# Patient Record
Sex: Male | Born: 1945
Health system: Southern US, Community
[De-identification: ages and names within clinical notes are randomized; demographics above are authoritative.]

## PROBLEM LIST (undated history)

## (undated) DIAGNOSIS — R0600 Dyspnea, unspecified: Secondary | ICD-10-CM

## (undated) DIAGNOSIS — F4321 Adjustment disorder with depressed mood: Secondary | ICD-10-CM

## (undated) DIAGNOSIS — F17200 Nicotine dependence, unspecified, uncomplicated: Secondary | ICD-10-CM

## (undated) DIAGNOSIS — F329 Major depressive disorder, single episode, unspecified: Secondary | ICD-10-CM

## (undated) DIAGNOSIS — R0609 Other forms of dyspnea: Secondary | ICD-10-CM

## (undated) DIAGNOSIS — I714 Abdominal aortic aneurysm, without rupture, unspecified: Secondary | ICD-10-CM

## (undated) DIAGNOSIS — F32A Depression, unspecified: Secondary | ICD-10-CM

## (undated) DIAGNOSIS — J45909 Unspecified asthma, uncomplicated: Secondary | ICD-10-CM

## (undated) DIAGNOSIS — E785 Hyperlipidemia, unspecified: Secondary | ICD-10-CM

## (undated) DIAGNOSIS — I219 Acute myocardial infarction, unspecified: Secondary | ICD-10-CM

## (undated) DIAGNOSIS — M1990 Unspecified osteoarthritis, unspecified site: Secondary | ICD-10-CM

## (undated) DIAGNOSIS — I2510 Atherosclerotic heart disease of native coronary artery without angina pectoris: Secondary | ICD-10-CM

## (undated) DIAGNOSIS — K635 Polyp of colon: Secondary | ICD-10-CM

## (undated) DIAGNOSIS — I10 Essential (primary) hypertension: Secondary | ICD-10-CM

## (undated) DIAGNOSIS — F528 Other sexual dysfunction not due to a substance or known physiological condition: Secondary | ICD-10-CM

## (undated) HISTORY — DX: Polyp of colon: K63.5

## (undated) HISTORY — DX: Hyperlipidemia, unspecified: E78.5

## (undated) HISTORY — DX: Other sexual dysfunction not due to a substance or known physiological condition: F52.8

## (undated) HISTORY — DX: Other forms of dyspnea: R06.09

## (undated) HISTORY — DX: Unspecified asthma, uncomplicated: J45.909

## (undated) HISTORY — DX: Major depressive disorder, single episode, unspecified: F32.9

## (undated) HISTORY — DX: Adjustment disorder with depressed mood: F43.21

## (undated) HISTORY — DX: Abdominal aortic aneurysm, without rupture, unspecified: I71.40

## (undated) HISTORY — DX: Nicotine dependence, unspecified, uncomplicated: F17.200

## (undated) HISTORY — DX: Abdominal aortic aneurysm, without rupture: I71.4

## (undated) HISTORY — DX: Atherosclerotic heart disease of native coronary artery without angina pectoris: I25.10

## (undated) HISTORY — DX: Dyspnea, unspecified: R06.00

## (undated) HISTORY — DX: Essential (primary) hypertension: I10

## (undated) HISTORY — DX: Depression, unspecified: F32.A

## (undated) HISTORY — PX: COLONOSCOPY: SHX174

---

## 1983-11-16 HISTORY — PX: INGUINAL HERNIA REPAIR: SHX194

## 1989-11-15 HISTORY — PX: CORONARY ARTERY BYPASS GRAFT: SHX141

## 2004-11-15 ENCOUNTER — Encounter: Payer: Self-pay | Admitting: Family Medicine

## 2004-11-15 LAB — FECAL OCCULT BLOOD, GUAIAC: Fecal Occult Blood: NEGATIVE

## 2004-11-15 LAB — CONVERTED CEMR LAB

## 2005-05-21 ENCOUNTER — Ambulatory Visit: Payer: Self-pay | Admitting: Family Medicine

## 2005-05-28 ENCOUNTER — Ambulatory Visit: Payer: Self-pay | Admitting: Internal Medicine

## 2005-07-16 ENCOUNTER — Ambulatory Visit: Payer: Self-pay | Admitting: Family Medicine

## 2005-08-24 ENCOUNTER — Ambulatory Visit: Payer: Self-pay | Admitting: Family Medicine

## 2005-09-18 ENCOUNTER — Ambulatory Visit: Payer: Self-pay | Admitting: Family Medicine

## 2005-09-28 ENCOUNTER — Ambulatory Visit: Payer: Self-pay | Admitting: Family Medicine

## 2006-08-01 ENCOUNTER — Ambulatory Visit: Payer: Self-pay | Admitting: Family Medicine

## 2006-08-08 ENCOUNTER — Ambulatory Visit: Payer: Self-pay | Admitting: Internal Medicine

## 2006-08-18 ENCOUNTER — Ambulatory Visit: Payer: Self-pay | Admitting: Family Medicine

## 2006-08-25 ENCOUNTER — Ambulatory Visit: Payer: Self-pay | Admitting: Family Medicine

## 2007-05-16 ENCOUNTER — Ambulatory Visit: Payer: Self-pay | Admitting: Family Medicine

## 2007-07-10 ENCOUNTER — Encounter: Payer: Self-pay | Admitting: Family Medicine

## 2007-07-10 DIAGNOSIS — F528 Other sexual dysfunction not due to a substance or known physiological condition: Secondary | ICD-10-CM

## 2007-07-10 DIAGNOSIS — I10 Essential (primary) hypertension: Secondary | ICD-10-CM

## 2007-07-10 DIAGNOSIS — I2510 Atherosclerotic heart disease of native coronary artery without angina pectoris: Secondary | ICD-10-CM

## 2007-07-10 DIAGNOSIS — E785 Hyperlipidemia, unspecified: Secondary | ICD-10-CM

## 2007-07-10 HISTORY — DX: Atherosclerotic heart disease of native coronary artery without angina pectoris: I25.10

## 2007-07-10 HISTORY — DX: Other sexual dysfunction not due to a substance or known physiological condition: F52.8

## 2007-07-10 HISTORY — DX: Essential (primary) hypertension: I10

## 2007-07-10 HISTORY — DX: Hyperlipidemia, unspecified: E78.5

## 2007-08-14 ENCOUNTER — Ambulatory Visit: Payer: Self-pay | Admitting: Family Medicine

## 2007-08-14 LAB — CONVERTED CEMR LAB
ALT: 31 units/L (ref 0–53)
AST: 24 units/L (ref 0–37)
Basophils Relative: 0.9 % (ref 0.0–1.0)
Bilirubin, Direct: 0.2 mg/dL (ref 0.0–0.3)
CO2: 27 meq/L (ref 19–32)
Calcium: 9.2 mg/dL (ref 8.4–10.5)
Eosinophils Relative: 6.6 % — ABNORMAL HIGH (ref 0.0–5.0)
GFR calc Af Amer: 98 mL/min
Glucose, Bld: 88 mg/dL (ref 70–99)
HCT: 45.5 % (ref 39.0–52.0)
Hemoglobin: 15.6 g/dL (ref 13.0–17.0)
Lymphocytes Relative: 29.5 % (ref 12.0–46.0)
Neutro Abs: 3.6 10*3/uL (ref 1.4–7.7)
Neutrophils Relative %: 53.4 % (ref 43.0–77.0)
Platelets: 134 10*3/uL — ABNORMAL LOW (ref 150–400)
Sodium: 142 meq/L (ref 135–145)
Total Protein: 5.7 g/dL — ABNORMAL LOW (ref 6.0–8.3)
Triglycerides: 60 mg/dL (ref 0–149)
VLDL: 12 mg/dL (ref 0–40)
WBC: 6.8 10*3/uL (ref 4.5–10.5)

## 2007-08-21 ENCOUNTER — Ambulatory Visit: Payer: Self-pay | Admitting: Family Medicine

## 2007-09-05 ENCOUNTER — Ambulatory Visit: Payer: Self-pay | Admitting: Internal Medicine

## 2007-10-03 ENCOUNTER — Telehealth: Payer: Self-pay | Admitting: Family Medicine

## 2007-10-03 ENCOUNTER — Telehealth: Payer: Self-pay | Admitting: Internal Medicine

## 2007-10-11 ENCOUNTER — Telehealth: Payer: Self-pay | Admitting: Family Medicine

## 2007-10-18 ENCOUNTER — Telehealth: Payer: Self-pay | Admitting: Family Medicine

## 2007-10-20 ENCOUNTER — Telehealth: Payer: Self-pay | Admitting: Family Medicine

## 2007-11-20 ENCOUNTER — Telehealth (INDEPENDENT_AMBULATORY_CARE_PROVIDER_SITE_OTHER): Payer: Self-pay | Admitting: *Deleted

## 2007-11-22 ENCOUNTER — Ambulatory Visit (HOSPITAL_BASED_OUTPATIENT_CLINIC_OR_DEPARTMENT_OTHER): Admission: RE | Admit: 2007-11-22 | Discharge: 2007-11-22 | Payer: Self-pay | Admitting: Urology

## 2008-04-17 ENCOUNTER — Encounter: Payer: Self-pay | Admitting: Family Medicine

## 2008-05-29 ENCOUNTER — Telehealth: Payer: Self-pay | Admitting: *Deleted

## 2008-09-06 ENCOUNTER — Telehealth: Payer: Self-pay | Admitting: Family Medicine

## 2008-09-10 ENCOUNTER — Ambulatory Visit: Payer: Self-pay | Admitting: Family Medicine

## 2008-09-10 DIAGNOSIS — J45909 Unspecified asthma, uncomplicated: Secondary | ICD-10-CM | POA: Insufficient documentation

## 2008-09-10 HISTORY — DX: Unspecified asthma, uncomplicated: J45.909

## 2008-09-10 LAB — CONVERTED CEMR LAB
AST: 20 units/L (ref 0–37)
Albumin: 3.5 g/dL (ref 3.5–5.2)
BUN: 16 mg/dL (ref 6–23)
Basophils Relative: 0.9 % (ref 0.0–3.0)
Creatinine, Ser: 1.1 mg/dL (ref 0.4–1.5)
Eosinophils Absolute: 0.4 10*3/uL (ref 0.0–0.7)
Eosinophils Relative: 5.8 % — ABNORMAL HIGH (ref 0.0–5.0)
GFR calc Af Amer: 88 mL/min
GFR calc non Af Amer: 72 mL/min
Glucose, Urine, Semiquant: NEGATIVE
HCT: 44.4 % (ref 39.0–52.0)
Hemoglobin: 15.2 g/dL (ref 13.0–17.0)
MCV: 95.6 fL (ref 78.0–100.0)
Monocytes Absolute: 0.6 10*3/uL (ref 0.1–1.0)
PSA: 0.85 ng/mL (ref 0.10–4.00)
Protein, U semiquant: NEGATIVE
RBC: 4.65 M/uL (ref 4.22–5.81)
Total Protein: 6.1 g/dL (ref 6.0–8.3)
Urobilinogen, UA: 0.2
WBC Urine, dipstick: NEGATIVE
WBC: 7 10*3/uL (ref 4.5–10.5)

## 2008-09-16 ENCOUNTER — Ambulatory Visit: Payer: Self-pay | Admitting: Family Medicine

## 2008-10-17 ENCOUNTER — Encounter: Payer: Self-pay | Admitting: Family Medicine

## 2008-10-17 ENCOUNTER — Ambulatory Visit: Payer: Self-pay | Admitting: Internal Medicine

## 2008-10-22 ENCOUNTER — Encounter: Payer: Self-pay | Admitting: Internal Medicine

## 2008-10-29 ENCOUNTER — Ambulatory Visit: Payer: Self-pay | Admitting: Internal Medicine

## 2008-11-25 ENCOUNTER — Encounter: Payer: Self-pay | Admitting: Internal Medicine

## 2008-11-25 ENCOUNTER — Ambulatory Visit: Payer: Self-pay | Admitting: Internal Medicine

## 2008-11-25 LAB — HM COLONOSCOPY

## 2008-11-28 ENCOUNTER — Encounter: Payer: Self-pay | Admitting: Internal Medicine

## 2009-01-23 ENCOUNTER — Encounter: Admission: RE | Admit: 2009-01-23 | Discharge: 2009-03-13 | Payer: Self-pay | Admitting: Orthopedic Surgery

## 2009-03-04 ENCOUNTER — Ambulatory Visit: Payer: Self-pay | Admitting: Family Medicine

## 2009-03-04 DIAGNOSIS — F4321 Adjustment disorder with depressed mood: Secondary | ICD-10-CM

## 2009-03-04 HISTORY — DX: Adjustment disorder with depressed mood: F43.21

## 2009-10-20 ENCOUNTER — Telehealth: Payer: Self-pay | Admitting: Family Medicine

## 2009-11-15 HISTORY — PX: HYDROCELE EXCISION / REPAIR: SUR1145

## 2009-11-27 ENCOUNTER — Telehealth (INDEPENDENT_AMBULATORY_CARE_PROVIDER_SITE_OTHER): Payer: Self-pay | Admitting: *Deleted

## 2009-12-04 ENCOUNTER — Ambulatory Visit: Payer: Self-pay | Admitting: Family Medicine

## 2009-12-04 LAB — CONVERTED CEMR LAB
AST: 18 units/L (ref 0–37)
Alkaline Phosphatase: 70 units/L (ref 39–117)
BUN: 13 mg/dL (ref 6–23)
Basophils Absolute: 0.1 10*3/uL (ref 0.0–0.1)
Bilirubin, Direct: 0.2 mg/dL (ref 0.0–0.3)
Calcium: 9.2 mg/dL (ref 8.4–10.5)
Cholesterol: 130 mg/dL (ref 0–200)
GFR calc non Af Amer: 59.36 mL/min (ref >60)
Lymphocytes Relative: 27.1 % (ref 12.0–46.0)
Monocytes Relative: 9.1 % (ref 3.0–12.0)
Platelets: 130 10*3/uL — ABNORMAL LOW (ref 150.0–400.0)
Potassium: 4 meq/L (ref 3.5–5.1)
RDW: 12.5 % (ref 11.5–14.6)
Sodium: 141 meq/L (ref 135–145)
Specific Gravity, Urine: 1.015 (ref 1.000–1.030)
Total Bilirubin: 1 mg/dL (ref 0.3–1.2)
Total CHOL/HDL Ratio: 5
Total Protein, Urine: NEGATIVE mg/dL
Urine Glucose: NEGATIVE mg/dL
Urobilinogen, UA: 0.2 (ref 0.0–1.0)
VLDL: 21 mg/dL (ref 0.0–40.0)
WBC: 7.3 10*3/uL (ref 4.5–10.5)
pH: 6 (ref 5.0–8.0)

## 2009-12-15 ENCOUNTER — Ambulatory Visit: Payer: Self-pay | Admitting: Family Medicine

## 2009-12-15 DIAGNOSIS — F17200 Nicotine dependence, unspecified, uncomplicated: Secondary | ICD-10-CM

## 2009-12-15 HISTORY — DX: Nicotine dependence, unspecified, uncomplicated: F17.200

## 2009-12-23 ENCOUNTER — Telehealth (INDEPENDENT_AMBULATORY_CARE_PROVIDER_SITE_OTHER): Payer: Self-pay | Admitting: *Deleted

## 2009-12-24 ENCOUNTER — Encounter (HOSPITAL_COMMUNITY): Admission: RE | Admit: 2009-12-24 | Discharge: 2010-02-18 | Payer: Self-pay | Admitting: Family Medicine

## 2009-12-24 ENCOUNTER — Ambulatory Visit: Payer: Self-pay

## 2009-12-24 ENCOUNTER — Ambulatory Visit: Payer: Self-pay | Admitting: Cardiovascular Disease

## 2009-12-30 ENCOUNTER — Ambulatory Visit: Payer: Self-pay | Admitting: Cardiology

## 2010-01-12 ENCOUNTER — Ambulatory Visit: Payer: Self-pay | Admitting: Family Medicine

## 2010-03-12 ENCOUNTER — Ambulatory Visit: Payer: Self-pay | Admitting: Family Medicine

## 2010-04-22 ENCOUNTER — Telehealth: Payer: Self-pay | Admitting: Family Medicine

## 2010-05-22 ENCOUNTER — Ambulatory Visit: Payer: Self-pay | Admitting: Family Medicine

## 2010-12-09 ENCOUNTER — Other Ambulatory Visit: Payer: Self-pay | Admitting: Family Medicine

## 2010-12-09 ENCOUNTER — Ambulatory Visit
Admission: RE | Admit: 2010-12-09 | Discharge: 2010-12-09 | Payer: Self-pay | Source: Home / Self Care | Attending: Family Medicine | Admitting: Family Medicine

## 2010-12-09 LAB — LIPID PANEL
HDL: 27.6 mg/dL — ABNORMAL LOW (ref >39.00)
Total CHOL/HDL Ratio: 4
Triglycerides: 78 mg/dL (ref 0.0–149.0)

## 2010-12-09 LAB — CBC WITH DIFFERENTIAL/PLATELET
Basophils Absolute: 0.1 10*3/uL (ref 0.0–0.1)
Basophils Relative: 0.9 % (ref 0.0–3.0)
Eosinophils Absolute: 0.3 10*3/uL (ref 0.0–0.7)
Lymphocytes Relative: 28.7 % (ref 12.0–46.0)
MCHC: 34.3 g/dL (ref 30.0–36.0)
Neutrophils Relative %: 57.2 % (ref 43.0–77.0)
RBC: 4.59 Mil/uL (ref 4.22–5.81)
RDW: 13 % (ref 11.5–14.6)

## 2010-12-09 LAB — BASIC METABOLIC PANEL
CO2: 29 mEq/L (ref 19–32)
Calcium: 8.9 mg/dL (ref 8.4–10.5)
Creatinine, Ser: 1.2 mg/dL (ref 0.4–1.5)

## 2010-12-09 LAB — URINALYSIS
Specific Gravity, Urine: 1.02 (ref 1.000–1.030)
Total Protein, Urine: NEGATIVE
Urine Glucose: NEGATIVE

## 2010-12-09 LAB — HEPATIC FUNCTION PANEL
Alkaline Phosphatase: 70 U/L (ref 39–117)
Bilirubin, Direct: 0.1 mg/dL (ref 0.0–0.3)

## 2010-12-09 LAB — PSA: PSA: 1.12 ng/mL (ref 0.10–4.00)

## 2010-12-12 ENCOUNTER — Encounter: Payer: Self-pay | Admitting: Family Medicine

## 2010-12-16 NOTE — Progress Notes (Signed)
Summary: celexa and cardura refill   Phone Note Refill Request Message from:  Patient on November 27, 2009 2:03 PM  Refills Requested: Medication #1:  CELEXA 40 MG  TABS 1 &1/2 by mouth at bedtime  Medication #2:  CARDURA 8 MG  TABS Take 1 tablet by mouth at bedtime [facility]  Initial call taken by: Doristine Devoid,  November 27, 2009 2:03 PM  Follow-up for Phone Call        patient has pending appt scheduled for cpx and labs.........Marland KitchenDoristine Devoid  November 27, 2009 2:04 PM     Prescriptions: CELEXA 40 MG  TABS (CITALOPRAM HYDROBROMIDE) 1 &1/2 by mouth at bedtime  #45 x 0   Entered by:   Doristine Devoid   Authorized by:   Roderick Pee MD   Signed by:   Doristine Devoid on 11/27/2009   Method used:   Electronically to        [facility] [phone][facility] (retail)       [address]       [place], [place]  [address]       Ph: [phone]       Fax: [phone]   RxID:   [id number] CARDURA 8 MG  TABS (DOXAZOSIN MESYLATE) Take 1 tablet by mouth at bedtime  #30 x 0   Entered by:   Doristine Devoid   Authorized by:   Roderick Pee MD   Signed by:   Doristine Devoid on 11/27/2009   Method used:   Electronically to        [facility] [phone]* (retail)       [address]       [place], [place]  [address]       Ph: [phone]       Fax: [phone]   RxID:   [id number]

## 2010-12-16 NOTE — Assessment & Plan Note (Signed)
Summary: Cardiolgy Nuclear Study  Nuclear Med Background Indications for Stress Test: Evaluation for Ischemia, Graft Patency   History: CABG, GXT   Symptoms: DOE    Nuclear Pre-Procedure Cardiac Risk Factors: Hypertension, Lipids, Smoker Caffeine/Decaff Intake: None NPO After: 9:00 PM Lungs: clear IV 0.9% NS with Angio Cath: 22g     IV Site: (R) AC IV Started by: Irean Hong RN Chest Size (in) 48     Height (in): 69 Weight (lb): 237 BMI: 35.13  Nuclear Med Study 1 or 2 day study:  1 day     Stress Test Type:  Stress Reading MD:  Charlton Haws, MD     Referring MD:  J.Todd Resting Radionuclide:  Technetium 15m Tetrofosmin     Resting Radionuclide Dose:  10.0 mCi  Stress Radionuclide:  Technetium 55m Tetrofosmin     Stress Radionuclide Dose:  33.0 mCi   Stress Protocol Exercise Time (min):  9:15 min     Max HR:  153 bpm     Predicted Max HR:  158 bpm  Max Systolic BP: 158 mm Hg     Percent Max HR:  96.84 %Rate Pressure Product:  78469    Stress Test Technologist:  Milana Na EMT-P     Nuclear Technologist:  Burna Mortimer Deal RT-N  Rest Procedure  Myocardial perfusion imaging was performed at rest 45 minutes following the intravenous administration of Myoview Technetium 70m Tetrofosmin.  Stress Procedure  The patient exercised for 9:15  The patient stopped due to fatigue, sob, and denied any chest pain.  There were no significant ST-T wave changes.  Myoview was injected at peak exercise and myocardial perfusion imaging was performed after a brief delay.  QPS Raw Data Images:  Normal; no motion artifact; normal heart/lung ratio. Stress Images:  NI: Uniform and normal uptake of tracer in all myocardial segments. Rest Images:  Normal homogeneous uptake in all areas of the myocardium. Subtraction (SDS):  Normal Transient Ischemic Dilatation:  .75  (Normal <1.22)  Lung/Heart Ratio:  .31  (Normal <0.45)  Quantitative Gated Spect Images QGS EDV:  62 ml QGS ESV:  20 ml QGS  EF:  67 % QGS cine images:  Normal  Findings Normal nuclear study      Overall Impression  Exercise Capacity: Good exercise capacity. BP Response: Normal blood pressure response. Clinical Symptoms: Dyspnea ECG Impression: No significant ST segment change suggestive of ischemia. Overall Impression: Normal stress nuclear study. Overall Impression Comments: Normal

## 2010-12-16 NOTE — Assessment & Plan Note (Signed)
Summary: 1 month rov/njr   Vital Signs:  Patient profile:   65 year old male Weight:      241 pounds BMI:     35.72 Temp:     98.7 degrees F oral BP sitting:   126 / 68  (left arm) Cuff size:   regular  Vitals Entered By: Raechel Ache, RN (January 12, 2010 10:13 AM) CC: 1 mo ROV- saw cardiologist; had nuclear stress test and results ok.   Primary Care Provider:  Dr. Tawanna Cooler  CC:  1 mo ROV- saw cardiologist; had nuclear stress test and results ok..  History of Present Illness: Dave Robles is a 65 year old, married male, nonsmoker, who comes in today for evaluation of two issues.  He recently saw his cardiologist, who recommended he switch from 80 mg of Lipitor to 40 mg of Crestor.  The reason being his HDL is 28.  We discussed the pros and cons of switching medication.  Patient would like to try the Crestor.  Advised if he needs a prior approval.  Call the cardiologist, who read the original prescription.   Celexa 60 mg is not working.  He wakes up after 3 to 4 hours of sleeping can go back to sleep.  In the, past.  She's taken Paxil, with no significant side effects.  We discussed various options, including medication trials, versus consult with Dr. Rolly Pancake .  he continues to take the chantix  with decrease in his smoking down to 10 cigarettes a day.  He would like to quit completely red.  I outlined a program for him  Allergies: 1)  ! * Hydromet  Past History:  Past medical, surgical, family and social histories (including risk factors) reviewed for relevance to current acute and chronic problems.  Past Medical History: Reviewed history from 12/30/2009 and no changes required. 1. HYPERTENSION (ICD-401.9) 2. HYPERLIPIDEMIA (ICD-272.4) 3. CORONARY ARTERY DISEASE (ICD-414.00): CABG 1991, Dr. Andrey Campanile.  ETT-myoview (2/11): 9'15", EF 67%, no ischemia or infarction.  4. EXTRINSIC ASTHMA, UNSPECIFIED (ICD-493.00) 5. PNEUMONIA DUE TO MYCOPLASMA PNEUMONIAE (ICD-483.0) 6. ERECTILE  DYSFUNCTION (ICD-302.72) 7. TOBACCO USE (ICD-305.1) 8. ADJUSTMENT DISORDER WITH DEPRESSED MOOD (ICD-309.0)  Past Surgical History: Reviewed history from 07/10/2007 and no changes required. Coronary artery bypass graft  1991 Inguinal herniorrhaphy  Family History: Reviewed history from 12/30/2009 and no changes required. Family History High cholesterol Family History Hypertension Father with CABG in his 48s  Social History: Reviewed history from 12/30/2009 and no changes required. 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  Alcohol use-no Drug use-no Regular exercise-yes, 1/2 ppd.  Current Smoker  Review of Systems      See HPI  Physical Exam  General:  Well-developed,well-nourished,in no acute distress; alert,appropriate and cooperative throughout examination Psych:  Cognition and judgment appear intact. Alert and cooperative with normal attention span and concentration. No apparent delusions, illusions, hallucinations   Impression & Recommendations:  Problem # 1:  ADJUSTMENT DISORDER WITH DEPRESSED MOOD (ICD-309.0) Assessment Unchanged  Orders: Prescription Created Electronically [id number])  Complete Medication List: 1)  Aspirin 81 Mg Tabs (Aspirin) .Marland Kitchen.. 1 by mouth daily 2)  Multivitamins Caps (Multiple vitamin) .... Once daily 3)  Cardura 8 Mg Tabs (Doxazosin mesylate) .... Take 1 tablet by mouth at bedtime 4)  Crestor 40 Mg Tabs (Rosuvastatin calcium) .... One tablet 5)  Zestoretic 20-12.5 Mg Tabs (Lisinopril-hydrochlorothiazide) .... Take 1 tablet by mouth once a day 6)  Nitroglycerin 0.4 Mg Subl (Nitroglycerin) .... As needed 7)  Viagra 100 Mg Tabs (Sildenafil  citrate) .... Uad 8)  Chantix Starting Month Pak 0.5 Mg X 11 & 1 Mg X 42 Tabs (Varenicline tartrate) .... Uad 9)  Chantix Continuing Month Pak 1 Mg Tabs (Varenicline tartrate) .... Uad 10)  Paxil 20 Mg Tabs (Paroxetine hcl) .Marland Kitchen.. 1 tab @ bedtime  Other Orders: Tobacco use cessation  intermediate 3-10 minutes ([address])  Patient Instructions: 1)  stop the Celexa begin Paxil 20 mg nightly return in 4 weeks for follow-up Prescriptions: PAXIL 20 MG TABS (PAROXETINE HCL) 1 tab @ bedtime  #30 x 1   Entered and Authorized by:   Roderick Pee MD   Signed by:   Roderick Pee MD on 01/12/2010   Method used:   Electronically to        [facility] [phone]* (retail)       [address]       [place], [place]  [address]       Ph: [phone]       Fax: [phone]   RxID:   [id number]

## 2010-12-16 NOTE — Assessment & Plan Note (Signed)
Summary: CPX // RS   Vital Signs:  Patient profile:   65 year old male Height:      69 inches Weight:      240 pounds BMI:     35.57 Temp:     98.7 degrees F oral BP sitting:   124 / 84  (left arm) Cuff size:   regular  Vitals Entered By: Kern Reap CMA Duncan Dull) (December 15, 2009 2:13 PM)  Reason for Visit cpx  History of Present Illness: Dave Robles is a 65 year old, married male, smoker, one half pack per day.......... he quit for many years after his bypass surgery 18 years ago, then restarted in the past year........Marland Kitchen requesting help to quit;;;;;;;; who comes in today for physical examination because of underlying issues.  He does have a history of coronary disease had bypass surgery 18 years ago.  His been well since that time with no recurrence of chest pain.  Two years ago.  We did a stress test that was normal.  He is requesting another stress test and a cardiology consult.  We will get this set up for him.  He is up to smoking a half a pack of cigarettes a day.  We discussed various options.  We will start him on the chantix program.  His underlying hyperlipidemia, for which he takes Lipitor 80 mg nightly, LDL is 81.  He takes Cardura 8 mg nightly and Zestoretic 20 -- 12.5 daily for hypertension.  BP 124/84.  He uses Viagra 100 mg p.r.n. for ED.  He also takes Celexa 60 mg nightly for mild depression.  He's feeling well.  Work isok   He gets routine eye care.  Dental care.  Colonoscopy done two years ago, normal.  Tetanus 2008 Pneumovax 2005 .Marland Kitchen... Will give booster today....... seasonal flu 2010  He also takes an aspirin daily.  Preventive Screening-Counseling & Management  Alcohol-Tobacco     Smoking Status: current  Allergies: 1)  ! * Hydromet  Past History:  Past medical, surgical, family and social histories (including risk factors) reviewed for relevance to current acute and chronic problems. Past medical, surgical, family and social histories (including risk  factors) reviewed, and no changes noted (except as noted below).  Past Medical History: Reviewed history from 07/10/2007 and no changes required. Coronary artery disease Hyperlipidemia Hypertension erectile dysfunction  Past Surgical History: Reviewed history from 07/10/2007 and no changes required. Coronary artery bypass graft  1991 Inguinal herniorrhaphy  Family History: Reviewed history from 09/16/2008 and no changes required. Family History High cholesterol Family History Hypertension  Social History: Reviewed history from 09/10/2008 and no changes required. Occupation: Married Alcohol use-no Drug use-no Regular exercise-yes Current Smoker Smoking Status:  current  Review of Systems      See HPI  Physical Exam  General:  Well-developed,well-nourished,in no acute distress; alert,appropriate and cooperative throughout examination Head:  Normocephalic and atraumatic without obvious abnormalities. No apparent alopecia or balding. Eyes:  No corneal or conjunctival inflammation noted. EOMI. Perrla. Funduscopic exam benign, without hemorrhages, exudates or papilledema. Vision grossly normal. Ears:  External ear exam shows no significant lesions or deformities.  Otoscopic examination reveals clear canals, tympanic membranes are intact bilaterally without bulging, retraction, inflammation or discharge. Hearing is grossly normal bilaterally. Nose:  External nasal examination shows no deformity or inflammation. Nasal mucosa are pink and moist without lesions or exudates. Mouth:  Oral mucosa and oropharynx without lesions or exudates.  Teeth in good repair. Neck:  No deformities, masses, or tenderness noted..........no bruits  Chest Wall:  No deformities, masses, tenderness or gynecomastia noted. Breasts:  No masses or gynecomastia noted Lungs:  Normal respiratory effort, chest expands symmetrically. Lungs are clear to auscultation, no crackles or wheezes. Heart:  Normal rate and  regular rhythm. S1 and S2 normal without gallop, murmur, click, rub or other extra sounds. Abdomen:  Bowel sounds positive,abdomen soft and non-tender without masses, organomegaly or hernias noted. Rectal:  No external abnormalities noted. Normal sphincter tone. No rectal masses or tenderness. Genitalia:  Testes bilaterally descended without nodularity, tenderness or masses. No scrotal masses or lesions. No penis lesions or urethral discharge. Prostate:  no nodules, no asymmetry, and 1+ enlarged.   Msk:  No deformity or scoliosis noted of thoracic or lumbar spine.   Pulses:  R and L carotid,radial,femoral,dorsalis pedis and posterior tibial pulses are full and equal bilaterally Extremities:  No clubbing, cyanosis, edema, or deformity noted with normal full range of motion of all joints.   Neurologic:  No cranial nerve deficits noted. Station and gait are normal. Plantar reflexes are down-going bilaterally. DTRs are symmetrical throughout. Sensory, motor and coordinative functions appear intact. Skin:  total body skin exam normal except for a scar lower left leg from previous vein harvest Cervical Nodes:  No lymphadenopathy noted Axillary Nodes:  No palpable lymphadenopathy Inguinal Nodes:  No significant adenopathy Psych:  Cognition and judgment appear intact. Alert and cooperative with normal attention span and concentration. No apparent delusions, illusions, hallucinations   Problems:  Medical Problems Added: 1)  Dx of Tobacco Use  (ICD-305.1)  Impression & Recommendations:  Problem # 1:  TOBACCO USE (ICD-305.1) Assessment New  His updated medication list for this problem includes:    Chantix Starting Month Pak 0.5 Mg X 11 & 1 Mg X 42 Tabs (Varenicline tartrate) ..... Uad    Chantix Continuing Month Pak 1 Mg Tabs (Varenicline tartrate) ..... Uad  Orders: Prescription Created Electronically [phone]) Tobacco use cessation intermediate 3-10 minutes (60454)  Problem # 2:  ADJUSTMENT  DISORDER WITH DEPRESSED MOOD (ICD-309.0) Assessment: Improved  Orders: Prescription Created Electronically [phone]) Tobacco use cessation intermediate 3-10 minutes (91478)  Problem # 3:  ERECTILE DYSFUNCTION (ICD-302.72) Assessment: Improved  His updated medication list for this problem includes:    Viagra 100 Mg Tabs (Sildenafil citrate) ..... Uad  Orders: Prescription Created Electronically [phone]) Tobacco use cessation intermediate 3-10 minutes (13086)  Problem # 4:  HYPERTENSION (ICD-401.9) Assessment: Improved  His updated medication list for this problem includes:    Cardura 8 Mg Tabs (Doxazosin mesylate) .Marland Kitchen... Take 1 tablet by mouth at bedtime    Zestoretic 20-12.5 Mg Tabs (Lisinopril-hydrochlorothiazide) .Marland Kitchen... Take 1 tablet by mouth once a day  Orders: Prescription Created Electronically [phone]) Tobacco use cessation intermediate 3-10 minutes (99406) EKG w/ Interpretation (93000)  Problem # 5:  HYPERLIPIDEMIA (ICD-272.4) Assessment: Improved  His updated medication list for this problem includes:    Lipitor 80 Mg Tabs (Atorvastatin calcium) .Marland Kitchen... Take 1 tablet by mouth at bedtime  Orders: Prescription Created Electronically [phone]) Tobacco use cessation intermediate 3-10 minutes (99406) EKG w/ Interpretation (93000)  Problem # 6:  CORONARY ARTERY DISEASE (ICD-414.00) Assessment: Unchanged  His updated medication list for this problem includes:    Aspirin 325 Mg Tbec (Aspirin) ..... Once daily    Cardura 8 Mg Tabs (Doxazosin mesylate) .Marland Kitchen... Take 1 tablet by mouth at bedtime    Zestoretic 20-12.5 Mg Tabs (Lisinopril-hydrochlorothiazide) .Marland Kitchen... Take 1 tablet by mouth once a day    Nitroglycerin 0.4 Mg Subl (Nitroglycerin) .Marland KitchenMarland KitchenMarland KitchenMarland Kitchen  As needed  Orders: Cardiology Referral (Cardiology) Prescription Created Electronically [phone]) Tobacco use cessation intermediate 3-10 minutes (60454)  Complete Medication List: 1)  Aspirin 325 Mg Tbec (Aspirin) .... Once daily 2)   Multivitamins Caps (Multiple vitamin) .... Once daily 3)  Cardura 8 Mg Tabs (Doxazosin mesylate) .... Take 1 tablet by mouth at bedtime 4)  Lipitor 80 Mg Tabs (Atorvastatin calcium) .... Take 1 tablet by mouth at bedtime 5)  Zestoretic 20-12.5 Mg Tabs (Lisinopril-hydrochlorothiazide) .... Take 1 tablet by mouth once a day 6)  Celexa 40 Mg Tabs (Citalopram hydrobromide) .Marland Kitchen.. 1 &1/2 by mouth at bedtime 7)  Nitroglycerin 0.4 Mg Subl (Nitroglycerin) .... As needed 8)  Viagra 100 Mg Tabs (Sildenafil citrate) .... Uad 9)  Chantix Starting Month Pak 0.5 Mg X 11 & 1 Mg X 42 Tabs (Varenicline tartrate) .... Uad 10)  Chantix Continuing Month Pak 1 Mg Tabs (Varenicline tartrate) .... Uad  Other Orders: Pneumococcal Vaccine (09811) Admin 1st Vaccine (91478)  Patient Instructions: 1)  begin the chantix program that was outlined.  Return in 4 weeks for follow-up. 2)  We will get to set up for a stress test and then a cardiac consult with Dr. Shirlee Latch. 3)  Please schedule a follow-up appointment in 1 year. Prescriptions: VIAGRA 100 MG  TABS (SILDENAFIL CITRATE) UAD  #6 x 11   Entered and Authorized by:   Roderick Pee MD   Signed by:   Roderick Pee MD on 12/15/2009   Method used:   [facility]ically to        [facility] [phone]* (retail)       [address]       [place], [place]  [address]       Ph: [phone]       Fax: [phone]   RxID:   [id number] NITROGLYCERIN 0.4 MG  SUBL (NITROGLYCERIN) as needed  #25 x 1   Entered and Authorized by:   Roderick Pee MD   Signed by:   Roderick Pee MD on 12/15/2009   Method used:   Electronically to        [facility] [phone]* (retail)       [address]       [place], [place]  [address]       Ph: [phone]       Fax: [phone]   RxID:   [id number] CELEXA 40 MG  TABS (CITALOPRAM HYDROBROMIDE) 1 &1/2 by mouth at bedtime  #150 x 3   Entered and Authorized by:   Roderick Pee MD   Signed by:   Roderick Pee MD on 12/15/2009   Method used:   Electronically to        [facility] [phone]* (retail)       [address]       [place], [place]  [address]       Ph: [phone]       Fax: [phone]   RxID:   [id number] ZESTORETIC 20-12.5 MG  TABS (LISINOPRIL-HYDROCHLOROTHIAZIDE) Take 1 tablet by mouth once a day  #100 Tablet x 3   Entered and Authorized by:   Roderick Pee MD   Signed by:   Roderick Pee MD on 12/15/2009   Method used:   Electronically to  [facility] [phone]* (retail)       [address]       [place], [place]  [address]       Ph: [phone]       Fax: [phone]   RxID:   [id number] LIPITOR 80 MG  TABS (ATORVASTATIN CALCIUM) Take 1 tablet by mouth at bedtime  #100 x 3   Entered and Authorized by:   Roderick Pee MD   Signed by:   Roderick Pee MD on 12/15/2009   Method used:   Electronically to        [facility] [phone]* (retail)       [address]       [place], [place]  [address]       Ph: [phone]       Fax: [phone]   RxID:   [id number] CARDURA 8 MG  TABS (DOXAZOSIN MESYLATE) Take 1 tablet by mouth at bedtime  #100 x 3   Entered and Authorized by:   Roderick Pee MD   Signed by:   Roderick Pee MD on 12/15/2009   Method used:   Electronically to        [facility] [phone]* (retail)       [address]       [place], [place]  [address]       Ph: [phone]       Fax: [phone]   RxID:   [id number] CHANTIX CONTINUING MONTH PAK 1 MG TABS (VARENICLINE TARTRATE) UAD  #1 x 3   Entered and Authorized by:   Roderick Pee MD   Signed by:   Roderick Pee MD on 12/15/2009   Method used:   Electronically to        [facility] [phone]* (retail)       [address]       [place], [place]  [address]       Ph: [phone]       Fax:  [phone]   RxID:   [id number] CHANTIX STARTING MONTH PAK 0.5 MG X 11 & 1 MG X 42 TABS (VARENICLINE TARTRATE) UAD  #1 x 0   Entered and Authorized by:   Roderick Pee MD   Signed by:   Roderick Pee MD on 12/15/2009   Method used:   Electronically to        [facility] [phone]* (retail)       [address]       [place], [place]  [address]       Ph: [phone]       Fax: [phone]   RxID:   [id number]    Immunization History:  Influenza Immunization History:    Influenza:  historical (09/15/2009)  Immunizations Administered:  Pneumonia Vaccine:    Vaccine Type: Pneumovax    Site: left deltoid    Mfr: Merck    Dose: 0.5 ml    Route: IM    Given by: Kern Reap CMA (AAMA)    Exp. Date: 12/11/2010    Lot #: [id number]    Physician counseled: yes

## 2010-12-16 NOTE — Progress Notes (Signed)
Summary: Nuclear Pre-Procedure  Phone Note Outgoing Call   Call placed by: Milana Na, EMT-P,  December 23, 2009 5:00 PM Summary of Call: Reviewed information on Myoview Information Sheet (see scanned document for further details).  Spoke with patient.     Nuclear Med Background Indications for Stress Test: Evaluation for Ischemia, Graft Patency   History: CABG, GXT      Nuclear Pre-Procedure Cardiac Risk Factors: Hypertension, Lipids, Smoker Height (in): 69  Nuclear Med Study Referring MD:  J.Todd

## 2010-12-16 NOTE — Assessment & Plan Note (Signed)
Summary: fup on meds//ccm   Vital Signs:  Patient profile:   65 year old male Weight:      242 pounds Temp:     99.0 degrees F oral BP sitting:   120 / 72  (left arm) Cuff size:   regular  Vitals Entered By: Kathrynn Speed CMA (May 22, 2010 1:44 PM) CC: fu med zoloft, stilling not sleeping good / src   Primary Care Provider:  Dr. Tawanna Cooler  CC:  fu med zoloft and stilling not sleeping good / src.  History of Present Illness: Dave Robles is a 65 year old male, who comes in today for evaluation of two problems.  We put him on chantix for smoking cessation.  He, states he's off cigarettes completely and takes one chantix tablet daily.  No side effects from medication.  We discussed the data from the article about the negative effects of chantix.  He states he wishes to continue  We also start him on Zoloft 50 mg nightly.  He states he sleeping better and states he is about 50 to 60% improved.  We discussed for his options.  He likes to increase the dose slightly to 75 mg nightly.  Current Medications (verified): 1)  Aspirin 81 Mg  Tabs (Aspirin) .Marland Kitchen.. 1 By Mouth Daily 2)  Multivitamins   Caps (Multiple Vitamin) .... Once Daily 3)  Cardura 8 Mg  Tabs (Doxazosin Mesylate) .... Take 1 Tablet By Mouth At Bedtime 4)  Crestor 40 Mg Tabs (Rosuvastatin Calcium) .... One Tablet 5)  Zestoretic 20-12.5 Mg  Tabs (Lisinopril-Hydrochlorothiazide) .... Take 1 Tablet By Mouth Once A Day 6)  Nitroglycerin 0.4 Mg  Subl (Nitroglycerin) .... As Needed 7)  Viagra 100 Mg  Tabs (Sildenafil Citrate) .... Uad 8)  Chantix Continuing Month Pak 1 Mg Tabs (Varenicline Tartrate) .... Uad 9)  Zoloft 50 Mg Tabs (Sertraline Hcl) .... One By Mouth Daily  Allergies (verified): 1)  ! * Hydromet  Past History:  Past medical, surgical, family and social histories (including risk factors) reviewed for relevance to current acute and chronic problems.  Past Medical History: Reviewed history from 12/30/2009 and no changes  required. 1. HYPERTENSION (ICD-401.9) 2. HYPERLIPIDEMIA (ICD-272.4) 3. CORONARY ARTERY DISEASE (ICD-414.00): CABG 1991, Dr. Andrey Campanile.  ETT-myoview (2/11): 9'15", EF 67%, no ischemia or infarction.  4. EXTRINSIC ASTHMA, UNSPECIFIED (ICD-493.00) 5. PNEUMONIA DUE TO MYCOPLASMA PNEUMONIAE (ICD-483.0) 6. ERECTILE DYSFUNCTION (ICD-302.72) 7. TOBACCO USE (ICD-305.1) 8. ADJUSTMENT DISORDER WITH DEPRESSED MOOD (ICD-309.0)  Past Surgical History: Reviewed history from 07/10/2007 and no changes required. Coronary artery bypass graft  1991 Inguinal herniorrhaphy  Family History: Reviewed history from 12/30/2009 and no changes required. Family History High cholesterol Family History Hypertension Father with CABG in his 76s  Social History: Reviewed history from 12/30/2009 and no changes required. Occupation: Owns Engineer, agricultural business Married, lives in [place].  Alcohol use-no Drug use-no Regular exercise-yes, 1/2 ppd.  Current Smoker  Review of Systems      See HPI  Physical Exam  General:  Well-developed,well-nourished,in no acute distress; alert,appropriate and cooperative throughout examination Psych:  Cognition and judgment appear intact. Alert and cooperative with normal attention span and concentration. No apparent delusions, illusions, hallucinations   Impression & Recommendations:  Problem # 1:  TOBACCO USE (ICD-305.1) Assessment Improved  His updated medication list for this problem includes:    Chantix Continuing Month Pak 1 Mg Tabs (Varenicline tartrate) ..... Uad  Problem # 2:  ADJUSTMENT DISORDER WITH DEPRESSED MOOD (ICD-309.0) Assessment: Improved  Complete Medication List: 1)  Aspirin 81 Mg Tabs (Aspirin) .Marland Kitchen.. 1 by mouth daily 2)  Multivitamins Caps (Multiple vitamin) .... Once daily 3)  Cardura 8 Mg Tabs (Doxazosin mesylate) .... Take 1 tablet by mouth at bedtime 4)  Crestor 40 Mg Tabs (Rosuvastatin calcium) .... One tablet 5)  Zestoretic 20-12.5 Mg Tabs  (Lisinopril-hydrochlorothiazide) .... Take 1 tablet by mouth once a day 6)  Nitroglycerin 0.4 Mg Subl (Nitroglycerin) .... As needed 7)  Viagra 100 Mg Tabs (Sildenafil citrate) .... Uad 8)  Chantix Continuing Month Pak 1 Mg Tabs (Varenicline tartrate) .... Uad 9)  Zoloft 50 Mg Tabs (Sertraline hcl) .Marland Kitchen.. 1 & 1/2 at bedtime  Patient Instructions: 1)  decrease the chantix to one half tablet daily, x 6 weeks, then stop. 2)  Increase the Zoloft to 75 mg daily.  If you feel you need to increase the dose 1 00 mg.......... call ......... name and number and we can change y  prescription by phone. 3)  return in January for your annual exam Prescriptions: ZOLOFT 50 MG TABS (SERTRALINE HCL) 1 & 1/2 at bedtime  #150 x 3   Entered and Authorized by:   Roderick Pee MD   Signed by:   Roderick Pee MD on 05/22/2010   Method used:   [facility]        [facility] [phone][facility] (retail)       [address]       [place], [place]  [address]       Ph: [phone]       Fax: [phone]   RxID:   [id number]

## 2010-12-16 NOTE — Progress Notes (Signed)
Summary: diff med  Phone Note Call from Patient Call back at Work Phone [phone]   Summary of Call: Thinks the Paxil isn't working.  The increased dose didn't do much.  Another med to try?  Zoloft?    [facility]   Initial call taken by: Rudy Jew, RN,  April 22, 2010 3:50 PM  Follow-up for Phone Call        Zoloft 50 mg, dispensed 30 tablets, directions one nightly, refills x 1.  Follow-up office visit the first full week in July Follow-up by: Roderick Pee MD,  April 23, 2010 8:36 AM    New/Updated Medications: ZOLOFT 50 MG TABS (SERTRALINE HCL) one by mouth daily Prescriptions: ZOLOFT 50 MG TABS (SERTRALINE HCL) one by mouth daily  #30 x 1   Entered by:   Lynann Beaver CMA   Authorized by:   Roderick Pee MD   Signed by:   Lynann Beaver CMA on 04/23/2010   Method used:   Electronically to        [facility] [phone]* (retail)       [address]       [place], [place]  [address]       Ph: [phone]       Fax: [phone]   RxID:   [id number]  Pt notified, and appt scheduled.

## 2010-12-16 NOTE — Assessment & Plan Note (Signed)
Summary: np6/CAD/f/u from stress test   Primary Provider:  Dr. Tawanna Cooler   History of Present Illness: 65 yo now 20 years s/p CABG presents to establish cardiology followup.  Patient had ETT-myoview this month with good exercise tolerance and no ischemia or infarction.  He has been doing quite well in general.  No exertional shortness of breath or chest pain.  He exercises 30-40 minutes several times a week on an elliptical, plays tennis, and does yardwork without any problem.  His symptoms prior to CABG were severe dyspnea.  He quit smoking after his CABG, unfortunately he restarted smoking several years ago.  He has started taking Chantix but has not quit smoking. His HDL is low but he has not been able to tolerate Niaspan.    Labs (1/11): K 4, creatinine 1.3, LDL 81, HDL 28, LFTs normal, TSH normal  ECG: NSR, normal  Current Medications (verified): 1)  Aspirin 81 Mg  Tabs (Aspirin) .Marland Kitchen.. 1 By Mouth Daily 2)  Multivitamins   Caps (Multiple Vitamin) .... Once Daily 3)  Cardura 8 Mg  Tabs (Doxazosin Mesylate) .... Take 1 Tablet By Mouth At Bedtime 4)  Crestor 40 Mg Tabs (Rosuvastatin Calcium) .... One Tablet 5)  Zestoretic 20-12.5 Mg  Tabs (Lisinopril-Hydrochlorothiazide) .... Take 1 Tablet By Mouth Once A Day 6)  Celexa 40 Mg  Tabs (Citalopram Hydrobromide) .Marland Kitchen.. 1 &1/2 By Mouth At Bedtime 7)  Nitroglycerin 0.4 Mg  Subl (Nitroglycerin) .... As Needed 8)  Viagra 100 Mg  Tabs (Sildenafil Citrate) .... Uad 9)  Chantix Starting Month Pak 0.5 Mg X 11 & 1 Mg X 42 Tabs (Varenicline Tartrate) .... Uad 10)  Chantix Continuing Month Pak 1 Mg Tabs (Varenicline Tartrate) .... Uad  Allergies (verified): 1)  ! * Hydromet  Past History:  Past Medical History: 1. HYPERTENSION (ICD-401.9) 2. HYPERLIPIDEMIA (ICD-272.4) 3. CORONARY ARTERY DISEASE (ICD-414.00): CABG 1991, Dr. Andrey Campanile.  ETT-myoview (2/11): 9'15", EF 67%, no ischemia or infarction.  4. EXTRINSIC ASTHMA, UNSPECIFIED (ICD-493.00) 5. PNEUMONIA  DUE TO MYCOPLASMA PNEUMONIAE (ICD-483.0) 6. ERECTILE DYSFUNCTION (ICD-302.72) 7. TOBACCO USE (ICD-305.1) 8. ADJUSTMENT DISORDER WITH DEPRESSED MOOD (ICD-309.0)  Family History: Family History High cholesterol Family History Hypertension Father with CABG in his 42s  Social History: Occupation: Owns Engineer, agricultural business Married, lives in [place].  Alcohol use-no Drug use-no Regular exercise-yes, 1/2 ppd.  Current Smoker  Review of Systems       All systems reviewed and negative except as per HPI.   Vital Signs:  Patient profile:   65 year old male Height:      69 inches Weight:      241 pounds BMI:     35.72 Pulse rate:   66 / minute Resp:     16 per minute BP sitting:   108 / 68  (left arm)  Vitals Entered By: Marrion Coy, CNA (December 30, 2009 12:03 PM)  Physical Exam  General:  Well developed, well nourished, in no acute distress. Head:  normocephalic and atraumatic Nose:  no deformity, discharge, inflammation, or lesions Mouth:  Teeth, gums and palate normal. Oral mucosa normal. Neck:  Neck supple, no JVD. No masses, thyromegaly or abnormal cervical nodes. Lungs:  Clear bilaterally to auscultation and percussion. Heart:  Non-displaced PMI, chest non-tender; regular rate and rhythm, S1, S2 without murmurs, rubs. +S4. Carotid upstroke normal, no bruit. Pedals normal pulses. No edema, no varicosities. Abdomen:  Bowel sounds positive; abdomen soft and non-tender without masses, organomegaly, or hernias noted. No hepatosplenomegaly. Msk:  Back  normal, normal gait. Muscle strength and tone normal. Extremities:  No clubbing or cyanosis. Neurologic:  Alert and oriented x 3. Skin:  Intact without lesions or rashes. Psych:  Normal affect.   Impression & Recommendations:  Problem # 1:  CORONARY ARTERY DISEASE (ICD-414.00) Doing well with no ischemic symptoms.  Normal myoview with good exercise tolerance this month.  Continue ASA, statin, ACEI.    Problem # 2:   HYPERLIPIDEMIA (ICD-272.4) HDL is low, LDL is a little higher than goal (<70).  I will have him switch from Lipitor 80 to Crestor 40 (when he finishes his current prescription) to see if we can increase his HDL.  He has not tolerated Niaspan in the past.  I asked him also to take fish oil 2000 mg daily. Will check lipids/LFTs 2 months after starting Crestor.   Problem # 3:  TOBACCO USE (ICD-305.1) I counselled him to quit smoking.  He has started Chantix.    I will see him in a year unless needed sooner.   Patient Instructions: 1)  Your physician has recommended you make the following change in your medication:  2)  When you finish your current supply of Lipitor start Crestor 40mg  daily. 3)  Have fasting lab work 2 months after your start Crestor--I have put a lab order in for July 12,2011. 4)  Your physician wants you to follow-up in: 1 year with Dr Shirlee Latch.  You will receive a reminder letter in the mail two months in advance. If you don't receive a letter, please call our office to schedule the follow-up appointment. Prescriptions: CRESTOR 40 MG TABS (ROSUVASTATIN CALCIUM) one tablet  #30 x 3   Entered by:   Katina Dung, RN, BSN   Authorized by:   Marca Ancona, MD   Signed by:   Katina Dung, RN, BSN on 12/30/2009   Method used:   Electronically to        [facility] [phone]* (retail)       [address]       [place], [place]  [address]       Ph: [phone]       Fax: [phone]   RxID:   [id number]

## 2010-12-16 NOTE — Assessment & Plan Note (Signed)
Summary: 5 week fup//ccm PT RSC/NJR   Vital Signs:  Patient profile:   65 year old male Weight:      242 pounds Temp:     98.8 degrees F oral BP sitting:   120 / 80  (left arm) Cuff size:   regular  Vitals Entered By: Kern Reap CMA Duncan Dull) (March 12, 2010 9:06 AM) CC: follow-up visit   Primary Care Provider:  Dr. Tawanna Cooler  CC:  follow-up visit.  History of Present Illness: Dave Robles is a 65 year old male, married ex smoker x 3, and a half weeks, who comes in today for follow-up of low mood, and tobacco abuse.  He is doing well on the chantix 1 mg b.i.d. the side effects.  We discussed how to take the medicine how to taper, et Karie Soda.  His mood is much improved on the Paxil 20 mg daily however, he started sleeping all night.  We discussed various options.  We will increase the Paxil to 30 mg daily.  Allergies: 1)  ! * Hydromet  Social History: Reviewed history from 12/30/2009 and no changes required. Occupation: Owns Engineer, agricultural business Married, lives in [place].  Alcohol use-no Drug use-no Regular exercise-yes, 1/2 ppd.  Current Smoker  Review of Systems      See HPI  Physical Exam  General:  Well-developed,well-nourished,in no acute distress; alert,appropriate and cooperative throughout examination Psych:  Cognition and judgment appear intact. Alert and cooperative with normal attention span and concentration. No apparent delusions, illusions, hallucinations   Impression & Recommendations:  Problem # 1:  TOBACCO USE (ICD-305.1) Assessment Improved  The following medications were removed from the medication list:    Chantix Starting Month Pak 0.5 Mg X 11 & 1 Mg X 42 Tabs (Varenicline tartrate) ..... Uad His updated medication list for this problem includes:    Chantix Continuing Month Pak 1 Mg Tabs (Varenicline tartrate) ..... Uad  Orders: Prescription Created Electronically [phone])  Problem # 2:  ADJUSTMENT DISORDER WITH DEPRESSED MOOD (ICD-309.0) Assessment:  Improved  Orders: Prescription Created Electronically [phone])  Complete Medication List: 1)  Aspirin 81 Mg Tabs (Aspirin) .Marland Kitchen.. 1 by mouth daily 2)  Multivitamins Caps (Multiple vitamin) .... Once daily 3)  Cardura 8 Mg Tabs (Doxazosin mesylate) .... Take 1 tablet by mouth at bedtime 4)  Crestor 40 Mg Tabs (Rosuvastatin calcium) .... One tablet 5)  Zestoretic 20-12.5 Mg Tabs (Lisinopril-hydrochlorothiazide) .... Take 1 tablet by mouth once a day 6)  Nitroglycerin 0.4 Mg Subl (Nitroglycerin) .... As needed 7)  Viagra 100 Mg Tabs (Sildenafil citrate) .... Uad 8)  Chantix Continuing Month Pak 1 Mg Tabs (Varenicline tartrate) .... Uad 9)  Paxil 30 Mg Tabs (Paroxetine hcl) .Marland Kitchen.. 1 tab @ bedtime  Patient Instructions: 1)  try decreasing the chantix to one half tablet twice daily if that does not work.  Go back to a full tablet twice daily.  Taper off after 4 months as we discussed. 2)  Increase the Paxil to 30 mg daily. 3)  Return in January for your annual exam sooner if any problems Prescriptions: CHANTIX CONTINUING MONTH PAK 1 MG TABS (VARENICLINE TARTRATE) UAD  #1 x 3   Entered and Authorized by:   Roderick Pee MD   Signed by:   Roderick Pee MD on 03/12/2010   Method used:   Electronically to        [facility] [phone]* (retail)       [address]       [place]  [place], [place]  [address]       Ph: [phone]       Fax: [phone]   RxID:   [id number] PAXIL 30 MG TABS (PAROXETINE HCL) 1 tab @ bedtime  #100 x 3   Entered and Authorized by:   Roderick Pee MD   Signed by:   Roderick Pee MD on 03/12/2010   Method used:   Electronically to        [facility] [phone]* (retail)       [address]       [place], [place]  [address]       Ph: [phone]       Fax: [phone]   RxID:   [id number]

## 2010-12-18 ENCOUNTER — Encounter: Payer: Self-pay | Admitting: Family Medicine

## 2010-12-18 ENCOUNTER — Ambulatory Visit (INDEPENDENT_AMBULATORY_CARE_PROVIDER_SITE_OTHER): Payer: BC Managed Care – PPO | Admitting: Family Medicine

## 2010-12-18 ENCOUNTER — Ambulatory Visit: Admit: 2010-12-18 | Payer: Self-pay | Admitting: Family Medicine

## 2010-12-18 VITALS — BP 148/72 | Temp 98.9°F | Ht 69.25 in | Wt 256.0 lb

## 2010-12-18 DIAGNOSIS — I2510 Atherosclerotic heart disease of native coronary artery without angina pectoris: Secondary | ICD-10-CM

## 2010-12-18 DIAGNOSIS — I10 Essential (primary) hypertension: Secondary | ICD-10-CM

## 2010-12-18 DIAGNOSIS — E785 Hyperlipidemia, unspecified: Secondary | ICD-10-CM

## 2010-12-18 DIAGNOSIS — F4321 Adjustment disorder with depressed mood: Secondary | ICD-10-CM

## 2010-12-18 DIAGNOSIS — F528 Other sexual dysfunction not due to a substance or known physiological condition: Secondary | ICD-10-CM

## 2010-12-18 DIAGNOSIS — J45909 Unspecified asthma, uncomplicated: Secondary | ICD-10-CM

## 2010-12-18 MED ORDER — LISINOPRIL-HYDROCHLOROTHIAZIDE 20-12.5 MG PO TABS: 1.0000 | ORAL_TABLET | Freq: Every day | ORAL | Status: DC

## 2010-12-18 MED ORDER — NITROGLYCERIN 0.4 MG SL SUBL: 0.4000 mg | SUBLINGUAL_TABLET | SUBLINGUAL | Status: DC | PRN

## 2010-12-18 MED ORDER — ATORVASTATIN CALCIUM 80 MG PO TABS: 80.0000 mg | ORAL_TABLET | Freq: Every day | ORAL | Status: DC

## 2010-12-18 MED ORDER — SERTRALINE HCL 50 MG PO TABS: 50.0000 mg | ORAL_TABLET | Freq: Every day | ORAL | Status: DC

## 2010-12-18 MED ORDER — HYDROCODONE-HOMATROPINE 5-1.5 MG/5ML PO SYRP: 2.5000 mL | ORAL_SOLUTION | Freq: Four times a day (QID) | ORAL | Status: AC | PRN

## 2010-12-18 MED ORDER — SILDENAFIL CITRATE 100 MG PO TABS: 100.0000 mg | ORAL_TABLET | Freq: Every day | ORAL | Status: DC | PRN

## 2010-12-18 MED ORDER — PREDNISONE 20 MG PO TABS: ORAL_TABLET | ORAL | Status: DC

## 2010-12-18 NOTE — Patient Instructions (Signed)
Begin prednisone to two tabs x 3 days, one x 3 days, a half x 3 days, then a half a tablet Monday, Wednesday, Friday, for a two week taper.  Hydromet one half to 1 teaspoon 3 times a day as needed for cough.  Call your cardiologist, for your annual evaluation.  Consult with Dr. Caralyn Guile.  We turned one year, sooner if any problems

## 2010-12-18 NOTE — Progress Notes (Signed)
  Subjective:    Patient ID: Dave Robles, male    DOB: 1946-04-13, 65 y.o.   MRN: [id number]  HPI Dave Robles is a 65 year old, married male ex smoker,,,,,,, he quit March 2011 with the chantix program,,,, who comes in today for annual physical examination because of a history of hyperlipidemia erectile dysfunction, depression, hypertension, coronary disease.  He currently takes Cardura 8 mg nightly, lisinopril 20 -- 25 daily, for hypertension, BP at home 120/80.  We switched from Crestor 40 Lipitor 80 for hyperlipidemia.  Will review lipid panel.. No results found for this basename: [id number]   year, Dr.McClain his cardiologist switched him from Lipitor to Crestor, however, he still taking Lipitor.  His been on Zoloft 50 mg daily for many years.  He would like to see a psychologist to try to find out and explore some underlying issues with anxiety and depression and eating compulsion.  He also takes Viagra  100 p.r.n. For ED.   Review of Systems    Review of systems negative except for his compulsive eating in his desire to seek some psychologic counseling, which we will refer him to Dr. Dellia Cloud.  He's also had a cold for the past week, and now is coughing and thinks she may be wheezing Objective:   Physical Exam In general, he is a well-developed, well-nourished, male in no acute distress.  Examination of the HEENT was negative.  Neck was supple.  Thyroid not enlarged.  Chest exam shows symmetrical.  Breath sounds bilateral expiratory wheezing.  Cardiac exam negative.  Abdominal exam negative except is rather large.  Genitalia normal male.  No hernia.  Rectal normal stool guaiac-negative.  Prostate 1+ smooth and symmetrical non-nodular. Extremities normal.  Skin normal.  Peripheral pulses normal.  Scar left leg from previous vein harvesting.  Also scar in the midline of his chest from previous bypass surgery.      Assessment & Plan:  Hypertension plan continue Cardura 2 mg nightly and  lisinopril 2012.5 daily.  Coronary disease, asymptomatic.  Follow-up by cardiology, yearly  Next smoker since March 2011  History depression.  Continue Zoloft 50 mg daily follow-up with Dr. Dellia Cloud as outlined.  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  Plan begin prednisone 40 mg x 3 days with a taper and Hydromet cough syrup.  Erectile dysfunction continue Viagra 100 mg p.r.n.

## 2010-12-21 ENCOUNTER — Ambulatory Visit: Payer: BC Managed Care – PPO | Admitting: Psychology

## 2010-12-21 ENCOUNTER — Other Ambulatory Visit: Payer: Self-pay | Admitting: *Deleted

## 2010-12-21 DIAGNOSIS — F331 Major depressive disorder, recurrent, moderate: Secondary | ICD-10-CM

## 2010-12-21 NOTE — Telephone Encounter (Signed)
Fleet Contras please call,,,,,,,, he takes a couple days for the prednisone to work.  Continue that as we outlined.  Generic Lipitor is fine

## 2010-12-21 NOTE — Telephone Encounter (Signed)
Left message on machine for patient

## 2010-12-21 NOTE — Telephone Encounter (Signed)
Patient would like to know if generic Lipitor is okay.  Also he feels as though the prednisone is not helping with his chest congestion and would like to know if something else can be called in.

## 2010-12-23 ENCOUNTER — Encounter: Payer: Self-pay | Admitting: Family Medicine

## 2010-12-23 NOTE — Progress Notes (Signed)
Addended by: Kern Reap on: 12/23/2010 03:47 PM   Modules accepted: Orders

## 2010-12-25 ENCOUNTER — Telehealth: Payer: Self-pay | Admitting: *Deleted

## 2010-12-25 NOTE — Telephone Encounter (Signed)
Pt does not feel any better since he was in the office.  Has URI symptoms, sinus congestion and cough.   Asking to have RX called in to [facility].

## 2010-12-25 NOTE — Telephone Encounter (Signed)
I talk with the marty,,,,,,,,He states the cough is not much better and he went from two prednisone tablets daily to one advised to jump up to two tablets for 3 or 4 more days.  If no improvement by Tuesday.  Office visit

## 2011-01-05 ENCOUNTER — Ambulatory Visit (INDEPENDENT_AMBULATORY_CARE_PROVIDER_SITE_OTHER): Payer: BC Managed Care – PPO | Admitting: Psychology

## 2011-01-05 ENCOUNTER — Ambulatory Visit: Payer: BC Managed Care – PPO | Admitting: Psychology

## 2011-01-05 DIAGNOSIS — F331 Major depressive disorder, recurrent, moderate: Secondary | ICD-10-CM

## 2011-01-08 ENCOUNTER — Encounter: Payer: Self-pay | Admitting: Cardiology

## 2011-01-08 ENCOUNTER — Ambulatory Visit (INDEPENDENT_AMBULATORY_CARE_PROVIDER_SITE_OTHER): Payer: BC Managed Care – PPO | Admitting: Cardiology

## 2011-01-08 DIAGNOSIS — I2510 Atherosclerotic heart disease of native coronary artery without angina pectoris: Secondary | ICD-10-CM

## 2011-01-12 NOTE — Assessment & Plan Note (Addendum)
Summary: f1y/per pt call...per rem/lg   Primary Provider:  Dr. Tawanna Cooler   History of Present Illness: 65 yo now 21 years s/p CABG presents for cardiology followup.  He has been doing quite well in general.  No exertional shortness of breath or chest pain.  He exercises 30-40 minutes several times a week on an elliptical, plays tennis, and does yardwork without any problem.  His symptoms prior to CABG were severe dyspnea.  He quit smoking about a year ago.  Weight is essentially stable.  BP is under good control.   Labs (1/11): K 4, creatinine 1.3, LDL 81, HDL 28, LFTs normal, TSH normal Labs (1/12): K 4.1, creatinine 1.2, LDL 75, HDL 28, TSH normal  ECG: NSR, normal  Current Medications (verified): 1)  Aspirin 81 Mg  Tabs (Aspirin) .Marland Kitchen.. 1 By Mouth Daily 2)  Multivitamins   Caps (Multiple Vitamin) .... Once Daily 3)  Cardura 8 Mg  Tabs (Doxazosin Mesylate) .... Take 1 Tablet By Mouth At Bedtime 4)  Lipitor 80 Mg Tabs (Atorvastatin Calcium) .... Take One Tablet Each Morning 5)  Zestoretic 20-12.5 Mg  Tabs (Lisinopril-Hydrochlorothiazide) .... Take 1 Tablet By Mouth Once A Day 6)  Nitroglycerin 0.4 Mg  Subl (Nitroglycerin) .... As Needed 7)  Viagra 100 Mg  Tabs (Sildenafil Citrate) .... Uad 8)  Zoloft 50 Mg Tabs (Sertraline Hcl) .Marland Kitchen.. 1 Tablet Once Daily  Allergies (verified): 1)  ! * Hydromet  Past History:  Past Medical History: 1. HYPERTENSION (ICD-401.9) 2. HYPERLIPIDEMIA (ICD-272.4) 3. CORONARY ARTERY DISEASE (ICD-414.00): CABG 1991, Dr. Andrey Campanile.  ETT-myoview (2/11): 9'15", EF 67%, no ischemia or infarction.  4. EXTRINSIC ASTHMA, UNSPECIFIED (ICD-493.00) 5. PNEUMONIA DUE TO MYCOPLASMA PNEUMONIAE (ICD-483.0) 6. ERECTILE DYSFUNCTION (ICD-302.72) 7. TOBACCO USE (ICD-305.1): Quit 2011.  8. ADJUSTMENT DISORDER WITH DEPRESSED MOOD (ICD-309.0)  Social History: Occupation: Owns Engineer, agricultural business Married, lives in [place].  Alcohol use-no Drug use-no Regular exercise-yes Quit smoking  in 2011.   Vital Signs:  Patient profile:   65 year old male Height:      69 inches Weight:      245 pounds BMI:     36.31 Pulse rate:   74 / minute Pulse rhythm:   regular BP sitting:   126 / 56  (left arm) Cuff size:   large  Vitals Entered By: Judithe Modest CMA (January 08, 2011 9:06 AM)  Physical Exam  General:  Well developed, well nourished, in no acute distress. Neck:  Neck supple, no JVD. No masses, thyromegaly or abnormal cervical nodes. Lungs:  Clear bilaterally to auscultation and percussion. Heart:  Non-displaced PMI, chest non-tender; regular rate and rhythm, S1, S2 without murmurs, rubs. +S4. Carotid upstroke normal, no bruit. Pedals normal pulses. No edema, no varicosities. Abdomen:  Bowel sounds positive; abdomen soft and non-tender without masses, organomegaly, or hernias noted. No hepatosplenomegaly. Extremities:  No clubbing or cyanosis. Neurologic:  Alert and oriented x 3. Psych:  Normal affect.   Impression & Recommendations:  Problem # 1:  CORONARY ARTERY DISEASE (ICD-414.00) Doing well with no ischemic symptoms.  Normal myoview in 2011.  ECG normal. Continue ASA, statin, ACEI.    Problem # 2:  HYPERTENSION (ICD-401.9) BP under good control on current regimen.   Problem # 3:  HYPERLIPIDEMIA (ICD-272.4) LDL close to goal, HDL chronically low.

## 2011-01-19 ENCOUNTER — Ambulatory Visit (INDEPENDENT_AMBULATORY_CARE_PROVIDER_SITE_OTHER): Payer: BC Managed Care – PPO | Admitting: Psychology

## 2011-01-19 DIAGNOSIS — F331 Major depressive disorder, recurrent, moderate: Secondary | ICD-10-CM

## 2011-02-02 ENCOUNTER — Ambulatory Visit (INDEPENDENT_AMBULATORY_CARE_PROVIDER_SITE_OTHER): Payer: BC Managed Care – PPO | Admitting: Psychology

## 2011-02-02 DIAGNOSIS — F331 Major depressive disorder, recurrent, moderate: Secondary | ICD-10-CM

## 2011-02-11 ENCOUNTER — Encounter: Payer: Self-pay | Admitting: Family Medicine

## 2011-02-11 ENCOUNTER — Ambulatory Visit (INDEPENDENT_AMBULATORY_CARE_PROVIDER_SITE_OTHER): Payer: BC Managed Care – PPO | Admitting: Family Medicine

## 2011-02-11 DIAGNOSIS — F4321 Adjustment disorder with depressed mood: Secondary | ICD-10-CM

## 2011-02-11 DIAGNOSIS — I10 Essential (primary) hypertension: Secondary | ICD-10-CM

## 2011-02-11 DIAGNOSIS — J301 Allergic rhinitis due to pollen: Secondary | ICD-10-CM | POA: Insufficient documentation

## 2011-02-11 NOTE — Patient Instructions (Signed)
Decrease the Zoloft to one half tablet daily.  Decrease the lisinopril to one half tablet daily,,,,,,,,,, monitor your blood pressure,,,,,,,,, if after one month if still low, then call will cut the dose to 5 mg daily.  Remember to drink lots of water as you are doing and milk of Magnesia.,,,,,,,,,, prune juice,,,,,,,,, Metamucil,,,,,,,,,, fiber,,,,,,,,,,,,, MiraLax  Zestril 10 mg plain q. H.s. For allergic symptoms

## 2011-02-11 NOTE — Progress Notes (Signed)
  Subjective:    Patient ID: Dave Robles, male    DOB: [birth year], 65 y.o.   MRN: [id number] [name] is a 65 year old male, who comes in today for evaluation of 3 problems.  For the last days that had congestion, postnasal drip.  No previous history of allergies.  He change his diet dramatically and has lost 16 pounds in last 3 months.  This is causing change in bowel habits with some constipation recommended milk of mag and prune juice, etc.  He spent tracking his blood pressure after he stopped the Cardura.  His blood pressure continues to remain low 110/80.  Advised to cut the lisinopril and have  He also continues to see Dr. Dellia Cloud for evaluation of depression.  Dr. Dellia Cloud mentioned that it he wonders if we are to cut the Zoloft down. HPI    Review of Systems General psychiatric and cardiovascular review of systems otherwise negative    Objective:   Physical Exam    Lomotil and management.  No acute distress.  HEENT negative except for 3+ nasal edema and a lot of postnasal drip.  No adenopathy    Assessment & Plan:  Hypertension under excellent control, decreased blood pressure with weight loss.  Plan cut the lisinopril and half monitor blood pressure is in 4 weeks.  If still on the low side will cut down to 5 mg daily.  Decrease the Zoloft to 25 mg daily.  For  Allergy problems.  Take plain Zestril, 10 mg at bedtime for the next couple weeks.  For the constipation remember lots of water milk of mag,,,,,,,,,,,,,,, prune juice,,,,,,,,,,, and/or MiraLax

## 2011-02-16 ENCOUNTER — Ambulatory Visit (INDEPENDENT_AMBULATORY_CARE_PROVIDER_SITE_OTHER): Payer: BC Managed Care – PPO | Admitting: Psychology

## 2011-02-16 DIAGNOSIS — F331 Major depressive disorder, recurrent, moderate: Secondary | ICD-10-CM

## 2011-03-16 ENCOUNTER — Ambulatory Visit: Payer: BC Managed Care – PPO | Admitting: Psychology

## 2011-03-23 ENCOUNTER — Ambulatory Visit (INDEPENDENT_AMBULATORY_CARE_PROVIDER_SITE_OTHER): Payer: BC Managed Care – PPO | Admitting: Psychology

## 2011-03-23 DIAGNOSIS — F331 Major depressive disorder, recurrent, moderate: Secondary | ICD-10-CM

## 2011-03-30 NOTE — Op Note (Signed)
NAME:  Dave Robles, Dave Robles              ACCOUNT NO.:  [id number]   MEDICAL RECORD NO.:  [id number]          PATIENT TYPE:  AMB   LOCATION:  [facility]                         FACILITY:  [facility]   PHYSICIAN:  Maretta Bees. Vonita Moss, M.D.DATE OF BIRTH:  22-Dec-1945   DATE OF PROCEDURE:  11/22/2007  DATE OF DISCHARGE:                               OPERATIVE REPORT   PREOPERATIVE DIAGNOSIS:  Left hydrocele.   POSTOPERATIVE DIAGNOSIS:  Left hydrocele, small left hydrocele and large  left spermatocele.   PROCEDURE:  Left hydrocelectomy and left spermatocelectomy.   SURGEON:  Maretta Bees. Vonita Moss, M.D.   ANESTHESIA:  General.   INDICATIONS:  This gentleman has had a previous right hydrocele repair  and now he has had increasing mass in the left scrotum that is starting  to be uncomfortable for him and wishes operative removal.   PROCEDURE IN DETAIL:  The patient was brought to the operating room and  placed in supine position.  The external genitalia prepped and draped in  the usual fashion.  A left scrotal incision was made vertically and a  cystic mass isolated and brought into the operative field.  Dissection  revealed a small hydrocele but actually a large left spermatocele.  The  hydrocele was resected and after that dissection of the spermatocele off  the spermatic cord and testicle was accomplished without difficulty.  The wound was then irrigated with sterile water.  Hemostasis was  obtained by use of electrocautery.  Excess spermatocele sac was removed.  The edge of the remaining tunica vaginalis was then sutured behind the  testicle to prevent future hydrocele formation.  At this point his wound  was injected with 0.25% Marcaine for postop analgesia.  The testicle was  placed back in the scrotum and the scrotal wall closed in two layers of  3-0 running chromic catgut.  The wound was cleaned with dry sterile  gauze dressings.  Blood loss was minimal and he was taken to the  recovery room in good  condition having tolerated the procedure well.      Maretta Bees. Vonita Moss, M.D.  Electronically Signed     LJP/MEDQ  D:  11/22/2007  T:  11/22/2007  Job:  [id number]

## 2011-04-08 ENCOUNTER — Ambulatory Visit: Payer: BC Managed Care – PPO | Admitting: Psychology

## 2011-04-13 ENCOUNTER — Ambulatory Visit (INDEPENDENT_AMBULATORY_CARE_PROVIDER_SITE_OTHER): Payer: BC Managed Care – PPO | Admitting: Psychology

## 2011-04-13 DIAGNOSIS — F331 Major depressive disorder, recurrent, moderate: Secondary | ICD-10-CM

## 2011-05-07 ENCOUNTER — Ambulatory Visit (INDEPENDENT_AMBULATORY_CARE_PROVIDER_SITE_OTHER): Payer: BC Managed Care – PPO | Admitting: Internal Medicine

## 2011-05-07 ENCOUNTER — Encounter: Payer: Self-pay | Admitting: Internal Medicine

## 2011-05-07 VITALS — BP 100/70 | Temp 98.3°F | Wt 238.0 lb

## 2011-05-07 DIAGNOSIS — Z23 Encounter for immunization: Secondary | ICD-10-CM

## 2011-05-07 DIAGNOSIS — Z2911 Encounter for prophylactic immunotherapy for respiratory syncytial virus (RSV): Secondary | ICD-10-CM

## 2011-05-07 DIAGNOSIS — L259 Unspecified contact dermatitis, unspecified cause: Secondary | ICD-10-CM

## 2011-05-07 DIAGNOSIS — L309 Dermatitis, unspecified: Secondary | ICD-10-CM

## 2011-05-07 NOTE — Progress Notes (Signed)
  Subjective:    Patient ID: Dave Robles, male    DOB: Oct 26, 1946, 65 y.o.   MRN: [id number]  HPI 65 year old patient who presents with a four-day history of a rash involving his right medial lower leg. He is an Herbalist. Rash is described as slightly pruritic.    Review of Systems  Skin: Positive for rash.       Objective:   Physical Exam  Skin: Rash noted.       Patchy erythematous slightly raised eruption involving his right medial lower leg just proximal to the ankle. This was consistent with a contact dermatitis          Assessment & Plan:    contact dermatitis. The patient does have some triamcinolone cream he will use this twice daily. He will call when necessary if unimproved

## 2011-05-07 NOTE — Progress Notes (Signed)
Addended by: Kern Reap B on: 05/07/2011 11:15 AM   Modules accepted: Orders

## 2011-05-07 NOTE — Patient Instructions (Signed)
Use triamcinolone cream twice daily as needed  Call or return to clinic prn if these symptoms worsen or fail to improve as anticipated.

## 2011-05-18 ENCOUNTER — Ambulatory Visit: Payer: BC Managed Care – PPO | Admitting: Family Medicine

## 2011-07-13 ENCOUNTER — Ambulatory Visit (INDEPENDENT_AMBULATORY_CARE_PROVIDER_SITE_OTHER): Payer: BC Managed Care – PPO | Admitting: Psychology

## 2011-07-13 DIAGNOSIS — F331 Major depressive disorder, recurrent, moderate: Secondary | ICD-10-CM

## 2011-07-22 ENCOUNTER — Telehealth: Payer: Self-pay | Admitting: Family Medicine

## 2011-07-22 MED ORDER — CYCLOBENZAPRINE HCL 10 MG PO TABS: 10.0000 mg | ORAL_TABLET | Freq: Three times a day (TID) | ORAL | Status: DC | PRN

## 2011-07-22 NOTE — Telephone Encounter (Signed)
Pt called, requesting refill on Flexeril, but I don't see it on sheet. Please call in to [facility] on [address], or call pt on his cell.

## 2011-07-22 NOTE — Telephone Encounter (Signed)
Generic 10 mg  #30

## 2011-08-05 LAB — POCT I-STAT 4, (NA,K, GLUC, HGB,HCT): Potassium: 4.1

## 2011-08-18 ENCOUNTER — Telehealth: Payer: Self-pay | Admitting: Family Medicine

## 2011-08-18 NOTE — Telephone Encounter (Signed)
patient  Is aware 

## 2011-08-18 NOTE — Telephone Encounter (Signed)
after

## 2011-08-18 NOTE — Telephone Encounter (Signed)
Pt would like to know if he should see the cardiologist before or after his physical. Please contact

## 2011-10-05 ENCOUNTER — Other Ambulatory Visit: Payer: Self-pay | Admitting: Internal Medicine

## 2011-11-30 ENCOUNTER — Ambulatory Visit (INDEPENDENT_AMBULATORY_CARE_PROVIDER_SITE_OTHER): Payer: BC Managed Care – PPO | Admitting: Family Medicine

## 2011-11-30 ENCOUNTER — Encounter: Payer: Self-pay | Admitting: Family Medicine

## 2011-11-30 VITALS — BP 160/90 | Temp 99.0°F | Wt 239.0 lb

## 2011-11-30 DIAGNOSIS — I10 Essential (primary) hypertension: Secondary | ICD-10-CM

## 2011-11-30 DIAGNOSIS — E785 Hyperlipidemia, unspecified: Secondary | ICD-10-CM

## 2011-11-30 DIAGNOSIS — I2510 Atherosclerotic heart disease of native coronary artery without angina pectoris: Secondary | ICD-10-CM

## 2011-11-30 DIAGNOSIS — J45909 Unspecified asthma, uncomplicated: Secondary | ICD-10-CM

## 2011-11-30 LAB — LIPID PANEL
Cholesterol: 129 mg/dL (ref 0–200)
Total CHOL/HDL Ratio: 4
Triglycerides: 239 mg/dL — ABNORMAL HIGH (ref 0.0–149.0)
VLDL: 47.8 mg/dL — ABNORMAL HIGH (ref 0.0–40.0)

## 2011-11-30 LAB — CBC WITH DIFFERENTIAL/PLATELET
Basophils Relative: 0.7 % (ref 0.0–3.0)
Eosinophils Relative: 2.7 % (ref 0.0–5.0)
Hemoglobin: 15.6 g/dL (ref 13.0–17.0)
Lymphocytes Relative: 23.6 % (ref 12.0–46.0)
Monocytes Relative: 7.6 % (ref 3.0–12.0)
Neutro Abs: 6.9 10*3/uL (ref 1.4–7.7)
RBC: 4.78 Mil/uL (ref 4.22–5.81)

## 2011-11-30 LAB — POCT URINALYSIS DIPSTICK
Bilirubin, UA: NEGATIVE
Glucose, UA: NEGATIVE
Nitrite, UA: NEGATIVE
Spec Grav, UA: 1.015

## 2011-11-30 LAB — PSA: PSA: 1.44 ng/mL (ref 0.10–4.00)

## 2011-11-30 LAB — BASIC METABOLIC PANEL
CO2: 28 mEq/L (ref 19–32)
GFR: 67.27 mL/min (ref >60.00)
Glucose, Bld: 91 mg/dL (ref 70–99)
Potassium: 4.1 mEq/L (ref 3.5–5.1)
Sodium: 140 mEq/L (ref 135–145)

## 2011-11-30 LAB — HEPATIC FUNCTION PANEL
Albumin: 3.8 g/dL (ref 3.5–5.2)
Alkaline Phosphatase: 73 U/L (ref 39–117)

## 2011-11-30 MED ORDER — PREDNISONE 20 MG PO TABS: ORAL_TABLET | ORAL | Status: DC

## 2011-11-30 NOTE — Progress Notes (Signed)
  Subjective:    Patient ID: Dave Robles, male    DOB: 24-Apr-1946, 66 y.o.   MRN: [id number]  HPI Dave Robles is a 66 year old, married man nonsmoker, who comes in today with a week's history of coughing.  No fever no sputum production pain.  He did quit smoking in March 2011  He had some oral prednisone 30 to 40 mg daily for two days, then tapered last week.  It did somewhat help the cough, but the cough is not completely gone away.  As noted he does have a history of asthma when he gets a viral infection   Review of Systems    General pulmonary venous systems otherwise negative Objective:   Physical Exam  Well-developed well-nourished, male in no acute distress.  HEENT negative.  Neck was supple.  No adenopathy.  Lungs were clear except for mild late expiratory wheezing symmetrical      Assessment & Plan:  Bowel syndrome, with secondary asthma.  Plan prednisone burst and taper return p.r.n.

## 2011-11-30 NOTE — Patient Instructions (Signed)
Prednisone,,,,,,,,,,,,, one tab x 3 days, a half a tab x 3 days, then half a tablet Monday, Wednesday, Friday, for a two week taper  Return p.r.n.

## 2011-12-14 ENCOUNTER — Other Ambulatory Visit: Payer: BC Managed Care – PPO

## 2011-12-21 ENCOUNTER — Ambulatory Visit (INDEPENDENT_AMBULATORY_CARE_PROVIDER_SITE_OTHER): Payer: BC Managed Care – PPO | Admitting: Family Medicine

## 2011-12-21 ENCOUNTER — Encounter: Payer: Self-pay | Admitting: Family Medicine

## 2011-12-21 DIAGNOSIS — I2510 Atherosclerotic heart disease of native coronary artery without angina pectoris: Secondary | ICD-10-CM

## 2011-12-21 DIAGNOSIS — E785 Hyperlipidemia, unspecified: Secondary | ICD-10-CM

## 2011-12-21 DIAGNOSIS — F528 Other sexual dysfunction not due to a substance or known physiological condition: Secondary | ICD-10-CM

## 2011-12-21 DIAGNOSIS — M549 Dorsalgia, unspecified: Secondary | ICD-10-CM

## 2011-12-21 DIAGNOSIS — I10 Essential (primary) hypertension: Secondary | ICD-10-CM

## 2011-12-21 DIAGNOSIS — E669 Obesity, unspecified: Secondary | ICD-10-CM

## 2011-12-21 MED ORDER — LISINOPRIL-HYDROCHLOROTHIAZIDE 20-12.5 MG PO TABS: 1.0000 | ORAL_TABLET | Freq: Every day | ORAL | Status: DC

## 2011-12-21 MED ORDER — SILDENAFIL CITRATE 100 MG PO TABS: 100.0000 mg | ORAL_TABLET | Freq: Every day | ORAL | Status: DC | PRN

## 2011-12-21 MED ORDER — NITROGLYCERIN 0.4 MG SL SUBL: 0.4000 mg | SUBLINGUAL_TABLET | SUBLINGUAL | Status: DC | PRN

## 2011-12-21 MED ORDER — CYCLOBENZAPRINE HCL 10 MG PO TABS: 10.0000 mg | ORAL_TABLET | Freq: Two times a day (BID) | ORAL | Status: DC | PRN

## 2011-12-21 MED ORDER — ATORVASTATIN CALCIUM 80 MG PO TABS: 80.0000 mg | ORAL_TABLET | Freq: Every day | ORAL | Status: DC

## 2011-12-21 NOTE — Progress Notes (Signed)
  Subjective:    Patient ID: Dave Robles, male    DOB: 1946/09/11, 66 y.o.   MRN: [id number]  HPI Dave Robles is a 66 year old married male X. Smoker,,,,,,,,,, times one year plus,,,,,,,,, he was a closet smoker,,,,,,, who comes in today for evaluation of hypertension hyperlipidemia coronary artery disease  His blood pressure is 130/78 on Zestoretic 20-12.5 daily.  He takes Viagra 100 mg when necessary for ED  He takes Lipitor 80 mg and aspirin tablet daily for hyperlipidemia  He uses Flexeril when necessary for back pain  He's not had to use any nitroglycerin. He says he is exercising on a regular basis and he's lost weight. He is down to 236. He states he gained some weight this winter and is frustrated because he is following a strict caloric restriction monitoring his caloric intake exercising on a regular basis. In the past I sent him to see Caralyn Guile for psychologic counseling. He says he feels much better.   Review of Systems  Constitutional: Negative.   HENT: Negative.   Eyes: Negative.   Respiratory: Negative.   Cardiovascular: Negative.   Gastrointestinal: Negative.   Genitourinary: Negative.   Musculoskeletal: Negative.   Skin: Negative.   Neurological: Negative.   Hematological: Negative.   Psychiatric/Behavioral: Negative.        Objective:   Physical Exam  Constitutional: He is oriented to person, place, and time. He appears well-developed and well-nourished.  HENT:  Head: Normocephalic and atraumatic.  Right Ear: External ear normal.  Left Ear: External ear normal.  Nose: Nose normal.  Mouth/Throat: Oropharynx is clear and moist.  Eyes: Conjunctivae and EOM are normal. Pupils are equal, round, and reactive to light.  Neck: Normal range of motion. Neck supple. No JVD present. No tracheal deviation present. No thyromegaly present.  Cardiovascular: Normal rate, regular rhythm, normal heart sounds and intact distal pulses.  Exam reveals no gallop and no  friction rub.   No murmur heard.      Scar mid chest from previous bypass surgery due to see Dr.  Lucien Mons his cardiologist next month  Pulmonary/Chest: Effort normal and breath sounds normal. No stridor. No respiratory distress. He has no wheezes. He has no rales. He exhibits no tenderness.  Abdominal: Soft. Bowel sounds are normal. He exhibits no distension and no mass. There is no tenderness. There is no rebound and no guarding.  Genitourinary: Rectum normal, prostate normal and penis normal. Guaiac negative stool. No penile tenderness.  Musculoskeletal: Normal range of motion. He exhibits no edema and no tenderness.  Lymphadenopathy:    He has no cervical adenopathy.  Neurological: He is alert and oriented to person, place, and time. He has normal reflexes. No cranial nerve deficit. He exhibits normal muscle tone.  Skin: Skin is warm and dry. No rash noted. No erythema. No pallor.  Psychiatric: He has a normal mood and affect. His behavior is normal. Judgment and thought content normal.          Assessment & Plan:  Healthy male  Hyperlipidemia continue current medication  Hypertension continue current medication  Coronary disease asymptomatic continue diet and exercise program  Erectile dysfunction continue Viagra 100 mg when necessary  Back pain occasional Flexeril when necessary 6  Again he is frustrated with his weight he would like to lose more weight. I referred him to the nutritionist at [facility]

## 2011-12-21 NOTE — Patient Instructions (Signed)
Continue your current medications  Continue your diet and exercise program,,,,,,,,,, I put in a request for a nutrition consult to see if this will help  Followup in 1 year sooner if any problems

## 2012-02-04 ENCOUNTER — Encounter: Payer: BC Managed Care – PPO | Attending: Family Medicine | Admitting: *Deleted

## 2012-02-04 ENCOUNTER — Encounter: Payer: Self-pay | Admitting: *Deleted

## 2012-02-04 DIAGNOSIS — E669 Obesity, unspecified: Secondary | ICD-10-CM | POA: Insufficient documentation

## 2012-02-04 DIAGNOSIS — Z713 Dietary counseling and surveillance: Secondary | ICD-10-CM | POA: Insufficient documentation

## 2012-02-04 DIAGNOSIS — E785 Hyperlipidemia, unspecified: Secondary | ICD-10-CM | POA: Insufficient documentation

## 2012-02-04 NOTE — Progress Notes (Signed)
  Medical Nutrition Therapy:  Appt start time: 0830 end time:  0930.   Assessment:  Primary concerns today: Patient here for assistance with weight loss. He has been successful with weight loss in the past, but recently even with increased activity level and decreased food intake he is not losing any weight. Activity includes swimming, bike riding, tennis and golf on a regular basis. He has used the Lose It APP in the past to track calories eaten and burned with activity but no success with that either lately.  MEDICATIONS: see list   DIETARY INTAKE:  Usual eating pattern includes 3 meals and 1-3 snacks per day.  Everyday foods include good variety of all food groups.  Avoided foods include fried and high sugar foods.    24-hr recall:  B ( AM): unsweetened cereal with skim milk, OR yogurt with small amount of cereal OR bacon & eggs OR pancakes, coffee with Splenda Snk ( AM): occasional potato chips or other salty snack  L ( PM): salad to work and add meat to it, lite dressing OR BBQ sandwich or plate OR infrequent fast food, water Snk ( PM): occasionally a salty snack or apple D ( PM): wife prepares at home: chicken, starch x 2-3 scoops, usually a vegetable, occasionally bread, water Snk ( PM): small amounts of salty snack Beverages: coffee, water, beer  Usual physical activity: 4-6 days a week, road bike, walking, gym, tennis and golf  Estimated energy needs: 1800 calories 200 g carbohydrates 135 g protein 50 g fat  Progress Towards Goal(s):  In progress.   Nutritional Diagnosis:  NI-1.5 Excessive energy intake As related to obesity.  As evidenced by BMI of 35.2%.    Intervention:  Nutrition counseling provided with emphasis on carb counting and reducing fat intake. His diet appears to be lower in protein and excessive in fat even though calorie level appears appropriate for weight loss. Suggest he decrease fat intake and increase protein moderately. Plan: Aim for 3-4 Carb  Choices (45-60 grams) per meal, 0-2 per snack if hungry Read Food Labels for Total Carbohydrate of foods eaten at home Contiunue "Lose It" APP to phone to assist in recording food and exercise easily Continue with increase activity as tolerated  Handouts given during visit include:  Carb Counting and Food Label handouts  Meal Plan Card  Monitoring/Evaluation:  Dietary intake, exercise, Reading Food Labels, and body weight prn.

## 2012-02-08 NOTE — Patient Instructions (Signed)
Plan: Aim for 3-4 Carb Choices (45-60 grams) per meal, 0-2 per snack if hungry Read Food Labels for Total Carbohydrate of foods eaten at home Consider adding "Lose It" APP to phone to assist in recording food and exercise easily Continue with increase activity as tolerated

## 2012-03-06 ENCOUNTER — Telehealth: Payer: Self-pay | Admitting: Cardiology

## 2012-03-06 NOTE — Telephone Encounter (Signed)
New Problem:     I called the patient and was unable to reach them. I left a message on their voicemail with my name, the reason I called, the name of his physician, and a number to call back to schedule his appointment. 

## 2012-05-15 ENCOUNTER — Ambulatory Visit (INDEPENDENT_AMBULATORY_CARE_PROVIDER_SITE_OTHER): Payer: BC Managed Care – PPO | Admitting: Family Medicine

## 2012-05-15 VITALS — BP 135/81 | Ht 70.0 in | Wt 234.0 lb

## 2012-05-15 DIAGNOSIS — S76319A Strain of muscle, fascia and tendon of the posterior muscle group at thigh level, unspecified thigh, initial encounter: Secondary | ICD-10-CM

## 2012-05-15 DIAGNOSIS — IMO0002 Reserved for concepts with insufficient information to code with codable children: Secondary | ICD-10-CM

## 2012-05-15 NOTE — Patient Instructions (Addendum)
Hamstring curls: Start with 3 sets of 15 (no weight); Progress by 5 reps every 3 days until you reach 3 sets of 30; After 3 days at 3 sets of 30, add 2lb ankle weight at 3 sets of 10; Increase every 5 days by 5 reps. You may add 2lbs ankle weight once weekly. Hamstring swings- swing leg backwards and curl at the end of the swing. Follow same schedule as above. Hamstring running lunges- running lunge position means no more than 45 degrees of knee flexion and running motion. Follow same schedule as above.  Plyometrics: bound and land in running position; 3 sets of 15 level first; then up a rise; then down a rise; only advance when level is easy. No greater than 45 deg knee flexion. Stride drills- 10 x 75 yards; bounding but no more than 45 deg knee flexion. Level for 1 week; then add up hill for 1 week; then add down hill. Hop drills- 15 repetitive hops- 3 sets; compare injured to non injured leg (4th week) .

## 2012-05-17 ENCOUNTER — Encounter: Payer: Self-pay | Admitting: Family Medicine

## 2012-05-17 DIAGNOSIS — S76319A Strain of muscle, fascia and tendon of the posterior muscle group at thigh level, unspecified thigh, initial encounter: Secondary | ICD-10-CM | POA: Insufficient documentation

## 2012-05-17 NOTE — Progress Notes (Signed)
  Subjective:    Patient ID: Dave Robles, male    DOB: 1946/01/18, 66 y.o.   MRN: [id number]  HPI  Date off injury May 06, 2012 Posterior thigh pain for 10 days. Was doing some running and felt a fairly sudden pain in his left posterior thigh stopped his activity. The next day it was quite sore. He did nothing for about a week and he got some better but is still sore. His friend who is a physical therapist told him he probably had a hamstring tear. Not had any knee pain or hip pain. Denies calf swelling. No numbness in the left lower extremity. No giving way or locking of his knee. He's able to do all of his ADLs but has not been able to run without some pain. He has been able to do some hiking. .Review of Systems     Her present illness above for pertinent review of systems. Additionally he denies fever.   Objective:   Physical Exam  Vital signs reviewed. GENERAL: Well developed, well nourished, no acute distress THIGH left echymoses. No defect in muscle. Strength 4/5 c/w 5/5 right Calf is sdoft KNEE FROM ext and flex       Assessment & Plan:

## 2012-05-17 NOTE — Assessment & Plan Note (Signed)
Start hamstring rehabilitation program. He is given handouts on the exercises and they were explained today in clinic. I'll see him back in 3-4 weeks. No Piller downhill running, and no biking against any resistance, no biking uphill

## 2012-07-27 ENCOUNTER — Other Ambulatory Visit: Payer: Self-pay | Admitting: Family Medicine

## 2012-07-31 ENCOUNTER — Telehealth: Payer: Self-pay | Admitting: Family Medicine

## 2012-07-31 NOTE — Telephone Encounter (Signed)
Fleet Contras please check this out

## 2012-07-31 NOTE — Telephone Encounter (Signed)
[facility] [address] [facility] req refill of pts cyclobenzaprine (FLEXERIL) 10 MG tablet. Said refill req was sent over by escript on 9/12 and 9/14. Also sending over hard script refill refill by fax. Pls call.

## 2012-07-31 NOTE — Telephone Encounter (Signed)
Rx sent.  Okay per Dr Tawanna Cooler

## 2012-09-19 ENCOUNTER — Ambulatory Visit (INDEPENDENT_AMBULATORY_CARE_PROVIDER_SITE_OTHER): Payer: BC Managed Care – PPO | Admitting: Cardiology

## 2012-09-19 ENCOUNTER — Encounter: Payer: Self-pay | Admitting: Cardiology

## 2012-09-19 VITALS — BP 142/80 | HR 77 | Ht 70.0 in | Wt 239.0 lb

## 2012-09-19 DIAGNOSIS — I25810 Atherosclerosis of coronary artery bypass graft(s) without angina pectoris: Secondary | ICD-10-CM

## 2012-09-19 DIAGNOSIS — I10 Essential (primary) hypertension: Secondary | ICD-10-CM

## 2012-09-19 DIAGNOSIS — I2510 Atherosclerotic heart disease of native coronary artery without angina pectoris: Secondary | ICD-10-CM

## 2012-09-19 DIAGNOSIS — E785 Hyperlipidemia, unspecified: Secondary | ICD-10-CM

## 2012-09-19 NOTE — Progress Notes (Signed)
Patient ID: Dave Robles, male   DOB: 1946/03/23, 66 y.o.   MRN: [id number] PCP: Dr. Tawanna Cooler  67 yo now > 20 years s/p CABG presents for cardiology followup. He has been doing quite well in general. No exertional shortness of breath or chest pain. He leads an active lifestyle.  He plays tennis, golf, and rides his road bike about 50 miles a week. His symptoms prior to CABG were severe dyspnea. He does not smoke.  BP is mildly elevated today but systolic BP has been in the 120s at home.  He is frustrated because despite a fair amount of exercise, he has been unable to lose much weight.  In talking to him, it seems like his diet is still not idea. He does a lot of snacking in the late evening, which I told him he ought to cut out.    Labs (1/11): K 4, creatinine 1.3, LDL 81, HDL 28, LFTs normal, TSH normal  Labs (1/12): K 4.1, creatinine 1.2, LDL 75, HDL 28, TSH normal  Labs (1/19): K 4.1, creatinine 1.2, LDL 67, HDL 31  ECG: NSR, nonspecific inferior and anterolateral ST and T wave changes.  Allergies (verified):  1) ! * Hydromet   Past Medical History:  1. HYPERTENSION (ICD-401.9)  2. HYPERLIPIDEMIA (ICD-272.4)  3. CORONARY ARTERY DISEASE (ICD-414.00): CABG 1991, Dr. Andrey Campanile. ETT-myoview (2/11): 9'15", EF 67%, no ischemia or infarction.  4. EXTRINSIC ASTHMA, UNSPECIFIED (ICD-493.00)  5. PNEUMONIA DUE TO MYCOPLASMA PNEUMONIAE (ICD-483.0)  6. ERECTILE DYSFUNCTION (ICD-302.72)  7. TOBACCO USE (ICD-305.1): Quit 2011.  8. 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  Alcohol use-no  Drug use-no  Regular exercise-yes  Quit smoking in 2011.   Family history: CAD  Current Outpatient Prescriptions  Medication Sig Dispense Refill  . aspirin 81 MG tablet Take 81 mg by mouth daily.        Marland Kitchen atorvastatin (LIPITOR) 80 MG tablet Take 1 tablet (80 mg total) by mouth daily.  100 tablet  3  . cyclobenzaprine  (FLEXERIL) 10 MG tablet TAKE 1 TABLET BY MOUTH TWICE A DAY AS NEEDED FOR MUSCLE SPASMS  50 tablet  3  . lisinopril-hydrochlorothiazide (PRINZIDE,ZESTORETIC) 20-12.5 MG per tablet Take 1 tablet by mouth daily.  100 tablet  3  . Multiple Vitamin (MULTIVITAMIN) capsule Take 1 capsule by mouth daily.        . sildenafil (VIAGRA) 100 MG tablet Take 1 tablet (100 mg total) by mouth daily as needed.  6 tablet  11    BP 142/80  Pulse 77  Ht 5\' 10"  (1.778 m)  Wt 239 lb (108.41 kg)  BMI 34.29 kg/m2 General: NAD, obese Neck: Thick neck, no JVD, no thyromegaly or thyroid nodule.  Lungs: Clear to auscultation bilaterally with normal respiratory effort. CV: Nondisplaced PMI.  Heart regular S1/S2, no S3/S4, no murmur.  No peripheral edema.  No carotid bruit.  Normal pedal pulses.  Abdomen: Soft, nontender, no hepatosplenomegaly, no distention.   Neurologic: Alert and oriented x 3.  Psych: Normal affect. Extremities: No clubbing or cyanosis.   Assessment/Plan: 1. CAD: Stable with no ischemic symptoms.  Continue ASA 81, statin, ACEI.   2. HTN: SBP in 120s when he checks at home.  Continue lisinopril/HCTZ.  3. Hyperlipidemia: Goal LDL < 70.  LDL was at goal in 1/13.  He will get lipids again at his physical with Dr. Tawanna Cooler.  4. Obesity: Patient has been  doing a good amount of exercise but has had a hard time losing weight and keeping it off.  I think that his diet is probably the issue.  I asked him to start off trying to limit nocturnal snacks.   Marca Ancona 09/19/2012

## 2012-09-19 NOTE — Patient Instructions (Addendum)
Your physician wants you to follow-up in: 1 year with Dr McLean. (November 2014).  You will receive a reminder letter in the mail two months in advance. If you don't receive a letter, please call our office to schedule the follow-up appointment.  

## 2012-09-29 ENCOUNTER — Telehealth: Payer: Self-pay | Admitting: Family Medicine

## 2012-09-29 NOTE — Telephone Encounter (Signed)
Pt called and said that he needs to get documetation that he had psa lvls on 12/21/11. Pls fax to pts office Fax # [phone].

## 2012-10-02 NOTE — Telephone Encounter (Signed)
Left message on machine for patient to return our call 

## 2012-10-03 NOTE — Telephone Encounter (Signed)
Copy faxed to a secure fax

## 2012-12-14 ENCOUNTER — Other Ambulatory Visit (INDEPENDENT_AMBULATORY_CARE_PROVIDER_SITE_OTHER): Payer: Medicare Other

## 2012-12-14 DIAGNOSIS — Z125 Encounter for screening for malignant neoplasm of prostate: Secondary | ICD-10-CM

## 2012-12-14 DIAGNOSIS — I10 Essential (primary) hypertension: Secondary | ICD-10-CM

## 2012-12-14 DIAGNOSIS — Z79899 Other long term (current) drug therapy: Secondary | ICD-10-CM

## 2012-12-14 DIAGNOSIS — E785 Hyperlipidemia, unspecified: Secondary | ICD-10-CM

## 2012-12-14 DIAGNOSIS — Z0000 Encounter for general adult medical examination without abnormal findings: Secondary | ICD-10-CM

## 2012-12-14 LAB — HEPATIC FUNCTION PANEL
ALT: 30 U/L (ref 0–53)
Albumin: 3.7 g/dL (ref 3.5–5.2)
Total Bilirubin: 0.8 mg/dL (ref 0.3–1.2)
Total Protein: 6.4 g/dL (ref 6.0–8.3)

## 2012-12-14 LAB — POCT URINALYSIS DIPSTICK
Nitrite, UA: NEGATIVE
Protein, UA: NEGATIVE
Spec Grav, UA: 1.02
Urobilinogen, UA: 1
pH, UA: 6

## 2012-12-14 LAB — CBC WITH DIFFERENTIAL/PLATELET
Basophils Relative: 1.3 % (ref 0.0–3.0)
Eosinophils Relative: 4.2 % (ref 0.0–5.0)
HCT: 47.1 % (ref 39.0–52.0)
Hemoglobin: 15.8 g/dL (ref 13.0–17.0)
Lymphs Abs: 2.6 10*3/uL (ref 0.7–4.0)
MCV: 94.5 fl (ref 78.0–100.0)
Monocytes Absolute: 0.6 10*3/uL (ref 0.1–1.0)
Neutro Abs: 3.5 10*3/uL (ref 1.4–7.7)
Platelets: 165 10*3/uL (ref 150.0–400.0)
WBC: 7.1 10*3/uL (ref 4.5–10.5)

## 2012-12-14 LAB — LIPID PANEL
LDL Cholesterol: 73 mg/dL (ref 0–99)
Total CHOL/HDL Ratio: 4

## 2012-12-14 LAB — BASIC METABOLIC PANEL
BUN: 19 mg/dL (ref 6–23)
Chloride: 103 mEq/L (ref 96–112)
Potassium: 4 mEq/L (ref 3.5–5.1)

## 2012-12-14 LAB — PSA: PSA: 1.08 ng/mL (ref 0.10–4.00)

## 2012-12-14 LAB — TSH: TSH: 1.58 u[IU]/mL (ref 0.35–5.50)

## 2012-12-21 ENCOUNTER — Encounter: Payer: BC Managed Care – PPO | Admitting: Family Medicine

## 2012-12-22 ENCOUNTER — Other Ambulatory Visit: Payer: Self-pay | Admitting: Family Medicine

## 2012-12-22 ENCOUNTER — Encounter: Payer: Self-pay | Admitting: Family Medicine

## 2013-01-01 ENCOUNTER — Ambulatory Visit (INDEPENDENT_AMBULATORY_CARE_PROVIDER_SITE_OTHER): Payer: Medicare Other | Admitting: Family Medicine

## 2013-01-01 ENCOUNTER — Encounter: Payer: Self-pay | Admitting: Family Medicine

## 2013-01-01 VITALS — BP 150/90 | Temp 99.1°F | Ht 70.0 in | Wt 243.0 lb

## 2013-01-01 VITALS — BP 136/79 | HR 84 | Ht 70.0 in | Wt 243.0 lb

## 2013-01-01 DIAGNOSIS — M549 Dorsalgia, unspecified: Secondary | ICD-10-CM

## 2013-01-01 DIAGNOSIS — Z0000 Encounter for general adult medical examination without abnormal findings: Secondary | ICD-10-CM

## 2013-01-01 DIAGNOSIS — S83429A Sprain of lateral collateral ligament of unspecified knee, initial encounter: Secondary | ICD-10-CM

## 2013-01-01 DIAGNOSIS — I10 Essential (primary) hypertension: Secondary | ICD-10-CM

## 2013-01-01 DIAGNOSIS — E785 Hyperlipidemia, unspecified: Secondary | ICD-10-CM

## 2013-01-01 DIAGNOSIS — F528 Other sexual dysfunction not due to a substance or known physiological condition: Secondary | ICD-10-CM

## 2013-01-01 DIAGNOSIS — I2510 Atherosclerotic heart disease of native coronary artery without angina pectoris: Secondary | ICD-10-CM

## 2013-01-01 DIAGNOSIS — J301 Allergic rhinitis due to pollen: Secondary | ICD-10-CM

## 2013-01-01 MED ORDER — SILDENAFIL CITRATE 100 MG PO TABS: 100.0000 mg | ORAL_TABLET | Freq: Every day | ORAL | Status: DC | PRN

## 2013-01-01 MED ORDER — ATORVASTATIN CALCIUM 80 MG PO TABS: ORAL_TABLET | ORAL | Status: DC

## 2013-01-01 MED ORDER — NITROGLYCERIN 0.4 MG SL SUBL: 0.4000 mg | SUBLINGUAL_TABLET | SUBLINGUAL | Status: DC | PRN

## 2013-01-01 MED ORDER — LISINOPRIL-HYDROCHLOROTHIAZIDE 20-12.5 MG PO TABS: 1.0000 | ORAL_TABLET | Freq: Every day | ORAL | Status: DC

## 2013-01-01 MED ORDER — CYCLOBENZAPRINE HCL 10 MG PO TABS: ORAL_TABLET | ORAL | Status: DC

## 2013-01-01 NOTE — Patient Instructions (Signed)
Continue your current medications  With your other medications you have to be cautious about taking over-the-counter medications,,,,,,,,,,, for your knee pain I would recommend 400 mg of Motrin twice a day with food,,,,,,,, no more  Return in one year for general physical examination sooner if any problems

## 2013-01-01 NOTE — Progress Notes (Signed)
  Subjective:    Patient ID: Dave Robles, male    DOB: 09-19-1946, 67 y.o.   MRN: [id number]  HPIMartin is a 67 year old married male ex-smoker who comes in today for a Medicare wellness examination because of a history of hyperlipidemia, coronary disease, low back pain, hypertension, erectile dysfunction  Cardiac evaluation in the fall 2013 by Dr. Shirlee Latch within normal limits  Medications unchanged  He gets routine eye care, dental care, colonoscopy and GI, seasonal flu shot 2013, Pneumovax x2, tetanus 2008, shingles 2012.  Cognitive function normal he walks on a regular basis home health safety reviewed no issues identified, no guns in the house, he does have a health care power of attorney and living will    Review of Systems  Constitutional: Negative.   HENT: Negative.   Eyes: Negative.   Respiratory: Negative.   Cardiovascular: Negative.   Gastrointestinal: Negative.   Genitourinary: Negative.   Musculoskeletal: Negative.   Skin: Negative.   Neurological: Negative.   Psychiatric/Behavioral: Negative.   he and his wife are going to [place] in May      Objective:   Physical Exam  Constitutional: He is oriented to person, place, and time. He appears well-developed and well-nourished.  HENT:  Head: Normocephalic and atraumatic.  Right Ear: External ear normal.  Left Ear: External ear normal.  Nose: Nose normal.  Mouth/Throat: Oropharynx is clear and moist.  Eyes: Conjunctivae and EOM are normal. Pupils are equal, round, and reactive to light.  Neck: Normal range of motion. Neck supple. No JVD present. No tracheal deviation present. No thyromegaly present.  Cardiovascular: Normal rate, regular rhythm, normal heart sounds and intact distal pulses.  Exam reveals no gallop and no friction rub.   No murmur heard. Pulmonary/Chest: Effort normal and breath sounds normal. No stridor. No respiratory distress. He has no wheezes. He has no rales. He exhibits no tenderness.   Abdominal: Soft. Bowel sounds are normal. He exhibits no distension and no mass. There is no tenderness. There is no rebound and no guarding.  Genitourinary: Rectum normal and penis normal. Guaiac negative stool. No penile tenderness.  2+ symmetrical BPH nonnodular  Musculoskeletal: Normal range of motion. He exhibits no edema and no tenderness.  He states he was doing some mild exercises and felt a popping sensation, he points to the lateral portion left knee. He has noticed and swelling posteriorly but no locking  Lymphadenopathy:    He has no cervical adenopathy.  Neurological: He is alert and oriented to person, place, and time. He has normal reflexes. No cranial nerve deficit. He exhibits normal muscle tone.  Skin: Skin is warm and dry. No rash noted. No erythema. No pallor.  Psychiatric: He has a normal mood and affect. His behavior is normal. Judgment and thought content normal.          Assessment & Plan:  Healthy male  Coronary disease asymptomatic  Hyperlipidemia continue Lipitor and aspirin  Hypertension continue Zestoretic  Erectile dysfunction continue Viagra when necessary  Sleep dysfunction Flexeril 5 mg each bedtime when necessary  Pain lateral portion left knee sounds like he had a small tear in the cartilage since he is asymptomatic I would just recommend Motrin 600 mg twice daily elevation and ice. He has a sports medicine appointment this afternoon

## 2013-01-02 ENCOUNTER — Encounter: Payer: Self-pay | Admitting: Family Medicine

## 2013-01-02 NOTE — Progress Notes (Signed)
  Subjective:    Patient ID: Dave Robles, male    DOB: [birth year], 67 y.o.   MRN: [id number]  HPI DATE OF INJURY (approx) 12/16/2012  2weeks of left knee pain---mild to moderate. Started after doing some lateral strides as a warm up for tennis---felt a "pop" on his left knee. Was able to play tennis. Day after he had some mild soreness and stiffness that has improved but not gone away. Has not played since. Is oncerned he did something bad to his lknee and wants it checked out before he injures it further  Review of Systems No numbness or weaknes LE.    Objective:   Physical Exam  Vital signs reviewed. GENERAL: Well developed, well nourished, no acute distress. Overweight KNEE"LEFT. Mildy TTP insertion of LCL. LCL is intact to varus stress withmild pain on stress. Otherwise ligamentously intact with neg meniscal signs, calf is soft, FROM in flexion and extension B knees. Distally NV intact, [place]: lateral and medial menisci are well seen and apperar nomral. Small amount of fluid in pseudobursa beneaath the distal LCL but I see no disruption of LCl fibers. QT and PT intact. No fluid duprapatellar puch       Assessment & Plan:  1. Mild LCL sprain. ICE< relative rest and return to activity as tolerated.ACE knee sleeve may e helpful

## 2013-01-08 ENCOUNTER — Telehealth: Payer: Self-pay | Admitting: *Deleted

## 2013-01-08 MED ORDER — DIAZEPAM 10 MG PO TABS: 10.0000 mg | ORAL_TABLET | Freq: Every evening | ORAL | Status: DC | PRN

## 2013-01-08 NOTE — Telephone Encounter (Signed)
Insurance is requesting a prior auth for flexeril.  Per Dr Tawanna Cooler diazepam.  Patient is aware.

## 2013-01-12 ENCOUNTER — Ambulatory Visit (INDEPENDENT_AMBULATORY_CARE_PROVIDER_SITE_OTHER): Payer: Medicare Other | Admitting: Family Medicine

## 2013-01-12 ENCOUNTER — Encounter: Payer: Self-pay | Admitting: Family Medicine

## 2013-01-12 VITALS — BP 138/84 | HR 80 | Ht 70.0 in | Wt 243.0 lb

## 2013-01-12 DIAGNOSIS — M25469 Effusion, unspecified knee: Secondary | ICD-10-CM

## 2013-01-12 DIAGNOSIS — M25569 Pain in unspecified knee: Secondary | ICD-10-CM

## 2013-01-12 DIAGNOSIS — M25562 Pain in left knee: Secondary | ICD-10-CM

## 2013-01-12 DIAGNOSIS — M25462 Effusion, left knee: Secondary | ICD-10-CM

## 2013-01-15 ENCOUNTER — Encounter: Payer: Self-pay | Admitting: Family Medicine

## 2013-01-15 NOTE — Progress Notes (Signed)
  Subjective:    Patient ID: Dave Robles, male    DOB: 1946/09/07, 67 y.o.   MRN: [id number]  HPI Followup left knee. I had seen a few weeks ago after a left knee injury where we diagnosed LCL sprain. He got better with the conservative care and then 2 or 3 days ago noticed that the knee felt stiff again. The pain that he was having on the lateral side is still resolved but now his knee feels stiff particularly when climbing stairs. Has had no other injury that he is aware of. He did try to play tennis again and then he fell to stiff for him to really play very effectively.   Review of Systems He noted no warmth or erythema of the left knee. No pain in the calf. No numbness in the foot.    Objective:   Physical Exam  Vital signs are reviewed GENERAL: Well-developed overweight male no acute distress KNEE: Left. Mild effusion noted on inspection. 4 range of motion in flexion extension. Ligamentously intact to varus and valgus stress. He is no longer tender over the insertion of the LCL. Negative McMurray although this exam was not well tolerated secondary to stiffness. ; Significant amount of fluid in the suprapatellar pouch and in the popliteal space in the form of a Baker cyst. The previous fluid collection over the insertion of the LCL is resolved. Meniscus on the lateral side as well seen and normal. Quadricep and patellar tendons are normal.  INJECTION: Patient was given informed consent, signed copy in the chart. Appropriate time out was taken. Area prepped and draped in usual sterile fashion. 1 cc of methylprednisolone 40 mg/ml plus  4 cc of 1% lidocaine without epinephrine was injected into the left knee using a(n) anterior medial approach. The patient tolerated the procedure well. There were no complications. Post procedure instructions were given.      Assessment & Plan:  #1. Knee effusion. Likely secondary to degenerative meniscus although he could have suffered a meniscal injury  originally. We discussed options including further conservative care, corticosteroid injection, MRI. He just corticosteroid injection and will followup in one to 2 weeks. He's not improving at all he would consider MRI he'll let me know

## 2013-01-23 ENCOUNTER — Ambulatory Visit
Admission: RE | Admit: 2013-01-23 | Discharge: 2013-01-23 | Disposition: A | Discharge status: Home / Self Care | Payer: Medicare Other | Source: Ambulatory Visit | Attending: Family Medicine | Admitting: Family Medicine

## 2013-01-23 ENCOUNTER — Other Ambulatory Visit: Payer: Self-pay | Admitting: *Deleted

## 2013-01-23 DIAGNOSIS — M25562 Pain in left knee: Secondary | ICD-10-CM

## 2013-01-24 ENCOUNTER — Ambulatory Visit
Admission: RE | Admit: 2013-01-24 | Discharge: 2013-01-24 | Disposition: A | Discharge status: Home / Self Care | Payer: Medicare Other | Source: Ambulatory Visit | Attending: Family Medicine | Admitting: Family Medicine

## 2013-01-24 DIAGNOSIS — M25562 Pain in left knee: Secondary | ICD-10-CM

## 2013-01-26 ENCOUNTER — Ambulatory Visit (INDEPENDENT_AMBULATORY_CARE_PROVIDER_SITE_OTHER): Payer: Medicare Other | Admitting: Family Medicine

## 2013-01-26 ENCOUNTER — Encounter: Payer: Self-pay | Admitting: Family Medicine

## 2013-01-26 VITALS — BP 134/82 | Ht 70.0 in | Wt 238.0 lb

## 2013-01-26 DIAGNOSIS — M23201 Derangement of unspecified lateral meniscus due to old tear or injury, left knee: Secondary | ICD-10-CM

## 2013-01-26 DIAGNOSIS — M23302 Other meniscus derangements, unspecified lateral meniscus, unspecified knee: Secondary | ICD-10-CM

## 2013-01-26 DIAGNOSIS — M23209 Derangement of unspecified meniscus due to old tear or injury, unspecified knee: Secondary | ICD-10-CM | POA: Insufficient documentation

## 2013-01-26 NOTE — Progress Notes (Signed)
  Subjective:    Patient ID: Dave Robles, male    DOB: [birth year], 67 y.o.   MRN: [id number]  HPI  F/u left knee--no better Had MRI  Review of Systems     Objective:   Physical Exam  Vital signs reviewed. GENERAL: Well developed, well nourished, no acute distress KNEE Left: small effusion. Popliteal space full dista,ly NV intact MRI: Patellofemoral: Slight thinning of the cartilage of the apex of  the patella.  Medial: Focal areas of grade III-IV chondromalacia of the condyle  and periphery of the lateral tibial plateau.  Lateral: Tiny focal fissure in the posterior central aspect of the  femoral condyle Complete radial tear of the root of the posterior horn of the  medial meniscus with a tiny horizontal tear at the posterior medial  corner.  2. Secondary degenerative changes in the medial compartment with  slight medial bursitis.  3. Moderate joint effusion with Baker's cyst.       Assessment & Plan:  1. KNEEpain with meniscal tear. I really wonder if this is mostly degenerative issues and not an acute tear although he did have effusion. Will refer to ortho--he is not sure he wants surgery bit definitely wants further evaluation and options 2. Discussed weight loss

## 2013-01-26 NOTE — Patient Instructions (Addendum)
DR Thurston Hole TUE MAR 18TH AT 10AM [address] [phone]

## 2013-01-29 ENCOUNTER — Telehealth: Payer: Self-pay | Admitting: Family Medicine

## 2013-01-29 DIAGNOSIS — M23201 Derangement of unspecified lateral meniscus due to old tear or injury, left knee: Secondary | ICD-10-CM

## 2013-01-29 NOTE — Telephone Encounter (Signed)
Caller: Dave Robles/Patient; Phone: [phone]; Reason for Call: Patient is taking Valium for back spasms and insomnia.  Patient is not sleeping well on this medication.  He wants to change back to taking Flexeril.  He is also having issues with his left knee.  He has already had an MRI completed and wants to know who Dr.  Tawanna Cooler would refer him to for the knee problems.  Please call the patient back if he needs to come in for the change in medication.

## 2013-01-30 MED ORDER — CYCLOBENZAPRINE HCL 10 MG PO TABS: ORAL_TABLET | ORAL | Status: DC

## 2013-01-30 NOTE — Telephone Encounter (Signed)
Dave Robles lives switch to Flexeril 10 mg directions one half tab each bedtime when necessary dispensed 30 tabs refills x2 also refer Dave Robles to Dr. Norlene Campbell orthopedist for consult concerning his knee problem

## 2013-01-30 NOTE — Telephone Encounter (Signed)
Rx sent and referral request sent. Left message on machine for patient.

## 2013-02-06 ENCOUNTER — Telehealth: Payer: Self-pay | Admitting: Family Medicine

## 2013-02-06 NOTE — Telephone Encounter (Signed)
Returning call to patient about "dizzy spell" 02/05/13, and cardiac history.  On callback, unable to reach patient at number given; message left on voicemail to contact office for assistance.  krs/can

## 2013-02-07 ENCOUNTER — Telehealth: Payer: Self-pay | Admitting: Family Medicine

## 2013-02-07 NOTE — Telephone Encounter (Signed)
Patient coming in for office visit with Dr Caryl Never

## 2013-02-07 NOTE — Telephone Encounter (Signed)
Patient calling back, no call back from nurse. He is getting upset.jms/can

## 2013-02-07 NOTE — Telephone Encounter (Signed)
Caller: Jerrit/Patient; Phone: [phone]; Reason for Call: Calling to speak to [facility].  Jms/can

## 2013-02-07 NOTE — Telephone Encounter (Signed)
Pt called and states he is experiencing dizzy spell (abour 3 sec) per pt felt it coming on and not sure if he blacked out or what.  Pt states given his cardiac history.  Pt states he seen the cardiologist last fall.  Pt had a physical with Dr. Tawanna Cooler in Feb.   Appt scheduled with dr. Caryl Never on 02/08/13 at 9:30

## 2013-02-08 ENCOUNTER — Ambulatory Visit (INDEPENDENT_AMBULATORY_CARE_PROVIDER_SITE_OTHER): Payer: Medicare Other | Admitting: Family Medicine

## 2013-02-08 ENCOUNTER — Telehealth: Payer: Self-pay | Admitting: *Deleted

## 2013-02-08 VITALS — BP 140/78 | Temp 99.1°F | Wt 240.0 lb

## 2013-02-08 DIAGNOSIS — I2510 Atherosclerotic heart disease of native coronary artery without angina pectoris: Secondary | ICD-10-CM

## 2013-02-08 DIAGNOSIS — R42 Dizziness and giddiness: Secondary | ICD-10-CM

## 2013-02-08 NOTE — Patient Instructions (Addendum)
Follow up immediately for any chest pain or any recurrent dizziness.

## 2013-02-08 NOTE — Telephone Encounter (Signed)
Pt here for OV with Dr Caryl Never.  He is questioning if Dr Tawanna Cooler will say OK to taking generic Flexeril 10 mg at bedtime.  He was instructed to take 1/2 at bedtime and he would like to take 1 tab.  He reports he is only sleeping good 1/2 of the night.

## 2013-02-08 NOTE — Progress Notes (Signed)
  Subjective:    Patient ID: Dave Robles, male    DOB: 12-26-1945, 67 y.o.   MRN: [id number]  HPI  Acute visit.  Monday driving home episode of acute dizziness about 30 minutes after exercise. No clear LOC but felt "light headed". Exercised without difficulty that day. Symptoms were very brief.  No episodes since then. Generally exercises vigorously several days per week without difficulty No focal weakness or confusion. Denies vertigo.  No recent speech changes.  Hx of CAD. No recent chest pain.   Meds reviewed.  No beta blocker use.  Past Medical History  Diagnosis Date  . HYPERLIPIDEMIA 07/10/2007  . ERECTILE DYSFUNCTION 07/10/2007  . TOBACCO USE 12/15/2009  . Adjustment disorder with depressed mood 03/04/2009  . HYPERTENSION 07/10/2007  . CORONARY ARTERY DISEASE 07/10/2007  . Pneumonia due to Mycoplasma pneumoniae 09/10/2008  . Extrinsic asthma, unspecified 09/10/2008   Past Surgical History  Procedure Laterality Date  . Coronary artery bypass graft  11/15/89  . Inguinal hernia repair      reports that he quit smoking about 3 years ago. He has never used smokeless tobacco. He reports that he drinks about 3.0 ounces of alcohol per week. He reports that he does not use illicit drugs. family history includes Coronary artery disease in his father; Hyperlipidemia in his other; and Hypertension in his other. Allergies  Allergen Reactions  . Hydrocodone-Homatropine     REACTION: hives     Review of Systems  Constitutional: Negative for fever and chills.  Eyes: Negative for visual disturbance.  Respiratory: Negative for cough and shortness of breath.   Cardiovascular: Negative for chest pain, palpitations and leg swelling.  Gastrointestinal: Negative for nausea, vomiting, abdominal pain and diarrhea.  Genitourinary: Negative for dysuria.  Neurological: Positive for dizziness. Negative for headaches.  Psychiatric/Behavioral: Negative for confusion.       Objective:   Physical Exam  Constitutional: He is oriented to person, place, and time. He appears well-developed and well-nourished.  Neck:  No carotid bruits  Cardiovascular: Normal rate and regular rhythm.   Pulmonary/Chest: Effort normal and breath sounds normal. No respiratory distress. He has no wheezes. He has no rales.  Neurological: He is alert and oriented to person, place, and time. No cranial nerve deficit. Coordination normal.          Assessment & Plan:  Transient dizziness following exercise.Marland Kitchen No clear syncope. Blood pressure stable with no orthostatic change. EKG reveals sinus rhythm with rate around 72 and no acute abnormalities. Given his cardiac history we'll schedule followup with cardiologist to further evaluate.

## 2013-02-08 NOTE — Telephone Encounter (Signed)
Okay to take a 10 mg Flexeril each bedtime. Let's make sure he has a year supply

## 2013-02-12 ENCOUNTER — Encounter: Payer: Self-pay | Admitting: *Deleted

## 2013-02-12 ENCOUNTER — Other Ambulatory Visit: Payer: Self-pay | Admitting: *Deleted

## 2013-02-12 MED ORDER — CYCLOBENZAPRINE HCL 10 MG PO TABS: 10.0000 mg | ORAL_TABLET | Freq: Every evening | ORAL | Status: DC | PRN

## 2013-02-12 NOTE — Telephone Encounter (Signed)
Rx sent 

## 2013-02-12 NOTE — Telephone Encounter (Signed)
Pt states the cyclobenzaprine (FLEXERIL) 10 MG tablet was not at the pharmacy. Pls advise [facility] [facility] [employer]

## 2013-02-12 NOTE — Patient Instructions (Signed)
PER PT REQUEST: DR ANDREW COLLINS [address] [place] [phone] APR 21ST 2014 AT 245PM

## 2013-02-26 ENCOUNTER — Ambulatory Visit (INDEPENDENT_AMBULATORY_CARE_PROVIDER_SITE_OTHER): Payer: Medicare Other | Admitting: Nurse Practitioner

## 2013-02-26 ENCOUNTER — Encounter: Payer: Self-pay | Admitting: Nurse Practitioner

## 2013-02-26 VITALS — BP 136/78 | HR 86 | Ht 70.0 in | Wt 239.8 lb

## 2013-02-26 DIAGNOSIS — I25810 Atherosclerosis of coronary artery bypass graft(s) without angina pectoris: Secondary | ICD-10-CM

## 2013-02-26 NOTE — Progress Notes (Signed)
Dave Robles Date of Birth: [birth year] Medical Record #[id number]  History of Present Illness: Dave Robles is seen back today for a work in visit.  He is seen for Dr. Shirlee Latch. He has HTN, HLD, CAD with past CABG in 1991, negative Myoview in 2011 with EF 67% and no ischemia, ED, tobacco abuse, asthma and depression.   Last seen in November. Seemed to be doing ok.   He is here today. He is here alone. This is an early visit back for him. About 6 weeks ago, he had a spell about 30 minutes after exercising, where he was driving, got lightheaded, was "out" for just a second or two. Did not wreck the car. Called here but could not get an appointment until today. Saw an associate of Dr. Nelida Meuse. EKG was ok. No more spells. No chest pain. His bypass was back in 1991. Presented then with worsening dyspnea. Never really had chest pain. Stress test 3 years ago. Has not been as active this year as he usually is. Limited by his knee and is planning on surgery next month (not scheduled yet). Still has issues with his weight. Not smoking. He notes that the only thing out of the ordinary is that his insurance wanted him to try Valium in the place of the Flexeril - which he takes mainly to sleep - and did not tolerate. He is back on the Flexeril.   Current Outpatient Prescriptions on File Prior to Visit  Medication Sig Dispense Refill  . aspirin 81 MG tablet Take 81 mg by mouth daily.        Marland Kitchen atorvastatin (LIPITOR) 80 MG tablet TAKE 1 TABLET (80 MG TOTAL) BY MOUTH DAILY.  100 tablet  3  . cyclobenzaprine (FLEXERIL) 10 MG tablet Take 1 tablet (10 mg total) by mouth at bedtime as needed for muscle spasms.  90 tablet  1  . lisinopril-hydrochlorothiazide (PRINZIDE,ZESTORETIC) 20-12.5 MG per tablet Take 1 tablet by mouth daily.  100 tablet  3  . meloxicam (MOBIC) 15 MG tablet Take 15 mg by mouth as needed. Per Dave Robles      . Multiple Vitamin (MULTIVITAMIN) capsule Take 1 capsule by mouth daily.        .  nitroGLYCERIN (NITROSTAT) 0.4 MG SL tablet Place 1 tablet (0.4 mg total) under the tongue every 5 (five) minutes as needed.  25 tablet  1  . sildenafil (VIAGRA) 100 MG tablet Take 1 tablet (100 mg total) by mouth daily as needed.  6 tablet  11   No current facility-administered medications on file prior to visit.    Allergies  Allergen Reactions  . Hydrocodone-Homatropine     REACTION: hives    Past Medical History  Diagnosis Date  . HYPERLIPIDEMIA 07/10/2007  . ERECTILE DYSFUNCTION 07/10/2007  . TOBACCO USE 12/15/2009  . Adjustment disorder with depressed mood 03/04/2009  . HYPERTENSION 07/10/2007  . CORONARY ARTERY DISEASE 07/10/2007  . Pneumonia due to Mycoplasma pneumoniae 09/10/2008  . Extrinsic asthma, unspecified 09/10/2008    Past Surgical History  Procedure Laterality Date  . Coronary artery bypass graft  11/15/89  . Inguinal hernia repair      History  Smoking status  . Former Smoker -- 0.50 packs/day  . Quit date: 01/13/2010  Smokeless tobacco  . Never Used    History  Alcohol Use  . 3.0 oz/week  . 5 Cans of beer per week    Family History  Problem Relation Age of Onset  . Coronary  artery disease Father     cabg  . Hyperlipidemia Other   . Hypertension Other     Review of Systems: The review of systems is per the HPI.  All other systems were reviewed and are negative.  Physical Exam: BP 136/78  Pulse 86  Ht 5\' 10"  (1.778 m)  Wt 239 lb 12.8 oz (108.773 kg)  BMI 34.41 kg/m2 Patient is very pleasant and in no acute distress. He is obese. Weight is unchanged.  Skin is warm and dry. Color is normal.  HEENT is unremarkable. Normocephalic/atraumatic. PERRL. Sclera are nonicteric. Neck is supple. No masses. No JVD. Lungs are clear. Cardiac exam shows a regular rate and rhythm. Abdomen is soft. Extremities are without edema. Gait and ROM are intact. No gross neurologic deficits noted.  LABORATORY DATA: EKG was reviewed and was normal.   Lab Results    Component Value Date   WBC 7.1 12/14/2012   HGB 15.8 12/14/2012   HCT 47.1 12/14/2012   PLT 165.0 12/14/2012   GLUCOSE 88 12/14/2012   CHOL 125 12/14/2012   TRIG 112.0 12/14/2012   HDL 29.50* 12/14/2012   LDLDIRECT 66.7 11/30/2011   LDLCALC 73 12/14/2012   ALT 30 12/14/2012   AST 23 12/14/2012   NA 138 12/14/2012   K 4.0 12/14/2012   CL 103 12/14/2012   CREATININE 1.2 12/14/2012   BUN 19 12/14/2012   CO2 27 12/14/2012   TSH 1.58 12/14/2012   PSA 1.08 12/14/2012    Assessment / Plan: 1. Syncope/lighteadedness - of very brief duration - this happened 6 weeks ago - has not had recurrence. Not felt to be from his blood sugar. Will update his Myoview. Tentatively see him back at his regular time unless the stress test is abnormal.   2. CAD - has had remote CABG in 1991. No history of follow up cath. Last stress test was 3 years ago - will update. Will arrange for stress Myoview and may have to change to Lexiscan if his knee gives him an issue.   3. Obesity  4. HLD  Patient is agreeable to this plan and will call if any problems develop in the interim.   Rosalio Macadamia, RN, ANP-C [facility] [address] [place], [place]  [address]

## 2013-02-26 NOTE — Patient Instructions (Addendum)
We are going to arrange for a stress Myoview  Stay on your current medicines  See Dr. Shirlee Latch as planned - unless your test is abnormal - we will see you back sooner  Call the [facility] office at [phone] if you have any questions, problems or concerns.

## 2013-03-06 ENCOUNTER — Ambulatory Visit (HOSPITAL_COMMUNITY): Payer: Medicare Other | Attending: Cardiovascular Disease | Admitting: Radiology

## 2013-03-06 VITALS — BP 137/78 | HR 70 | Ht 70.0 in | Wt 236.0 lb

## 2013-03-06 DIAGNOSIS — E785 Hyperlipidemia, unspecified: Secondary | ICD-10-CM | POA: Insufficient documentation

## 2013-03-06 DIAGNOSIS — E669 Obesity, unspecified: Secondary | ICD-10-CM | POA: Insufficient documentation

## 2013-03-06 DIAGNOSIS — Z01810 Encounter for preprocedural cardiovascular examination: Secondary | ICD-10-CM | POA: Insufficient documentation

## 2013-03-06 DIAGNOSIS — I10 Essential (primary) hypertension: Secondary | ICD-10-CM | POA: Insufficient documentation

## 2013-03-06 DIAGNOSIS — I25810 Atherosclerosis of coronary artery bypass graft(s) without angina pectoris: Secondary | ICD-10-CM

## 2013-03-06 DIAGNOSIS — Z951 Presence of aortocoronary bypass graft: Secondary | ICD-10-CM | POA: Insufficient documentation

## 2013-03-06 DIAGNOSIS — R42 Dizziness and giddiness: Secondary | ICD-10-CM | POA: Insufficient documentation

## 2013-03-06 DIAGNOSIS — I2510 Atherosclerotic heart disease of native coronary artery without angina pectoris: Secondary | ICD-10-CM

## 2013-03-06 DIAGNOSIS — R55 Syncope and collapse: Secondary | ICD-10-CM

## 2013-03-06 DIAGNOSIS — Z8249 Family history of ischemic heart disease and other diseases of the circulatory system: Secondary | ICD-10-CM | POA: Insufficient documentation

## 2013-03-06 DIAGNOSIS — Z87891 Personal history of nicotine dependence: Secondary | ICD-10-CM | POA: Insufficient documentation

## 2013-03-06 DIAGNOSIS — I4949 Other premature depolarization: Secondary | ICD-10-CM

## 2013-03-06 MED ORDER — TECHNETIUM TC 99M SESTAMIBI GENERIC - CARDIOLITE
30.0000 | Freq: Once | INTRAVENOUS | Status: AC | PRN
  Administered 2013-03-06: 30 via INTRAVENOUS

## 2013-03-06 MED ORDER — TECHNETIUM TC 99M SESTAMIBI GENERIC - CARDIOLITE
10.0000 | Freq: Once | INTRAVENOUS | Status: AC | PRN
  Administered 2013-03-06: 10 via INTRAVENOUS

## 2013-03-06 NOTE — Progress Notes (Signed)
[facility] SITE 3 NUCLEAR MED [address] [place], [place] [address] [phone]    Cardiology Nuclear Med Study  Dave Robles is a 67 y.o. male     MRN : [id number]     DOB: May 26, 1946  Procedure Date: 03/06/2013  Nuclear Med Background Indication for Stress Test:  Evaluation for Ischemia, Graft Patency and Clearance for Pending  (L)Knee Surgery on 04/10/13 by Dr. Valma Cava History:  '91 CABG; '11 MPS:no ischemia, EF=67% Cardiac Risk Factors: Family History - CAD, History of Smoking, Hypertension, Lipids, Obesity and Smoker  Symptoms:  Light-Headedness and Near Syncope after Exercise while Driving   Nuclear Pre-Procedure Caffeine/Decaff Intake:  None > 12 hrs NPO After: 7:30pm   Lungs:  Clear. O2 Sat: 96% on room air. IV 0.9% NS with Angio Cath:  22g  IV Site: R Antecubital x 1, tolerated well IV Started by:  Irean Hong, RN  Chest Size (in):  50 Cup Size: n/a  Height: 5\' 10"  (1.778 m)  Weight:  236 lb (107.049 kg)  BMI:  Body mass index is 33.86 kg/(m^2). Tech Comments:  Took Lisinopril-Hctz this am    Nuclear Med Study 1 or 2 day study: 1 day  Stress Test Type:  Stress  Reading MD: Kristeen Miss, MD  Order Authorizing Provider:  Marca Ancona,  MD, and [name], NP  Resting Radionuclide: Technetium 69m Sestamibi  Resting Radionuclide Dose: 11.0 mCi   Stress Radionuclide:  Technetium 30m Sestamibi  Stress Radionuclide Dose: 32.9 mCi           Stress Protocol Rest HR: 70 Stress HR: 151  Rest BP: 137/78 Stress BP: 199/75  Exercise Time (min): 8:01 METS: 10.0   Predicted Max HR: 155 bpm % Max HR: 97.42 bpm Rate Pressure Product: 46962   Dose of Adenosine (mg):  n/a Dose of Lexiscan: n/a mg  Dose of Atropine (mg): n/a Dose of Dobutamine: n/a mcg/kg/min (at max HR)  Stress Test Technologist: Smiley Houseman, CMA-N  Nuclear Technologist:  Domenic Polite, CNMT     Rest Procedure:  Myocardial perfusion imaging was performed at rest 45  minutes following the intravenous administration of Technetium 77m Sestamibi.  Rest ECG: NSR - Normal EKG  Stress Procedure:  The patient exercised on the treadmill utilizing the Bruce Protocol for 8:01 minutes. The patient stopped due to fatigue and denied any chest pain.  Technetium 26m Sestamibi was injected at peak exercise and myocardial perfusion imaging was performed after a brief delay.  Stress ECG: No significant change from baseline ECG  QPS Raw Data Images:  Normal; no motion artifact; normal heart/lung ratio. Stress Images:  Normal homogeneous uptake in all areas of the myocardium. Rest Images:  Normal homogeneous uptake in all areas of the myocardium. Subtraction (SDS):  No evidence of ischemia. Transient Ischemic Dilatation (Normal <1.22):  0.81 Lung/Heart Ratio (Normal <0.45):  0.36  Quantitative Gated Spect Images QGS EDV:  63 ml QGS ESV:  19 ml  Impression Exercise Capacity:  Good exercise capacity. BP Response:  Normal blood pressure response. Clinical Symptoms:  No significant symptoms noted. ECG Impression:  No significant ST segment change suggestive of ischemia. Comparison with Prior Nuclear Study: No significant change from previous study 12/24/09.  Overall Impression:  Normal stress nuclear study.  No evidence of ischemia.    LV Ejection Fraction: 70%.  LV Wall Motion:  NL LV Function; NL Wall Motion.   Vesta Mixer, Montez Hageman., MD, [facility] 03/06/2013, 6:12 PM Office - [phone]  Pager [phone]

## 2013-03-08 ENCOUNTER — Encounter (HOSPITAL_COMMUNITY): Payer: Medicare Other

## 2013-05-08 ENCOUNTER — Encounter: Payer: Self-pay | Admitting: Family Medicine

## 2013-05-08 ENCOUNTER — Ambulatory Visit (INDEPENDENT_AMBULATORY_CARE_PROVIDER_SITE_OTHER): Payer: Medicare Other | Admitting: Family Medicine

## 2013-05-08 VITALS — BP 120/80 | Temp 99.4°F | Wt 242.0 lb

## 2013-05-08 DIAGNOSIS — J45909 Unspecified asthma, uncomplicated: Secondary | ICD-10-CM

## 2013-05-08 MED ORDER — METHYLPREDNISOLONE ACETATE 80 MG/ML IJ SUSP
120.0000 mg | Freq: Once | INTRAMUSCULAR | Status: AC
  Administered 2013-05-08: 80 mg via INTRAMUSCULAR

## 2013-05-08 MED ORDER — BECLOMETHASONE DIPROPIONATE 40 MCG/ACT IN AERS: 2.0000 | INHALATION_SPRAY | Freq: Two times a day (BID) | RESPIRATORY_TRACT | Status: DC

## 2013-05-08 MED ORDER — CYCLOBENZAPRINE HCL 10 MG PO TABS: 10.0000 mg | ORAL_TABLET | Freq: Every evening | ORAL | Status: DC | PRN

## 2013-05-08 NOTE — Patient Instructions (Addendum)
Qvar 40,,,,,,,,,,, 2 puffs twice daily until your symptoms have abated and then one puff at bedtime for 3 weeks  We will give you a shot of steroids  Drink lots of water    Return when necessary

## 2013-05-08 NOTE — Progress Notes (Signed)
  Subjective:    Patient ID: [name], male    DOB: 07/17/1946, 67 y.o.   MRN: [id number]  HPI  Demetrio is a 67 year old male who comes in today for evaluation of a cough  He states he was traveling in about a week ago developed a cough. No fever chills earache sore throat. He's had a history of asthma in the past. He took prednisone last week 60 mg 40 mg 20 10 mg,,,,,,,,,,,,,,,,. It helps somewhat but it gave him insomnia. No sputum production otherwise feels well  Review of Systems Review of systems negative except for history of extrinsic asthma    Objective:   Physical Exam Well-developed well-nourished male in no acute distress examination of the HEENT negative neck was supple no adenopathy lungs show symmetrical breath sounds late mild expiratory wheezing on forced expiration       Assessment & Plan:  Mild asthma plan a Depakote shot because of the insomnia that he gets from oral steroids also inhaled steroids Qvar 2 puffs twice a day until clear

## 2013-05-08 NOTE — Addendum Note (Signed)
Addended by: Kern Reap B on: 05/08/2013 12:35 PM   Modules accepted: Orders

## 2013-06-11 ENCOUNTER — Telehealth: Payer: Self-pay | Admitting: Family Medicine

## 2013-06-11 MED ORDER — CYCLOBENZAPRINE HCL 10 MG PO TABS: 10.0000 mg | ORAL_TABLET | Freq: Every evening | ORAL | Status: DC | PRN

## 2013-06-11 NOTE — Telephone Encounter (Signed)
Pt's insurance is requesting prior aut hon cyclobenzaprine (FLEXERIL) 10 MG tablet He does not want to wait for that, as he is out of meds. He would like you to send the 90-day rx to [facility] on [address], because he can pay $10 cash for it there, and forget sending it to insurance.  Pt would like it to be done today, if any way possible. Please (if you can) note in the rx that pt wishes to pay cash, no insurance. Thanks.

## 2013-06-20 ENCOUNTER — Other Ambulatory Visit: Payer: Self-pay

## 2013-09-20 ENCOUNTER — Other Ambulatory Visit: Payer: Self-pay

## 2013-11-09 ENCOUNTER — Other Ambulatory Visit: Payer: Self-pay | Admitting: Family Medicine

## 2013-12-05 ENCOUNTER — Other Ambulatory Visit: Payer: Self-pay | Admitting: Family Medicine

## 2013-12-10 ENCOUNTER — Encounter: Payer: Self-pay | Admitting: Internal Medicine

## 2013-12-27 ENCOUNTER — Other Ambulatory Visit (INDEPENDENT_AMBULATORY_CARE_PROVIDER_SITE_OTHER): Payer: Medicare Other

## 2013-12-27 DIAGNOSIS — Z0000 Encounter for general adult medical examination without abnormal findings: Secondary | ICD-10-CM

## 2013-12-27 DIAGNOSIS — E785 Hyperlipidemia, unspecified: Secondary | ICD-10-CM

## 2013-12-27 DIAGNOSIS — I2510 Atherosclerotic heart disease of native coronary artery without angina pectoris: Secondary | ICD-10-CM

## 2013-12-27 DIAGNOSIS — I10 Essential (primary) hypertension: Secondary | ICD-10-CM

## 2013-12-27 LAB — CBC WITH DIFFERENTIAL/PLATELET
BASOS PCT: 0.9 % (ref 0.0–3.0)
Basophils Absolute: 0.1 10*3/uL (ref 0.0–0.1)
EOS PCT: 4.3 % (ref 0.0–5.0)
Eosinophils Absolute: 0.3 10*3/uL (ref 0.0–0.7)
HEMATOCRIT: 50.6 % (ref 39.0–52.0)
HEMOGLOBIN: 16.7 g/dL (ref 13.0–17.0)
Lymphocytes Relative: 28.6 % (ref 12.0–46.0)
Lymphs Abs: 2.1 10*3/uL (ref 0.7–4.0)
MCHC: 33 g/dL (ref 30.0–36.0)
MCV: 96.3 fl (ref 78.0–100.0)
MONO ABS: 0.7 10*3/uL (ref 0.1–1.0)
Monocytes Relative: 9.5 % (ref 3.0–12.0)
NEUTROS ABS: 4.2 10*3/uL (ref 1.4–7.7)
NEUTROS PCT: 56.7 % (ref 43.0–77.0)
Platelets: 167 10*3/uL (ref 150.0–400.0)
RBC: 5.25 Mil/uL (ref 4.22–5.81)
RDW: 13.7 % (ref 11.5–14.6)
WBC: 7.5 10*3/uL (ref 4.5–10.5)

## 2013-12-27 LAB — POCT URINALYSIS DIPSTICK
BILIRUBIN UA: NEGATIVE
Glucose, UA: NEGATIVE
KETONES UA: NEGATIVE
Leukocytes, UA: NEGATIVE
NITRITE UA: NEGATIVE
PH UA: 5.5
Protein, UA: NEGATIVE
Spec Grav, UA: 1.015
Urobilinogen, UA: 1

## 2013-12-27 LAB — BASIC METABOLIC PANEL
BUN: 18 mg/dL (ref 6–23)
CHLORIDE: 104 meq/L (ref 96–112)
CO2: 29 meq/L (ref 19–32)
CREATININE: 1.2 mg/dL (ref 0.4–1.5)
Calcium: 9.1 mg/dL (ref 8.4–10.5)
GFR: 62.47 mL/min (ref >60.00)
Glucose, Bld: 81 mg/dL (ref 70–99)
POTASSIUM: 3.9 meq/L (ref 3.5–5.1)
SODIUM: 140 meq/L (ref 135–145)

## 2013-12-27 LAB — HEPATIC FUNCTION PANEL
ALBUMIN: 3.9 g/dL (ref 3.5–5.2)
ALT: 25 U/L (ref 0–53)
AST: 26 U/L (ref 0–37)
Alkaline Phosphatase: 77 U/L (ref 39–117)
BILIRUBIN DIRECT: 0.2 mg/dL (ref 0.0–0.3)
TOTAL PROTEIN: 6.6 g/dL (ref 6.0–8.3)
Total Bilirubin: 1.1 mg/dL (ref 0.3–1.2)

## 2013-12-27 LAB — LIPID PANEL
Cholesterol: 128 mg/dL (ref 0–200)
HDL: 30.8 mg/dL — ABNORMAL LOW (ref >39.00)
LDL Cholesterol: 81 mg/dL (ref 0–99)
TRIGLYCERIDES: 82 mg/dL (ref 0.0–149.0)
Total CHOL/HDL Ratio: 4
VLDL: 16.4 mg/dL (ref 0.0–40.0)

## 2013-12-27 LAB — TSH: TSH: 1.76 u[IU]/mL (ref 0.35–5.50)

## 2013-12-27 LAB — PSA: PSA: 1.02 ng/mL (ref 0.10–4.00)

## 2014-01-03 ENCOUNTER — Ambulatory Visit (INDEPENDENT_AMBULATORY_CARE_PROVIDER_SITE_OTHER): Payer: Medicare Other | Admitting: Family Medicine

## 2014-01-03 ENCOUNTER — Encounter: Payer: Self-pay | Admitting: Family Medicine

## 2014-01-03 VITALS — BP 120/70 | Temp 98.6°F | Ht 69.5 in | Wt 240.0 lb

## 2014-01-03 DIAGNOSIS — Z23 Encounter for immunization: Secondary | ICD-10-CM

## 2014-01-03 DIAGNOSIS — I10 Essential (primary) hypertension: Secondary | ICD-10-CM

## 2014-01-03 DIAGNOSIS — F528 Other sexual dysfunction not due to a substance or known physiological condition: Secondary | ICD-10-CM

## 2014-01-03 DIAGNOSIS — I2510 Atherosclerotic heart disease of native coronary artery without angina pectoris: Secondary | ICD-10-CM

## 2014-01-03 DIAGNOSIS — E785 Hyperlipidemia, unspecified: Secondary | ICD-10-CM

## 2014-01-03 MED ORDER — ATORVASTATIN CALCIUM 80 MG PO TABS: ORAL_TABLET | ORAL | Status: DC

## 2014-01-03 MED ORDER — CYCLOBENZAPRINE HCL 10 MG PO TABS: ORAL_TABLET | ORAL | Status: DC

## 2014-01-03 MED ORDER — NITROGLYCERIN 0.4 MG SL SUBL: 0.4000 mg | SUBLINGUAL_TABLET | SUBLINGUAL | Status: DC | PRN

## 2014-01-03 MED ORDER — SILDENAFIL CITRATE 100 MG PO TABS: 100.0000 mg | ORAL_TABLET | Freq: Every day | ORAL | Status: DC | PRN

## 2014-01-03 MED ORDER — LISINOPRIL-HYDROCHLOROTHIAZIDE 20-12.5 MG PO TABS: 1.0000 | ORAL_TABLET | Freq: Every day | ORAL | Status: DC

## 2014-01-03 NOTE — Patient Instructions (Signed)
Decrease the lisinopril........... one half tab daily in the morning  Check your blood pressure daily in the morning for 4 weeks  Blood pressure goal 135/85 or less  Return in one year for general physical sooner if any problems  [email or web address]

## 2014-01-03 NOTE — Progress Notes (Signed)
Subjective:    Patient ID: Dave Robles, male    DOB: [birth year], 68 y.o.   MRN: [id number]  HPI Dave Robles is a 68 year old married male nonsmoker who comes in today for a Medicare wellness examination because of a history of hyperlipidemia, back pain, hypertension, erectile dysfunction, and coronary artery disease  Had bypass surgery in 1991  A year ago at this time he had a syncopal episode. He says he was at gym exercising felt fine driving home skin every lightheaded and passed out for a few seconds. He underwent a complete cardiovascular evaluation which was normal. BP today 120/70.  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 He's going to [place] this coming summer for [employer] on a boat cruise  Cognitive function normal he exercises on a regular basis home health safety reviewed no issues identified, no guns in the house, he does have a health care power of attorney and living well  Medication list reviewed there've been no changes.    Review of Systems  Constitutional: Negative.   HENT: Negative.   Eyes: Negative.   Respiratory: Negative.   Cardiovascular: Negative.   Gastrointestinal: Negative.   Endocrine: Negative.   Genitourinary: Negative.   Musculoskeletal: Negative.   Skin: Negative.   Allergic/Immunologic: Negative.   Neurological: Negative.   Hematological: Negative.   Psychiatric/Behavioral: Negative.        Objective:   Physical Exam  Nursing note and vitals reviewed. Constitutional: He is oriented to person, place, and time. He appears well-developed and well-nourished.  HENT:  Head: Normocephalic and atraumatic.  Right Ear: External ear normal.  Left Ear: External ear normal.  Nose: Nose normal.  Mouth/Throat: Oropharynx is clear and moist.  Eyes: Conjunctivae and EOM are  normal. Pupils are equal, round, and reactive to light.  Neck: Normal range of motion. Neck supple. No JVD present. No tracheal deviation present. No thyromegaly present.  Cardiovascular: Normal rate, regular rhythm, normal heart sounds and intact distal pulses.  Exam reveals no gallop and no friction rub.   No murmur heard. No carotid artery bruits peripheral pulses 2+ and symmetrical. Scar left lower calf from previous vein harvest for bypass surgery in 1991  Pulmonary/Chest: Effort normal and breath sounds normal. No stridor. No respiratory distress. He has no wheezes. He has no rales. He exhibits no tenderness.  Abdominal: Soft. Bowel sounds are normal. He exhibits no distension and no mass. There is no tenderness. There is no rebound and no guarding.  Genitourinary: Rectum normal, prostate normal and penis normal. Guaiac negative stool. No penile tenderness.  Musculoskeletal: Normal range of motion. He exhibits no edema and no tenderness.  Lymphadenopathy:    He has no cervical adenopathy.  Neurological: He is alert and oriented to person, place, and time. He has normal reflexes. No cranial nerve deficit. He exhibits normal muscle tone.  Skin: Skin is warm and dry. No rash noted. No erythema. No pallor.  Psychiatric: He has a normal mood and affect. His behavior is normal. Judgment and thought content normal.          Assessment & Plan:   coronary disease asymptomatic  Hypertension at goal continue Lipitor and aspirin  Hypertension continue lisinopril but cut dose in half because of the episode of syncope after exercise. BP check daily followup in 4  weeks  Erectile dysfunction refill Viagra  Back pain Flexeril when necessary  Overweight again talked about diet exercise and weight loss

## 2014-01-03 NOTE — Progress Notes (Signed)
Pre visit review using our clinic review tool, if applicable. No additional management support is needed unless otherwise documented below in the visit note. 

## 2014-01-04 ENCOUNTER — Telehealth: Payer: Self-pay | Admitting: Family Medicine

## 2014-01-04 NOTE — Telephone Encounter (Signed)
Relevant patient education assigned to patient using Emmi. ° °

## 2014-02-25 ENCOUNTER — Telehealth: Payer: Self-pay | Admitting: Family Medicine

## 2014-02-25 DIAGNOSIS — Z23 Encounter for immunization: Secondary | ICD-10-CM

## 2014-02-25 MED ORDER — CYCLOBENZAPRINE HCL 10 MG PO TABS: ORAL_TABLET | ORAL | Status: DC

## 2014-02-25 NOTE — Telephone Encounter (Signed)
Pt would like his RX sent to [facility] on [address] cyclobenzaprine (FLEXERIL) 10 MG tablet

## 2014-04-16 ENCOUNTER — Encounter: Payer: Self-pay | Admitting: Internal Medicine

## 2014-05-14 ENCOUNTER — Encounter: Payer: Self-pay | Admitting: Podiatry

## 2014-05-14 ENCOUNTER — Ambulatory Visit (INDEPENDENT_AMBULATORY_CARE_PROVIDER_SITE_OTHER): Payer: BC Managed Care – HMO | Admitting: Podiatry

## 2014-05-14 VITALS — BP 141/82 | HR 92 | Ht 70.0 in | Wt 230.0 lb

## 2014-05-14 DIAGNOSIS — B351 Tinea unguium: Secondary | ICD-10-CM | POA: Insufficient documentation

## 2014-05-14 HISTORY — DX: Tinea unguium: B35.1

## 2014-05-14 NOTE — Patient Instructions (Signed)
Seen for fungal nail on left great toe. May benefit from open toed shoes, dandruff shampoo scrub, OTC anti fungal medication. Return as needed.

## 2014-05-14 NOTE — Progress Notes (Signed)
Subjective: Fungal nail on left great toe.   Objective: Vascular: All pedal pulses are palpable. Dermatologic: Fungal nail with loose nail plate distal 3/4 left great toe. Neurologic: All epicritic and tactile sensations grossly intact. Orthopedic: Sagittal plane excess motion first ray left.  Assessment: Onychomycosis left hallux. Hallux limitus left. Metatarsus primus elevatus left.   Plan: Reviewed findings and available options.  May benefit from open toed shoes, dandruff shampoo scrub, OTC anti fungal medication. Explained benefit of Metatarsal binder.  Return as needed.

## 2014-06-24 ENCOUNTER — Ambulatory Visit (AMBULATORY_SURGERY_CENTER): Payer: Self-pay | Admitting: *Deleted

## 2014-06-24 VITALS — Ht 70.0 in | Wt 238.6 lb

## 2014-06-24 DIAGNOSIS — Z860100 Personal history of colon polyps, unspecified: Secondary | ICD-10-CM

## 2014-06-24 NOTE — Progress Notes (Signed)
No allergies to eggs or soy. No problems with anesthesia.  Pt given Emmi instructions for colonoscopy  No oxygen use  No diet drug use  Insurance does not cover SuPrep; pt given Miralax split dose prep

## 2014-07-02 ENCOUNTER — Encounter: Payer: Self-pay | Admitting: Internal Medicine

## 2014-07-08 ENCOUNTER — Encounter: Payer: Self-pay | Admitting: Internal Medicine

## 2014-07-08 ENCOUNTER — Ambulatory Visit (AMBULATORY_SURGERY_CENTER): Payer: Medicare Other | Admitting: Internal Medicine

## 2014-07-08 ENCOUNTER — Telehealth: Payer: Self-pay | Admitting: Internal Medicine

## 2014-07-08 VITALS — BP 118/76 | HR 67 | Temp 96.9°F | Resp 16 | Ht 70.0 in | Wt 238.0 lb

## 2014-07-08 DIAGNOSIS — K5730 Diverticulosis of large intestine without perforation or abscess without bleeding: Secondary | ICD-10-CM

## 2014-07-08 DIAGNOSIS — Z860100 Personal history of colon polyps, unspecified: Secondary | ICD-10-CM

## 2014-07-08 HISTORY — DX: Personal history of colonic polyps: Z86.010

## 2014-07-08 HISTORY — DX: Personal history of colon polyps, unspecified: Z86.0100

## 2014-07-08 MED ORDER — SODIUM CHLORIDE 0.9 % IV SOLN: 500.0000 mL | INTRAVENOUS | Status: DC

## 2014-07-08 NOTE — Telephone Encounter (Signed)
This encounter was created in error - please disregard.

## 2014-07-08 NOTE — Progress Notes (Signed)
Patient stating he can take Hydrocodone in pill form, had a bad experience with a cough medication in the past.

## 2014-07-08 NOTE — Progress Notes (Signed)
A/ox3 pleased with MAC, report to Annette RN 

## 2014-07-08 NOTE — Patient Instructions (Addendum)
No polyps today!  You do have a condition called diverticulosis - common and not usually a problem. Please read the handout provided.  Since you have had polyps in the past I recommend a routine repeat colonoscopy in 7 years (2022).  I appreciate the opportunity to care for you. Gatha Mayer, MD, FACG     YOU HAD AN ENDOSCOPIC PROCEDURE TODAY AT [facility]: Refer to the procedure report that was given to you for any specific questions about what was found during the examination.  If the procedure report does not answer your questions, please call your gastroenterologist to clarify.  If you requested that your care partner not be given the details of your procedure findings, then the procedure report has been included in a sealed envelope for you to review at your convenience later.  YOU SHOULD EXPECT: Some feelings of bloating in the abdomen. Passage of more gas than usual.  Walking can help get rid of the air that was put into your GI tract during the procedure and reduce the bloating. If you had a lower endoscopy (such as a colonoscopy or flexible sigmoidoscopy) you may notice spotting of blood in your stool or on the toilet paper. If you underwent a bowel prep for your procedure, then you may not have a normal bowel movement for a few days.  DIET: Your first meal following the procedure should be a light meal and then it is ok to progress to your normal diet.  A half-sandwich or bowl of soup is an example of a good first meal.  Heavy or fried foods are harder to digest and may make you feel nauseous or bloated.  Likewise meals heavy in dairy and vegetables can cause extra gas to form and this can also increase the bloating.  Drink plenty of fluids but you should avoid alcoholic beverages for 24 hours.  ACTIVITY: Your care partner should take you home directly after the procedure.  You should plan to take it easy, moving slowly for the rest of the day.  You can resume normal  activity the day after the procedure however you should NOT DRIVE or use heavy machinery for 24 hours (because of the sedation medicines used during the test).    SYMPTOMS TO REPORT IMMEDIATELY: A gastroenterologist can be reached at any hour.  During normal business hours, 8:30 AM to 5:00 PM Monday through Friday, call [phone].  After hours and on weekends, please call the GI answering service at [phone] who will take a message and have the physician on call contact you.   Following lower endoscopy (colonoscopy or flexible sigmoidoscopy):  Excessive amounts of blood in the stool  Significant tenderness or worsening of abdominal pains  Swelling of the abdomen that is new, acute  Fever of 100F or higher FOLLOW UP: If any biopsies were taken you will be contacted by phone or by letter within the next 1-3 weeks.  Call your gastroenterologist if you have not heard about the biopsies in 3 weeks.  Our staff will call the home number listed on your records the next business day following your procedure to check on you and address any questions or concerns that you may have at that time regarding the information given to you following your procedure. This is a courtesy call and so if there is no answer at the home number and we have not heard from you through the emergency physician on call, we will assume that you  have returned to your regular daily activities without incident.  SIGNATURES/CONFIDENTIALITY: You and/or your care partner have signed paperwork which will be entered into your electronic medical record.  These signatures attest to the fact that that the information above on your After Visit Summary has been reviewed and is understood.  Full responsibility of the confidentiality of this discharge information lies with you and/or your care-partner.    Handout was given to your care partner on diverticulosis. You may resume your current medications today. Please call if any  questions or concerns.

## 2014-07-08 NOTE — Op Note (Signed)
[facility]  [address] [place], [address]   COLONOSCOPY PROCEDURE REPORT  PATIENT: Dave Robles, Dave Robles  MR#: [id number] BIRTHDATE: [birth year] , 19  yrs. old GENDER: Male ENDOSCOPIST: Gatha Mayer, MD, [facility] PROCEDURE DATE:  07/08/2014 PROCEDURE:   Colonoscopy, surveillance First Screening Colonoscopy - Avg.  risk and is 50 yrs.  old or older - No.  Prior Negative Screening - Now for repeat screening. N/A  History of Adenoma - Now for follow-up colonoscopy & has been > or = to 3 yrs.  Yes hx of adenoma.  Has been 3 or more years since last colonoscopy.  Polyps Removed Today? No.  Recommend repeat exam, <10 yrs? Yes.  High risk (family or personal hx). ASA CLASS:   Class III INDICATIONS:Patient's personal history of colon polyps. MEDICATIONS: propofol (Diprivan) 300mg  IV, MAC sedation, administered by CRNA, and These medications were titrated to patient response per physician's verbal order  DESCRIPTION OF PROCEDURE:   After the risks benefits and alternatives of the procedure were thoroughly explained, informed consent was obtained.  A digital rectal exam revealed no abnormalities of the rectum, A digital rectal exam revealed no prostatic nodules, and A digital rectal exam revealed the prostate was not enlarged.   The LB [id number]  endoscope was introduced through the anus and advanced to the cecum, which was identified by both the appendix and ileocecal valve. No adverse events experienced.   The quality of the prep was excellent using Suprep  The instrument was then slowly withdrawn as the colon was fully examined.      COLON FINDINGS: Severe diverticulosis was noted in the sigmoid colon.   The colon mucosa was otherwise normal.  Retroflexed views revealed no abnormalities. The time to cecum=2 minutes 35 seconds. Withdrawal time=11 minutes 35 seconds.  The scope was withdrawn and the procedure completed. COMPLICATIONS: There were no  complications.  ENDOSCOPIC IMPRESSION: 1.   Severe diverticulosis was noted in the sigmoid colon 2.   The colon mucosa was otherwise normal - excellent prep - prior polyps 1998, 2001, 2010  RECOMMENDATIONS: Repeat Colonoscopy in 7 years - 2022   eSigned:  Gatha Mayer, MD, [facility] 07/08/2014 8:37 AM   cc: The Patient

## 2014-07-08 NOTE — Progress Notes (Signed)
0840 per Dr. Carlean Purl pass a rectal tube.  I lubricated the tube and pass into the rectum only a short distnce approximately 1 in and he did pass flatus several times.  I did not pass rectal tube but 2 x only about 1 in in the rectum each time d/t the pt said it was uncomfortable.  Dr. Carlean Purl into see the pt again and he said to let him be d/c.  I asked the pt to go to the restroom and sit on the toilet and see if he can pass more flatus.  Pt's wife is advised.maw

## 2014-07-08 NOTE — Progress Notes (Signed)
Patient ID: Dave Robles, male   DOB: Aug 11, 1946, 68 y.o.   MRN: [id number] Phone discussion Minimal flatus since at home Has mild lower abdominal cramps and feels distended Has had coffee and [employer]  No fever No back pain  Rec:  Try soup and ambulation Will recontact 330 PM approx

## 2014-07-08 NOTE — Telephone Encounter (Signed)
Called pt back, pt states he is still having some abd cramping and has not passed any air since leaving [place], pt states he has taken 2 gas-x- 1 at 10:30 am 1 at 12:30 pm with not relief, pt states he has had another cup of coffee and has walked around house, pt wanted to know if eating would help, advised to wait on eating until he has passed some more air bc it may make him feel sick, advised pt I would speak with Dr Carlean Purl to see if he had any other recommendations and would get back with him, also advised pt to walk around house a couple of times while he is waiting to get back with him-adm  Dr Carlean Purl spoke with pt advised he could have some soup, drink fluids and to continue to walk around house to get the air to move, it will take some time but we will follow-up with him around 3:30 pm today to see if pt is any better.-adm

## 2014-07-08 NOTE — Progress Notes (Signed)
Pt drank warm coffee.  He ambulated around the nurses station with assistance.  He went back to the restroom.  Dr. Carlean Purl in and he offered to take the pt back in the procedure room and try to decompress him more.  Pt declined and decided to go home.  He was advised to take gas-x if needed.  Call if needed.  Pt to be discharged. maw

## 2014-07-08 NOTE — Progress Notes (Signed)
Patient ID: Dave Robles, male   DOB: September 22, 1946, 68 y.o.   MRN: [id number] He has had scrambled eggs Not worse but not better  Plan to continue walking and call back if fever, vomiting, etc  If deteriorates to call back otherwise we will call him as usual in AM - if still unwell will see him in the office. Offered xray today he prefers to see how things go.

## 2014-07-09 ENCOUNTER — Telehealth: Payer: Self-pay | Admitting: *Deleted

## 2014-07-09 NOTE — Telephone Encounter (Signed)
  Follow up Call-  Call back number 07/08/2014  Post procedure Call Back phone  # [phone]  Permission to leave phone message Yes     Patient questions:  Do you have a fever, pain , or abdominal swelling? No. Pain Score  0 *  Have you tolerated food without any problems? Yes.    Have you been able to return to your normal activities? Yes.    Do you have any questions about your discharge instructions: Diet   No. Medications  No. Follow up visit  No.  Do you have questions or concerns about your Care? No.  Actions: * If pain score is 4 or above: No action needed, pain <4.

## 2014-07-13 ENCOUNTER — Encounter: Payer: Self-pay | Admitting: Internal Medicine

## 2014-08-01 ENCOUNTER — Encounter: Payer: Self-pay | Admitting: Family Medicine

## 2014-08-01 ENCOUNTER — Ambulatory Visit (INDEPENDENT_AMBULATORY_CARE_PROVIDER_SITE_OTHER): Payer: Medicare Other | Admitting: Family Medicine

## 2014-08-01 VITALS — BP 160/80 | Temp 98.5°F | Wt 239.0 lb

## 2014-08-01 DIAGNOSIS — Z23 Encounter for immunization: Secondary | ICD-10-CM

## 2014-08-01 DIAGNOSIS — I10 Essential (primary) hypertension: Secondary | ICD-10-CM

## 2014-08-01 DIAGNOSIS — F528 Other sexual dysfunction not due to a substance or known physiological condition: Secondary | ICD-10-CM

## 2014-08-01 DIAGNOSIS — F39 Unspecified mood [affective] disorder: Secondary | ICD-10-CM

## 2014-08-01 HISTORY — DX: Unspecified mood (affective) disorder: F39

## 2014-08-01 MED ORDER — SILDENAFIL CITRATE 100 MG PO TABS: 100.0000 mg | ORAL_TABLET | Freq: Every day | ORAL | Status: DC | PRN

## 2014-08-01 NOTE — Progress Notes (Signed)
   Subjective:    Patient ID: Dave Robles, male    DOB: 1946/07/27, 68 y.o.   MRN: [id number]  HPI Dave Robles is a 68 year old married male nonsmoker who comes in today for evaluation of a couple issues  He states he's been to see his dentist. He needs a lot of dental work and his tinnitus is concern he may have some underlying medical problem or one of his medications may be causing dental deterioration. I reviewed his med list and are non-. He only takes doxycycline when he has gum problems from this. On this.  His blood pressure at home 125/80 on Zestoretic 20-12.5. BP here 160/80.  He states couple years ago we gave him some antidepressant he thinks it was Celexa. He took 4 of them could take it because it didn't seem to help. He now has symptoms of moodiness. He says he gets angry for minor reasons. An example which he broke his right at the other day plan tenderness over some incidental issue.  He is also worried about his wife. She seems to be moody and grouchy all the time. He wonders if she has early dementia   Review of Systems Review of systems otherwise negative    Objective:   Physical Exam Well-developed well-nourished male no acute distress vital signs stable is afebrile home BP 125/80      Assessment & Plan:  Hypertension at goal,,,,,,,, continue current therapy  Mood changes,,,,,,,,,, consult with behavioral health,

## 2014-08-01 NOTE — Patient Instructions (Signed)
Consult with behavioral health to look into the issues we discussed

## 2014-08-01 NOTE — Progress Notes (Signed)
Pre visit review using our clinic review tool, if applicable. No additional management support is needed unless otherwise documented below in the visit note. 

## 2014-09-09 ENCOUNTER — Encounter: Payer: Self-pay | Admitting: Nurse Practitioner

## 2014-09-09 ENCOUNTER — Ambulatory Visit (INDEPENDENT_AMBULATORY_CARE_PROVIDER_SITE_OTHER): Payer: Medicare Other | Admitting: Nurse Practitioner

## 2014-09-09 VITALS — BP 142/78 | HR 76 | Ht 70.0 in | Wt 235.0 lb

## 2014-09-09 DIAGNOSIS — E785 Hyperlipidemia, unspecified: Secondary | ICD-10-CM

## 2014-09-09 DIAGNOSIS — I10 Essential (primary) hypertension: Secondary | ICD-10-CM

## 2014-09-09 DIAGNOSIS — I25810 Atherosclerosis of coronary artery bypass graft(s) without angina pectoris: Secondary | ICD-10-CM

## 2014-09-09 NOTE — Patient Instructions (Signed)
Stay on your current medicines  See [place] in one year  Call the [place] office at [phone] if you have any questions, problems or concerns.

## 2014-09-09 NOTE — Progress Notes (Signed)
[name] Date of Birth: [birth year] Medical Record #[id number]  History of Present Illness: Dave Robles is seen back today for a follow up visit. He is seen for Dr. Aundra Dubin. He has HTN, HLD, CAD with past CABG in 1991, negative Myoview in 2011 with EF 67% and no ischemia, ED, tobacco abuse, asthma and depression.   Last seen in April of 2014 - he had had a brief syncopal spell six weeks prior to that visit - Myoview was updated - this looked good.  Comes back today. Here alone. He has been doing well. No chest pain. Not short of breath. Not dizzy or lightheaded. Labs checked by PCP. Exercising daily. Weight down a few pounds. Tolerating his medicines without issue.   Current Outpatient Prescriptions  Medication Sig Dispense Refill  . aspirin 81 MG tablet Take 81 mg by mouth daily.        Marland Kitchen atorvastatin (LIPITOR) 80 MG tablet TAKE 1 TABLET (80 MG TOTAL) BY MOUTH DAILY.  90 tablet  3  . cyclobenzaprine (FLEXERIL) 10 MG tablet TAKE ONE TABLET BY MOUTH NIGHTLY AT BEDTIME AS NEEDED FOR MUSCLE SPASMS  90 tablet  3  . doxycycline (VIBRA-TABS) 100 MG tablet       . HYDROcodone-acetaminophen (NORCO/VICODIN) 5-325 MG per tablet       . lisinopril-hydrochlorothiazide (PRINZIDE,ZESTORETIC) 20-12.5 MG per tablet Take 1 tablet by mouth daily.  100 tablet  3  . Multiple Vitamin (MULTIVITAMIN) capsule Take 1 capsule by mouth daily.        . nitroGLYCERIN (NITROSTAT) 0.4 MG SL tablet Place 1 tablet (0.4 mg total) under the tongue every 5 (five) minutes as needed.  25 tablet  1  . sildenafil (VIAGRA) 100 MG tablet Take 1 tablet (100 mg total) by mouth daily as needed.  10 tablet  11   No current facility-administered medications for this visit.    Allergies  Allergen Reactions  . Hydrocodone-Homatropine     REACTION: hives    Past Medical History  Diagnosis Date  . HYPERLIPIDEMIA 07/10/2007  . ERECTILE DYSFUNCTION 07/10/2007  . TOBACCO USE 12/15/2009  . Adjustment disorder with depressed mood  03/04/2009  . HYPERTENSION 07/10/2007  . CORONARY ARTERY DISEASE 07/10/2007  . Pneumonia due to Mycoplasma pneumoniae 09/10/2008  . Extrinsic asthma, unspecified 09/10/2008    Past Surgical History  Procedure Laterality Date  . Coronary artery bypass graft  11/15/89    5 vessels  . Inguinal hernia repair Left 1985  . Hydrocele excision / repair  2011  . Colonoscopy      History  Smoking status  . Former Smoker -- 0.50 packs/day  . Quit date: 01/13/2010  Smokeless tobacco  . Never Used    History  Alcohol Use  . 3.0 oz/week  . 5 Cans of beer per week    Family History  Problem Relation Age of Onset  . Coronary artery disease Father     cabg  . Hyperlipidemia Other   . Hypertension Other   . Colon cancer Neg Hx     Review of Systems: The review of systems is per the HPI.  All other systems were reviewed and are negative.  Physical Exam: BP 142/78  Pulse 76  Ht 5\' 10"  (1.778 m)  Wt 235 lb (106.595 kg)  BMI 33.72 kg/m2 BP is 122/80 by me.  Patient is very pleasant and in no acute distress. Weight is down a few pounds. Skin is warm and dry. Color is normal.  HEENT  is unremarkable. Normocephalic/atraumatic. PERRL. Sclera are nonicteric. Neck is supple. No masses. No JVD. Lungs are clear. Cardiac exam shows a regular rate and rhythm. Abdomen is soft. Extremities are without edema. Gait and ROM are intact. No gross neurologic deficits noted.  Wt Readings from Last 3 Encounters:  09/09/14 235 lb (106.595 kg)  08/01/14 239 lb (108.41 kg)  07/08/14 238 lb (107.956 kg)    LABORATORY DATA/PROCEDURES:  Lab Results  Component Value Date   WBC 7.5 12/27/2013   HGB 16.7 12/27/2013   HCT 50.6 12/27/2013   PLT 167.0 12/27/2013   GLUCOSE 81 12/27/2013   CHOL 128 12/27/2013   TRIG 82.0 12/27/2013   HDL 30.80* 12/27/2013   LDLDIRECT 66.7 11/30/2011   LDLCALC 81 12/27/2013   ALT 25 12/27/2013   AST 26 12/27/2013   NA 140 12/27/2013   K 3.9 12/27/2013   CL 104 12/27/2013   CREATININE  1.2 12/27/2013   BUN 18 12/27/2013   CO2 29 12/27/2013   TSH 1.76 12/27/2013   PSA 1.02 12/27/2013    BNP (last 3 results) No results found for this basename: PROBNP,  in the last 8760 hours  Myoview Impression from April of 2014 Exercise Capacity: Good exercise capacity.  BP Response: Normal blood pressure response.  Clinical Symptoms: No significant symptoms noted.  ECG Impression: No significant ST segment change suggestive of ischemia.  Comparison with Prior Nuclear Study: No significant change from previous study 12/24/09.  Overall Impression: Normal stress nuclear study. No evidence of ischemia.  LV Ejection Fraction: 70%. LV Wall Motion: NL LV Function; NL Wall Motion.  Thayer Headings, Brooke Bonito., MD, [facility]  03/06/2013, 6:12 PM  Office - [phone]  Pager [phone]  Assessment / Plan: 1. CAD - no active symptoms  2. HLD - on statin - labs checked by PCP  3. Syncope - no recurrence  4. HTN - BP recheck is good - he brought in his readings from home to review as well - these are great. No change in current therapy.   See back in one year.   Patient is agreeable to this plan and will call if any problems develop in the interim.   [name], RN, [facility] [address] [place], [place]  [phone]

## 2014-11-15 DIAGNOSIS — I219 Acute myocardial infarction, unspecified: Secondary | ICD-10-CM

## 2014-11-15 HISTORY — DX: Acute myocardial infarction, unspecified: I21.9

## 2014-11-19 ENCOUNTER — Ambulatory Visit (INDEPENDENT_AMBULATORY_CARE_PROVIDER_SITE_OTHER): Payer: Medicare Other | Admitting: Family Medicine

## 2014-11-19 ENCOUNTER — Encounter: Payer: Self-pay | Admitting: Family Medicine

## 2014-11-19 VITALS — BP 120/78 | Temp 98.5°F | Wt 238.0 lb

## 2014-11-19 DIAGNOSIS — I10 Essential (primary) hypertension: Secondary | ICD-10-CM

## 2014-11-19 NOTE — Progress Notes (Signed)
Pre visit review using our clinic review tool, if applicable. No additional management support is needed unless otherwise documented below in the visit note. 

## 2014-11-19 NOTE — Patient Instructions (Signed)
This is a normal physiologic response to exercise. His long as your blood pressure and heart rate go back to normal at rest and you know your medication is adequate and it's not any underlying problem. However if you do develop decreased exercise tolerance or angina call us immediately

## 2014-11-19 NOTE — Progress Notes (Signed)
   Subjective:    Patient ID: Dave Robles, male    DOB: 24-Jul-1946, 69 y.o.   MRN: [id number]  HPI Dave Robles is a 69 year old male who comes in today for discuss hypertension  He's on Zestoretic 20-12 0.5 daily for mild hypertension and BPH here today is 120/78. His morning blood pressures at home are all normal too. His concern is that when he exercises his heart rate and blood pressure go up. Family rest his heart rate and blood pressure come back to normal. I explained to him this is a normal physiologic event. He's not having a change or decrease in his exercise tolerance nor angina. Reassured that if he does have an issue he would notice a change in his exercise tolerance or develop chest pain. However the increase in blood pressure heart rate-related exercise is a normal physiologic event.   Review of Systems Negative    Objective:   Physical Exam  Well-developed well-nourished male no acute distress vital signs stable he is afebrile cardiopulmonary exam normal BP today 120/78      Assessment & Plan:  Normal physiologic response to exercise....... continue current medications follow-up in March as outlined

## 2014-11-27 ENCOUNTER — Ambulatory Visit (INDEPENDENT_AMBULATORY_CARE_PROVIDER_SITE_OTHER): Payer: Medicare Other | Admitting: Family Medicine

## 2014-11-27 ENCOUNTER — Encounter: Payer: Self-pay | Admitting: Family Medicine

## 2014-11-27 VITALS — BP 110/70 | Temp 98.9°F | Wt 238.0 lb

## 2014-11-27 DIAGNOSIS — B9789 Other viral agents as the cause of diseases classified elsewhere: Principal | ICD-10-CM

## 2014-11-27 DIAGNOSIS — J069 Acute upper respiratory infection, unspecified: Secondary | ICD-10-CM

## 2014-11-27 MED ORDER — HYDROCODONE-HOMATROPINE 5-1.5 MG/5ML PO SYRP: 5.0000 mL | ORAL_SOLUTION | Freq: Three times a day (TID) | ORAL | Status: DC | PRN

## 2014-11-27 NOTE — Patient Instructions (Signed)
Drink lots of water  Vaporizer  Hydromet.......Marland Kitchen 1/2-1 teaspoon at bedtime  Return when necessary

## 2014-11-27 NOTE — Progress Notes (Signed)
Pre visit review using our clinic review tool, if applicable. No additional management support is needed unless otherwise documented below in the visit note. 

## 2014-11-27 NOTE — Progress Notes (Signed)
   Subjective:    Patient ID: Dave Robles, male    DOB: 07/18/1946, 69 y.o.   MRN: [id number]  HPI Dave Robles is a 69 year old married male nonsmoker who comes in with a four-day history of a congestion runny nose and cough. No fever   Review of Systems Review of systems negative    Objective:   Physical Exam  Well-developed well-nourished male no acute distress vital signs stable he is afebrile HEENT were negative neck was supple no adenopathy lungs are clear      Assessment & Plan:  Viral syndrome plan treat symptomatically........... antibiotics not indicated

## 2014-11-28 ENCOUNTER — Ambulatory Visit: Payer: Medicare Other | Admitting: Family Medicine

## 2014-12-16 HISTORY — PX: CORONARY ANGIOPLASTY: SHX604

## 2015-01-06 ENCOUNTER — Encounter: Payer: Self-pay | Admitting: Nurse Practitioner

## 2015-01-06 NOTE — Telephone Encounter (Signed)
Pt aware that he should come in for a follow-up appt with [name] NP on 02/07/15 at 0800 and have his recent cardiac history, procedures, and test sent to our office by fax or mailed.  Informed the pt of address and fax number.  Pt verbalized understanding and agrees with this plan.

## 2015-01-15 ENCOUNTER — Encounter: Payer: Self-pay | Admitting: Family Medicine

## 2015-01-16 ENCOUNTER — Telehealth: Payer: Self-pay | Admitting: Internal Medicine

## 2015-01-16 NOTE — Telephone Encounter (Signed)
Received medical records, 16 pages, from [facility]. Sent to Dr. Carlean Purl  01/16/15/ss.

## 2015-01-21 ENCOUNTER — Other Ambulatory Visit (INDEPENDENT_AMBULATORY_CARE_PROVIDER_SITE_OTHER): Payer: Medicare Other

## 2015-01-21 DIAGNOSIS — Z0000 Encounter for general adult medical examination without abnormal findings: Secondary | ICD-10-CM

## 2015-01-21 DIAGNOSIS — E785 Hyperlipidemia, unspecified: Secondary | ICD-10-CM

## 2015-01-21 LAB — CBC WITH DIFFERENTIAL/PLATELET
BASOS PCT: 0.9 % (ref 0.0–3.0)
Basophils Absolute: 0.1 10*3/uL (ref 0.0–0.1)
EOS ABS: 0.3 10*3/uL (ref 0.0–0.7)
EOS PCT: 4.3 % (ref 0.0–5.0)
HCT: 45.3 % (ref 39.0–52.0)
Hemoglobin: 15.3 g/dL (ref 13.0–17.0)
LYMPHS PCT: 29.9 % (ref 12.0–46.0)
Lymphs Abs: 2.3 10*3/uL (ref 0.7–4.0)
MCHC: 33.8 g/dL (ref 30.0–36.0)
MCV: 93.3 fl (ref 78.0–100.0)
Monocytes Absolute: 0.7 10*3/uL (ref 0.1–1.0)
Monocytes Relative: 9.5 % (ref 3.0–12.0)
NEUTROS PCT: 55.4 % (ref 43.0–77.0)
Neutro Abs: 4.3 10*3/uL (ref 1.4–7.7)
Platelets: 179 10*3/uL (ref 150.0–400.0)
RBC: 4.86 Mil/uL (ref 4.22–5.81)
RDW: 14 % (ref 11.5–15.5)
WBC: 7.8 10*3/uL (ref 4.0–10.5)

## 2015-01-21 LAB — HEPATIC FUNCTION PANEL
ALK PHOS: 77 U/L (ref 39–117)
ALT: 25 U/L (ref 0–53)
AST: 17 U/L (ref 0–37)
Albumin: 3.9 g/dL (ref 3.5–5.2)
Bilirubin, Direct: 0.1 mg/dL (ref 0.0–0.3)
TOTAL PROTEIN: 6.3 g/dL (ref 6.0–8.3)
Total Bilirubin: 0.5 mg/dL (ref 0.2–1.2)

## 2015-01-21 LAB — LIPID PANEL
Cholesterol: 123 mg/dL (ref 0–200)
HDL: 31.6 mg/dL — ABNORMAL LOW (ref >39.00)
LDL Cholesterol: 61 mg/dL (ref 0–99)
NonHDL: 91.4
Total CHOL/HDL Ratio: 4
Triglycerides: 150 mg/dL — ABNORMAL HIGH (ref 0.0–149.0)
VLDL: 30 mg/dL (ref 0.0–40.0)

## 2015-01-21 LAB — POCT URINALYSIS DIPSTICK
Bilirubin, UA: NEGATIVE
Blood, UA: NEGATIVE
GLUCOSE UA: NEGATIVE
KETONES UA: NEGATIVE
LEUKOCYTES UA: NEGATIVE
Nitrite, UA: NEGATIVE
Spec Grav, UA: 1.02
UROBILINOGEN UA: 0.2
pH, UA: 6

## 2015-01-21 LAB — BASIC METABOLIC PANEL
BUN: 19 mg/dL (ref 6–23)
CHLORIDE: 106 meq/L (ref 96–112)
CO2: 31 meq/L (ref 19–32)
Calcium: 9.2 mg/dL (ref 8.4–10.5)
Creatinine, Ser: 1.24 mg/dL (ref 0.40–1.50)
GFR: 61.69 mL/min (ref >60.00)
GLUCOSE: 94 mg/dL (ref 70–99)
POTASSIUM: 4.5 meq/L (ref 3.5–5.1)
Sodium: 142 mEq/L (ref 135–145)

## 2015-01-21 LAB — PSA: PSA: 1.02 ng/mL (ref 0.10–4.00)

## 2015-01-21 LAB — TSH: TSH: 2.1 u[IU]/mL (ref 0.35–4.50)

## 2015-01-27 ENCOUNTER — Encounter: Payer: Self-pay | Admitting: Family Medicine

## 2015-01-27 ENCOUNTER — Ambulatory Visit (INDEPENDENT_AMBULATORY_CARE_PROVIDER_SITE_OTHER): Payer: Medicare Other | Admitting: Family Medicine

## 2015-01-27 VITALS — BP 120/80 | Temp 98.8°F | Ht 70.0 in | Wt 242.0 lb

## 2015-01-27 DIAGNOSIS — I10 Essential (primary) hypertension: Secondary | ICD-10-CM

## 2015-01-27 DIAGNOSIS — I2510 Atherosclerotic heart disease of native coronary artery without angina pectoris: Secondary | ICD-10-CM

## 2015-01-27 DIAGNOSIS — E785 Hyperlipidemia, unspecified: Secondary | ICD-10-CM

## 2015-01-27 DIAGNOSIS — F528 Other sexual dysfunction not due to a substance or known physiological condition: Secondary | ICD-10-CM

## 2015-01-27 DIAGNOSIS — Z23 Encounter for immunization: Secondary | ICD-10-CM

## 2015-01-27 MED ORDER — CYCLOBENZAPRINE HCL 10 MG PO TABS: ORAL_TABLET | ORAL | Status: DC

## 2015-01-27 MED ORDER — LISINOPRIL-HYDROCHLOROTHIAZIDE 20-12.5 MG PO TABS: 1.0000 | ORAL_TABLET | Freq: Every day | ORAL | Status: DC

## 2015-01-27 MED ORDER — ATORVASTATIN CALCIUM 80 MG PO TABS: ORAL_TABLET | ORAL | Status: DC

## 2015-01-27 NOTE — Progress Notes (Signed)
Pre visit review using our clinic review tool, if applicable. No additional management support is needed unless otherwise documented below in the visit note. 

## 2015-01-27 NOTE — Progress Notes (Signed)
   Subjective:    Patient ID: Dave Robles, male    DOB: 1946-08-24, 69 y.o.   MRN: [id number]  HPI Dave Robles is a 69 year old married male ex-smoker who comes in today for follow-up  He was in [place] doing well. The morning of February 19 he went for 15 mile bike ride and felt fine. Later on the day he had some fullness in his chest. No true angina. He went to the hospital his troponin was elevated 10.6% and he was taken to the Cath Lab. Catheterization was done on the 20th the day after admission. The 99% lesion at the origin of his right coronary artery was treated with angioplasty and a stent. He also had a distal lesion in that right coronary there was treated with angioplasty. He suffered felt well and discharged for follow-up. Beta blocker 25 mg twice a day and Plavix were added to his medical regime.  He says he feels well except for fatigue mood changes and tired. Explained these are the side effects from the beta blocker. No extensive bruising on the Plavix and the aspirin   Review of Systems Review of systems otherwise negative    Objective:   Physical Exam  Well-developed well-nourished male no acute distress vital signs stable he is afebrile resting pulse 60 BP 120/80      Assessment & Plan:  Coronary disease......Marland Kitchen recent stent origin of RCA and angioplasty distal RCA lesion February 2016 in [place]....... continue current medications follow-up with Dr. Pollie Robles ASAP

## 2015-01-27 NOTE — Patient Instructions (Signed)
Decrease the beta blocker to 12.5 mg twice daily  Continue other medications  I have a call into Dr. Pollie Meyer

## 2015-01-28 ENCOUNTER — Encounter: Payer: Self-pay | Admitting: Nurse Practitioner

## 2015-01-28 ENCOUNTER — Ambulatory Visit (INDEPENDENT_AMBULATORY_CARE_PROVIDER_SITE_OTHER): Payer: Medicare Other | Admitting: Nurse Practitioner

## 2015-01-28 VITALS — BP 150/74 | HR 104 | Ht 70.0 in | Wt 237.8 lb

## 2015-01-28 DIAGNOSIS — E785 Hyperlipidemia, unspecified: Secondary | ICD-10-CM

## 2015-01-28 DIAGNOSIS — I25810 Atherosclerosis of coronary artery bypass graft(s) without angina pectoris: Secondary | ICD-10-CM

## 2015-01-28 DIAGNOSIS — I10 Essential (primary) hypertension: Secondary | ICD-10-CM

## 2015-01-28 DIAGNOSIS — Z955 Presence of coronary angioplasty implant and graft: Secondary | ICD-10-CM

## 2015-01-28 NOTE — Progress Notes (Addendum)
CARDIOLOGY OFFICE NOTE  Date:  01/28/2015    Dave Robles Date of Birth: [birth year] Medical Record #[id number]  PCP:  Joycelyn Man, MD  Cardiologist:  Aundra Dubin    Chief Complaint  Patient presents with  . Post MI    Seen for Dr. Aundra Dubin     History of Present Illness: Dave Robles is a 69 y.o. male who presents today for a post hospital visit. He is seen for Dr. Aundra Dubin. He has HTN, HLD, CAD with past CABG in 1991, negative Myoview in 2011 with EF 67% and no ischemia, ED, tobacco abuse, asthma and depression.   Seen in April of 2014 - he had had a brief syncopal spell six weeks prior to that visit - Myoview was updated - this looked good.  I last saw him in October of 2015. He was doing well.   Comes back today. Here alone. He was out in [place] for a long vacation last month. Had been exercising pretty hard - riding his bike about 15 miles per day. One day he just did not feel well. No real chest pain but was concerned and went to the ER. Told them he had CAD. EKG was ok. Troponin was +. He was cathed - noted to have NL left main, 90% prox LAD, 99% mid LAD with patent LIMA to mid LAD and patent SVG to DX. LCX origin stenosis vs flow void? Fistula vs competitive flow in branch?, 100% RCA, SVG to RCA 99% origin treated with Xience DES and PTCA to the distal. He is on Plavix. He was told that "there was something squirting" and that he possibly needs a CT scan????  Placed on Metoprolol. No one restricted his activites - he is already back riding his bike about 12 miles a day. He feels fine. No chest pain - but he never had. Saw Dr. Sherren Mocha yesterday who cut his metoprolol in half due to low HR. BP typically ok. He is planning on some dental work - dentist is NOT aware about being on Plavix.   He has brought in a cath CD for review.    Past Medical History  Diagnosis Date  . HYPERLIPIDEMIA 07/10/2007  . ERECTILE DYSFUNCTION 07/10/2007  . TOBACCO USE 12/15/2009  . Adjustment  disorder with depressed mood 03/04/2009  . HYPERTENSION 07/10/2007  . CORONARY ARTERY DISEASE 07/10/2007  . Pneumonia due to Mycoplasma pneumoniae 09/10/2008  . Extrinsic asthma, unspecified 09/10/2008    Past Surgical History  Procedure Laterality Date  . Coronary artery bypass graft  11/15/89    5 vessels  . Inguinal hernia repair Left 1985  . Hydrocele excision / repair  2011  . Colonoscopy       Medications: Current Outpatient Prescriptions  Medication Sig Dispense Refill  . amoxicillin (AMOXIL) 500 MG capsule Take 500 mg by mouth 2 (two) times daily. Prior to dental procedure  0  . aspirin 81 MG tablet Take 81 mg by mouth daily.      Marland Kitchen atorvastatin (LIPITOR) 80 MG tablet TAKE 1 TABLET (80 MG TOTAL) BY MOUTH DAILY. 90 tablet 3  . clopidogrel (PLAVIX) 75 MG tablet Take 75 mg by mouth daily.  3  . cyclobenzaprine (FLEXERIL) 10 MG tablet TAKE ONE TABLET BY MOUTH NIGHTLY AT BEDTIME AS NEEDED FOR MUSCLE SPASMS 90 tablet 3  . HYDROcodone-acetaminophen (NORCO/VICODIN) 5-325 MG per tablet Take 1 tablet by mouth every 4 (four) hours as needed (for dental procedure/ pt unsure of tablets).     Marland Kitchen  lisinopril-hydrochlorothiazide (PRINZIDE,ZESTORETIC) 20-12.5 MG per tablet Take 1 tablet by mouth daily. 100 tablet 3  . metoprolol tartrate (LOPRESSOR) 25 MG tablet Take 12.5 mg by mouth 2 (two) times daily.   3  . Multiple Vitamin (MULTIVITAMIN) capsule Take 1 capsule by mouth daily.      . nitroGLYCERIN (NITROSTAT) 0.4 MG SL tablet Place 1 tablet (0.4 mg total) under the tongue every 5 (five) minutes as needed. 25 tablet 1  . sildenafil (VIAGRA) 100 MG tablet Take 1 tablet (100 mg total) by mouth daily as needed. 10 tablet 11   No current facility-administered medications for this visit.    Allergies: Allergies  Allergen Reactions  . Other Other (See Comments)    Histinex - causes tingling    Social History: The patient  reports that he quit smoking about 5 years ago. He has never used  smokeless tobacco. He reports that he drinks about 3.0 oz of alcohol per week. He reports that he does not use illicit drugs.   Family History: The patient's family history includes Coronary artery disease in his father; Hyperlipidemia in his other; Hypertension in his other. There is no history of Colon cancer.   Review of Systems: Please see the history of present illness.   Otherwise, the review of systems is positive for constipation.   All other systems are reviewed and negative.   Physical Exam: VS:  BP 150/74 mmHg  Pulse 104  Ht 5\' 10"  (1.778 m)  Wt 237 lb 12.8 oz (107.865 kg)  BMI 34.12 kg/m2 .  BMI Body mass index is 34.12 kg/(m^2).  Repeat BP by me is 130/70.   Wt Readings from Last 3 Encounters:  01/28/15 237 lb 12.8 oz (107.865 kg)  01/27/15 242 lb (109.77 kg)  11/27/14 238 lb (107.956 kg)    General: Pleasant. Well developed, well nourished and in no acute distress.  HEENT: Normal. Neck: Supple, no JVD, carotid bruits, or masses noted.  Cardiac: Regular rate and rhythm. He is tachycardic. No murmurs, rubs, or gallops. No edema.  Respiratory:  Lungs are clear to auscultation bilaterally with normal work of breathing.  GI: Soft and nontender.  MS: No deformity or atrophy. Gait and ROM intact. Skin: Warm and dry. Color is normal.  Neuro:  Strength and sensation are intact and no gross focal deficits noted.  Psych: Alert, appropriate and with normal affect.   LABORATORY DATA:  EKG:  EKG is ordered today. This demonstrates sinus tach, inferior Q's, diffuse T wave changes inferiorly and in V3 to V6.    Lab Results  Component Value Date   WBC 7.8 01/21/2015   HGB 15.3 01/21/2015   HCT 45.3 01/21/2015   PLT 179.0 01/21/2015   GLUCOSE 94 01/21/2015   CHOL 123 01/21/2015   TRIG 150.0* 01/21/2015   HDL 31.60* 01/21/2015   LDLDIRECT 66.7 11/30/2011   LDLCALC 61 01/21/2015   ALT 25 01/21/2015   AST 17 01/21/2015   NA 142 01/21/2015   K 4.5 01/21/2015   CL 106  01/21/2015   CREATININE 1.24 01/21/2015   BUN 19 01/21/2015   CO2 31 01/21/2015   TSH 2.10 01/21/2015   PSA 1.02 01/21/2015    BNP (last 3 results) No results for input(s): BNP in the last 8760 hours.  ProBNP (last 3 results) No results for input(s): PROBNP in the last 8760 hours.   Other Studies Reviewed Today:  Myoview Impression from April of 2014 Exercise Capacity: Good exercise capacity.  BP Response: Normal  blood pressure response.  Clinical Symptoms: No significant symptoms noted.  ECG Impression: No significant ST segment change suggestive of ischemia.  Comparison with Prior Nuclear Study: No significant change from previous study 12/24/09.  Overall Impression: Normal stress nuclear study. No evidence of ischemia.  LV Ejection Fraction: 70%. LV Wall Motion: NL LV Function; NL Wall Motion.  Thayer Headings, Brooke Bonito., MD, [facility]  03/06/2013, 6:12 PM  Office - [phone]  Pager [phone]  Assessment / Plan: 1. CAD/recent NSTEMI - with PCI to SVG to RCA - not clear about this LCX abnormality. EF 45%. I will need to get his cath CD reviewed. He is pretty much back to all of his regular activities including his exercise - I cautioned him about that - HR is higher today. Needs to go back on full dose of Metoprolol. I will see again in a month. He is to let the dentist know about the Plavix.   2. Mild LV dysfunction - increase beta blocker. On ACE. Try to titrate meds and plan to get echo in 3 months.   3. HLD - on statin   4. HTN - BP recheck by me is good - No change in current therapy.   Current medicines are reviewed with the patient today.  The patient does not have concerns regarding medicines other than what has been noted above.  The following changes have been made:  See above.  Labs/ tests ordered today include:    Orders Placed This Encounter  Procedures  . Basic metabolic panel  . CBC  . EKG 12-Lead     Disposition:   FU with me in 4 months  Patient  is agreeable to this plan and will call if any problems develop in the interim.   Signed: [name], RN, ANP-C 01/28/2015 4:43 PM  [facility] [address] [place], [place]  [address] Phone: [phone] Fax: [phone]        Addendum: Cath CD reviewed with Dr. 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 Would follow.  Patient will be advised.   [name], RN, [facility] [address] [place], [place]  [address] [phone]

## 2015-01-28 NOTE — Patient Instructions (Signed)
We will be checking the following labs today BMET and CBC  See me in one month  I will share your cath CD with Dr. Burt Knack tomorrow and I will be in touch with you.  Gradually get back to your normal routine of activities  Increase the Metoprolol back to a whole tablet twice a day  Monitor your BP and HR for me - do at various time and keep a diary  Call the [place] office at [phone] if you have any questions, problems or concerns.

## 2015-01-29 ENCOUNTER — Telehealth: Payer: Self-pay | Admitting: Nurse Practitioner

## 2015-01-29 LAB — CBC
HCT: 49.1 % (ref 39.0–52.0)
Hemoglobin: 17 g/dL (ref 13.0–17.0)
MCHC: 34.7 g/dL (ref 30.0–36.0)
MCV: 92.4 fl (ref 78.0–100.0)
Platelets: 185 10*3/uL (ref 150.0–400.0)
RBC: 5.31 Mil/uL (ref 4.22–5.81)
RDW: 13.9 % (ref 11.5–15.5)
WBC: 13.8 10*3/uL — ABNORMAL HIGH (ref 4.0–10.5)

## 2015-01-29 LAB — BASIC METABOLIC PANEL
BUN: 22 mg/dL (ref 6–23)
CO2: 26 mEq/L (ref 19–32)
Calcium: 10 mg/dL (ref 8.4–10.5)
Chloride: 105 mEq/L (ref 96–112)
Creatinine, Ser: 1.43 mg/dL (ref 0.40–1.50)
GFR: 52.32 mL/min — ABNORMAL LOW (ref >60.00)
Glucose, Bld: 119 mg/dL — ABNORMAL HIGH (ref 70–99)
Potassium: 4.2 mEq/L (ref 3.5–5.1)
Sodium: 141 mEq/L (ref 135–145)

## 2015-01-29 NOTE — Telephone Encounter (Signed)
Request for surgical clearance:  1. What type of surgery is being performed? Surgical extraction and bone graph on tooth #2   2. When is this surgery scheduled? Not yet   3. Are there any medications that need to be held prior to surgery and how long?  4. Name of physician performing surgery? Dr. Docia Barrier   What is your office phone and fax number? Phone: [phone] Fax # [phone]  Comment: 2 questions were sent on the fax..The first was answered and that was for the pt to stay on plavix... The second question was not answered and they would like to know if the the patient can have an elective dental treatment done now or will they have to wait 6 months post heart attack. Please call back.

## 2015-01-31 NOTE — Telephone Encounter (Signed)
Faxed over the updated response to questions

## 2015-02-05 ENCOUNTER — Telehealth: Payer: Self-pay | Admitting: Nurse Practitioner

## 2015-02-05 NOTE — Telephone Encounter (Signed)
Unable to locate updated clearance. I reviewed with Dave Merle, NP and pt cannot stop Plavix at this time and should wait one year for elective dental procedure.  I spoke with Dave Robles and gave her this information.  She does not know if procedure is emergent.  She states pt may have cracked tooth but does not know if it needs to be removed.  She reports she did not receive updated clearance form.   She will contact our office if additional questions.

## 2015-02-05 NOTE — Telephone Encounter (Signed)
   Lisa from Dr. Laurice Record office calling to clarify surgical clearance for pt. Wanted to speak w/ Cecille Rubin, notified that she is in clinic. Per lisa- Can elective dental treatment be done now for pt or need to wait certain time for new surgery. Please call back to clarify.

## 2015-02-07 ENCOUNTER — Ambulatory Visit: Payer: Medicare Other | Admitting: Nurse Practitioner

## 2015-02-25 ENCOUNTER — Ambulatory Visit: Payer: Medicare Other | Admitting: Interventional Cardiology

## 2015-03-05 ENCOUNTER — Ambulatory Visit: Payer: Medicare Other | Admitting: Nurse Practitioner

## 2015-03-10 NOTE — Progress Notes (Signed)
CARDIOLOGY OFFICE NOTE  Date:  03/11/2015    Dave Robles Date of Birth: 16-Sep-1946 Medical Record #[id number]  PCP:  Joycelyn Man, MD  Cardiologist:  Aundra Dubin   Chief Complaint  Patient presents with  . Coronary Artery Disease    Follow up visit - seen for Dr. Aundra Dubin     History of Present Illness: Dave Robles is a 69 y.o. male who presents today for a 6 week check. Seen for Dr. Aundra Dubin. He has HTN, HLD, CAD with past CABG in 1991, negative Myoview in 2011 with EF 67% and no ischemia, ED, tobacco abuse, asthma and depression.   Seen in April of 2014 - he had had a brief syncopal spell six weeks prior to that visit - Myoview was updated - this looked good.  I last saw him in October of 2015. He was doing well.   Seen back in March of 2016 after being out in [place] for a long vacation the month prior.  Had been exercising pretty hard - riding his bike about 15 miles per day. One day he just did not feel well. No real chest pain but was concerned and went to the ER. Told them he had CAD. EKG was ok. Troponin was +. He was cathed - noted to have NL left main, 90% prox LAD, 99% mid LAD with patent LIMA to mid LAD and patent SVG to DX. LCX origin stenosis vs flow void? Fistula vs competitive flow in branch?, 100% RCA, SVG to RCA 99% origin treated with Xience DES and PTCA to the distal. He is on Plavix. He was told that "there was something squirting" and that he possibly needs a CT scan???? Placed on Metoprolol. No one restricted his activites - he is already back riding his bike about 12 miles a day. He feels fine. No chest pain - but he never had.   His Cath CD was reviewed with Dr. 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 Burt Knack did NOT feel any treatment is necessary - this is oxygenated blood going to oxygenated blood.  I increased his beta blocker and advised that we need to up titrate his medicines due to his  LV dysfunction. His EF was 45 to 50%.   Comes back today. Here alone. He is doing well. Back exercising but not to the extent that he was previously. Wants to get back to his long bike rides. No chest pain. Not short of breath. Getting ready to embark on a weight loss plan. Says he has done this before and BP has gotten low and had to have meds cut back. Not dizzy or lightheaded at this time. BP diary is great. Tolerating his medicines.   Past Medical History  Diagnosis Date  . HYPERLIPIDEMIA 07/10/2007  . ERECTILE DYSFUNCTION 07/10/2007  . TOBACCO USE 12/15/2009  . Adjustment disorder with depressed mood 03/04/2009  . HYPERTENSION 07/10/2007  . CORONARY ARTERY DISEASE 07/10/2007  . Pneumonia due to Mycoplasma pneumoniae 09/10/2008  . Extrinsic asthma, unspecified 09/10/2008    Past Surgical History  Procedure Laterality Date  . Coronary artery bypass graft  11/15/89    5 vessels  . Inguinal hernia repair Left 1985  . Hydrocele excision / repair  2011  . Colonoscopy       Medications: Current Outpatient Prescriptions  Medication Sig Dispense Refill  . amoxicillin (AMOXIL) 500 MG capsule Take 500 mg by mouth  2 (two) times daily. Prior to dental procedure  0  . aspirin 81 MG tablet Take 81 mg by mouth daily.      Marland Kitchen atorvastatin (LIPITOR) 80 MG tablet TAKE 1 TABLET (80 MG TOTAL) BY MOUTH DAILY. 90 tablet 3  . clopidogrel (PLAVIX) 75 MG tablet Take 1 tablet (75 mg total) by mouth daily. 90 tablet 3  . cyclobenzaprine (FLEXERIL) 10 MG tablet TAKE ONE TABLET BY MOUTH NIGHTLY AT BEDTIME AS NEEDED FOR MUSCLE SPASMS 90 tablet 3  . doxycycline (VIBRAMYCIN) 100 MG capsule Take 100 mg by mouth daily. Take till  Medication is gone  0  . HYDROcodone-acetaminophen (NORCO/VICODIN) 5-325 MG per tablet Take 1 tablet by mouth every 4 (four) hours as needed (for dental procedure/ pt unsure of tablets).     Marland Kitchen lisinopril-hydrochlorothiazide (PRINZIDE,ZESTORETIC) 20-12.5 MG per tablet Take 1 tablet by mouth  daily. 100 tablet 3  . metoprolol tartrate (LOPRESSOR) 25 MG tablet Take 1 tablet (25 mg total) by mouth 2 (two) times daily. 180 tablet 3  . Multiple Vitamin (MULTIVITAMIN) capsule Take 1 capsule by mouth daily.      . nitroGLYCERIN (NITROSTAT) 0.4 MG SL tablet Place 1 tablet (0.4 mg total) under the tongue every 5 (five) minutes as needed. 25 tablet 1  . sildenafil (VIAGRA) 100 MG tablet Take 1 tablet (100 mg total) by mouth daily as needed. 10 tablet 11   No current facility-administered medications for this visit.    Allergies: Allergies  Allergen Reactions  . Other Other (See Comments)    Histinex - causes tingling    Social History: The patient  reports that he quit smoking about 5 years ago. He has never used smokeless tobacco. He reports that he drinks about 3.0 oz of alcohol per week. He reports that he does not use illicit drugs.   Family History: The patient's family history includes Coronary artery disease in his father; Hyperlipidemia in his other; Hypertension in his other. There is no history of Colon cancer.   Review of Systems: Please see the history of present illness.   All other systems are reviewed and negative.   Physical Exam: VS:  BP 140/78 mmHg  Pulse 88  Ht 5\' 10"  (1.778 m)  Wt 238 lb 1.9 oz (108.011 kg)  BMI 34.17 kg/m2 .  BMI Body mass index is 34.17 kg/(m^2).  Wt Readings from Last 3 Encounters:  03/11/15 238 lb 1.9 oz (108.011 kg)  01/28/15 237 lb 12.8 oz (107.865 kg)  01/27/15 242 lb (109.77 kg)    General: Pleasant. Well developed, well nourished and in no acute distress.  HEENT: Normal. Neck: Supple, no JVD, carotid bruits, or masses noted.  Cardiac: Regular rate and rhythm. No murmurs, rubs, or gallops. No edema.  Respiratory:  Lungs are clear to auscultation bilaterally with normal work of breathing.  GI: Soft and nontender.  MS: No deformity or atrophy. Gait and ROM intact. Skin: Warm and dry. Color is normal.  Neuro:  Strength and  sensation are intact and no gross focal deficits noted.  Psych: Alert, appropriate and with normal affect.   LABORATORY DATA:  EKG:  EKG is not ordered today.  Lab Results  Component Value Date   WBC 13.8* 01/28/2015   HGB 17.0 01/28/2015   HCT 49.1 01/28/2015   PLT 185.0 01/28/2015   GLUCOSE 119* 01/28/2015   CHOL 123 01/21/2015   TRIG 150.0* 01/21/2015   HDL 31.60* 01/21/2015   LDLDIRECT 66.7 11/30/2011   LDLCALC 61  01/21/2015   ALT 25 01/21/2015   AST 17 01/21/2015   NA 141 01/28/2015   K 4.2 01/28/2015   CL 105 01/28/2015   CREATININE 1.43 01/28/2015   BUN 22 01/28/2015   CO2 26 01/28/2015   TSH 2.10 01/21/2015   PSA 1.02 01/21/2015    BNP (last 3 results) No results for input(s): BNP in the last 8760 hours.  ProBNP (last 3 results) No results for input(s): PROBNP in the last 8760 hours.   Other Studies Reviewed Today:   Assessment/Plan: 1. CAD/recent NSTEMI - with PCI to SVG to RCA - EF 45%. Cath film has been reviewed with Dr. Burt Knack last month. He is doing well clinically. Continue with his current regimen. DAPT for at least one year.   2. Mild LV dysfunction - increase beta blocker. On ACE. Try to titrate meds and plan to get echo in 3 months. Will need echo at the end of May  3. HLD - on statin   4. HTN - BP is good on his current regimen.   5. Obesity  Current medicines are reviewed with the patient today.  The patient does not have concerns regarding medicines other than what has been noted above.  The following changes have been made:  See above.  Labs/ tests ordered today include:   No orders of the defined types were placed in this encounter.     Disposition:   FU with me in 4 months.   Patient is agreeable to this plan and will call if any problems develop in the interim.   Signed: [name], RN, ANP-C 03/11/2015 3:27 PM  [facility] [address] [place], [place]  [address] Phone:  [phone] Fax: [phone]

## 2015-03-11 ENCOUNTER — Ambulatory Visit (INDEPENDENT_AMBULATORY_CARE_PROVIDER_SITE_OTHER): Payer: Medicare Other | Admitting: Nurse Practitioner

## 2015-03-11 ENCOUNTER — Encounter: Payer: Self-pay | Admitting: Nurse Practitioner

## 2015-03-11 VITALS — BP 140/78 | HR 88 | Ht 70.0 in | Wt 238.1 lb

## 2015-03-11 DIAGNOSIS — I259 Chronic ischemic heart disease, unspecified: Secondary | ICD-10-CM | POA: Diagnosis not present

## 2015-03-11 MED ORDER — METOPROLOL TARTRATE 25 MG PO TABS: 25.0000 mg | ORAL_TABLET | Freq: Two times a day (BID) | ORAL | Status: DC

## 2015-03-11 MED ORDER — CLOPIDOGREL BISULFATE 75 MG PO TABS: 75.0000 mg | ORAL_TABLET | Freq: Every day | ORAL | Status: DC

## 2015-03-11 NOTE — Patient Instructions (Signed)
We will be checking the following labs today - NONE   Medication Instructions:    Continue with your current medicines.   I have refilled your Plavix and Metoprolol    Testing/Procedures To Be Arranged:  Echocardiogram at the end of May  Follow-Up:   We will see you back in 4 months with fasting labs    Other Special Instructions:   Ok to resume your bike riding  Monitor your BP and call us if it is lower and you are symptomatic (lightheaded, dizzy, etc)  Call the [place] office at [phone] if you have any questions, problems or concerns.

## 2015-04-05 ENCOUNTER — Encounter: Payer: Self-pay | Admitting: Family Medicine

## 2015-04-05 ENCOUNTER — Encounter: Payer: Self-pay | Admitting: Nurse Practitioner

## 2015-04-07 ENCOUNTER — Other Ambulatory Visit: Payer: Self-pay | Admitting: *Deleted

## 2015-04-07 ENCOUNTER — Telehealth: Payer: Self-pay | Admitting: *Deleted

## 2015-04-07 ENCOUNTER — Other Ambulatory Visit: Payer: Self-pay

## 2015-04-07 ENCOUNTER — Ambulatory Visit (HOSPITAL_COMMUNITY): Payer: Medicare Other | Attending: Cardiovascular Disease

## 2015-04-07 DIAGNOSIS — I259 Chronic ischemic heart disease, unspecified: Secondary | ICD-10-CM | POA: Diagnosis not present

## 2015-04-07 DIAGNOSIS — I10 Essential (primary) hypertension: Secondary | ICD-10-CM

## 2015-04-07 MED ORDER — LISINOPRIL-HYDROCHLOROTHIAZIDE 20-12.5 MG PO TABS: 0.5000 | ORAL_TABLET | Freq: Every day | ORAL | Status: DC

## 2015-04-07 NOTE — Telephone Encounter (Signed)
S/w with pt is a agreeable to plan will cut in half lisinopril-hctz ( 10-6.25 mg ) Will continue to monitor bp and is not coming in for EKG, pt is schedule for an echo.

## 2015-05-12 ENCOUNTER — Other Ambulatory Visit: Payer: Self-pay

## 2015-06-02 ENCOUNTER — Other Ambulatory Visit: Payer: Self-pay | Admitting: Family Medicine

## 2015-06-16 ENCOUNTER — Encounter: Payer: Self-pay | Admitting: Family Medicine

## 2015-07-09 ENCOUNTER — Encounter: Payer: Self-pay | Admitting: Nurse Practitioner

## 2015-07-09 ENCOUNTER — Ambulatory Visit (INDEPENDENT_AMBULATORY_CARE_PROVIDER_SITE_OTHER): Payer: Medicare Other | Admitting: Nurse Practitioner

## 2015-07-09 VITALS — BP 140/92 | HR 60 | Ht 70.0 in | Wt 221.8 lb

## 2015-07-09 DIAGNOSIS — I10 Essential (primary) hypertension: Secondary | ICD-10-CM

## 2015-07-09 DIAGNOSIS — E785 Hyperlipidemia, unspecified: Secondary | ICD-10-CM | POA: Diagnosis not present

## 2015-07-09 DIAGNOSIS — I25810 Atherosclerosis of coronary artery bypass graft(s) without angina pectoris: Secondary | ICD-10-CM | POA: Diagnosis not present

## 2015-07-09 DIAGNOSIS — I259 Chronic ischemic heart disease, unspecified: Secondary | ICD-10-CM

## 2015-07-09 LAB — BASIC METABOLIC PANEL
BUN: 14 mg/dL (ref 6–23)
CO2: 26 mEq/L (ref 19–32)
Calcium: 9.5 mg/dL (ref 8.4–10.5)
Chloride: 105 mEq/L (ref 96–112)
Creatinine, Ser: 1.08 mg/dL (ref 0.40–1.50)
GFR: 72.25 mL/min (ref >60.00)
Glucose, Bld: 95 mg/dL (ref 70–99)
Potassium: 3.8 mEq/L (ref 3.5–5.1)
Sodium: 139 mEq/L (ref 135–145)

## 2015-07-09 LAB — LIPID PANEL
Cholesterol: 129 mg/dL (ref 0–200)
HDL: 34.8 mg/dL — ABNORMAL LOW (ref >39.00)
LDL Cholesterol: 73 mg/dL (ref 0–99)
NonHDL: 94.44
Total CHOL/HDL Ratio: 4
Triglycerides: 109 mg/dL (ref 0.0–149.0)
VLDL: 21.8 mg/dL (ref 0.0–40.0)

## 2015-07-09 LAB — HEPATIC FUNCTION PANEL
ALT: 27 U/L (ref 0–53)
AST: 23 U/L (ref 0–37)
Albumin: 4.1 g/dL (ref 3.5–5.2)
Alkaline Phosphatase: 75 U/L (ref 39–117)
Bilirubin, Direct: 0.2 mg/dL (ref 0.0–0.3)
Total Bilirubin: 1 mg/dL (ref 0.2–1.2)
Total Protein: 6.8 g/dL (ref 6.0–8.3)

## 2015-07-09 NOTE — Progress Notes (Signed)
CARDIOLOGY OFFICE NOTE  Date:  07/09/2015    Dave Robles Date of Birth: 30-Mar-1946 Medical Record #[id number]  PCP:  Joycelyn Man, MD  Cardiologist:  Aundra Dubin    Chief Complaint  Patient presents with  . Coronary Artery Disease    4 month check - seen for Dr. Aundra Dubin    History of Present Illness: Dave Robles is a 69 y.o. male who presents today for a 4 month check. Seen for Dr. Aundra Dubin. He has HTN, HLD, CAD with past CABG in 1991, negative Myoview in 2011 with EF 67% and no ischemia, ED, tobacco abuse, asthma and depression.   Seen in April of 2014 - he had had a brief syncopal spell six weeks prior to that visit - Myoview was updated - this looked good.  Seen back in March of 2016 after being out in [place] for a long vacation the month prior. Had been exercising pretty hard - riding his bike about 15 miles per day. One day he just did not feel well. No real chest pain but was concerned and went to the ER. Told them he had CAD. EKG was ok. Troponin was +. He was cathed - noted to have NL left main, 90% prox LAD, 99% mid LAD with patent LIMA to mid LAD and patent SVG to DX. LCX origin stenosis vs flow void? Fistula vs competitive flow in branch?, 100% RCA, SVG to RCA 99% origin treated with Xience DES and PTCA to the distal. He is on Plavix. He was told that "there was something squirting" and that he possibly needs a CT scan???? Placed on Metoprolol. No one restricted his activites - he is already back riding his bike about 12 miles a day. He felt fine. No chest pain - but he never had.   His Cath CD was reviewed with Dr. 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 Burt Knack did NOT feel any treatment is necessary - this is oxygenated blood going to oxygenated blood.  I increased his beta blocker and advised that we needed to up titrate his medicines due to his LV dysfunction. His EF was 45 to 50%.   Last seen back  in April and he was doing well. Wanting to get back to his long bike rides and embark on a plan to lose weight.   He sent [place] a message back in May - BP low at home - a little dizzy - he was successfully losing weight and we cut his lisinopril/hct in half.   Comes back today. Here alone.  He is doing ok. No chest pain. Not short of breath. Feels good. Back with his long bike rides. Has lost 17 pounds. Feels good. Not dizzy. BP at home pretty good. Was up while he was on vacation but also using a wrist cuff during that time.   Past Medical History  Diagnosis Date  . HYPERLIPIDEMIA 07/10/2007  . ERECTILE DYSFUNCTION 07/10/2007  . TOBACCO USE 12/15/2009  . Adjustment disorder with depressed mood 03/04/2009  . HYPERTENSION 07/10/2007  . CORONARY ARTERY DISEASE 07/10/2007  . Pneumonia due to Mycoplasma pneumoniae 09/10/2008  . Extrinsic asthma, unspecified 09/10/2008    Past Surgical History  Procedure Laterality Date  . Coronary artery bypass graft  11/15/89    5 vessels  . Inguinal hernia repair Left 1985  . Hydrocele excision / repair  2011  . Colonoscopy  Medications: Current Outpatient Prescriptions  Medication Sig Dispense Refill  . amoxicillin (AMOXIL) 500 MG capsule Take 500 mg by mouth 2 (two) times daily. Prior to dental procedure  0  . aspirin 81 MG tablet Take 81 mg by mouth daily.      Marland Kitchen atorvastatin (LIPITOR) 80 MG tablet TAKE 1 TABLET (80 MG TOTAL) BY MOUTH DAILY. 90 tablet 3  . clopidogrel (PLAVIX) 75 MG tablet Take 1 tablet (75 mg total) by mouth daily. 90 tablet 3  . cyclobenzaprine (FLEXERIL) 10 MG tablet TAKE ONE TABLET BY MOUTH NIGHTLY AT BEDTIME AS NEEDED FOR MUSCLE SPASM 90 tablet 3  . fluorouracil (EFUDEX) 5 % cream APPLY TO AFFECTED AREA ONCE DAILY FOR 3 TO 4 WEEKS for pre-cancerous  0  . HYDROcodone-acetaminophen (NORCO/VICODIN) 5-325 MG per tablet Take 1 tablet by mouth every 4 (four) hours as needed (for dental procedure/ pt unsure of tablets).     Marland Kitchen  lisinopril-hydrochlorothiazide (PRINZIDE,ZESTORETIC) 20-12.5 MG per tablet Take 0.5 tablets by mouth daily. 100 tablet 3  . metoprolol tartrate (LOPRESSOR) 25 MG tablet Take 1 tablet (25 mg total) by mouth 2 (two) times daily. 180 tablet 3  . Multiple Vitamin (MULTIVITAMIN) capsule Take 1 capsule by mouth daily.      . nitroGLYCERIN (NITROSTAT) 0.4 MG SL tablet Place 1 tablet (0.4 mg total) under the tongue every 5 (five) minutes as needed. 25 tablet 1  . sildenafil (VIAGRA) 100 MG tablet Take 1 tablet (100 mg total) by mouth daily as needed. 10 tablet 11   No current facility-administered medications for this visit.    Allergies: Allergies  Allergen Reactions  . Other Other (See Comments)    Histinex - causes tingling    Social History: The patient  reports that he quit smoking about 5 years ago. He has never used smokeless tobacco. He reports that he drinks about 3.0 oz of alcohol per week. He reports that he does not use illicit drugs.   Family History: The patient's family history includes Coronary artery disease in his father; Hyperlipidemia in his other; Hypertension in his other. There is no history of Colon cancer.   Review of Systems: Please see the history of present illness.   Otherwise, the review of systems is positive for none.   All other systems are reviewed and negative.   Physical Exam: VS:  BP 140/92 mmHg  Pulse 60  Ht 5\' 10"  (1.778 m)  Wt 221 lb 12.8 oz (100.608 kg)  BMI 31.83 kg/m2  SpO2 96% .  BMI Body mass index is 31.83 kg/(m^2).  Wt Readings from Last 3 Encounters:  07/09/15 221 lb 12.8 oz (100.608 kg)  03/11/15 238 lb 1.9 oz (108.011 kg)  01/28/15 237 lb 12.8 oz (107.865 kg)   Repeat BP by me is 130/80.   General: Pleasant. Well developed, well nourished and in no acute distress. He has lost 17 pounds since last visit.  HEENT: Normal. Neck: Supple, no JVD, carotid bruits, or masses noted.  Cardiac: Regular rate and rhythm. No murmurs, rubs, or  gallops. No edema.  Respiratory:  Lungs are clear to auscultation bilaterally with normal work of breathing.  GI: Soft and nontender.  MS: No deformity or atrophy. Gait and ROM intact. Skin: Warm and dry. Color is normal.  Neuro:  Strength and sensation are intact and no gross focal deficits noted.  Psych: Alert, appropriate and with normal affect.   LABORATORY DATA:  EKG:  EKG is not ordered today.  Lab Results  Component Value Date   WBC 13.8* 01/28/2015   HGB 17.0 01/28/2015   HCT 49.1 01/28/2015   PLT 185.0 01/28/2015   GLUCOSE 119* 01/28/2015   CHOL 123 01/21/2015   TRIG 150.0* 01/21/2015   HDL 31.60* 01/21/2015   LDLDIRECT 66.7 11/30/2011   LDLCALC 61 01/21/2015   ALT 25 01/21/2015   AST 17 01/21/2015   NA 141 01/28/2015   K 4.2 01/28/2015   CL 105 01/28/2015   CREATININE 1.43 01/28/2015   BUN 22 01/28/2015   CO2 26 01/28/2015   TSH 2.10 01/21/2015   PSA 1.02 01/21/2015    BNP (last 3 results) No results for input(s): BNP in the last 8760 hours.  ProBNP (last 3 results) No results for input(s): PROBNP in the last 8760 hours.   Other Studies Reviewed Today:  Echo Study Conclusions from 03/2015  - Left ventricle: The cavity size was normal. Systolic function was normal. The estimated ejection fraction was in the range of 55% to 60%. Wall motion was normal; there were no regional wall motion abnormalities. Left ventricular diastolic function parameters were normal. - Left atrium: The atrium was mildly dilated. - Atrial septum: No defect or patent foramen ovale was identified  Assessment/Plan: 1. CAD/recent NSTEMI - with PCI to SVG to RCA in March of 2017 - EF 45%. Cath film has been reviewed with Dr. Burt Knack last month. He is doing well clinically. Continue with his current regimen. DAPT for at least one year.   2. Mild LV dysfunction - has had resolution per echo from May of 2016.   3. HLD - on statin - checking labs today  4. HTN - BP ok on  his current regimen.   5. Obesity  Current medicines are reviewed with the patient today.  The patient does not have concerns regarding medicines other than what has been noted above.  The following changes have been made:  See above.  Labs/ tests ordered today include:    Orders Placed This Encounter  Procedures  . Basic metabolic panel  . Hepatic function panel  . Lipid panel     Disposition:   FU with me in 5 months.   Patient is agreeable to this plan and will call if any problems develop in the interim.   Signed: [name], RN, ANP-C 07/09/2015 8:19 AM  [facility] [address] [place], [place]  [address] Phone: [phone] Fax: [phone]

## 2015-07-09 NOTE — Patient Instructions (Addendum)
We will be checking the following labs today - BMET, Lipids and HPF   Medication Instructions:    Continue with your current medicines.     Testing/Procedures To Be Arranged:  N/A  Follow-Up:   See me in 6 months    Other Special Instructions:   Keep up the good work  Goal BP is < 135/85 most of the time  Call the [place] office at [phone] if you have any questions, problems or concerns.

## 2015-08-19 ENCOUNTER — Telehealth: Payer: Self-pay | Admitting: *Deleted

## 2015-08-19 ENCOUNTER — Ambulatory Visit (INDEPENDENT_AMBULATORY_CARE_PROVIDER_SITE_OTHER): Payer: Medicare Other

## 2015-08-19 DIAGNOSIS — Z23 Encounter for immunization: Secondary | ICD-10-CM | POA: Diagnosis not present

## 2015-08-19 NOTE — Telephone Encounter (Signed)
error 

## 2015-11-25 ENCOUNTER — Ambulatory Visit (INDEPENDENT_AMBULATORY_CARE_PROVIDER_SITE_OTHER): Payer: Medicare Other | Admitting: Family Medicine

## 2015-11-25 ENCOUNTER — Other Ambulatory Visit: Payer: Self-pay | Admitting: *Deleted

## 2015-11-25 ENCOUNTER — Ambulatory Visit (INDEPENDENT_AMBULATORY_CARE_PROVIDER_SITE_OTHER)
Admission: RE | Admit: 2015-11-25 | Discharge: 2015-11-25 | Disposition: A | Discharge status: Home / Self Care | Payer: Medicare Other | Source: Ambulatory Visit | Attending: Family Medicine | Admitting: Family Medicine

## 2015-11-25 ENCOUNTER — Encounter: Payer: Self-pay | Admitting: Family Medicine

## 2015-11-25 VITALS — BP 140/80 | Temp 98.8°F | Wt 236.0 lb

## 2015-11-25 DIAGNOSIS — J4520 Mild intermittent asthma, uncomplicated: Secondary | ICD-10-CM

## 2015-11-25 DIAGNOSIS — I25810 Atherosclerosis of coronary artery bypass graft(s) without angina pectoris: Secondary | ICD-10-CM

## 2015-11-25 DIAGNOSIS — E785 Hyperlipidemia, unspecified: Secondary | ICD-10-CM

## 2015-11-25 DIAGNOSIS — I10 Essential (primary) hypertension: Secondary | ICD-10-CM

## 2015-11-25 MED ORDER — NITROGLYCERIN 0.4 MG SL SUBL: 0.4000 mg | SUBLINGUAL_TABLET | SUBLINGUAL | Status: DC | PRN

## 2015-11-25 MED ORDER — CLOPIDOGREL BISULFATE 75 MG PO TABS: 75.0000 mg | ORAL_TABLET | Freq: Every day | ORAL | Status: DC

## 2015-11-25 MED ORDER — CYCLOBENZAPRINE HCL 10 MG PO TABS: ORAL_TABLET | ORAL | Status: DC

## 2015-11-25 MED ORDER — METOPROLOL TARTRATE 25 MG PO TABS: 25.0000 mg | ORAL_TABLET | Freq: Two times a day (BID) | ORAL | Status: DC

## 2015-11-25 MED ORDER — LISINOPRIL-HYDROCHLOROTHIAZIDE 20-12.5 MG PO TABS: 0.5000 | ORAL_TABLET | Freq: Every day | ORAL | Status: DC

## 2015-11-25 MED ORDER — HYDROCODONE-HOMATROPINE 5-1.5 MG/5ML PO SYRP
ORAL_SOLUTION | ORAL | Status: DC
Start: 2015-11-25 — End: 2016-09-20

## 2015-11-25 MED ORDER — ATORVASTATIN CALCIUM 80 MG PO TABS: ORAL_TABLET | ORAL | Status: DC

## 2015-11-25 MED ORDER — PREDNISONE 20 MG PO TABS: ORAL_TABLET | ORAL | Status: DC

## 2015-11-25 NOTE — Progress Notes (Signed)
Pre visit review using our clinic review tool, if applicable. No additional management support is needed unless otherwise documented below in the visit note. 

## 2015-11-25 NOTE — Patient Instructions (Signed)
Drink lots of water  Prednisone 20 mg.......... 2 tabs 3 days,... One tab 3 days,....... half a tab 3 days,.......... then stop if the cough is gone......... if not then take a half a tab Monday Wednesday Friday for a two-week taper  Hydromet.........Marland Kitchen 1/2-1 teaspoon at bedtime for nighttime cough  Go to the main office on [address] now for a chest x-ray

## 2015-11-25 NOTE — Progress Notes (Signed)
   Subjective:    Patient ID: Dave Robles, male    DOB: 1946-01-05, 70 y.o.   MRN: [id number]  HPI Dave Robles is a 70 year old married male nonsmoker,,,,,,,, did smoke in the past,,,,, who comes in today with a two-week history of cough  He developed head congestion some fever and a slight sore throat. All that last 3 or 4 days and went away. In the cough started. He's had some greenish sputum and also seen some blood.  He has smoked in the past  He says he feels about 80% better but the cough just won't go away.  Historical he's had some wheezing in the past when he gets a viral infection secondary to underlying smoking which he no longer does.   Review of Systems Review of systems otherwise negative,,,,,, no unexplained weight loss chest pain etc. etc.    Objective:   Physical Exam  Well-developed well-nourished male no acute distress vital signs stable he is afebrile HEENT were negative except for wax left ear which was removed with irrigation  Neck was supple no adenopathy lungs are clear except for some mild wheezing on forced expiration      Assessment & Plan:  Viral syndrome with persistent cough with wheezing........ treat symptomatically with liquids cough syrup and short course of prednisone,,,,,,,,,, chest x-ray to rule out any underlying pulmonary pathology  Cerumen impaction left ear,,,,,,,,,,,,,,,,, removed by irrigation,,,

## 2015-12-03 ENCOUNTER — Encounter: Payer: Self-pay | Admitting: Nurse Practitioner

## 2015-12-03 ENCOUNTER — Ambulatory Visit (INDEPENDENT_AMBULATORY_CARE_PROVIDER_SITE_OTHER): Payer: Medicare Other | Admitting: Nurse Practitioner

## 2015-12-03 VITALS — BP 110/62 | HR 56 | Ht 70.0 in | Wt 234.8 lb

## 2015-12-03 DIAGNOSIS — I25810 Atherosclerosis of coronary artery bypass graft(s) without angina pectoris: Secondary | ICD-10-CM | POA: Diagnosis not present

## 2015-12-03 DIAGNOSIS — I259 Chronic ischemic heart disease, unspecified: Secondary | ICD-10-CM | POA: Diagnosis not present

## 2015-12-03 DIAGNOSIS — I10 Essential (primary) hypertension: Secondary | ICD-10-CM

## 2015-12-03 DIAGNOSIS — E785 Hyperlipidemia, unspecified: Secondary | ICD-10-CM

## 2015-12-03 NOTE — Patient Instructions (Addendum)
We will be checking the following labs today - NONE   Medication Instructions:    Continue with your current medicines.     Testing/Procedures To Be Arranged:  N/A  Follow-Up:   See me in 8 months    Other Special Instructions:   Try to get back to losing weight    If you need a refill on your cardiac medications before your next appointment, please call your pharmacy.   Call the [place] office at [phone] if you have any questions, problems or concerns.

## 2015-12-03 NOTE — Progress Notes (Signed)
CARDIOLOGY OFFICE NOTE  Date:  12/03/2015    Dave Robles Date of Birth: 01/26/1946 Medical Record [id number]  PCP:  Joycelyn Man, MD  Cardiologist:  Aundra Dubin    Chief Complaint  Patient presents with  . Coronary Artery Disease  . Cardiomyopathy  . Hypertension  . Hyperlipidemia    5 month check - seen for Dr. Aundra Dubin    History of Present Illness: Dave Robles is a 70 y.o. male who presents today for a 5 month check. Seen for Dr. Aundra Dubin. He has HTN, HLD, CAD with past CABG in 1991, negative Myoview in 2011 with EF 67% and no ischemia, ED, tobacco abuse, asthma and depression.   Seen in April of 2014 - he had had a brief syncopal spell six weeks prior to that visit - Myoview was updated - this looked good.  Seen back in March of 2016 after being out in [place] for a long vacation the month prior. Had been exercising pretty hard - riding his bike about 15 miles per day. One day he just did not feel well. No real chest pain but was concerned and went to the ER. Told them he had CAD. EKG was ok. Troponin was +. He was cathed - noted to have NL left main, 90% prox LAD, 99% mid LAD with patent LIMA to mid LAD and patent SVG to DX. LCX origin stenosis vs flow void? Fistula vs competitive flow in branch?, 100% RCA, SVG to RCA 99% origin treated with Xience DES and PTCA to the distal. He is on Plavix. He was told that "there was something squirting" and that he possibly needs a CT scan???? Placed on Metoprolol.   His Cath CD was reviewed with Dr. 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 Burt Knack did NOT feel any treatment is necessary - this is oxygenated blood going to oxygenated blood.  I have increased his beta blocker and advised that we needed to up titrate his medicines due to his LV dysfunction. His EF was 45 to 50%.   Last seen back in August and he was doing well. Had lost weight. BP lower and we have cut  medicines back. Back taking long rides on his bike.    Comes back today. Here alone.Doing ok. Weight is back up and he has gotten off track with his diet. Still riding his bike without issue. No chest pain. Not short of breath. For a physical and labs in May with Dr. Sherren Mocha. Planning trip to [place] in a few weeks. He is happy with how he is doing overall. No real issue.     Past Medical History  Diagnosis Date  . HYPERLIPIDEMIA 07/10/2007  . ERECTILE DYSFUNCTION 07/10/2007  . TOBACCO USE 12/15/2009  . Adjustment disorder with depressed mood 03/04/2009  . HYPERTENSION 07/10/2007  . CORONARY ARTERY DISEASE 07/10/2007  . Pneumonia due to Mycoplasma pneumoniae 09/10/2008  . Extrinsic asthma, unspecified 09/10/2008    Past Surgical History  Procedure Laterality Date  . Coronary artery bypass graft  11/15/89    5 vessels  . Inguinal hernia repair Left 1985  . Hydrocele excision / repair  2011  . Colonoscopy       Medications: Current Outpatient Prescriptions  Medication Sig Dispense Refill  . amoxicillin (AMOXIL) 500 MG capsule Take 500 mg by mouth 2 (two) times daily. Prior to dental procedure  0  . aspirin 81 MG tablet  Take 81 mg by mouth daily.      Marland Kitchen atorvastatin (LIPITOR) 80 MG tablet TAKE 1 TABLET (80 MG TOTAL) BY MOUTH DAILY. 90 tablet 3  . clopidogrel (PLAVIX) 75 MG tablet Take 1 tablet (75 mg total) by mouth daily. 90 tablet 3  . cyclobenzaprine (FLEXERIL) 10 MG tablet TAKE ONE TABLET BY MOUTH NIGHTLY AT BEDTIME AS NEEDED FOR MUSCLE SPASM 90 tablet 3  . fluorouracil (EFUDEX) 5 % cream APPLY TO AFFECTED AREA ONCE DAILY FOR 3 TO 4 WEEKS for pre-cancerous  0  . HYDROcodone-acetaminophen (NORCO/VICODIN) 5-325 MG per tablet Take 1 tablet by mouth every 4 (four) hours as needed (for dental procedure/ pt unsure of tablets).     Marland Kitchen HYDROcodone-homatropine (HYCODAN) 5-1.5 MG/5ML syrup 1/2-1 teaspoon at bedtime when necessary for cough 120 mL 0  . lisinopril-hydrochlorothiazide  (PRINZIDE,ZESTORETIC) 20-12.5 MG tablet Take 0.5 tablets by mouth daily. 100 tablet 3  . metoprolol tartrate (LOPRESSOR) 25 MG tablet Take 1 tablet (25 mg total) by mouth 2 (two) times daily. 180 tablet 3  . Multiple Vitamin (MULTIVITAMIN) capsule Take 1 capsule by mouth daily.      . nitroGLYCERIN (NITROSTAT) 0.4 MG SL tablet Place 1 tablet (0.4 mg total) under the tongue every 5 (five) minutes as needed. 25 tablet 1  . predniSONE (DELTASONE) 20 MG tablet 2 tabs x 3 days, 1 tab x 3 days, 1/2 tab x 3 days, 1/2 tab M,W,F x 2 weeks 40 tablet 1  . sildenafil (VIAGRA) 100 MG tablet Take 1 tablet (100 mg total) by mouth daily as needed. 10 tablet 11   No current facility-administered medications for this visit.    Allergies: Allergies  Allergen Reactions  . Other Other (See Comments)    Histinex - causes tingling    Social History: The patient  reports that he quit smoking about 5 years ago. He has never used smokeless tobacco. He reports that he drinks about 3.0 oz of alcohol per week. He reports that he does not use illicit drugs.   Family History: The patient's family history includes Coronary artery disease in his father; Hyperlipidemia in his other; Hypertension in his other. There is no history of Colon cancer.   Review of Systems: Please see the history of present illness.   Otherwise, the review of systems is positive for none.   All other systems are reviewed and negative.   Physical Exam: VS:  BP 110/62 mmHg  Pulse 56  Ht 5\' 10"  (1.778 m)  Wt 234 lb 12.8 oz (106.505 kg)  BMI 33.69 kg/m2 .  BMI Body mass index is 33.69 kg/(m^2).  Wt Readings from Last 3 Encounters:  12/03/15 234 lb 12.8 oz (106.505 kg)  11/25/15 236 lb (107.049 kg)  07/09/15 221 lb 12.8 oz (100.608 kg)    General: Pleasant. He is obese. His weight has gone back up. He is alert and in no acute distress.  HEENT: Normal. Neck: Supple, no JVD, carotid bruits, or masses noted.  Cardiac: Regular rate and  rhythm. No murmurs, rubs, or gallops. No edema.  Respiratory:  Lungs are clear to auscultation bilaterally with normal work of breathing.  GI: Soft and nontender.  MS: No deformity or atrophy. Gait and ROM intact. Skin: Warm and dry. Color is normal.  Neuro:  Strength and sensation are intact and no gross focal deficits noted.  Psych: Alert, appropriate and with normal affect.   LABORATORY DATA:  EKG:  EKG is not ordered today.  Lab Results  Component Value Date   WBC 13.8* 01/28/2015   HGB 17.0 01/28/2015   HCT 49.1 01/28/2015   PLT 185.0 01/28/2015   GLUCOSE 95 07/09/2015   CHOL 129 07/09/2015   TRIG 109.0 07/09/2015   HDL 34.80* 07/09/2015   LDLDIRECT 66.7 11/30/2011   LDLCALC 73 07/09/2015   ALT 27 07/09/2015   AST 23 07/09/2015   NA 139 07/09/2015   K 3.8 07/09/2015   CL 105 07/09/2015   CREATININE 1.08 07/09/2015   BUN 14 07/09/2015   CO2 26 07/09/2015   TSH 2.10 01/21/2015   PSA 1.02 01/21/2015    BNP (last 3 results) No results for input(s): BNP in the last 8760 hours.  ProBNP (last 3 results) No results for input(s): PROBNP in the last 8760 hours.   Other Studies Reviewed Today:  Echo Study Conclusions from 03/2015  - Left ventricle: The cavity size was normal. Systolic function was normal. The estimated ejection fraction was in the range of 55% to 60%. Wall motion was normal; there were no regional wall motion abnormalities. Left ventricular diastolic function parameters were normal. - Left atrium: The atrium was mildly dilated. - Atrial septum: No defect or patent foramen ovale was identified  Assessment/Plan: 1. CAD/prior NSTEMI - with PCI to SVG to RCA in March of 2017 - EF 45%. Cath film has been reviewed with Dr. Burt Knack also. No further intervention felt to be needed and will continue with medical management. He is doing well clinically. I have elected to keep him on DAPT.   2. Mild LV dysfunction - with resolution per echo from May of  2016.   3. HLD - on statin - labs with his physical in May by PCP  4. HTN - BP looks good. No change in his current regimen.  5. Obesity - he knows he needs to get his weight going back the other direction.    Current medicines are reviewed with the patient today.  The patient does not have concerns regarding medicines other than what has been noted above.  The following changes have been made:  See above.  Labs/ tests ordered today include:   No orders of the defined types were placed in this encounter.     Disposition:   FU with me in 8 months.   Patient is agreeable to this plan and will call if any problems develop in the interim.   Signed: [name], RN, ANP-C 12/03/2015 8:19 AM  [facility] [address] [place], [place]  [address] Phone: [phone] Fax: [phone]

## 2015-12-03 NOTE — Progress Notes (Deleted)
CARDIOLOGY OFFICE NOTE  Date:  12/03/2015    Dave Robles Date of Birth: August 07, 1946 Medical Record [id number]  PCP:  Joycelyn Man, MD  Cardiologist:  ***    No chief complaint on file.   History of Present Illness: Dave Robles is a 70 y.o. male who presents today for a ***   Past Medical History  Diagnosis Date  . HYPERLIPIDEMIA 07/10/2007  . ERECTILE DYSFUNCTION 07/10/2007  . TOBACCO USE 12/15/2009  . Adjustment disorder with depressed mood 03/04/2009  . HYPERTENSION 07/10/2007  . CORONARY ARTERY DISEASE 07/10/2007  . Pneumonia due to Mycoplasma pneumoniae 09/10/2008  . Extrinsic asthma, unspecified 09/10/2008    Past Surgical History  Procedure Laterality Date  . Coronary artery bypass graft  11/15/89    5 vessels  . Inguinal hernia repair Left 1985  . Hydrocele excision / repair  2011  . Colonoscopy       Medications: Current Outpatient Prescriptions  Medication Sig Dispense Refill  . amoxicillin (AMOXIL) 500 MG capsule Take 500 mg by mouth 2 (two) times daily. Prior to dental procedure  0  . aspirin 81 MG tablet Take 81 mg by mouth daily.      Marland Kitchen atorvastatin (LIPITOR) 80 MG tablet TAKE 1 TABLET (80 MG TOTAL) BY MOUTH DAILY. 90 tablet 3  . clopidogrel (PLAVIX) 75 MG tablet Take 1 tablet (75 mg total) by mouth daily. 90 tablet 3  . cyclobenzaprine (FLEXERIL) 10 MG tablet TAKE ONE TABLET BY MOUTH NIGHTLY AT BEDTIME AS NEEDED FOR MUSCLE SPASM 90 tablet 3  . fluorouracil (EFUDEX) 5 % cream APPLY TO AFFECTED AREA ONCE DAILY FOR 3 TO 4 WEEKS for pre-cancerous  0  . HYDROcodone-acetaminophen (NORCO/VICODIN) 5-325 MG per tablet Take 1 tablet by mouth every 4 (four) hours as needed (for dental procedure/ pt unsure of tablets).     Marland Kitchen HYDROcodone-homatropine (HYCODAN) 5-1.5 MG/5ML syrup 1/2-1 teaspoon at bedtime when necessary for cough 120 mL 0  . lisinopril-hydrochlorothiazide (PRINZIDE,ZESTORETIC) 20-12.5 MG tablet Take 0.5 tablets by mouth daily. 100  tablet 3  . metoprolol tartrate (LOPRESSOR) 25 MG tablet Take 1 tablet (25 mg total) by mouth 2 (two) times daily. 180 tablet 3  . Multiple Vitamin (MULTIVITAMIN) capsule Take 1 capsule by mouth daily.      . nitroGLYCERIN (NITROSTAT) 0.4 MG SL tablet Place 1 tablet (0.4 mg total) under the tongue every 5 (five) minutes as needed. 25 tablet 1  . predniSONE (DELTASONE) 20 MG tablet 2 tabs x 3 days, 1 tab x 3 days, 1/2 tab x 3 days, 1/2 tab M,W,F x 2 weeks 40 tablet 1  . sildenafil (VIAGRA) 100 MG tablet Take 1 tablet (100 mg total) by mouth daily as needed. 10 tablet 11   No current facility-administered medications for this visit.    Allergies: Allergies  Allergen Reactions  . Other Other (See Comments)    Histinex - causes tingling    Social History: The patient  reports that he quit smoking about 5 years ago. He has never used smokeless tobacco. He reports that he drinks about 3.0 oz of alcohol per week. He reports that he does not use illicit drugs.   Family History: The patient's ***family history includes Coronary artery disease in his father; Hyperlipidemia in his other; Hypertension in his other. There is no history of Colon cancer.   Review of Systems: Please see the history of present illness.   Otherwise, the review of systems is positive for {NONE  [id number].   All other systems are reviewed and negative.   Physical Exam: VS:  There were no vitals taken for this visit. Marland Kitchen  BMI There is no weight on file to calculate BMI.  Wt Readings from Last 3 Encounters:  11/25/15 236 lb (107.049 kg)  07/09/15 221 lb 12.8 oz (100.608 kg)  03/11/15 238 lb 1.9 oz (108.011 kg)    General: Pleasant. Well developed, well nourished and in no acute distress.  HEENT: Normal. Neck: Supple, no JVD, carotid bruits, or masses noted.  Cardiac: ***Regular rate and rhythm. No murmurs, rubs, or gallops. No edema.  Respiratory:  Lungs are clear to auscultation bilaterally with normal work of  breathing.  GI: Soft and nontender.  MS: No deformity or atrophy. Gait and ROM intact. Skin: Warm and dry. Color is normal.  Neuro:  Strength and sensation are intact and no gross focal deficits noted.  Psych: Alert, appropriate and with normal affect.   LABORATORY DATA:  EKG:  EKG {ACTION; IS/IS [id number] ordered today. This demonstrates ***.  Lab Results  Component Value Date   WBC 13.8* 01/28/2015   HGB 17.0 01/28/2015   HCT 49.1 01/28/2015   PLT 185.0 01/28/2015   GLUCOSE 95 07/09/2015   CHOL 129 07/09/2015   TRIG 109.0 07/09/2015   HDL 34.80* 07/09/2015   LDLDIRECT 66.7 11/30/2011   LDLCALC 73 07/09/2015   ALT 27 07/09/2015   AST 23 07/09/2015   NA 139 07/09/2015   K 3.8 07/09/2015   CL 105 07/09/2015   CREATININE 1.08 07/09/2015   BUN 14 07/09/2015   CO2 26 07/09/2015   TSH 2.10 01/21/2015   PSA 1.02 01/21/2015    BNP (last 3 results) No results for input(s): BNP in the last 8760 hours.  ProBNP (last 3 results) No results for input(s): PROBNP in the last 8760 hours.   Other Studies Reviewed Today:   Assessment/Plan:   Current medicines are reviewed with the patient today.  The patient does not have concerns regarding medicines other than what has been noted above.  The following changes have been made:  See above.  Labs/ tests ordered today include:   No orders of the defined types were placed in this encounter.     Disposition:   FU with *** in {gen number [id number] {Days to years:10300}.   Patient is agreeable to this plan and will call if any problems develop in the interim.   Signed: [name], RN, ANP-C 12/03/2015 8:04 AM  [facility] [address] [place], [place]  [address] Phone: [phone] Fax: [phone]

## 2016-01-30 ENCOUNTER — Other Ambulatory Visit: Payer: Medicare Other

## 2016-02-03 ENCOUNTER — Encounter: Payer: Medicare Other | Admitting: Family Medicine

## 2016-02-19 ENCOUNTER — Telehealth: Payer: Self-pay | Admitting: *Deleted

## 2016-02-19 NOTE — Telephone Encounter (Signed)
-----   Message from Vaughan Browner sent at 02/18/2016  5:13 PM EDT -----   ----- Message -----    From: Carolyn Stare    Sent: 02/18/2016  12:57 PM      To: Vaughan Browner   Pt has an appt with Dr Sherren Mocha 03/30/16 received a letter to reschedule. I let him know that Dr Sherren Mocha is taking sabbatical and if he wanted to establish with someone else. He does not want an establishment visit  [place]

## 2016-02-19 NOTE — Telephone Encounter (Signed)
Spoke with patient and an appointment has been made. 

## 2016-03-24 ENCOUNTER — Other Ambulatory Visit: Payer: Medicare Other

## 2016-03-30 ENCOUNTER — Encounter: Payer: Medicare Other | Admitting: Family Medicine

## 2016-05-21 ENCOUNTER — Encounter: Payer: Self-pay | Admitting: Adult Health

## 2016-05-21 ENCOUNTER — Ambulatory Visit (INDEPENDENT_AMBULATORY_CARE_PROVIDER_SITE_OTHER): Payer: Medicare Other | Admitting: Adult Health

## 2016-05-21 VITALS — BP 128/72 | Temp 98.1°F | Ht 70.0 in | Wt 234.5 lb

## 2016-05-21 DIAGNOSIS — F329 Major depressive disorder, single episode, unspecified: Secondary | ICD-10-CM

## 2016-05-21 DIAGNOSIS — F32A Depression, unspecified: Secondary | ICD-10-CM

## 2016-05-21 MED ORDER — BUPROPION HCL ER (SR) 150 MG PO TB12: 150.0000 mg | ORAL_TABLET | Freq: Two times a day (BID) | ORAL | Status: DC

## 2016-05-21 NOTE — Progress Notes (Signed)
Subjective:    Patient ID: Dave Robles, male    DOB: 09-30-1946, 70 y.o.   MRN: [id number]  HPI  70 year old male who presents to the office today to discuss going back on depression medication. He reports that he has been feeling more depressed lately and that he would like to go back on depression medications.   He reports that he is either not doing or doesn't want to do things that he used to enjoy such as exercising or riding his bike.   He has gained some weight and his wife has been hard on him about this  Review of Systems  Constitutional: Negative.   Respiratory: Negative.   Cardiovascular: Negative.   Neurological: Negative.   Psychiatric/Behavioral: Positive for sleep disturbance and decreased concentration. Negative for suicidal ideas and self-injury.       Depressed   Past Medical History  Diagnosis Date  . HYPERLIPIDEMIA 07/10/2007  . ERECTILE DYSFUNCTION 07/10/2007  . TOBACCO USE 12/15/2009  . Adjustment disorder with depressed mood 03/04/2009  . HYPERTENSION 07/10/2007  . CORONARY ARTERY DISEASE 07/10/2007  . Pneumonia due to Mycoplasma pneumoniae 09/10/2008  . Extrinsic asthma, unspecified 09/10/2008    Social History   Social History  . Marital Status: Married    Spouse Name: N/A  . Number of Children: N/A  . Years of Education: N/A   Occupational History  . Not on file.   Social History Main Topics  . Smoking status: Former Smoker -- 0.50 packs/day    Quit date: 01/13/2010  . Smokeless tobacco: Never Used  . Alcohol Use: 3.0 oz/week    5 Cans of beer per week  . Drug Use: No  . Sexual Activity: Yes     Comment: regular exercise - yes   Other Topics Concern  . Not on file   Social History Narrative    Past Surgical History  Procedure Laterality Date  . Coronary artery bypass graft  11/15/89    5 vessels  . Inguinal hernia repair Left 1985  . Hydrocele excision / repair  2011  . Colonoscopy      Family History  Problem Relation Age  of Onset  . Coronary artery disease Father     cabg  . Hyperlipidemia Other   . Hypertension Other   . Colon cancer Neg Hx     Allergies  Allergen Reactions  . Other Other (See Comments)    Histinex - causes tingling    Current Outpatient Prescriptions on File Prior to Visit  Medication Sig Dispense Refill  . amoxicillin (AMOXIL) 500 MG capsule Take 500 mg by mouth 2 (two) times daily. Prior to dental procedure  0  . aspirin 81 MG tablet Take 81 mg by mouth daily.      Marland Kitchen atorvastatin (LIPITOR) 80 MG tablet TAKE 1 TABLET (80 MG TOTAL) BY MOUTH DAILY. 90 tablet 3  . clopidogrel (PLAVIX) 75 MG tablet Take 1 tablet (75 mg total) by mouth daily. 90 tablet 3  . cyclobenzaprine (FLEXERIL) 10 MG tablet TAKE ONE TABLET BY MOUTH NIGHTLY AT BEDTIME AS NEEDED FOR MUSCLE SPASM 90 tablet 3  . fluorouracil (EFUDEX) 5 % cream APPLY TO AFFECTED AREA ONCE DAILY FOR 3 TO 4 WEEKS for pre-cancerous  0  . HYDROcodone-acetaminophen (NORCO/VICODIN) 5-325 MG per tablet Take 1 tablet by mouth every 4 (four) hours as needed (for dental procedure/ pt unsure of tablets).     Marland Kitchen HYDROcodone-homatropine (HYCODAN) 5-1.5 MG/5ML syrup 1/2-1 teaspoon at  bedtime when necessary for cough 120 mL 0  . lisinopril-hydrochlorothiazide (PRINZIDE,ZESTORETIC) 20-12.5 MG tablet Take 0.5 tablets by mouth daily. 100 tablet 3  . metoprolol tartrate (LOPRESSOR) 25 MG tablet Take 1 tablet (25 mg total) by mouth 2 (two) times daily. 180 tablet 3  . Multiple Vitamin (MULTIVITAMIN) capsule Take 1 capsule by mouth daily.      . nitroGLYCERIN (NITROSTAT) 0.4 MG SL tablet Place 1 tablet (0.4 mg total) under the tongue every 5 (five) minutes as needed. 25 tablet 1  . predniSONE (DELTASONE) 20 MG tablet 2 tabs x 3 days, 1 tab x 3 days, 1/2 tab x 3 days, 1/2 tab M,W,F x 2 weeks 40 tablet 1  . sildenafil (VIAGRA) 100 MG tablet Take 1 tablet (100 mg total) by mouth daily as needed. 10 tablet 11   No current facility-administered medications on  file prior to visit.    BP 128/72 mmHg  Temp(Src) 98.1 F (36.7 C) (Oral)  Ht 5\' 10"  (1.778 m)  Wt 234 lb 8 oz (106.369 kg)  BMI 33.65 kg/m2       Objective:   Physical Exam  Constitutional: He is oriented to person, place, and time. He appears well-developed and well-nourished. No distress.  Cardiovascular: Normal rate, regular rhythm, normal heart sounds and intact distal pulses.  Exam reveals no gallop and no friction rub.   No murmur heard. Pulmonary/Chest: Effort normal and breath sounds normal. No respiratory distress. He has no wheezes. He has no rales. He exhibits no tenderness.  Neurological: He is oriented to person, place, and time.  Skin: Skin is warm and dry. No rash noted. He is not diaphoretic. No erythema. No pallor.  Psychiatric: He has a normal mood and affect. His behavior is normal. Judgment normal.  Nursing note and vitals reviewed.     Assessment & Plan:  1. Depression PHQ 9 score of 13 - Will start on Wellbutrin and told to follow up with psychiatry - buPROPion (WELLBUTRIN SR) 150 MG 12 hr tablet; Take 1 tablet (150 mg total) by mouth 2 (two) times daily.  Dispense: 60 tablet; Refill: 3 - Follow up with me in 3-4 weeks - If he develops any suicidal thoughts then please stop medication and go to the ER  Dorothyann Peng, NP

## 2016-05-21 NOTE — Patient Instructions (Signed)
It was great seeing you today   I have sent in a prescription for Wellbutrin 150mg , take this twice a day   Follow up in 3-4 weeks to see how you are doing   If you need anything, please let me know

## 2016-06-02 ENCOUNTER — Encounter: Payer: Self-pay | Admitting: Adult Health

## 2016-06-18 ENCOUNTER — Ambulatory Visit (INDEPENDENT_AMBULATORY_CARE_PROVIDER_SITE_OTHER): Payer: Medicare Other | Admitting: Adult Health

## 2016-06-18 ENCOUNTER — Encounter: Payer: Self-pay | Admitting: Adult Health

## 2016-06-18 VITALS — BP 132/64 | Temp 98.7°F | Ht 70.0 in | Wt 235.4 lb

## 2016-06-18 DIAGNOSIS — I10 Essential (primary) hypertension: Secondary | ICD-10-CM

## 2016-06-18 DIAGNOSIS — F32A Depression, unspecified: Secondary | ICD-10-CM

## 2016-06-18 DIAGNOSIS — F329 Major depressive disorder, single episode, unspecified: Secondary | ICD-10-CM

## 2016-06-18 MED ORDER — LISINOPRIL-HYDROCHLOROTHIAZIDE 20-12.5 MG PO TABS: 1.0000 | ORAL_TABLET | Freq: Every day | ORAL | 3 refills | Status: DC

## 2016-06-18 NOTE — Progress Notes (Signed)
Subjective:    Patient ID: Dave Robles, male    DOB: [birth year], 70 y.o.   MRN: [id number]  HPI  70 year old male who who  has a past medical history of Adjustment disorder with depressed mood (03/04/2009); CORONARY ARTERY DISEASE (07/10/2007); ERECTILE DYSFUNCTION (07/10/2007); Extrinsic asthma, unspecified (09/10/2008); HYPERLIPIDEMIA (07/10/2007); HYPERTENSION (07/10/2007); Pneumonia due to Mycoplasma pneumoniae (09/10/2008); and TOBACCO USE (12/15/2009).  He presents to the office today for one month follow up after starting Wellbutrin for depression. During the last visit he endorsed that he had lost interest in doing things that he once enjoyed and his wife was being hard on him for gaining weight.   Today in the office he reports that " I am feeling a lot better." He is back to riding his bike multiple times per day and feels like he is enjoying life again. His only issue is that he feels like he cannot fall asleep at night as easily.   He has not followed up with psychiatry yet   He needs a refill of hypertension medication    Review of Systems  Constitutional: Negative.   Respiratory: Negative.   Cardiovascular: Negative.   Gastrointestinal: Negative.   Skin: Negative.   Psychiatric/Behavioral: Positive for sleep disturbance. Negative for behavioral problems, confusion, decreased concentration, self-injury and suicidal ideas. The patient is nervous/anxious.   All other systems reviewed and are negative.  Past Medical History:  Diagnosis Date  . Adjustment disorder with depressed mood 03/04/2009  . CORONARY ARTERY DISEASE 07/10/2007  . ERECTILE DYSFUNCTION 07/10/2007  . Extrinsic asthma, unspecified 09/10/2008  . HYPERLIPIDEMIA 07/10/2007  . HYPERTENSION 07/10/2007  . Pneumonia due to Mycoplasma pneumoniae 09/10/2008  . TOBACCO USE 12/15/2009    Social History   Social History  . Marital status: Married    Spouse name: N/A  . Number of children: N/A  . Years of  education: N/A   Occupational History  . Not on file.   Social History Main Topics  . Smoking status: Former Smoker    Packs/day: 0.50    Quit date: 01/13/2010  . Smokeless tobacco: Never Used  . Alcohol use 3.0 oz/week    5 Cans of beer per week  . Drug use: No  . Sexual activity: Yes     Comment: regular exercise - yes   Other Topics Concern  . Not on file   Social History Narrative  . No narrative on file    Past Surgical History:  Procedure Laterality Date  . COLONOSCOPY    . CORONARY ARTERY BYPASS GRAFT  11/15/89   5 vessels  . HYDROCELE EXCISION / REPAIR  2011  . INGUINAL HERNIA REPAIR Left 1985    Family History  Problem Relation Age of Onset  . Coronary artery disease Father     cabg  . Hyperlipidemia Other   . Hypertension Other   . Colon cancer Neg Hx     Allergies  Allergen Reactions  . Other Other (See Comments)    Histinex - causes tingling    Current Outpatient Prescriptions on File Prior to Visit  Medication Sig Dispense Refill  . amoxicillin (AMOXIL) 500 MG capsule Take 500 mg by mouth 2 (two) times daily. Prior to dental procedure  0  . aspirin 81 MG tablet Take 81 mg by mouth daily.      Marland Kitchen atorvastatin (LIPITOR) 80 MG tablet TAKE 1 TABLET (80 MG TOTAL) BY MOUTH DAILY. 90 tablet 3  . buPROPion [facility] SR) 150  MG 12 hr tablet Take 1 tablet (150 mg total) by mouth 2 (two) times daily. 60 tablet 3  . clopidogrel (PLAVIX) 75 MG tablet Take 1 tablet (75 mg total) by mouth daily. 90 tablet 3  . cyclobenzaprine (FLEXERIL) 10 MG tablet TAKE ONE TABLET BY MOUTH NIGHTLY AT BEDTIME AS NEEDED FOR MUSCLE SPASM 90 tablet 3  . fluorouracil (EFUDEX) 5 % cream APPLY TO AFFECTED AREA ONCE DAILY FOR 3 TO 4 WEEKS for pre-cancerous  0  . HYDROcodone-acetaminophen (NORCO/VICODIN) 5-325 MG per tablet Take 1 tablet by mouth every 4 (four) hours as needed (for dental procedure/ pt unsure of tablets).     Marland Kitchen HYDROcodone-homatropine (HYCODAN) 5-1.5 MG/5ML syrup 1/2-1  teaspoon at bedtime when necessary for cough 120 mL 0  . lisinopril-hydrochlorothiazide (PRINZIDE,ZESTORETIC) 20-12.5 MG tablet Take 0.5 tablets by mouth daily. 100 tablet 3  . metoprolol tartrate (LOPRESSOR) 25 MG tablet Take 1 tablet (25 mg total) by mouth 2 (two) times daily. 180 tablet 3  . Multiple Vitamin (MULTIVITAMIN) capsule Take 1 capsule by mouth daily.      . nitroGLYCERIN (NITROSTAT) 0.4 MG SL tablet Place 1 tablet (0.4 mg total) under the tongue every 5 (five) minutes as needed. 25 tablet 1  . predniSONE (DELTASONE) 20 MG tablet 2 tabs x 3 days, 1 tab x 3 days, 1/2 tab x 3 days, 1/2 tab M,W,F x 2 weeks 40 tablet 1  . sildenafil (VIAGRA) 100 MG tablet Take 1 tablet (100 mg total) by mouth daily as needed. 10 tablet 11   No current facility-administered medications on file prior to visit.     BP 132/64   Temp 98.7 F (37.1 C) (Oral)   Ht 5\' 10"  (1.778 m)   Wt 235 lb 6.4 oz (106.8 kg)   BMI 33.78 kg/m       Objective:   Physical Exam  Constitutional: He is oriented to person, place, and time. He appears well-developed and well-nourished. No distress.  Cardiovascular: Normal rate, regular rhythm, normal heart sounds and intact distal pulses.  Exam reveals no gallop and no friction rub.   No murmur heard. Pulmonary/Chest: Effort normal and breath sounds normal. No respiratory distress. He has no wheezes. He has no rales. He exhibits no tenderness.  Neurological: He is alert and oriented to person, place, and time.  Skin: Skin is warm and dry. No rash noted. He is not diaphoretic. No erythema. No pallor.  Psychiatric: He has a normal mood and affect. His behavior is normal. Judgment normal.  Vitals reviewed.     Assessment & Plan:  1. Depression - Appears to bb improving  - Continue with Wellbutrin. I will have him take one pill in the morning  - Follow up if he continues to have sleep issues - Can take Melatonin if needed - Consider changing medications - Follow up  with psychiatry   2. Essential hypertension  - lisinopril-hydrochlorothiazide (PRINZIDE,ZESTORETIC) 20-12.5 MG tablet; Take 1 tablet by mouth daily.  Dispense: 90 tablet; Refill: 3   Dorothyann Peng, NP

## 2016-07-09 ENCOUNTER — Encounter: Payer: Self-pay | Admitting: Adult Health

## 2016-07-09 ENCOUNTER — Other Ambulatory Visit: Payer: Self-pay | Admitting: Adult Health

## 2016-07-09 DIAGNOSIS — I10 Essential (primary) hypertension: Secondary | ICD-10-CM

## 2016-07-09 MED ORDER — LISINOPRIL-HYDROCHLOROTHIAZIDE 20-12.5 MG PO TABS: 1.0000 | ORAL_TABLET | Freq: Every day | ORAL | 3 refills | Status: DC

## 2016-07-12 ENCOUNTER — Other Ambulatory Visit: Payer: Self-pay | Admitting: Adult Health

## 2016-07-12 DIAGNOSIS — I10 Essential (primary) hypertension: Secondary | ICD-10-CM

## 2016-07-12 MED ORDER — LISINOPRIL-HYDROCHLOROTHIAZIDE 20-12.5 MG PO TABS: 1.0000 | ORAL_TABLET | Freq: Every day | ORAL | 3 refills | Status: DC

## 2016-07-13 ENCOUNTER — Other Ambulatory Visit: Payer: Self-pay

## 2016-07-13 ENCOUNTER — Telehealth: Payer: Self-pay | Admitting: Family Medicine

## 2016-07-13 DIAGNOSIS — I10 Essential (primary) hypertension: Secondary | ICD-10-CM

## 2016-07-13 MED ORDER — LISINOPRIL-HYDROCHLOROTHIAZIDE 20-12.5 MG PO TABS: 1.0000 | ORAL_TABLET | Freq: Every day | ORAL | 3 refills | Status: DC

## 2016-07-13 NOTE — Telephone Encounter (Signed)
° °  Pt req was sent to [facility] it should be going to [facility].    lisinopril-hydrochlorothiazide (PRINZIDE,ZESTORETIC) 20-12.5 MG tablet   [phone]-

## 2016-07-13 NOTE — Telephone Encounter (Signed)
Rx resent to preferred pharmacy.  

## 2016-07-14 ENCOUNTER — Ambulatory Visit (INDEPENDENT_AMBULATORY_CARE_PROVIDER_SITE_OTHER): Payer: No Typology Code available for payment source | Admitting: Psychology

## 2016-07-14 DIAGNOSIS — F331 Major depressive disorder, recurrent, moderate: Secondary | ICD-10-CM | POA: Diagnosis not present

## 2016-08-02 ENCOUNTER — Ambulatory Visit (INDEPENDENT_AMBULATORY_CARE_PROVIDER_SITE_OTHER): Payer: No Typology Code available for payment source | Admitting: Psychology

## 2016-08-02 DIAGNOSIS — F331 Major depressive disorder, recurrent, moderate: Secondary | ICD-10-CM | POA: Diagnosis not present

## 2016-08-04 ENCOUNTER — Ambulatory Visit: Payer: Medicare Other | Admitting: Nurse Practitioner

## 2016-08-18 ENCOUNTER — Ambulatory Visit (INDEPENDENT_AMBULATORY_CARE_PROVIDER_SITE_OTHER): Payer: Medicare Other | Admitting: Psychology

## 2016-08-18 DIAGNOSIS — F331 Major depressive disorder, recurrent, moderate: Secondary | ICD-10-CM | POA: Diagnosis not present

## 2016-08-20 ENCOUNTER — Other Ambulatory Visit (INDEPENDENT_AMBULATORY_CARE_PROVIDER_SITE_OTHER): Payer: Medicare Other

## 2016-08-20 DIAGNOSIS — Z0000 Encounter for general adult medical examination without abnormal findings: Secondary | ICD-10-CM | POA: Diagnosis not present

## 2016-08-20 LAB — CBC WITH DIFFERENTIAL/PLATELET
Basophils Absolute: 0.1 10*3/uL (ref 0.0–0.1)
Basophils Relative: 1.1 % (ref 0.0–3.0)
EOS ABS: 0.2 10*3/uL (ref 0.0–0.7)
Eosinophils Relative: 3.5 % (ref 0.0–5.0)
HCT: 45 % (ref 39.0–52.0)
HEMOGLOBIN: 15.3 g/dL (ref 13.0–17.0)
LYMPHS ABS: 1.7 10*3/uL (ref 0.7–4.0)
Lymphocytes Relative: 26.2 % (ref 12.0–46.0)
MCHC: 34 g/dL (ref 30.0–36.0)
MCV: 93.5 fl (ref 78.0–100.0)
MONO ABS: 0.7 10*3/uL (ref 0.1–1.0)
Monocytes Relative: 10.3 % (ref 3.0–12.0)
NEUTROS PCT: 58.9 % (ref 43.0–77.0)
Neutro Abs: 3.9 10*3/uL (ref 1.4–7.7)
Platelets: 164 10*3/uL (ref 150.0–400.0)
RBC: 4.82 Mil/uL (ref 4.22–5.81)
RDW: 14 % (ref 11.5–15.5)
WBC: 6.6 10*3/uL (ref 4.0–10.5)

## 2016-08-20 LAB — POC URINALSYSI DIPSTICK (AUTOMATED)
BILIRUBIN UA: NEGATIVE
Glucose, UA: NEGATIVE
KETONES UA: NEGATIVE
Leukocytes, UA: NEGATIVE
Nitrite, UA: NEGATIVE
PH UA: 5.5
RBC UA: NEGATIVE
Urobilinogen, UA: 1

## 2016-08-20 LAB — LIPID PANEL
CHOL/HDL RATIO: 4
CHOLESTEROL: 114 mg/dL (ref 0–200)
HDL: 29.6 mg/dL — ABNORMAL LOW (ref >39.00)
LDL CALC: 64 mg/dL (ref 0–99)
NONHDL: 84.71
TRIGLYCERIDES: 106 mg/dL (ref 0.0–149.0)
VLDL: 21.2 mg/dL (ref 0.0–40.0)

## 2016-08-20 LAB — HEPATIC FUNCTION PANEL
ALT: 24 U/L (ref 0–53)
AST: 19 U/L (ref 0–37)
Albumin: 3.7 g/dL (ref 3.5–5.2)
Alkaline Phosphatase: 73 U/L (ref 39–117)
BILIRUBIN DIRECT: 0.2 mg/dL (ref 0.0–0.3)
BILIRUBIN TOTAL: 0.9 mg/dL (ref 0.2–1.2)
Total Protein: 5.8 g/dL — ABNORMAL LOW (ref 6.0–8.3)

## 2016-08-20 LAB — BASIC METABOLIC PANEL
BUN: 23 mg/dL (ref 6–23)
CALCIUM: 9.1 mg/dL (ref 8.4–10.5)
CO2: 29 mEq/L (ref 19–32)
CREATININE: 1.43 mg/dL (ref 0.40–1.50)
Chloride: 105 mEq/L (ref 96–112)
GFR: 52.08 mL/min — AB (ref >60.00)
GLUCOSE: 91 mg/dL (ref 70–99)
Potassium: 4 mEq/L (ref 3.5–5.1)
Sodium: 141 mEq/L (ref 135–145)

## 2016-08-20 LAB — PSA: PSA: 1.01 ng/mL (ref 0.10–4.00)

## 2016-08-20 LAB — TSH: TSH: 2.16 u[IU]/mL (ref 0.35–4.50)

## 2016-09-13 ENCOUNTER — Other Ambulatory Visit: Payer: Medicare Other

## 2016-09-20 ENCOUNTER — Encounter: Payer: Self-pay | Admitting: Family Medicine

## 2016-09-20 ENCOUNTER — Ambulatory Visit: Payer: Medicare Other | Admitting: Nurse Practitioner

## 2016-09-20 ENCOUNTER — Ambulatory Visit (INDEPENDENT_AMBULATORY_CARE_PROVIDER_SITE_OTHER): Payer: Medicare Other | Admitting: Family Medicine

## 2016-09-20 VITALS — BP 126/70 | HR 72 | Temp 98.6°F | Ht 68.5 in | Wt 230.6 lb

## 2016-09-20 DIAGNOSIS — I2510 Atherosclerotic heart disease of native coronary artery without angina pectoris: Secondary | ICD-10-CM

## 2016-09-20 DIAGNOSIS — E7800 Pure hypercholesterolemia, unspecified: Secondary | ICD-10-CM

## 2016-09-20 DIAGNOSIS — R351 Nocturia: Secondary | ICD-10-CM

## 2016-09-20 DIAGNOSIS — Z23 Encounter for immunization: Secondary | ICD-10-CM | POA: Diagnosis not present

## 2016-09-20 DIAGNOSIS — Z0001 Encounter for general adult medical examination with abnormal findings: Secondary | ICD-10-CM | POA: Diagnosis not present

## 2016-09-20 DIAGNOSIS — I10 Essential (primary) hypertension: Secondary | ICD-10-CM

## 2016-09-20 DIAGNOSIS — F329 Major depressive disorder, single episode, unspecified: Secondary | ICD-10-CM

## 2016-09-20 DIAGNOSIS — F39 Unspecified mood [affective] disorder: Secondary | ICD-10-CM

## 2016-09-20 DIAGNOSIS — F32A Depression, unspecified: Secondary | ICD-10-CM

## 2016-09-20 DIAGNOSIS — F528 Other sexual dysfunction not due to a substance or known physiological condition: Secondary | ICD-10-CM

## 2016-09-20 DIAGNOSIS — N401 Enlarged prostate with lower urinary tract symptoms: Secondary | ICD-10-CM

## 2016-09-20 DIAGNOSIS — Z0000 Encounter for general adult medical examination without abnormal findings: Secondary | ICD-10-CM

## 2016-09-20 MED ORDER — LISINOPRIL-HYDROCHLOROTHIAZIDE 20-12.5 MG PO TABS: 1.0000 | ORAL_TABLET | Freq: Every day | ORAL | 3 refills | Status: DC

## 2016-09-20 MED ORDER — ATORVASTATIN CALCIUM 80 MG PO TABS: ORAL_TABLET | ORAL | 3 refills | Status: DC

## 2016-09-20 MED ORDER — SILDENAFIL CITRATE 20 MG PO TABS: ORAL_TABLET | ORAL | 11 refills | Status: DC

## 2016-09-20 MED ORDER — HYDROCODONE-HOMATROPINE 5-1.5 MG/5ML PO SYRP: ORAL_SOLUTION | ORAL | 0 refills | Status: DC

## 2016-09-20 MED ORDER — METOPROLOL TARTRATE 25 MG PO TABS: 25.0000 mg | ORAL_TABLET | Freq: Two times a day (BID) | ORAL | 3 refills | Status: DC

## 2016-09-20 MED ORDER — CLOPIDOGREL BISULFATE 75 MG PO TABS: 75.0000 mg | ORAL_TABLET | Freq: Every day | ORAL | 3 refills | Status: DC

## 2016-09-20 MED ORDER — CYCLOBENZAPRINE HCL 10 MG PO TABS: ORAL_TABLET | ORAL | 3 refills | Status: DC

## 2016-09-20 MED ORDER — BUPROPION HCL ER (SR) 150 MG PO TB12: 150.0000 mg | ORAL_TABLET | Freq: Two times a day (BID) | ORAL | 3 refills | Status: DC

## 2016-09-20 NOTE — Patient Instructions (Signed)
Continue current medications  Follow-up in one year sooner if any problems 

## 2016-09-20 NOTE — Progress Notes (Signed)
Pre visit review using our clinic review tool, if applicable. No additional management support is needed unless otherwise documented below in the visit note. 

## 2016-09-20 NOTE — Progress Notes (Signed)
Dave Robles is a 70 year old married male nonsmoker who comes in today for general physical examination because of a history of hyperlipidemia, depression, coronary artery disease, hypertension, erectile dysfunction  He takes Lipitor 80 mg daily and an aspirin tablet because history of hyperlipidemia. HDL 29 total cholesterol 114 LDL 64 stable on medication  He takes Plavix because of a history of coronary artery disease and stents  He takes Flexeril 10 mg nightly when necessary for back pain  He takes Zestoretic 20-12 0.5 daily along with 25 mg of Lopressor twice a day because history of hypertension coronary artery disease. BP today is slightly elevated. BP at home 130/70  He sees his cardiologist on a regular basis. He's due to see Alan Mulder on Wednesday.  He uses Viagra 100 mg when necessary for ED  He gets routine eye care, dental care, colonoscopy 2015 normal  Vaccinations seasonal flu shot Pneumovax 23 given today  Review of systems he complains of bruising easily but that's from the Plavix. He also has a spot behind his right ear this been irritated and growing. He sees his dermatologist on a regular basis. At his last [facility] check this lesion was not there  He was recently started on Wellbutrin 150 mg twice a day like Cory. He feels better but he can't take that afternoon dose increased sleep at night. Advised as to take 150 mg once a day in the morning.  14 point review of systems reviewed are negative except for above  Social history he is retired he and his wife travel a lot recently went to [place] he walks daily  He's also having symptoms of BPH she has nocturia 3-4 times. Also has ED. No family history of prostate cancer  BP (!) 156/76 (BP Location: Left Arm, Patient Position: Sitting, Cuff Size: Normal)   Pulse 72   Temp 98.6 F (37 C) (Oral)   Ht 5' 8.5" (1.74 m)   Wt 230 lb 9.6 oz (104.6 kg)   SpO2 96%   BMI 34.55 kg/m  Examination HEENT were negative neck was  supple thyroid is not enlarged no carotid bruits cardiopulmonary exam normal abdominal exam normal dentition normal circumcised male rectum normal stool guaiac-negative prostate smooth 1+ nonnodular BPH. Extremities normal skin normal peripheral pulses normal...Marland KitchenMarland KitchenMarland Kitchen except for lesion between his right ear that appears to be a growing actinic keratosis  Impression #1 hypertension at goal.....Marland Kitchen continue current therapy  #2 hyperlipidemia.........Marland Kitchen LDL normal on 80 mg of Lipitor daily continue current medication  #3 coronary artery disease...... asymptomatic  #4 history of depression...... Wellbutrin 150 mg daily  #5 intermittent low back pain...Marland KitchenMarland KitchenMarland Kitchen Flexeril daily at bedtime when necessary  #6 erectile dysfunction....... trial of generic Viagra  7 abnormal lesion behind right ear....... consult with dermatologist

## 2016-09-21 ENCOUNTER — Other Ambulatory Visit: Payer: Self-pay | Admitting: Emergency Medicine

## 2016-09-21 DIAGNOSIS — F329 Major depressive disorder, single episode, unspecified: Secondary | ICD-10-CM

## 2016-09-21 DIAGNOSIS — F32A Depression, unspecified: Secondary | ICD-10-CM

## 2016-09-21 MED ORDER — BUPROPION HCL ER (SR) 150 MG PO TB12: 150.0000 mg | ORAL_TABLET | Freq: Two times a day (BID) | ORAL | 3 refills | Status: DC

## 2016-09-22 ENCOUNTER — Ambulatory Visit (INDEPENDENT_AMBULATORY_CARE_PROVIDER_SITE_OTHER): Payer: Medicare Other | Admitting: Nurse Practitioner

## 2016-09-22 ENCOUNTER — Ambulatory Visit: Payer: Medicare Other | Admitting: Psychology

## 2016-09-22 ENCOUNTER — Encounter: Payer: Self-pay | Admitting: Nurse Practitioner

## 2016-09-22 VITALS — BP 120/70 | HR 58 | Ht 69.0 in | Wt 232.4 lb

## 2016-09-22 DIAGNOSIS — E7800 Pure hypercholesterolemia, unspecified: Secondary | ICD-10-CM | POA: Diagnosis not present

## 2016-09-22 DIAGNOSIS — I10 Essential (primary) hypertension: Secondary | ICD-10-CM

## 2016-09-22 DIAGNOSIS — I259 Chronic ischemic heart disease, unspecified: Secondary | ICD-10-CM | POA: Diagnosis not present

## 2016-09-22 DIAGNOSIS — I25810 Atherosclerosis of coronary artery bypass graft(s) without angina pectoris: Secondary | ICD-10-CM

## 2016-09-22 NOTE — Progress Notes (Signed)
CARDIOLOGY OFFICE NOTE  Date:  09/22/2016    [name] Date of Birth: 06-Jan-1946 Medical Record [id number]  PCP:  Joycelyn Man, MD  Cardiologist:  Annabell Howells  Chief Complaint  Patient presents with  . Coronary Artery Disease    11 month check - seen for Dr. Aundra Dubin    History of Present Illness: JAXEL TRESTER is a 70 y.o. male who presents today for a 11 month check. Seen for Dr. Aundra Dubin.   He has HTN, HLD, CAD with past CABG in 1991, negative Myoview in 2011 with EF 67% and no ischemia, ED, tobacco abuse, asthma and depression.   Seen in April of 2014 - he had had a brief syncopal spell six weeks prior to that visit - Myoview was updated - this looked good.  Seen back in March of 2016 after being out in [place] for a long vacation the month prior. Had been exercising pretty hard - riding his bike about 15 miles per day. One day he just did not feel well. No real chest pain but was concerned and went to the ER. Told them he had CAD. EKG was ok. Troponin was +. He was cathed - noted to have NL left main, 90% prox LAD, 99% mid LAD with patent LIMA to mid LAD and patent SVG to DX. LCX origin stenosis vs flow void? Fistula vs competitive flow in branch?, 100% RCA, SVG to RCA 99% origin treated with Xience DES and PTCA to the distal. He is on Plavix. He was told that "there was something squirting" and that he possibly needs a CT scan???? Placed on Metoprolol.   His Cath CD was reviewed with Dr. 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 Burt Knack did NOT feel any treatment is necessary - this is oxygenated blood going to oxygenated blood.  I have increased his beta blocker and advised that we needed to up titrate his medicines due to his LV dysfunction. His EF was 45 to 50%.   Last seen back in January - he had gotten off track with his diet. No cardiac issues.   Comes back today. Here alone.No real  complaints -but notes he had some issues with depression - now on Wellbutrin and feeling better. Still riding his bike - tries to do about 50 miles a week. No chest pain. Not short of breath. Knows that he needs to work on his diet/weight. He is quite happy overall with how he is doing. Just had his physical with labs.   Past Medical History:  Diagnosis Date  . Adjustment disorder with depressed mood 03/04/2009  . CORONARY ARTERY DISEASE 07/10/2007  . ERECTILE DYSFUNCTION 07/10/2007  . Extrinsic asthma, unspecified 09/10/2008  . HYPERLIPIDEMIA 07/10/2007  . HYPERTENSION 07/10/2007  . Pneumonia due to Mycoplasma pneumoniae 09/10/2008  . TOBACCO USE 12/15/2009    Past Surgical History:  Procedure Laterality Date  . COLONOSCOPY    . CORONARY ARTERY BYPASS GRAFT  11/15/89   5 vessels  . HYDROCELE EXCISION / REPAIR  2011  . INGUINAL HERNIA REPAIR Left 1985     Medications: Current Outpatient Prescriptions  Medication Sig Dispense Refill  . aspirin 81 MG tablet Take 81 mg by mouth daily.      Marland Kitchen atorvastatin (LIPITOR) 80 MG tablet TAKE 1 TABLET (80 MG TOTAL) BY MOUTH DAILY. 90 tablet 3  . buPROPion (WELLBUTRIN SR) 150 MG 12 hr tablet  Take 1 tablet (150 mg total) by mouth 2 (two) times daily. 180 tablet 3  . chlorhexidine (PERIDEX) 0.12 % solution See admin instructions.  0  . clopidogrel (PLAVIX) 75 MG tablet Take 1 tablet (75 mg total) by mouth daily. 90 tablet 3  . cyclobenzaprine (FLEXERIL) 10 MG tablet TAKE ONE TABLET BY MOUTH NIGHTLY AT BEDTIME AS NEEDED FOR MUSCLE SPASM 90 tablet 3  . fluorouracil (EFUDEX) 5 % cream APPLY TO AFFECTED AREA ONCE DAILY FOR 3 TO 4 WEEKS for pre-cancerous  0  . HYDROcodone-acetaminophen (NORCO/VICODIN) 5-325 MG per tablet Take 1 tablet by mouth every 4 (four) hours as needed (for dental procedure/ pt unsure of tablets).     Marland Kitchen HYDROcodone-homatropine (HYCODAN) 5-1.5 MG/5ML syrup 1/2-1 teaspoon at bedtime when necessary for cough 120 mL 0  .  lisinopril-hydrochlorothiazide (PRINZIDE,ZESTORETIC) 20-12.5 MG tablet Take 1 tablet by mouth daily. 90 tablet 3  . metoprolol tartrate (LOPRESSOR) 25 MG tablet Take 1 tablet (25 mg total) by mouth 2 (two) times daily. 180 tablet 3  . Multiple Vitamin (MULTIVITAMIN) capsule Take 1 capsule by mouth daily.      . nitroGLYCERIN (NITROSTAT) 0.4 MG SL tablet Place 1 tablet (0.4 mg total) under the tongue every 5 (five) minutes as needed. 25 tablet 1  . sildenafil (VIAGRA) 100 MG tablet Take 1 tablet (100 mg total) by mouth daily as needed. 10 tablet 11   No current facility-administered medications for this visit.     Allergies: Allergies  Allergen Reactions  . Other Other (See Comments)    Histinex - causes tingling    Social History: The patient  reports that he quit smoking about 6 years ago. He smoked 0.50 packs per day. He has never used smokeless tobacco. He reports that he drinks about 3.0 oz of alcohol per week . He reports that he does not use drugs.   Family History: The patient's family history includes Coronary artery disease in his father; Hyperlipidemia in his other; Hypertension in his other.   Review of Systems: Please see the history of present illness.   Otherwise, the review of systems is positive for none.   All other systems are reviewed and negative.   Physical Exam: VS:  BP 120/70   Pulse (!) 58   Ht 5\' 9"  (1.753 m)   Wt 232 lb 6.4 oz (105.4 kg)   BMI 34.32 kg/m  .  BMI Body mass index is 34.32 kg/m.  Wt Readings from Last 3 Encounters:  09/22/16 232 lb 6.4 oz (105.4 kg)  09/20/16 230 lb 9.6 oz (104.6 kg)  06/18/16 235 lb 6.4 oz (106.8 kg)    General: Pleasant. Obese male who is alert and in no acute distress.   HEENT: Normal.  Neck: Supple, no JVD, carotid bruits, or masses noted.  Cardiac: Regular rate and rhythm. No murmurs, rubs, or gallops. No edema.  Respiratory:  Lungs are clear to auscultation bilaterally with normal work of breathing.  GI: Soft  and nontender.  MS: No deformity or atrophy. Gait and ROM intact.  Skin: Warm and dry. Color is normal.  Neuro:  Strength and sensation are intact and no gross focal deficits noted.  Psych: Alert, appropriate and with normal affect.   LABORATORY DATA:  EKG:  EKG is ordered today. This demonstrates NSR with inferior Q's - unchanged.  Lab Results  Component Value Date   WBC 6.6 08/20/2016   HGB 15.3 08/20/2016   HCT 45.0 08/20/2016   PLT 164.0 08/20/2016  GLUCOSE 91 08/20/2016   CHOL 114 08/20/2016   TRIG 106.0 08/20/2016   HDL 29.60 (L) 08/20/2016   LDLDIRECT 66.7 11/30/2011   LDLCALC 64 08/20/2016   ALT 24 08/20/2016   AST 19 08/20/2016   NA 141 08/20/2016   K 4.0 08/20/2016   CL 105 08/20/2016   CREATININE 1.43 08/20/2016   BUN 23 08/20/2016   CO2 29 08/20/2016   TSH 2.16 08/20/2016   PSA 1.01 08/20/2016    BNP (last 3 results) No results for input(s): BNP in the last 8760 hours.  ProBNP (last 3 results) No results for input(s): PROBNP in the last 8760 hours.   Other Studies Reviewed Today:  Echo Study Conclusions from 03/2015  - Left ventricle: The cavity size was normal. Systolic function was normal. The estimated ejection fraction was in the range of 55% to 60%. Wall motion was normal; there were no regional wall motion abnormalities. Left ventricular diastolic function parameters were normal. - Left atrium: The atrium was mildly dilated. - Atrial septum: No defect or patent foramen ovale was identified  Assessment/Plan: 1. CAD/prior NSTEMI - with PCI to SVG to RCA in March of 2017 - EF 45%. Cath film has been reviewed with Dr. Burt Knack also. No further intervention felt to be needed and will continue with medical management. He is doing well clinically. I have elected to keep him on DAPT.   2. Mild LV dysfunction - with resolution per echo from May of 2016. Would continue with current therapy.   3. HLD - on statin - recent labs noted.   4.  HTN - BP looks good. No change in his current regimen.  5. Obesity - he knows he needs to get his weight going back the other direction.    Current medicines are reviewed with the patient today.  The patient does not have concerns regarding medicines other than what has been noted above.  The following changes have been made:  See above.  Labs/ tests ordered today include:    Orders Placed This Encounter  Procedures  . EKG 12-Lead     Disposition:   FU with me in 9 months.   Patient is agreeable to this plan and will call if any problems develop in the interim.   Signed: [name], RN, ANP-C 09/22/2016 8:24 AM  [facility] [address] [place], [place]  [address] Phone: [phone] Fax: [phone]

## 2016-09-22 NOTE — Patient Instructions (Addendum)
We will be checking the following labs today - NONE  Medication Instructions:    Continue with your current medicines.     Testing/Procedures To Be Arranged:  N/A  Follow-Up:   See me in 9 months    Other Special Instructions:   N/A    If you need a refill on your cardiac medications before your next appointment, please call your pharmacy.   Call the [facility] office at [phone] if you have any questions, problems or concerns.       

## 2016-09-29 ENCOUNTER — Ambulatory Visit (INDEPENDENT_AMBULATORY_CARE_PROVIDER_SITE_OTHER): Payer: No Typology Code available for payment source | Admitting: Psychology

## 2016-09-29 DIAGNOSIS — F331 Major depressive disorder, recurrent, moderate: Secondary | ICD-10-CM

## 2016-10-13 ENCOUNTER — Encounter: Payer: Self-pay | Admitting: Nurse Practitioner

## 2016-10-14 ENCOUNTER — Telehealth: Payer: Self-pay | Admitting: Nurse Practitioner

## 2016-10-14 NOTE — Telephone Encounter (Signed)
Follow up   Pt verbalized that he is checking to see if the Email was successfully sent to [facility] and if there is any response to his email

## 2016-10-15 ENCOUNTER — Telehealth: Payer: Self-pay | Admitting: *Deleted

## 2016-10-15 ENCOUNTER — Other Ambulatory Visit: Payer: Self-pay | Admitting: *Deleted

## 2016-10-15 ENCOUNTER — Encounter: Payer: Self-pay | Admitting: *Deleted

## 2016-10-15 DIAGNOSIS — I7789 Other specified disorders of arteries and arterioles: Secondary | ICD-10-CM

## 2016-10-15 DIAGNOSIS — I77811 Abdominal aortic ectasia: Principal | ICD-10-CM

## 2016-10-15 DIAGNOSIS — R9389 Abnormal findings on diagnostic imaging of other specified body structures: Secondary | ICD-10-CM

## 2016-10-15 NOTE — Telephone Encounter (Signed)
I have spoken to Mr. Karnik this morning.   His notes from [facility] unfortunately do not mention an abnormality with his aorta.   Will try to arrange for an abdominal duplex to rule out AAA.   If insurance does not cover, he is willing to proceed with a Vascular Screening which he will have to pay out of pocket for - he is agreeable.   I will review the records myself on Monday.    [name], RN, [facility] [address] [place], [place]  [address] [phone]

## 2016-10-15 NOTE — Telephone Encounter (Signed)
Called and left patient a message that email was received and an order has been placed for the Abdominal duplex.  I have also requested a copy of the lumbar spine xray done at [facility] which showed enlarged arota.

## 2016-10-15 NOTE — Telephone Encounter (Signed)
Dave Merle, NP, called this am and stated this pt needs requested medical records from [facility]. S/w Barbette Reichmann from [facility] and will take care of this.  I wll contact pt and let pt know what is going on.

## 2016-10-15 NOTE — Telephone Encounter (Signed)
This encounter was created in error - please disregard.

## 2016-10-18 NOTE — Telephone Encounter (Signed)
Dave Robles dropped off his disc from orthopedics. I have had Dr. Cathie Olden review - he was not able to see the aorta. I received the office note from Dr. Theda Sers who noted an incidental finding of AAA.   Have already arranged for the abdominal duplex - if not covered - will arrange for the vascular screen.  [name], RN, [facility] [address] [place], [place]  [address] [phone]

## 2016-10-20 NOTE — Telephone Encounter (Signed)
Patient was advised to wait for appointment unless he develops symptoms. I explained that [name] and Dr. Acie Fredrickson both reviewed it and if they felt like it was urgent they would have sent him to the hospital. Patient denies any symptoms and is in agreement with this plan. Patient was grateful for the phone call.

## 2016-10-20 NOTE — Telephone Encounter (Signed)
Follow Up:    Pt wants to know if there is another place that he may can have his ultrasound sooner please.

## 2016-10-25 ENCOUNTER — Telehealth: Payer: Self-pay | Admitting: *Deleted

## 2016-10-25 ENCOUNTER — Ambulatory Visit (HOSPITAL_COMMUNITY)
Admission: RE | Admit: 2016-10-25 | Discharge: 2016-10-25 | Disposition: A | Discharge status: Home / Self Care | Payer: Medicare Other | Source: Ambulatory Visit | Attending: Cardiology | Admitting: Cardiology

## 2016-10-25 DIAGNOSIS — I714 Abdominal aortic aneurysm, without rupture: Secondary | ICD-10-CM | POA: Diagnosis not present

## 2016-10-25 DIAGNOSIS — I77811 Abdominal aortic ectasia: Secondary | ICD-10-CM | POA: Diagnosis present

## 2016-10-25 DIAGNOSIS — I700 Atherosclerosis of aorta: Secondary | ICD-10-CM

## 2016-10-25 DIAGNOSIS — R9389 Abnormal findings on diagnostic imaging of other specified body structures: Secondary | ICD-10-CM

## 2016-10-25 DIAGNOSIS — I7789 Other specified disorders of arteries and arterioles: Secondary | ICD-10-CM

## 2016-10-25 NOTE — Telephone Encounter (Signed)
Pt had dropped off CD of spine xray was reviewed by Dr. Cathie Olden and [name], NP, s/w pt to see if pt wanted disc shredded.  Stated could not use disc anyway in shredder.

## 2016-11-02 ENCOUNTER — Other Ambulatory Visit (HOSPITAL_COMMUNITY): Payer: Self-pay

## 2016-11-25 ENCOUNTER — Other Ambulatory Visit: Payer: Self-pay | Admitting: Family Medicine

## 2016-11-25 DIAGNOSIS — E7800 Pure hypercholesterolemia, unspecified: Secondary | ICD-10-CM

## 2016-11-25 MED ORDER — ATORVASTATIN CALCIUM 80 MG PO TABS: ORAL_TABLET | ORAL | 3 refills | Status: DC

## 2016-12-06 ENCOUNTER — Other Ambulatory Visit: Payer: Self-pay | Admitting: Emergency Medicine

## 2016-12-06 DIAGNOSIS — E7800 Pure hypercholesterolemia, unspecified: Secondary | ICD-10-CM

## 2016-12-06 MED ORDER — ATORVASTATIN CALCIUM 80 MG PO TABS: ORAL_TABLET | ORAL | 3 refills | Status: DC

## 2016-12-06 NOTE — Telephone Encounter (Signed)
Sent refill in for Lipitor.

## 2016-12-21 ENCOUNTER — Encounter: Payer: Self-pay | Admitting: Family Medicine

## 2016-12-28 ENCOUNTER — Telehealth: Payer: Self-pay | Admitting: Emergency Medicine

## 2016-12-28 NOTE — Telephone Encounter (Signed)
Spoke with pt and gave him Dr. Sherren Mocha recommendations to f/u with Cardiologist. Per Dr. Sherren Mocha he saw nothing significant within the report, pt doesn't need operation and just to follow up in one year.

## 2017-01-19 ENCOUNTER — Encounter: Payer: Self-pay | Admitting: Family Medicine

## 2017-01-19 ENCOUNTER — Ambulatory Visit (INDEPENDENT_AMBULATORY_CARE_PROVIDER_SITE_OTHER): Payer: Medicare Other | Admitting: Family Medicine

## 2017-01-19 VITALS — BP 132/72 | Temp 98.6°F | Ht 69.0 in | Wt 243.0 lb

## 2017-01-19 DIAGNOSIS — M25562 Pain in left knee: Secondary | ICD-10-CM

## 2017-01-19 DIAGNOSIS — I714 Abdominal aortic aneurysm, without rupture, unspecified: Secondary | ICD-10-CM

## 2017-01-19 HISTORY — DX: Abdominal aortic aneurysm, without rupture: I71.4

## 2017-01-19 HISTORY — DX: Pain in left knee: M25.562

## 2017-01-19 HISTORY — DX: Abdominal aortic aneurysm, without rupture, unspecified: I71.40

## 2017-01-19 NOTE — Patient Instructions (Signed)
Motrin 400 mg twice daily with food for your knee pain  Elevation and ice after exercise  Consider calling orthopedist if your symptoms persist.  Annual ultrasound to evaluate the AAA

## 2017-01-19 NOTE — Progress Notes (Signed)
Pre visit review using our clinic review tool, if applicable. No additional management support is needed unless otherwise documented below in the visit note. 

## 2017-01-19 NOTE — Progress Notes (Signed)
Dave Robles is a 71 year old married male nonsmoker who comes in today for evaluation of 2 issues  He had a cardiovascular workup in 10/25/2016 had an ultrasound of the aorta. It showed a 3.2 cm x 3.1 cm x 6.1 cm mid abdominal AAA. He saw the cardiovascular folks explained to him that since he was asymptomatic and this was a small aneurysm that nothing need to be done about it. However he wants to come in today and talk about it.  I reviewed all the data and explained to him what the aneurysm was follow-up etc. That seemed to increase his comfort zone.  He's had torn cartilage in his left knee 5 years ago and had the cartilage removed. Since that time he's been biking 3-4 days per week. Recently used a friend's bike he thought maybe it was an adjusted right couple days after the bike ride his knee was sore and stiff. Now it doesn't hurt when he walks a separate stiff when he sits for long periods of time.  BP 132/72 (BP Location: Left Arm, Patient Position: Sitting, Cuff Size: Large)   Temp 98.6 F (37 C) (Oral)   Ht 5\' 9"  (1.753 m)   Wt 243 lb (110.2 kg)   BMI 35.88 kg/m  Examination left knee shows the left need to be slightly enlarged and warm. Ligaments intact.  #1 AAA,,,,,,,,,,, reassured,,,,,,, annual ultrasound  #2 gender joint disease left knee,,,,,,,,, elevation ice,,,,, Motrin 400 twice a day with food ,,,,,,, or throat consult to consider injections ,,,,,,,,,

## 2017-06-15 ENCOUNTER — Encounter: Payer: Self-pay | Admitting: Adult Health

## 2017-06-15 ENCOUNTER — Ambulatory Visit (INDEPENDENT_AMBULATORY_CARE_PROVIDER_SITE_OTHER): Payer: Medicare Other | Admitting: Adult Health

## 2017-06-15 VITALS — BP 130/66 | Temp 98.6°F | Wt 250.0 lb

## 2017-06-15 DIAGNOSIS — B356 Tinea cruris: Secondary | ICD-10-CM | POA: Diagnosis not present

## 2017-06-15 NOTE — Progress Notes (Signed)
Subjective:    Patient ID: Dave Robles, male    DOB: 07/30/1946, 71 y.o.   MRN: [id number]  HPI  71 year old male who  has a past medical history of Adjustment disorder with depressed mood (03/04/2009); CORONARY ARTERY DISEASE (07/10/2007); ERECTILE DYSFUNCTION (07/10/2007); Extrinsic asthma, unspecified (09/10/2008); HYPERLIPIDEMIA (07/10/2007); HYPERTENSION (07/10/2007); Pneumonia due to Mycoplasma pneumoniae (09/10/2008); and TOBACCO USE (12/15/2009).   He presents to the office today for possible fungal infection on his right buttock. He reports that he was treated with PO Lamisil about 4 months ago and he had good results. Unfortunately the fungal infection has returned. He has been using a cream that he received from dermatology in past but has not noticed results. That cream is Kenalog   He reports itching but no pain or discharge    Review of Systems See HPI   Past Medical History:  Diagnosis Date  . Adjustment disorder with depressed mood 03/04/2009  . CORONARY ARTERY DISEASE 07/10/2007  . ERECTILE DYSFUNCTION 07/10/2007  . Extrinsic asthma, unspecified 09/10/2008  . HYPERLIPIDEMIA 07/10/2007  . HYPERTENSION 07/10/2007  . Pneumonia due to Mycoplasma pneumoniae 09/10/2008  . TOBACCO USE 12/15/2009    Social History   Social History  . Marital status: Married    Spouse name: N/A  . Number of children: N/A  . Years of education: N/A   Occupational History  . Not on file.   Social History Main Topics  . Smoking status: Former Smoker    Packs/day: 0.50    Quit date: 01/13/2010  . Smokeless tobacco: Never Used  . Alcohol use 3.0 oz/week    5 Cans of beer per week  . Drug use: No  . Sexual activity: Yes     Comment: regular exercise - yes   Other Topics Concern  . Not on file   Social History Narrative  . No narrative on file    Past Surgical History:  Procedure Laterality Date  . COLONOSCOPY    . CORONARY ARTERY BYPASS GRAFT  11/15/89   5 vessels  . HYDROCELE  EXCISION / REPAIR  2011  . INGUINAL HERNIA REPAIR Left 1985    Family History  Problem Relation Age of Onset  . Coronary artery disease Father        cabg  . Hyperlipidemia Other   . Hypertension Other   . Colon cancer Neg Hx     Allergies  Allergen Reactions  . Other Other (See Comments)    Histinex - causes tingling    Current Outpatient Prescriptions on File Prior to Visit  Medication Sig Dispense Refill  . aspirin 81 MG tablet Take 81 mg by mouth daily.      Marland Kitchen atorvastatin (LIPITOR) 80 MG tablet TAKE 1 TABLET (80 MG TOTAL) BY MOUTH DAILY. 90 tablet 3  . buPROPion (WELLBUTRIN SR) 150 MG 12 hr tablet Take 1 tablet (150 mg total) by mouth 2 (two) times daily. 180 tablet 3  . clopidogrel (PLAVIX) 75 MG tablet Take 1 tablet (75 mg total) by mouth daily. 90 tablet 3  . cyclobenzaprine (FLEXERIL) 10 MG tablet TAKE ONE TABLET BY MOUTH NIGHTLY AT BEDTIME AS NEEDED FOR MUSCLE SPASM 90 tablet 3  . HYDROcodone-acetaminophen (NORCO/VICODIN) 5-325 MG per tablet Take 1 tablet by mouth every 4 (four) hours as needed (for dental procedure/ pt unsure of tablets).     Marland Kitchen lisinopril-hydrochlorothiazide (PRINZIDE,ZESTORETIC) 20-12.5 MG tablet Take 1 tablet by mouth daily. 90 tablet 3  . metoprolol tartrate (LOPRESSOR)  25 MG tablet Take 1 tablet (25 mg total) by mouth 2 (two) times daily. 180 tablet 3  . Multiple Vitamin (MULTIVITAMIN) capsule Take 1 capsule by mouth daily.      . nitroGLYCERIN (NITROSTAT) 0.4 MG SL tablet Place 1 tablet (0.4 mg total) under the tongue every 5 (five) minutes as needed. 25 tablet 1  . sildenafil (VIAGRA) 100 MG tablet Take 1 tablet (100 mg total) by mouth daily as needed. 10 tablet 11  . chlorhexidine (PERIDEX) 0.12 % solution See admin instructions.  0  . HYDROcodone-homatropine (HYCODAN) 5-1.5 MG/5ML syrup 1/2-1 teaspoon at bedtime when necessary for cough (Patient not taking: Reported on 01/19/2017) 120 mL 0   No current facility-administered medications on file  prior to visit.     BP 130/66 (BP Location: Left Arm)   Temp 98.6 F (37 C) (Oral)   Wt 250 lb (113.4 kg)   BMI 36.92 kg/m       Objective:   Physical Exam  Constitutional: He is oriented to person, place, and time. He appears well-developed and well-nourished. No distress.  Cardiovascular: Normal rate, regular rhythm, normal heart sounds and intact distal pulses.  Exam reveals no gallop and no friction rub.   No murmur heard. Pulmonary/Chest: Effort normal and breath sounds normal. No respiratory distress. He has no wheezes. He has no rales. He exhibits no tenderness.  Neurological: He is alert and oriented to person, place, and time.  Skin: Skin is warm and dry. Rash (red scaley raised circular rash on right buttock. ) noted. He is not diaphoretic. No erythema. No pallor.  Psychiatric: He has a normal mood and affect. His behavior is normal. Judgment and thought content normal.  Nursing note and vitals reviewed.     Assessment & Plan:  1. Tinea cruris - Advised OTC lamisil cream instead of PO Lamisil. - Follow up if not resolved in 2 weeks or sooner if rash spreads   Dorothyann Peng, NP

## 2017-06-22 ENCOUNTER — Ambulatory Visit: Payer: Medicare Other | Admitting: Nurse Practitioner

## 2017-06-22 ENCOUNTER — Ambulatory Visit: Payer: Self-pay | Admitting: Nurse Practitioner

## 2017-07-13 ENCOUNTER — Encounter: Payer: Self-pay | Admitting: Adult Health

## 2017-07-13 ENCOUNTER — Ambulatory Visit (INDEPENDENT_AMBULATORY_CARE_PROVIDER_SITE_OTHER): Payer: Medicare Other | Admitting: Adult Health

## 2017-07-13 ENCOUNTER — Other Ambulatory Visit: Payer: Self-pay | Admitting: Adult Health

## 2017-07-13 VITALS — BP 120/64 | Temp 98.7°F | Ht 69.0 in | Wt 243.0 lb

## 2017-07-13 DIAGNOSIS — I10 Essential (primary) hypertension: Secondary | ICD-10-CM | POA: Diagnosis not present

## 2017-07-13 DIAGNOSIS — F339 Major depressive disorder, recurrent, unspecified: Secondary | ICD-10-CM

## 2017-07-13 DIAGNOSIS — Z23 Encounter for immunization: Secondary | ICD-10-CM

## 2017-07-13 DIAGNOSIS — B49 Unspecified mycosis: Secondary | ICD-10-CM

## 2017-07-13 DIAGNOSIS — Z7689 Persons encountering health services in other specified circumstances: Secondary | ICD-10-CM

## 2017-07-13 LAB — HEPATIC FUNCTION PANEL
ALK PHOS: 71 U/L (ref 39–117)
ALT: 22 U/L (ref 0–53)
AST: 19 U/L (ref 0–37)
Albumin: 4.1 g/dL (ref 3.5–5.2)
BILIRUBIN DIRECT: 0.2 mg/dL (ref 0.0–0.3)
BILIRUBIN TOTAL: 1 mg/dL (ref 0.2–1.2)
Total Protein: 6.3 g/dL (ref 6.0–8.3)

## 2017-07-13 MED ORDER — TERBINAFINE HCL 250 MG PO TABS: 250.0000 mg | ORAL_TABLET | Freq: Every day | ORAL | 3 refills | Status: DC

## 2017-07-13 NOTE — Progress Notes (Signed)
Patient presents to clinic today to establish care. He is a pleasant 71 year old male who  has a past medical history of Adjustment disorder with depressed mood (03/04/2009); CORONARY ARTERY DISEASE (07/10/2007); ERECTILE DYSFUNCTION (07/10/2007); Extrinsic asthma, unspecified (09/10/2008); HYPERLIPIDEMIA (07/10/2007); HYPERTENSION (07/10/2007); Pneumonia due to Mycoplasma pneumoniae (09/10/2008); and TOBACCO USE (12/15/2009).  His last CPE was in October 2017   Acute Concerns: Establish Care   Chronic Issues: Depression - He is currently taking Wellbutrin 150 mg daily. He has tried taking 300 mg in the past but was unable to sleep. He feels as though he is about 80% while on medication. He is also seeing Dr. Cheryln Manly   Fungal infection on left buttock - I saw him for this about 1 month ago and he was prescribe an anti fungal cream. He feels as though it is not working well.   AAA - relatively new finding. Is being followed by Cardiology with yearly [place].   Hypertension - Well controlled with current therapy of Prinizide 20-12.5 and metoprolol 25 mg BID  Health Maintenance: Dental -- Routine Care  Vision -- Routine Care  Immunizations -- UTD  Colonoscopy -- up to date until 2022  He is followed by:  . Cardiology - Twice a year  - Psychiatry - He sees Dr. Cheryln Manly     Past Medical History:  Diagnosis Date  . Adjustment disorder with depressed mood 03/04/2009  . CORONARY ARTERY DISEASE 07/10/2007  . ERECTILE DYSFUNCTION 07/10/2007  . Extrinsic asthma, unspecified 09/10/2008  . HYPERLIPIDEMIA 07/10/2007  . HYPERTENSION 07/10/2007  . Pneumonia due to Mycoplasma pneumoniae 09/10/2008  . TOBACCO USE 12/15/2009    Past Surgical History:  Procedure Laterality Date  . COLONOSCOPY    . CORONARY ARTERY BYPASS GRAFT  11/15/89   5 vessels  . HYDROCELE EXCISION / REPAIR  2011  . INGUINAL HERNIA REPAIR Left 1985    Current Outpatient Prescriptions on File Prior to Visit  Medication Sig  Dispense Refill  . aspirin 81 MG tablet Take 81 mg by mouth daily.      Marland Kitchen atorvastatin (LIPITOR) 80 MG tablet TAKE 1 TABLET (80 MG TOTAL) BY MOUTH DAILY. 90 tablet 3  . buPROPion (WELLBUTRIN SR) 150 MG 12 hr tablet Take 1 tablet (150 mg total) by mouth 2 (two) times daily. 180 tablet 3  . chlorhexidine (PERIDEX) 0.12 % solution See admin instructions.  0  . clopidogrel (PLAVIX) 75 MG tablet Take 1 tablet (75 mg total) by mouth daily. 90 tablet 3  . cyclobenzaprine (FLEXERIL) 10 MG tablet TAKE ONE TABLET BY MOUTH NIGHTLY AT BEDTIME AS NEEDED FOR MUSCLE SPASM 90 tablet 3  . ketoconazole (NIZORAL) 2 % cream APPLY TO AFFECTED AREA TWICE A DAY  0  . lisinopril-hydrochlorothiazide (PRINZIDE,ZESTORETIC) 20-12.5 MG tablet Take 1 tablet by mouth daily. 90 tablet 3  . metoprolol tartrate (LOPRESSOR) 25 MG tablet Take 1 tablet (25 mg total) by mouth 2 (two) times daily. 180 tablet 3  . Multiple Vitamin (MULTIVITAMIN) capsule Take 1 capsule by mouth daily.      . nitroGLYCERIN (NITROSTAT) 0.4 MG SL tablet Place 1 tablet (0.4 mg total) under the tongue every 5 (five) minutes as needed. 25 tablet 1  . sildenafil (VIAGRA) 100 MG tablet Take 1 tablet (100 mg total) by mouth daily as needed. 10 tablet 11  . terbinafine (LAMISIL) 250 MG tablet Take 250 mg by mouth daily.  0  . HYDROcodone-acetaminophen (NORCO/VICODIN) 5-325 MG per tablet Take 1 tablet by  mouth every 4 (four) hours as needed (for dental procedure/ pt unsure of tablets).     Marland Kitchen HYDROcodone-homatropine (HYCODAN) 5-1.5 MG/5ML syrup 1/2-1 teaspoon at bedtime when necessary for cough (Patient not taking: Reported on 01/19/2017) 120 mL 0   No current facility-administered medications on file prior to visit.     Allergies  Allergen Reactions  . Other Other (See Comments)    Histinex - causes tingling    Family History  Problem Relation Age of Onset  . Coronary artery disease Father        cabg  . Hyperlipidemia Other   . Hypertension Other   .  Colon cancer Neg Hx     Social History   Social History  . Marital status: Married    Spouse name: N/A  . Number of children: N/A  . Years of education: N/A   Occupational History  . Not on file.   Social History Main Topics  . Smoking status: Former Smoker    Packs/day: 0.50    Quit date: 01/13/2010  . Smokeless tobacco: Never Used  . Alcohol use 3.0 oz/week    5 Cans of beer per week  . Drug use: No  . Sexual activity: Yes     Comment: regular exercise - yes   Other Topics Concern  . Not on file   Social History Narrative  . No narrative on file    Review of Systems  Constitutional: Negative.   HENT: Negative.   Respiratory: Negative.   Cardiovascular: Negative.   Gastrointestinal: Negative.   Genitourinary: Negative.   Musculoskeletal: Negative.   Neurological: Negative.   Psychiatric/Behavioral: Positive for depression.  All other systems reviewed and are negative.   BP 120/64 (BP Location: Left Arm)   Temp 98.7 F (37.1 C) (Oral)   Ht 5\' 9"  (1.753 m)   Wt 243 lb (110.2 kg)   BMI 35.88 kg/m   Physical Exam  Constitutional: He is oriented to person, place, and time and well-developed, well-nourished, and in no distress. No distress.  Obese   Cardiovascular: Normal rate, regular rhythm, normal heart sounds and intact distal pulses.  Exam reveals no gallop and no friction rub.   No murmur heard. Pulmonary/Chest: Effort normal and breath sounds normal. No respiratory distress. He has no wheezes. He has no rales. He exhibits no tenderness.  Neurological: He is alert and oriented to person, place, and time. Gait normal. GCS score is 15.  Skin: Skin is warm and dry. No rash noted. He is not diaphoretic. No erythema. No pallor.  Psychiatric: Mood, memory, affect and judgment normal.  Nursing note and vitals reviewed.  Assessment/Plan: 1. Encounter to establish care - Follow up for CPE or sooner if needed - Needs to increase exercise  - Work on diet    2. Essential hypertension - Well controlled. No change in medications  3. Fungal infection - Hepatic function panel - If liver function is normal will add Lamisil PO  4. Need for prophylactic vaccination and inoculation against influenza  - Flu vaccine HIGH DOSE PF (Fluzone High dose)  5. Depression, recurrent ([place]) - Continue with current treatment  - List of local therapists given   Dorothyann Peng, NP

## 2017-07-13 NOTE — Patient Instructions (Signed)
It was great seeing you today   Please follow up with me in November for your physical    [facility] [address]  [place], [place] [phone]

## 2017-07-14 ENCOUNTER — Encounter: Payer: Self-pay | Admitting: Adult Health

## 2017-07-15 ENCOUNTER — Ambulatory Visit: Payer: Self-pay | Admitting: Adult Health

## 2017-07-15 MED ORDER — TERBINAFINE HCL 250 MG PO TABS: 250.0000 mg | ORAL_TABLET | Freq: Every day | ORAL | 3 refills | Status: DC

## 2017-07-20 ENCOUNTER — Ambulatory Visit (INDEPENDENT_AMBULATORY_CARE_PROVIDER_SITE_OTHER): Payer: Medicare Other | Admitting: Nurse Practitioner

## 2017-07-20 ENCOUNTER — Encounter: Payer: Self-pay | Admitting: Nurse Practitioner

## 2017-07-20 VITALS — BP 130/76 | HR 58 | Ht 70.0 in | Wt 244.1 lb

## 2017-07-20 DIAGNOSIS — I10 Essential (primary) hypertension: Secondary | ICD-10-CM

## 2017-07-20 DIAGNOSIS — E7800 Pure hypercholesterolemia, unspecified: Secondary | ICD-10-CM | POA: Diagnosis not present

## 2017-07-20 DIAGNOSIS — I259 Chronic ischemic heart disease, unspecified: Secondary | ICD-10-CM | POA: Diagnosis not present

## 2017-07-20 DIAGNOSIS — I714 Abdominal aortic aneurysm, without rupture, unspecified: Secondary | ICD-10-CM

## 2017-07-20 DIAGNOSIS — I25810 Atherosclerosis of coronary artery bypass graft(s) without angina pectoris: Secondary | ICD-10-CM

## 2017-07-20 LAB — CBC
Hematocrit: 45 % (ref 37.5–51.0)
Hemoglobin: 16 g/dL (ref 13.0–17.7)
MCH: 32.5 pg (ref 26.6–33.0)
MCHC: 35.6 g/dL (ref 31.5–35.7)
MCV: 92 fL (ref 79–97)
Platelets: 187 10*3/uL (ref 150–379)
RBC: 4.92 x10E6/uL (ref 4.14–5.80)
RDW: 13.2 % (ref 12.3–15.4)
WBC: 8.3 10*3/uL (ref 3.4–10.8)

## 2017-07-20 LAB — BASIC METABOLIC PANEL
BUN/Creatinine Ratio: 14 (ref 10–24)
BUN: 19 mg/dL (ref 8–27)
CO2: 24 mmol/L (ref 20–29)
Calcium: 9.3 mg/dL (ref 8.6–10.2)
Chloride: 102 mmol/L (ref 96–106)
Creatinine, Ser: 1.38 mg/dL — ABNORMAL HIGH (ref 0.76–1.27)
GFR calc Af Amer: 59 mL/min/{1.73_m2} — ABNORMAL LOW (ref >59)
GFR calc non Af Amer: 51 mL/min/{1.73_m2} — ABNORMAL LOW (ref >59)
Glucose: 88 mg/dL (ref 65–99)
Potassium: 4.1 mmol/L (ref 3.5–5.2)
Sodium: 140 mmol/L (ref 134–144)

## 2017-07-20 LAB — LIPID PANEL
Chol/HDL Ratio: 3.8 ratio (ref 0.0–5.0)
Cholesterol, Total: 119 mg/dL (ref 100–199)
HDL: 31 mg/dL — ABNORMAL LOW (ref >39)
LDL Calculated: 55 mg/dL (ref 0–99)
Triglycerides: 165 mg/dL — ABNORMAL HIGH (ref 0–149)
VLDL Cholesterol Cal: 33 mg/dL (ref 5–40)

## 2017-07-20 NOTE — Progress Notes (Signed)
CARDIOLOGY OFFICE NOTE  Date:  07/20/2017    Dave Robles Date of Birth: 1946/08/23 Medical Record #[id number]  PCP:  Dorothyann Peng, NP  Cardiologist:  Servando Snare   Chief Complaint  Patient presents with  . Coronary Artery Disease  . Hypertension  . Hyperlipidemia    Follow up visit.     History of Present Illness: Dave Robles is a 71 y.o. male who presents today for a follow up visit. Former patient of Dr. Claris Gladden - typically follows with me.   He has HTN, HLD, CAD with past CABG in 1991, negative Myoview in 2011 with EF 67% and no ischemia, ED, tobacco abuse, asthma and depression. Small AAA noted on duplex study from December of 2017.   Seen in April of 2014 - he had had a brief syncopal spell six weeks prior to that visit - Myoview was updated - this looked good.  Seen back in March of 2016 after being out in [place] for a long vacation the month prior. Had been exercising pretty hard - riding his bike about 15 miles per day. One day he just did not feel well. No real chest pain but was concerned and went to the ER. Told them he had CAD. EKG was ok. Troponin was +. He was cathed - noted to have NL left main, 90% prox LAD, 99% mid LAD with patent LIMA to mid LAD and patent SVG to DX. LCX origin stenosis vs flow void? Fistula vs competitive flow in branch?, 100% RCA, SVG to RCA 99% origin treated with Xience DES and PTCA to the distal. He is on Plavix. He was told that "there was something squirting" and that he possibly needs a CT scan???? Placed on Metoprolol.   His cath CD was reviewed with Dr. 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 Burt Knack did NOT feel any treatment is necessary - this is oxygenated blood going to oxygenated blood.  I increased his beta blocker and advised that we needed to up titrate his medicines due to his LV dysfunction. His EF was 45 to 50%.   Last seen back in November - no real  complaints -but having some issues with depression - cardiac status felt to be stable. Was noted to have small AAA - needs annual follow up.   Comes in today. Here alone. Doing well. Having more issues with his legs - not able to ride the bike due to left knee pain - seeing ortho later this week. He may be considering hip replacement. But notes that both legs seem to have less strength - he is not sure if this is due to being less active or from his statin therapy. No chest pain. Breathing is good. Energy level is ok. Not dizzy. No syncope. Tolerating his medicines without issue.   Past Medical History:  Diagnosis Date  . AAA (abdominal aortic aneurysm) ([place])   . Adjustment disorder with depressed mood 03/04/2009  . Colon polyps   . CORONARY ARTERY DISEASE 07/10/2007  . Depression   . ERECTILE DYSFUNCTION 07/10/2007  . Extrinsic asthma, unspecified 09/10/2008  . HYPERLIPIDEMIA 07/10/2007  . HYPERTENSION 07/10/2007  . Pneumonia due to Mycoplasma pneumoniae 09/10/2008  . TOBACCO USE 12/15/2009    Past Surgical History:  Procedure Laterality Date  . COLONOSCOPY    . CORONARY ARTERY BYPASS GRAFT  11/15/89   5 vessels  . HYDROCELE EXCISION / REPAIR  2011  . INGUINAL HERNIA REPAIR Left 1985     Medications: Current Meds  Medication Sig  . aspirin 81 MG tablet Take 81 mg by mouth daily.    Marland Kitchen atorvastatin (LIPITOR) 80 MG tablet TAKE 1 TABLET (80 MG TOTAL) BY MOUTH DAILY.  Marland Kitchen buPROPion (WELLBUTRIN SR) 150 MG 12 hr tablet Take 1 tablet (150 mg total) by mouth 2 (two) times daily.  . chlorhexidine (PERIDEX) 0.12 % solution See admin instructions.  . clopidogrel (PLAVIX) 75 MG tablet Take 1 tablet (75 mg total) by mouth daily.  . cyclobenzaprine (FLEXERIL) 10 MG tablet TAKE ONE TABLET BY MOUTH NIGHTLY AT BEDTIME AS NEEDED FOR MUSCLE SPASM  . HYDROcodone-acetaminophen (NORCO/VICODIN) 5-325 MG per tablet Take 1 tablet by mouth every 4 (four) hours as needed (for dental procedure/ pt unsure of  tablets).   Marland Kitchen lisinopril-hydrochlorothiazide (PRINZIDE,ZESTORETIC) 20-12.5 MG tablet Take 1 tablet by mouth daily.  . metoprolol tartrate (LOPRESSOR) 25 MG tablet Take 1 tablet (25 mg total) by mouth 2 (two) times daily.  . Multiple Vitamin (MULTIVITAMIN) capsule Take 1 capsule by mouth daily.    . nitroGLYCERIN (NITROSTAT) 0.4 MG SL tablet Place 1 tablet (0.4 mg total) under the tongue every 5 (five) minutes as needed.  . sildenafil (VIAGRA) 100 MG tablet Take 1 tablet (100 mg total) by mouth daily as needed.  . terbinafine (LAMISIL) 250 MG tablet Take 1 tablet (250 mg total) by mouth daily.     Allergies: Allergies  Allergen Reactions  . Other Other (See Comments)    Histinex - causes tingling    Social History: The patient  reports that he quit smoking about 7 years ago. He smoked 0.50 packs per day. He has never used smokeless tobacco. He reports that he drinks about 3.0 oz of alcohol per week . He reports that he does not use drugs.   Family History: The patient's family history includes Cancer in his mother; Coronary artery disease in his father; Depression in his mother and paternal grandfather; Hyperlipidemia in his other; Hypertension in his other.   Review of Systems: Please see the history of present illness.   Otherwise, the review of systems is positive for none.   All other systems are reviewed and negative.   Physical Exam: VS:  BP 130/76 (BP Location: Left Arm, Patient Position: Sitting, Cuff Size: Normal)   Pulse (!) 58   Ht 5\' 10"  (1.778 m)   Wt 244 lb 1.9 oz (110.7 kg)   SpO2 96% Comment: at rest  BMI 35.03 kg/m  .  BMI Body mass index is 35.03 kg/m.  Wt Readings from Last 3 Encounters:  07/20/17 244 lb 1.9 oz (110.7 kg)  07/13/17 243 lb (110.2 kg)  06/15/17 250 lb (113.4 kg)    General: Pleasant. Obese male who is alert and in no acute distress.   HEENT: Normal.  Neck: Supple, no JVD, carotid bruits, or masses noted.  Cardiac: Regular rate and rhythm.  No murmurs, rubs, or gallops. No edema.  Respiratory:  Lungs are clear to auscultation bilaterally with normal work of breathing.  GI: Soft and nontender. Obese.  MS: No deformity or atrophy. Gait and ROM intact.  Skin: Warm and dry. Color is normal.  Neuro:  Strength and sensation are intact and no gross focal deficits noted.  Psych: Alert, appropriate and with normal affect.   LABORATORY DATA:  EKG:  EKG is not ordered today.  Lab Results  Component Value Date   WBC 6.6 08/20/2016  HGB 15.3 08/20/2016   HCT 45.0 08/20/2016   PLT 164.0 08/20/2016   GLUCOSE 91 08/20/2016   CHOL 114 08/20/2016   TRIG 106.0 08/20/2016   HDL 29.60 (L) 08/20/2016   LDLDIRECT 66.7 11/30/2011   LDLCALC 64 08/20/2016   ALT 22 07/13/2017   AST 19 07/13/2017   NA 141 08/20/2016   K 4.0 08/20/2016   CL 105 08/20/2016   CREATININE 1.43 08/20/2016   BUN 23 08/20/2016   CO2 29 08/20/2016   TSH 2.16 08/20/2016   PSA 1.01 08/20/2016     BNP (last 3 results) No results for input(s): BNP in the last 8760 hours.  ProBNP (last 3 results) No results for input(s): PROBNP in the last 8760 hours.   Other Studies Reviewed Today:  Echo Study Conclusions from 03/2015  - Left ventricle: The cavity size was normal. Systolic function was normal. The estimated ejection fraction was in the range of 55% to 60%. Wall motion was normal; there were no regional wall motion abnormalities. Left ventricular diastolic function parameters were normal. - Left atrium: The atrium was mildly dilated. - Atrial septum: No defect or patent foramen ovale was identified  Assessment/Plan: 1. CAD/prior NSTEMI - with PCI to SVG to RCA in March of 2016 - EF 45%. Cath film was reviewed with Dr. Burt Knack previously. No further intervention felt to be needed and will continue with medical management. He continues to do well clinically. I have elected to keep him on DAPT. Lab today.   2. Mild LV dysfunction - with  resolution per echo from May of 2016. Would continue with current therapy. No symptoms noted.   3. HLD - on statin - needs labs today - complaints of decreased strength in the legs - will give him a drug holiday for the next 2 weeks - if he improves - he will let us know and we can cut the dose back or just switch - if he does not improve - he will resume his current dose. He has had recent LFTs noted.   4. HTN - BP looks good. No change in his current regimen.  5. Obesity - he knows he needs to get his weight going back the other direction. Hopefully once his orthopedic issues are resolved, he can get back on track. Should be ok candidate for hip surgery if needed.   6. AAA - will need follow up study in December of 2018.   Current medicines are reviewed with the patient today.  The patient does not have concerns regarding medicines other than what has been noted above.  The following changes have been made:  See above.  Labs/ tests ordered today include:    Orders Placed This Encounter  Procedures  . Basic metabolic panel  . Lipid panel  . CBC     Disposition:   FU with me in 6 months with fasting labs.   Patient is agreeable to this plan and will call if any problems develop in the interim.   SignedTruitt Merle, NP  07/20/2017 8:46 AM  [facility] [address] [place], [place]  [address] Phone: [phone] Fax: [phone]

## 2017-07-20 NOTE — Patient Instructions (Addendum)
We will be checking the following labs today - BMET, CBC, lipids   Medication Instructions:    Continue with your current medicines.   Ok to stop your Lipitor for the next 2 weeks - if your legs feel better - let me know - we can change your medicine/lower the dose - if your legs do not feel any better - just restart the Lipitor    Testing/Procedures To Be Arranged:  AAA duplex after December 11th, 2018  Follow-Up:   See me in 6 months with EKG    Other Special Instructions:   N/A    If you need a refill on your cardiac medications before your next appointment, please call your pharmacy.   Call the [place] office at [phone] if you have any questions, problems or concerns.

## 2017-07-21 ENCOUNTER — Encounter: Payer: Self-pay | Admitting: Adult Health

## 2017-07-21 DIAGNOSIS — E7800 Pure hypercholesterolemia, unspecified: Secondary | ICD-10-CM

## 2017-07-21 DIAGNOSIS — I10 Essential (primary) hypertension: Secondary | ICD-10-CM

## 2017-07-21 MED ORDER — ATORVASTATIN CALCIUM 80 MG PO TABS: ORAL_TABLET | ORAL | 3 refills | Status: DC

## 2017-07-22 MED ORDER — LISINOPRIL-HYDROCHLOROTHIAZIDE 20-12.5 MG PO TABS: 1.0000 | ORAL_TABLET | Freq: Every day | ORAL | 3 refills | Status: DC

## 2017-07-22 NOTE — Addendum Note (Signed)
Addended by: Miles Costain T on: 07/22/2017 07:58 AM   Modules accepted: Orders

## 2017-08-05 ENCOUNTER — Encounter: Payer: Self-pay | Admitting: Adult Health

## 2017-08-09 ENCOUNTER — Encounter: Payer: Self-pay | Admitting: Nurse Practitioner

## 2017-10-04 ENCOUNTER — Encounter: Payer: Self-pay | Admitting: Adult Health

## 2017-10-04 ENCOUNTER — Ambulatory Visit (INDEPENDENT_AMBULATORY_CARE_PROVIDER_SITE_OTHER): Payer: Medicare Other | Admitting: Adult Health

## 2017-10-04 VITALS — BP 128/72 | Temp 98.4°F | Ht 69.5 in | Wt 243.0 lb

## 2017-10-04 DIAGNOSIS — E7800 Pure hypercholesterolemia, unspecified: Secondary | ICD-10-CM | POA: Diagnosis not present

## 2017-10-04 DIAGNOSIS — Z0000 Encounter for general adult medical examination without abnormal findings: Secondary | ICD-10-CM | POA: Diagnosis not present

## 2017-10-04 DIAGNOSIS — B369 Superficial mycosis, unspecified: Secondary | ICD-10-CM | POA: Diagnosis not present

## 2017-10-04 DIAGNOSIS — I10 Essential (primary) hypertension: Secondary | ICD-10-CM

## 2017-10-04 DIAGNOSIS — Z125 Encounter for screening for malignant neoplasm of prostate: Secondary | ICD-10-CM | POA: Diagnosis not present

## 2017-10-04 LAB — PSA: PSA: 0.88 ng/mL (ref 0.10–4.00)

## 2017-10-04 LAB — CBC WITH DIFFERENTIAL/PLATELET
BASOS ABS: 0.1 10*3/uL (ref 0.0–0.1)
Basophils Relative: 1.8 % (ref 0.0–3.0)
EOS ABS: 0.3 10*3/uL (ref 0.0–0.7)
Eosinophils Relative: 4.5 % (ref 0.0–5.0)
HEMATOCRIT: 47.1 % (ref 39.0–52.0)
Hemoglobin: 16.1 g/dL (ref 13.0–17.0)
LYMPHS PCT: 24.3 % (ref 12.0–46.0)
Lymphs Abs: 1.7 10*3/uL (ref 0.7–4.0)
MCHC: 34.1 g/dL (ref 30.0–36.0)
MCV: 95.1 fl (ref 78.0–100.0)
Monocytes Absolute: 0.8 10*3/uL (ref 0.1–1.0)
Monocytes Relative: 11.3 % (ref 3.0–12.0)
NEUTROS ABS: 4 10*3/uL (ref 1.4–7.7)
NEUTROS PCT: 58.1 % (ref 43.0–77.0)
PLATELETS: 162 10*3/uL (ref 150.0–400.0)
RBC: 4.96 Mil/uL (ref 4.22–5.81)
RDW: 13.7 % (ref 11.5–15.5)
WBC: 7 10*3/uL (ref 4.0–10.5)

## 2017-10-04 LAB — HEPATIC FUNCTION PANEL
ALK PHOS: 75 U/L (ref 39–117)
ALT: 22 U/L (ref 0–53)
AST: 19 U/L (ref 0–37)
Albumin: 3.9 g/dL (ref 3.5–5.2)
BILIRUBIN DIRECT: 0.1 mg/dL (ref 0.0–0.3)
Total Bilirubin: 0.7 mg/dL (ref 0.2–1.2)
Total Protein: 5.9 g/dL — ABNORMAL LOW (ref 6.0–8.3)

## 2017-10-04 LAB — BASIC METABOLIC PANEL
BUN: 15 mg/dL (ref 6–23)
CALCIUM: 9.3 mg/dL (ref 8.4–10.5)
CO2: 30 meq/L (ref 19–32)
CREATININE: 1.29 mg/dL (ref 0.40–1.50)
Chloride: 104 mEq/L (ref 96–112)
GFR: 58.47 mL/min — ABNORMAL LOW (ref >60.00)
Glucose, Bld: 75 mg/dL (ref 70–99)
Potassium: 3.8 mEq/L (ref 3.5–5.1)
Sodium: 140 mEq/L (ref 135–145)

## 2017-10-04 LAB — LIPID PANEL
CHOL/HDL RATIO: 4
Cholesterol: 129 mg/dL (ref 0–200)
HDL: 33.4 mg/dL — AB (ref >39.00)
LDL Cholesterol: 59 mg/dL (ref 0–99)
NONHDL: 95.73
TRIGLYCERIDES: 185 mg/dL — AB (ref 0.0–149.0)
VLDL: 37 mg/dL (ref 0.0–40.0)

## 2017-10-04 MED ORDER — NYSTATIN 100000 UNIT/GM EX CREA: 1.0000 "application " | TOPICAL_CREAM | Freq: Two times a day (BID) | CUTANEOUS | 0 refills | Status: DC

## 2017-10-04 NOTE — Progress Notes (Signed)
Subjective:    Patient ID: Dave Robles, male    DOB: [birth year], 71 y.o.   MRN: [id number]  HPI  Patient presents for yearly preventative medicine examination. He is a pleasant 71 year old male who  has a past medical history of AAA (abdominal aortic aneurysm) ([place]), Adjustment disorder with depressed mood (03/04/2009), Colon polyps, CORONARY ARTERY DISEASE (07/10/2007), Depression, ERECTILE DYSFUNCTION (07/10/2007), Extrinsic asthma, unspecified (09/10/2008), HYPERLIPIDEMIA (07/10/2007), HYPERTENSION (07/10/2007), Pneumonia due to Mycoplasma pneumoniae (09/10/2008), and TOBACCO USE (12/15/2009).  He takes lipitor 80 mg for hyperlipidemia   He takes lisinopril-HCTZ 20-125 mg and Metoprolol 25 mg BID for hypertension   He takes Plavix for CAD with CABG in 1991  He takes Wellbutrin 150 mg BID for depression   All immunizations and health maintenance protocols were reviewed with the patient and needed orders were placed.  Appropriate screening laboratory values were ordered for the patient including screening of hyperlipidemia, renal function and hepatic function. If indicated by BPH, a PSA was ordered.  Medication reconciliation,  past medical history, social history, problem list and allergies were reviewed in detail with the patient  Goals were established with regard to weight loss, exercise, and  diet in compliance with medications  End of life planning was discussed.  He is up to date on his colonoscopy. He participates in routine dental and vision checks and has been seen by Cardiology in September. He is going to Dermatology next month   He continues to have a fungal infection on his left buttock. He has tried OTC and prescription fungal medications that " have not made much of a difference."    Review of Systems  Constitutional: Negative.   HENT: Negative.   Eyes: Negative.   Respiratory: Negative.   Cardiovascular: Negative.   Gastrointestinal: Negative.   Endocrine:  Negative.   Genitourinary: Negative.   Musculoskeletal: Negative.   Skin: Positive for rash.  Allergic/Immunologic: Negative.   Neurological: Negative.   Hematological: Negative.   Psychiatric/Behavioral: Negative.   All other systems reviewed and are negative.    Past Medical History:  Diagnosis Date  . AAA (abdominal aortic aneurysm) ([place])   . Adjustment disorder with depressed mood 03/04/2009  . Colon polyps   . CORONARY ARTERY DISEASE 07/10/2007  . Depression   . ERECTILE DYSFUNCTION 07/10/2007  . Extrinsic asthma, unspecified 09/10/2008  . HYPERLIPIDEMIA 07/10/2007  . HYPERTENSION 07/10/2007  . Pneumonia due to Mycoplasma pneumoniae 09/10/2008  . TOBACCO USE 12/15/2009    Social History   Socioeconomic History  . Marital status: Married    Spouse name: Not on file  . Number of children: Not on file  . Years of education: Not on file  . Highest education level: Not on file  Social Needs  . Financial resource strain: Not on file  . Food insecurity - worry: Not on file  . Food insecurity - inability: Not on file  . Transportation needs - medical: Not on file  . Transportation needs - non-medical: Not on file  Occupational History  . Not on file  Tobacco Use  . Smoking status: Former Smoker    Packs/day: 0.50    Last attempt to quit: 01/13/2010    Years since quitting: 7.7  . Smokeless tobacco: Never Used  Substance and Sexual Activity  . Alcohol use: Yes    Alcohol/week: 3.0 oz    Types: 5 Cans of beer per week  . Drug use: No  . Sexual activity: Yes    Comment:  regular exercise - yes  Other Topics Concern  . Not on file  Social History Narrative   Retired from being an Financial controller of a small business    Married    No kids       He likes to play golf and rides bike        Past Surgical History:  Procedure Laterality Date  . COLONOSCOPY    . CORONARY ARTERY BYPASS GRAFT  11/15/89   5 vessels  . HYDROCELE EXCISION / REPAIR  2011  . INGUINAL HERNIA REPAIR Left  1985    Family History  Problem Relation Age of Onset  . Coronary artery disease Father        cabg  . Cancer Mother        Breast and or lung   . Depression Mother   . Hyperlipidemia Other   . Hypertension Other   . Depression Paternal Grandfather   . Colon cancer Neg Hx     Allergies  Allergen Reactions  . Other Other (See Comments)    Histinex - causes tingling    Current Outpatient Medications on File Prior to Visit  Medication Sig Dispense Refill  . aspirin 81 MG tablet Take 81 mg by mouth daily.      Marland Kitchen atorvastatin (LIPITOR) 80 MG tablet TAKE 1 TABLET (80 MG TOTAL) BY MOUTH DAILY. 90 tablet 3  . buPROPion (WELLBUTRIN SR) 150 MG 12 hr tablet Take 1 tablet (150 mg total) by mouth 2 (two) times daily. 180 tablet 3  . chlorhexidine (PERIDEX) 0.12 % solution See admin instructions.  0  . clopidogrel (PLAVIX) 75 MG tablet Take 1 tablet (75 mg total) by mouth daily. 90 tablet 3  . cyclobenzaprine (FLEXERIL) 10 MG tablet TAKE ONE TABLET BY MOUTH NIGHTLY AT BEDTIME AS NEEDED FOR MUSCLE SPASM 90 tablet 3  . HYDROcodone-acetaminophen (NORCO/VICODIN) 5-325 MG per tablet Take 1 tablet by mouth every 4 (four) hours as needed (for dental procedure/ pt unsure of tablets).     Marland Kitchen lisinopril-hydrochlorothiazide (PRINZIDE,ZESTORETIC) 20-12.5 MG tablet Take 1 tablet by mouth daily. 90 tablet 3  . metoprolol tartrate (LOPRESSOR) 25 MG tablet Take 1 tablet (25 mg total) by mouth 2 (two) times daily. 180 tablet 3  . Multiple Vitamin (MULTIVITAMIN) capsule Take 1 capsule by mouth daily.      . nitroGLYCERIN (NITROSTAT) 0.4 MG SL tablet Place 1 tablet (0.4 mg total) under the tongue every 5 (five) minutes as needed. 25 tablet 1  . sildenafil (VIAGRA) 100 MG tablet Take 1 tablet (100 mg total) by mouth daily as needed. 10 tablet 11  . terbinafine (LAMISIL) 250 MG tablet Take 1 tablet (250 mg total) by mouth daily. 30 tablet 3   No current facility-administered medications on file prior to visit.      There were no vitals taken for this visit.      Objective:   Physical Exam  Constitutional: He is oriented to person, place, and time. He appears well-developed and well-nourished. No distress.  HENT:  Head: Normocephalic and atraumatic.  Right Ear: External ear normal.  Left Ear: External ear normal.  Nose: Nose normal.  Mouth/Throat: Oropharynx is clear and moist. No oropharyngeal exudate.  Eyes: Conjunctivae and EOM are normal. Pupils are equal, round, and reactive to light. Right eye exhibits no discharge. Left eye exhibits no discharge. No scleral icterus.  Neck: Normal range of motion. Neck supple. No JVD present. No tracheal deviation present. No thyromegaly present.  Cardiovascular: Normal  rate, regular rhythm, normal heart sounds and intact distal pulses. Exam reveals no gallop and no friction rub.  No murmur heard. Pulmonary/Chest: Effort normal and breath sounds normal. No stridor. No respiratory distress. He has no wheezes. He has no rales. He exhibits no tenderness.  Abdominal: Soft. Bowel sounds are normal. He exhibits no distension and no mass. There is no tenderness. There is no rebound and no guarding.  Musculoskeletal: Normal range of motion. He exhibits no edema, tenderness or deformity.  Lymphadenopathy:    He has no cervical adenopathy.  Neurological: He is alert and oriented to person, place, and time. He has normal reflexes. He displays normal reflexes. No cranial nerve deficit. He exhibits normal muscle tone. Coordination normal.  Skin: Skin is warm and dry. No rash noted. He is not diaphoretic. No erythema. No pallor.  Scattered moles, cherry angiomas, and AK's  Surgical scar noted on left leg  Red, raised, circular rash noted on left buttock   Psychiatric: He has a normal mood and affect. His behavior is normal. Judgment and thought content normal.  Nursing note and vitals reviewed.     Assessment & Plan:  1. Routine general medical examination at a  health care facility - Follow up in one year or sooner if needed - Encouraged weight loss through diet and exercise  - Basic metabolic panel - CBC with Differential/Platelet - Hepatic function panel - Lipid panel - PSA  2. Essential hypertension BP Readings from Last 3 Encounters:  10/04/17 128/72  07/20/17 130/76  07/13/17 120/64  - Well controlled. No change in medication   - Basic metabolic panel - CBC with Differential/Platelet - Hepatic function panel - Lipid panel - PSA  3. Pure hypercholesterolemia -  - Basic metabolic panel - CBC with Differential/Platelet - Hepatic function panel - Lipid panel - PSA  4. Fungal infection of skin - Mix nystatin and steroid cream that he already has at home  - Follow up with dermatology in one month   Dorothyann Peng, NP

## 2017-10-04 NOTE — Patient Instructions (Signed)
It was great seeing you today   I will follow up with you regarding your blood work   I have sent in Nystatin anti fungal. Please mix a small amount of this with a steroid cream you have at home.   Have a happy holiday season!

## 2017-10-18 ENCOUNTER — Encounter: Payer: Self-pay | Admitting: Adult Health

## 2017-10-18 MED ORDER — METOPROLOL TARTRATE 25 MG PO TABS: 25.0000 mg | ORAL_TABLET | Freq: Two times a day (BID) | ORAL | 3 refills | Status: DC

## 2017-10-18 MED ORDER — CLOPIDOGREL BISULFATE 75 MG PO TABS: 75.0000 mg | ORAL_TABLET | Freq: Every day | ORAL | 3 refills | Status: DC

## 2017-10-27 ENCOUNTER — Ambulatory Visit (HOSPITAL_COMMUNITY)
Admission: RE | Admit: 2017-10-27 | Discharge: 2017-10-27 | Disposition: A | Discharge status: Home / Self Care | Payer: Medicare Other | Source: Ambulatory Visit | Attending: Internal Medicine | Admitting: Internal Medicine

## 2017-10-27 DIAGNOSIS — I714 Abdominal aortic aneurysm, without rupture, unspecified: Secondary | ICD-10-CM

## 2017-10-28 ENCOUNTER — Encounter (INDEPENDENT_AMBULATORY_CARE_PROVIDER_SITE_OTHER): Payer: Self-pay

## 2017-11-02 ENCOUNTER — Encounter: Payer: Self-pay | Admitting: Adult Health

## 2017-11-02 MED ORDER — CYCLOBENZAPRINE HCL 10 MG PO TABS: ORAL_TABLET | ORAL | 0 refills | Status: DC

## 2017-11-04 ENCOUNTER — Telehealth: Payer: Self-pay | Admitting: Nurse Practitioner

## 2017-11-04 NOTE — Telephone Encounter (Signed)
°  Follow Up ° ° °Returning call regarding results. Please call. °

## 2017-11-23 ENCOUNTER — Encounter: Payer: Self-pay | Admitting: Adult Health

## 2017-11-24 DIAGNOSIS — J00 Acute nasopharyngitis [common cold]: Secondary | ICD-10-CM | POA: Diagnosis not present

## 2017-11-24 DIAGNOSIS — Z87891 Personal history of nicotine dependence: Secondary | ICD-10-CM | POA: Diagnosis not present

## 2017-12-25 ENCOUNTER — Encounter: Payer: Self-pay | Admitting: Adult Health

## 2017-12-25 DIAGNOSIS — E7800 Pure hypercholesterolemia, unspecified: Secondary | ICD-10-CM

## 2017-12-25 DIAGNOSIS — I25810 Atherosclerosis of coronary artery bypass graft(s) without angina pectoris: Secondary | ICD-10-CM

## 2017-12-25 DIAGNOSIS — I10 Essential (primary) hypertension: Secondary | ICD-10-CM

## 2017-12-27 MED ORDER — CLOPIDOGREL BISULFATE 75 MG PO TABS: 75.0000 mg | ORAL_TABLET | Freq: Every day | ORAL | 2 refills | Status: DC

## 2017-12-27 MED ORDER — NITROGLYCERIN 0.4 MG SL SUBL: 0.4000 mg | SUBLINGUAL_TABLET | SUBLINGUAL | 1 refills | Status: DC | PRN

## 2017-12-27 MED ORDER — ATORVASTATIN CALCIUM 80 MG PO TABS: ORAL_TABLET | ORAL | 2 refills | Status: DC

## 2017-12-27 MED ORDER — METOPROLOL TARTRATE 25 MG PO TABS: 25.0000 mg | ORAL_TABLET | Freq: Two times a day (BID) | ORAL | 2 refills | Status: DC

## 2017-12-27 MED ORDER — LISINOPRIL-HYDROCHLOROTHIAZIDE 20-12.5 MG PO TABS: 1.0000 | ORAL_TABLET | Freq: Every day | ORAL | 2 refills | Status: DC

## 2017-12-27 NOTE — Telephone Encounter (Signed)
Sent to the pharmacy by e-scribe. 

## 2017-12-27 NOTE — Telephone Encounter (Signed)
I tried to update the [employer] in the chart but the RTE keeps ignoring the response. I have went to [employer] and scanned in a copy of [employer] Eligibility into the documents of the chart. I will keep trying to attempt to enter into the chart. The [employer] is still showing eligible also.

## 2018-01-06 ENCOUNTER — Other Ambulatory Visit: Payer: Self-pay | Admitting: Adult Health

## 2018-01-06 ENCOUNTER — Encounter: Payer: Self-pay | Admitting: Adult Health

## 2018-01-06 DIAGNOSIS — F32A Depression, unspecified: Secondary | ICD-10-CM

## 2018-01-06 DIAGNOSIS — F329 Major depressive disorder, single episode, unspecified: Secondary | ICD-10-CM

## 2018-01-06 MED ORDER — CYCLOBENZAPRINE HCL 10 MG PO TABS: ORAL_TABLET | ORAL | 0 refills | Status: DC

## 2018-01-06 MED ORDER — BUPROPION HCL ER (SR) 150 MG PO TB12: 150.0000 mg | ORAL_TABLET | Freq: Two times a day (BID) | ORAL | 1 refills | Status: DC

## 2018-01-13 DIAGNOSIS — M25551 Pain in right hip: Secondary | ICD-10-CM | POA: Diagnosis not present

## 2018-01-13 DIAGNOSIS — M1611 Unilateral primary osteoarthritis, right hip: Secondary | ICD-10-CM | POA: Diagnosis not present

## 2018-01-13 DIAGNOSIS — M25562 Pain in left knee: Secondary | ICD-10-CM | POA: Diagnosis not present

## 2018-01-20 ENCOUNTER — Telehealth: Payer: Self-pay | Admitting: *Deleted

## 2018-01-20 NOTE — Telephone Encounter (Signed)
   [facility] Pre-operative Risk Assessment    Request for surgical clearance:  1. What type of surgery is being performed? Right Hip:THA w/wo autograft/allograft  2. When is this surgery scheduled? 04/19/2018  3. What type of clearance is required (medical clearance vs. Pharmacy clearance to hold med vs. Both)? Both  4. Are there any medications that need to be held prior to surgery and how long? Plavix before and after surgery  5. Practice name and name of physician performing surgery? [facility]  6. What is your office phone and fax number? [facility] P# [phone], F# [phone]  7. Anesthesia type (None, local, MAC, general) ? Not stated   Marlis Edelson 01/20/2018, 2:03 PM  _________________________________________________________________   (provider comments below)

## 2018-01-23 NOTE — Telephone Encounter (Signed)
   Primary Cardiologist: Dr. Harlen Labs NP  Chart reviewed as part of pre-operative protocol coverage. Because of Dave Robles's past medical history and time since last visit, he will require a follow-up visit in order to better assess preoperative cardiovascular risk.  Pt has his 6 month f/u scheduled with Dave Merle, NP, tomorrow. She has been notified to address preop evaluation at tomorrow's visit.      Dave Jester, PA-C  01/23/2018, 4:19 PM

## 2018-01-24 ENCOUNTER — Encounter: Payer: Self-pay | Admitting: Nurse Practitioner

## 2018-01-24 ENCOUNTER — Ambulatory Visit: Payer: Medicare HMO | Admitting: Nurse Practitioner

## 2018-01-24 VITALS — BP 138/76 | HR 63 | Ht 69.5 in | Wt 249.8 lb

## 2018-01-24 DIAGNOSIS — I259 Chronic ischemic heart disease, unspecified: Secondary | ICD-10-CM | POA: Diagnosis not present

## 2018-01-24 NOTE — Patient Instructions (Addendum)
We will be checking the following labs today - NONE   Medication Instructions:    Continue with your current medicines.     Testing/Procedures To Be Arranged:  N/A  Follow-Up:   See me in 6 months    Other Special Instructions:   I will send a note to Dr. Wynelle Link regarding your surgical clearance.   Hold Plavix 5 days prior to surgery  Stay on aspirin  Send me a message if this tingling in your shoulder becomes more persistent, have more with exertion, etc. We would do further testing.     If you need a refill on your cardiac medications before your next appointment, please call your pharmacy.   Call the [place] office at [phone] if you have any questions, problems or concerns.

## 2018-01-24 NOTE — Progress Notes (Signed)
CARDIOLOGY OFFICE NOTE  Date:  01/24/2018    Dave Robles Date of Birth: [birth year] Medical Record #[id number]  PCP:  Dorothyann Peng, NP  Cardiologist:  Servando Snare   Chief Complaint  Patient presents with  . Hypertension  . Hyperlipidemia  . Coronary Artery Disease  . Pre-op Exam    6 month visit    History of Present Illness: Dave Robles is a 72 y.o. male who presents today for a 6 month/pre op visit. Former patient of Dr. Claris Gladden - typically follows with me.   He has HTN, HLD, CAD with remote CABG in 1991, negative Myoview in 2011 with EF 67% and no ischemia, ED, tobacco abuse, asthma and depression. Small AAA noted on duplex study from December of 2017.   Seen in April of 2014 - he had had a brief syncopal spell six weeks prior to that visit - Myoview was updated - this looked good.  Seen back in March of 2016 after being out in [place] for a long vacation the month prior. Had been exercising pretty hard - riding his bike about 15 miles per day. One day he just did not feel well. No real chest pain but was concerned and went to the ER. Told them he had CAD. EKG was ok. Troponin was +. He was cathed - noted to have NL left main, 90% prox LAD, 99% mid LAD with patent LIMA to mid LAD and patent SVG to DX. LCX origin stenosis vs flow void? Fistula vs competitive flow in branch?, 100% RCA, SVG to RCA 99% origin treated with Xience DES and PTCA to the distal. He is on Plavix. He was told that "there was something squirting" and that he possibly needs a CT scan???? Placed on Metoprolol.   His cath CD was from [place] was reviewed with Dr. 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 Burt Knack did NOT feel any treatment is necessary - this is oxygenated blood going to oxygenated blood.  I increased his beta blocker and advised that we needed to up titrate his medicines due to his LV dysfunction. His EF was 45 to 50%.    Last seen back in September - was doing ok but having more orthopedic issues. Was considering hip surgery. Has been found to have small AAA which needs annual surveillance - last done in December of 2018.   Comes in today. Here alone. He feels like he is doing well. He spent 2 months at his condo in [place] this past winter - notes the weather was actually better here than there. He denies chest pain - but actually has never had a chest pain syndrome. He noted more "harder to work" with his CABG. With his event a few days years ago - noted "just did not feel right". He is not short of breath. Not dizzy or lightheaded. Remains pretty active and not really limited in this regards - does have some hip pain. Planning on right hip replacement in June. He has noted over the past 10 days or so some fleeting "tingling" in his left upper shoulder - it does not radiate - does not occur with exertion and will last for about 10 seconds. Nothing he can do brings it on and nothing makes it go away.   According to the Revised Cardiac Risk Index (RCRI), his Perioperative Risk of Major Cardiac Event is (%): 0.9  His Functional Capacity in  METs is: 9.89 according to the [facility] Activity Status Index (DASI).  Past Medical History:  Diagnosis Date  . AAA (abdominal aortic aneurysm) ([place])   . Adjustment disorder with depressed mood 03/04/2009  . Colon polyps   . CORONARY ARTERY DISEASE 07/10/2007  . Depression   . ERECTILE DYSFUNCTION 07/10/2007  . Extrinsic asthma, unspecified 09/10/2008  . HYPERLIPIDEMIA 07/10/2007  . HYPERTENSION 07/10/2007  . Pneumonia due to Mycoplasma pneumoniae 09/10/2008  . TOBACCO USE 12/15/2009    Past Surgical History:  Procedure Laterality Date  . COLONOSCOPY    . CORONARY ARTERY BYPASS GRAFT  11/15/89   5 vessels  . HYDROCELE EXCISION / REPAIR  2011  . INGUINAL HERNIA REPAIR Left 1985     Medications: Current Meds  Medication Sig  . aspirin 81 MG tablet Take 81 mg by mouth  daily.    Marland Kitchen atorvastatin (LIPITOR) 80 MG tablet TAKE 1 TABLET (80 MG TOTAL) BY MOUTH DAILY.  Marland Kitchen buPROPion (WELLBUTRIN SR) 150 MG 12 hr tablet Take 1 tablet (150 mg total) by mouth 2 (two) times daily.  . chlorhexidine (PERIDEX) 0.12 % solution See admin instructions.  . clopidogrel (PLAVIX) 75 MG tablet Take 1 tablet (75 mg total) by mouth daily.  . cyclobenzaprine (FLEXERIL) 10 MG tablet TAKE ONE TABLET BY MOUTH NIGHTLY AT BEDTIME AS NEEDED FOR MUSCLE SPASM  . HYDROcodone-acetaminophen (NORCO/VICODIN) 5-325 MG per tablet Take 1 tablet by mouth every 4 (four) hours as needed (for dental procedure/ pt unsure of tablets).   Marland Kitchen lisinopril-hydrochlorothiazide (PRINZIDE,ZESTORETIC) 20-12.5 MG tablet Take 1 tablet by mouth daily.  . metoprolol tartrate (LOPRESSOR) 25 MG tablet Take 1 tablet (25 mg total) by mouth 2 (two) times daily.  . Multiple Vitamin (MULTIVITAMIN) capsule Take 1 capsule by mouth daily.    . nitroGLYCERIN (NITROSTAT) 0.4 MG SL tablet Place 1 tablet (0.4 mg total) under the tongue every 5 (five) minutes as needed.  . nystatin cream (MYCOSTATIN) Apply 1 application topically as needed for dry skin (rash).  . sildenafil (VIAGRA) 100 MG tablet Take 1 tablet (100 mg total) by mouth daily as needed.  . [DISCONTINUED] nystatin cream (MYCOSTATIN) Apply 1 application 2 (two) times daily topically.     Allergies: Allergies  Allergen Reactions  . Other Other (See Comments)    Histinex - causes tingling    Social History: The patient  reports that he quit smoking about 8 years ago. He smoked 0.50 packs per day. he has never used smokeless tobacco. He reports that he drinks about 3.0 oz of alcohol per week. He reports that he does not use drugs.   Family History: The patient's family history includes Cancer in his mother; Coronary artery disease in his father; Depression in his mother and paternal grandfather; Hyperlipidemia in his other; Hypertension in his other.   Review of  Systems: Please see the history of present illness.   Otherwise, the review of systems is positive for none.   All other systems are reviewed and negative.   Physical Exam: VS:  BP 138/76   Pulse 63   Ht 5' 9.5" (1.765 m)   Wt 249 lb 12.8 oz (113.3 kg)   BMI 36.36 kg/m  .  BMI Body mass index is 36.36 kg/m.  Wt Readings from Last 3 Encounters:  01/24/18 249 lb 12.8 oz (113.3 kg)  10/04/17 243 lb (110.2 kg)  07/20/17 244 lb 1.9 oz (110.7 kg)    General: Pleasant. Obese. Alert and in no acute distress.  HEENT: Normal.  Neck: Supple, no JVD, carotid bruits, or masses noted.  Cardiac: Regular rate and rhythm. No murmurs, rubs, or gallops. No edema.  Respiratory:  Lungs are clear to auscultation bilaterally with normal work of breathing.  GI: Soft and nontender.  MS: No deformity or atrophy. Gait and ROM intact.  Skin: Warm and dry. Color is normal.  Neuro:  Strength and sensation are intact and no gross focal deficits noted.  Psych: Alert, appropriate and with normal affect.   LABORATORY DATA:  EKG:  EKG is ordered today. This demonstrates NSR and is unchanged. Chronic Q's in lead 3.  Lab Results  Component Value Date   WBC 7.0 10/04/2017   HGB 16.1 10/04/2017   HCT 47.1 10/04/2017   PLT 162.0 10/04/2017   GLUCOSE 75 10/04/2017   CHOL 129 10/04/2017   TRIG 185.0 (H) 10/04/2017   HDL 33.40 (L) 10/04/2017   LDLDIRECT 66.7 11/30/2011   LDLCALC 59 10/04/2017   ALT 22 10/04/2017   AST 19 10/04/2017   NA 140 10/04/2017   K 3.8 10/04/2017   CL 104 10/04/2017   CREATININE 1.29 10/04/2017   BUN 15 10/04/2017   CO2 30 10/04/2017   TSH 2.16 08/20/2016   PSA 0.88 10/04/2017     BNP (last 3 results) No results for input(s): BNP in the last 8760 hours.  ProBNP (last 3 results) No results for input(s): PROBNP in the last 8760 hours.   Other Studies Reviewed Today:  Echo Study Conclusions from 03/2015  - Left ventricle: The cavity size was normal. Systolic function  was normal. The estimated ejection fraction was in the range of 55% to 60%. Wall motion was normal; there were no regional wall motion abnormalities. Left ventricular diastolic function parameters were normal. - Left atrium: The atrium was mildly dilated. - Atrial septum: No defect or patent foramen ovale was identified  Assessment/Plan:  1. CAD/prior NSTEMI - with PCI to SVG to RCA in March of 2016 - EF 45%. Cath film was reviewed with Dr. Burt Knack previously. No further intervention felt to be needed and it was advised to continue with medical management - he has been continued on DAPT. He currently does well with great exercise tolerance. He has never had chest pain syndrome. He has some fleeting tingling in his shoulder - this does not sound cardiac - but he has been advised to let us know if this becomes more persistent or exertional in nature - he would need further testing if that were to happen.   2. Pre op clearance - overall felt to be a satisfactory candidate for hip surgery - this is planned for June. Hold Plavix 5 days prior. Would continue aspirin. Would be available if problems arise.   3. Mild LV dysfunction - with resolution per echo from May of 2016. He has no symptoms noted.   4. HLD - on statin - labs from November noted. He has had issus with myalgias in the past - this was not mentioned today.   5. HTN - BP looks good. No change in his current regimen.  6. Obesity - he knows he needs to get his weight going back the other direction. Hopefully once his orthopedic issues are resolved, he can get back on track.   7. AAA - he had his study in December of 2018 - some increase in size noted - currently 3.6cm - will need repeat study in December of 2019.     Current medicines are reviewed  with the patient today.  The patient does not have concerns regarding medicines other than what has been noted above.  The following changes have been made:  See  above.  Labs/ tests ordered today include:   No orders of the defined types were placed in this encounter.    Disposition:   FU with me in 6 months.   Patient is agreeable to this plan and will call if any problems develop in the interim.   SignedTruitt Merle, NP  01/24/2018 8:53 AM  [facility] [address] [place], [place]  [address] Phone: [phone] Fax: [phone]

## 2018-01-31 DIAGNOSIS — R69 Illness, unspecified: Secondary | ICD-10-CM | POA: Diagnosis not present

## 2018-02-01 ENCOUNTER — Encounter (INDEPENDENT_AMBULATORY_CARE_PROVIDER_SITE_OTHER): Payer: Self-pay

## 2018-02-06 DIAGNOSIS — H35033 Hypertensive retinopathy, bilateral: Secondary | ICD-10-CM | POA: Diagnosis not present

## 2018-02-06 DIAGNOSIS — H40023 Open angle with borderline findings, high risk, bilateral: Secondary | ICD-10-CM | POA: Diagnosis not present

## 2018-02-06 DIAGNOSIS — H43812 Vitreous degeneration, left eye: Secondary | ICD-10-CM | POA: Diagnosis not present

## 2018-02-06 DIAGNOSIS — H40053 Ocular hypertension, bilateral: Secondary | ICD-10-CM | POA: Diagnosis not present

## 2018-02-07 ENCOUNTER — Telehealth: Payer: Self-pay | Admitting: *Deleted

## 2018-02-07 ENCOUNTER — Other Ambulatory Visit: Payer: Self-pay | Admitting: *Deleted

## 2018-02-07 DIAGNOSIS — M25519 Pain in unspecified shoulder: Secondary | ICD-10-CM

## 2018-02-07 DIAGNOSIS — I2510 Atherosclerotic heart disease of native coronary artery without angina pectoris: Secondary | ICD-10-CM

## 2018-02-07 DIAGNOSIS — Z01818 Encounter for other preprocedural examination: Secondary | ICD-10-CM

## 2018-02-07 NOTE — Telephone Encounter (Signed)
-----   Message from [name], NP sent at 02/07/2018 12:31 PM EDT ----- He needs a lexiscan - diagnosis is pre op clearance/CAD/shoulder pain  lori

## 2018-02-07 NOTE — Telephone Encounter (Signed)
S/w pt is aware of date and time for stress test.  Went over pt's instructions.

## 2018-02-08 DIAGNOSIS — R69 Illness, unspecified: Secondary | ICD-10-CM | POA: Diagnosis not present

## 2018-02-09 ENCOUNTER — Telehealth (HOSPITAL_COMMUNITY): Payer: Self-pay | Admitting: *Deleted

## 2018-02-09 NOTE — Telephone Encounter (Signed)
Patient given detailed instructions per Myocardial Perfusion Study Information Sheet for the test on 02/14/18. Patient notified to arrive 15 minutes early and that it is imperative to arrive on time for appointment to keep from having the test rescheduled.  If you need to cancel or reschedule your appointment, please call the office within 24 hours of your appointment. . Patient verbalized understanding. Kirstie Peri, RN

## 2018-02-14 ENCOUNTER — Telehealth: Payer: Self-pay | Admitting: Nurse Practitioner

## 2018-02-14 ENCOUNTER — Ambulatory Visit (HOSPITAL_COMMUNITY): Payer: Medicare HMO | Attending: Cardiology

## 2018-02-14 DIAGNOSIS — I2510 Atherosclerotic heart disease of native coronary artery without angina pectoris: Secondary | ICD-10-CM | POA: Insufficient documentation

## 2018-02-14 DIAGNOSIS — Z01818 Encounter for other preprocedural examination: Secondary | ICD-10-CM | POA: Diagnosis not present

## 2018-02-14 DIAGNOSIS — M25519 Pain in unspecified shoulder: Secondary | ICD-10-CM | POA: Insufficient documentation

## 2018-02-14 LAB — MYOCARDIAL PERFUSION IMAGING
LV dias vol: 104 mL (ref 62–150)
LV sys vol: 48 mL
Peak HR: 66 {beats}/min
RATE: 0.32
Rest HR: 55 {beats}/min
SDS: 1
SRS: 3
SSS: 4
TID: 0.98

## 2018-02-14 MED ORDER — TECHNETIUM TC 99M TETROFOSMIN IV KIT
10.2000 | PACK | Freq: Once | INTRAVENOUS | Status: AC | PRN
  Administered 2018-02-14: 10.2 via INTRAVENOUS
  Filled 2018-02-14: qty 11

## 2018-02-14 MED ORDER — TECHNETIUM TC 99M TETROFOSMIN IV KIT
31.3000 | PACK | Freq: Once | INTRAVENOUS | Status: AC | PRN
  Administered 2018-02-14: 31.3 via INTRAVENOUS
  Filled 2018-02-14: qty 32

## 2018-02-14 MED ORDER — REGADENOSON 0.4 MG/5ML IV SOLN
0.4000 mg | Freq: Once | INTRAVENOUS | Status: AC
  Administered 2018-02-14: 0.4 mg via INTRAVENOUS

## 2018-02-14 NOTE — Telephone Encounter (Signed)
New message     Patient is returning [place] call

## 2018-03-01 ENCOUNTER — Ambulatory Visit (INDEPENDENT_AMBULATORY_CARE_PROVIDER_SITE_OTHER): Payer: Medicare HMO | Admitting: Adult Health

## 2018-03-01 ENCOUNTER — Encounter: Payer: Self-pay | Admitting: Adult Health

## 2018-03-01 VITALS — BP 122/70 | Temp 98.4°F | Wt 246.0 lb

## 2018-03-01 DIAGNOSIS — M5412 Radiculopathy, cervical region: Secondary | ICD-10-CM | POA: Diagnosis not present

## 2018-03-01 DIAGNOSIS — R21 Rash and other nonspecific skin eruption: Secondary | ICD-10-CM | POA: Diagnosis not present

## 2018-03-01 NOTE — Progress Notes (Signed)
Subjective:    Patient ID: Dave Robles, male    DOB: November 06, 1946, 72 y.o.   MRN: [id number]  HPI  72 year old male who  has a past medical history of AAA (abdominal aortic aneurysm) ([place]), Adjustment disorder with depressed mood (03/04/2009), Colon polyps, CORONARY ARTERY DISEASE (07/10/2007), Depression, ERECTILE DYSFUNCTION (07/10/2007), Extrinsic asthma, unspecified (09/10/2008), HYPERLIPIDEMIA (07/10/2007), HYPERTENSION (07/10/2007), Pneumonia due to Mycoplasma pneumoniae (09/10/2008), and TOBACCO USE (12/15/2009).  He presents to the office today with 2 complaints.  1.  Red rash on bilateral forearms.  Reports that he recently traveled to home and had, stayed in a hotel and developed a red rash on bilateral arms.  Rash has improved since being home.  He reports that when the rash was at its worse it was red and itchy.  Denies any drainage or vesicles.  2.  Intermittent episodes of tingling in left shoulder   This is been happening over the last couple months.  Tingling does not radiate past bicep.  Does not occur with exertion and nothing he does can bring it on or make it go away.  Tingling lasts about 10 seconds at a time.  He has recently had a cardiac workup stress test was told to be low risk.  EF is 54%.  No ST changes were noted.  Reports that over the last few weeks episodes have becoming less infrequent and he has been exercising more regularly.  His any chest pain or shortness of breath.     Review of Systems See HPI   Past Medical History:  Diagnosis Date  . AAA (abdominal aortic aneurysm) ([place])   . Adjustment disorder with depressed mood 03/04/2009  . Colon polyps   . CORONARY ARTERY DISEASE 07/10/2007  . Depression   . ERECTILE DYSFUNCTION 07/10/2007  . Extrinsic asthma, unspecified 09/10/2008  . HYPERLIPIDEMIA 07/10/2007  . HYPERTENSION 07/10/2007  . Pneumonia due to Mycoplasma pneumoniae 09/10/2008  . TOBACCO USE 12/15/2009    Social History   Socioeconomic History  .  Marital status: Married    Spouse name: Not on file  . Number of children: Not on file  . Years of education: Not on file  . Highest education level: Not on file  Occupational History  . Not on file  Social Needs  . Financial resource strain: Not on file  . Food insecurity:    Worry: Not on file    Inability: Not on file  . Transportation needs:    Medical: Not on file    Non-medical: Not on file  Tobacco Use  . Smoking status: Former Smoker    Packs/day: 0.50    Last attempt to quit: 01/13/2010    Years since quitting: 8.1  . Smokeless tobacco: Never Used  Substance and Sexual Activity  . Alcohol use: Yes    Alcohol/week: 3.0 oz    Types: 5 Cans of beer per week  . Drug use: No  . Sexual activity: Yes    Comment: regular exercise - yes  Lifestyle  . Physical activity:    Days per week: Not on file    Minutes per session: Not on file  . Stress: Not on file  Relationships  . Social connections:    Talks on phone: Not on file    Gets together: Not on file    Attends religious service: Not on file    Active member of club or organization: Not on file    Attends meetings of clubs or organizations:  Not on file    Relationship status: Not on file  . Intimate partner violence:    Fear of current or ex partner: Not on file    Emotionally abused: Not on file    Physically abused: Not on file    Forced sexual activity: Not on file  Other Topics Concern  . Not on file  Social History Narrative   Retired from being an Financial controller of a small business    Married    No kids       He likes to play golf and rides bike        Past Surgical History:  Procedure Laterality Date  . COLONOSCOPY    . CORONARY ARTERY BYPASS GRAFT  11/15/89   5 vessels  . HYDROCELE EXCISION / REPAIR  2011  . INGUINAL HERNIA REPAIR Left 1985    Family History  Problem Relation Age of Onset  . Coronary artery disease Father        cabg  . Cancer Mother        Breast and or lung   . Depression Mother    . Hyperlipidemia Other   . Hypertension Other   . Depression Paternal Grandfather   . Colon cancer Neg Hx     Allergies  Allergen Reactions  . Other Other (See Comments)    Histinex - causes tingling    Current Outpatient Medications on File Prior to Visit  Medication Sig Dispense Refill  . aspirin 81 MG tablet Take 81 mg by mouth daily.      Marland Kitchen atorvastatin (LIPITOR) 80 MG tablet TAKE 1 TABLET (80 MG TOTAL) BY MOUTH DAILY. 90 tablet 2  . buPROPion (WELLBUTRIN SR) 150 MG 12 hr tablet Take 1 tablet (150 mg total) by mouth 2 (two) times daily. 180 tablet 1  . chlorhexidine (PERIDEX) 0.12 % solution See admin instructions.  0  . clopidogrel (PLAVIX) 75 MG tablet Take 1 tablet (75 mg total) by mouth daily. 90 tablet 2  . cyclobenzaprine (FLEXERIL) 10 MG tablet TAKE ONE TABLET BY MOUTH NIGHTLY AT BEDTIME AS NEEDED FOR MUSCLE SPASM 90 tablet 0  . HYDROcodone-acetaminophen (NORCO/VICODIN) 5-325 MG per tablet Take 1 tablet by mouth every 4 (four) hours as needed (for dental procedure/ pt unsure of tablets).     Marland Kitchen lisinopril-hydrochlorothiazide (PRINZIDE,ZESTORETIC) 20-12.5 MG tablet Take 1 tablet by mouth daily. 90 tablet 2  . metoprolol tartrate (LOPRESSOR) 25 MG tablet Take 1 tablet (25 mg total) by mouth 2 (two) times daily. 180 tablet 2  . Multiple Vitamin (MULTIVITAMIN) capsule Take 1 capsule by mouth daily.      . nitroGLYCERIN (NITROSTAT) 0.4 MG SL tablet Place 1 tablet (0.4 mg total) under the tongue every 5 (five) minutes as needed. 25 tablet 1  . nystatin cream (MYCOSTATIN) Apply 1 application topically as needed for dry skin (rash).    . sildenafil (VIAGRA) 100 MG tablet Take 1 tablet (100 mg total) by mouth daily as needed. 10 tablet 11   No current facility-administered medications on file prior to visit.     BP 122/70 (BP Location: Left Arm)   Temp 98.4 F (36.9 C) (Oral)   Wt 246 lb (111.6 kg)   BMI 36.33 kg/m       Objective:   Physical Exam  Constitutional: He is  oriented to person, place, and time. He appears well-developed and well-nourished. No distress.  Cardiovascular: Normal rate, regular rhythm, normal heart sounds and intact distal pulses. Exam reveals no  gallop and no friction rub.  No murmur heard. Pulmonary/Chest: Effort normal and breath sounds normal. No respiratory distress. He has no wheezes. He has no rales. He exhibits no tenderness.  Musculoskeletal: Normal range of motion. He exhibits no edema, tenderness or deformity.  Decrease in grip strength.  No pain with palpation along left shoulder  Neurological: He is alert and oriented to person, place, and time.  Skin: Skin is warm and dry. No rash noted. He is not diaphoretic. No erythema. No pallor.  No noticeable rash seen  Psychiatric: He has a normal mood and affect. His behavior is normal. Judgment and thought content normal.  Nursing note and vitals reviewed.     Assessment & Plan:  1. Rash -Is improved.  Possibly due to contact dermatitis.  He was staying at a hotel so he was exposed to new soaps and shampoos.  Follow-up if rash comes back  2. Cervical radiculitis -Symptoms seem to be pointing towards cervical radiculopathy.  Advised stretching exercises, heating pads, can take Motrin as needed.  Follow-up if no improvement.  Consider imaging in the future  Dorothyann Peng, NP

## 2018-03-14 DIAGNOSIS — M7712 Lateral epicondylitis, left elbow: Secondary | ICD-10-CM | POA: Diagnosis not present

## 2018-03-14 DIAGNOSIS — M1712 Unilateral primary osteoarthritis, left knee: Secondary | ICD-10-CM | POA: Diagnosis not present

## 2018-04-01 ENCOUNTER — Other Ambulatory Visit: Payer: Self-pay | Admitting: Adult Health

## 2018-04-03 NOTE — Patient Instructions (Addendum)
Dave Robles  04/03/2018   Your procedure is scheduled on: Wednesday 04/19/2018  Report to [facility] Main  Entrance              Report to admitting at   [place] AM     Call this number if you have problems the morning of surgery [phone]    Remember: Do not eat food or drink liquids :After Midnight.     Take these medicines the morning of surgery with A SIP OF WATER: Metoprolol Tartrate (Lopressor), Bupropion (Wellbutrin), Atorvastatin (Lipitor)                            You may not have any metal on your body including hair pins and              piercings  Do not wear jewelry, lotions, powders or perfumes, deodorant                          Men may shave face and neck.   Do not bring valuables to the hospital. [place].  Contacts, dentures or bridgework may not be worn into surgery.  Leave suitcase in the car. After surgery it may be brought to your room.                  Please read over the following fact sheets you were given: _____________________________________________________________________             [facility] - Preparing for Surgery Before surgery, you can play an important role.  Because skin is not sterile, your skin needs to be as free of germs as possible.  You can reduce the number of germs on your skin by washing with CHG (chlorahexidine gluconate) soap before surgery.  CHG is an antiseptic cleaner which kills germs and bonds with the skin to continue killing germs even after washing. Please DO NOT use if you have an allergy to CHG or antibacterial soaps.  If your skin becomes reddened/irritated stop using the CHG and inform your nurse when you arrive at Short Stay. Do not shave (including legs and underarms) for at least 48 hours prior to the first CHG shower.  You may shave your face/neck. Please follow these instructions carefully:  1.  Shower with CHG Soap the night  before surgery and the  morning of Surgery.  2.  If you choose to wash your hair, wash your hair first as usual with your  normal  shampoo.  3.  After you shampoo, rinse your hair and body thoroughly to remove the  shampoo.                           4.  Use CHG as you would any other liquid soap.  You can apply chg directly  to the skin and wash                       Gently with a scrungie or clean washcloth.  5.  Apply the CHG Soap to your body ONLY FROM THE NECK DOWN.   Do not use on face/ open  Wound or open sores. Avoid contact with eyes, ears mouth and genitals (private parts).                       Wash face,  Genitals (private parts) with your normal soap.             6.  Wash thoroughly, paying special attention to the area where your surgery  will be performed.  7.  Thoroughly rinse your body with warm water from the neck down.  8.  DO NOT shower/wash with your normal soap after using and rinsing off  the CHG Soap.                9.  Pat yourself dry with a clean towel.            10.  Wear clean pajamas.            11.  Place clean sheets on your bed the night of your first shower and do not  sleep with pets. Day of Surgery : Do not apply any lotions/deodorants the morning of surgery.  Please wear clean clothes to the hospital/surgery center.  FAILURE TO FOLLOW THESE INSTRUCTIONS MAY RESULT IN THE CANCELLATION OF YOUR SURGERY PATIENT SIGNATURE_________________________________  NURSE SIGNATURE__________________________________  ________________________________________________________________________   Dave Robles  An incentive spirometer is a tool that can help keep your lungs clear and active. This tool measures how well you are filling your lungs with each breath. Taking long deep breaths may help reverse or decrease the chance of developing breathing (pulmonary) problems (especially infection) following:  A long period of time when you are  unable to move or be active. BEFORE THE PROCEDURE   If the spirometer includes an indicator to show your best effort, your nurse or respiratory therapist will set it to a desired goal.  If possible, sit up straight or lean slightly forward. Try not to slouch.  Hold the incentive spirometer in an upright position. INSTRUCTIONS FOR USE  1. Sit on the edge of your bed if possible, or sit up as far as you can in bed or on a chair. 2. Hold the incentive spirometer in an upright position. 3. Breathe out normally. 4. Place the mouthpiece in your mouth and seal your lips tightly around it. 5. Breathe in slowly and as deeply as possible, raising the piston or the ball toward the top of the column. 6. Hold your breath for 3-5 seconds or for as long as possible. Allow the piston or ball to fall to the bottom of the column. 7. Remove the mouthpiece from your mouth and breathe out normally. 8. Rest for a few seconds and repeat Steps 1 through 7 at least 10 times every 1-2 hours when you are awake. Take your time and take a few normal breaths between deep breaths. 9. The spirometer may include an indicator to show your best effort. Use the indicator as a goal to work toward during each repetition. 10. After each set of 10 deep breaths, practice coughing to be sure your lungs are clear. If you have an incision (the cut made at the time of surgery), support your incision when coughing by placing a pillow or rolled up towels firmly against it. Once you are able to get out of bed, walk around indoors and cough well. You may stop using the incentive spirometer when instructed by your caregiver.  RISKS AND COMPLICATIONS  Take your time so you do not get  dizzy or light-headed.  If you are in pain, you may need to take or ask for pain medication before doing incentive spirometry. It is harder to take a deep breath if you are having pain. AFTER USE  Rest and breathe slowly and easily.  It can be helpful to  keep track of a log of your progress. Your caregiver can provide you with a simple table to help with this. If you are using the spirometer at home, follow these instructions: [place] IF:   You are having difficultly using the spirometer.  You have trouble using the spirometer as often as instructed.  Your pain medication is not giving enough relief while using the spirometer.  You develop fever of 100.5 F (38.1 C) or higher. SEEK IMMEDIATE MEDICAL CARE IF:   You cough up bloody sputum that had not been present before.  You develop fever of 102 F (38.9 C) or greater.  You develop worsening pain at or near the incision site. MAKE SURE YOU:   Understand these instructions.  Will watch your condition.  Will get help right away if you are not doing well or get worse. Document Released: 03/14/2007 Document Revised: 01/24/2012 Document Reviewed: 05/15/2007 Exit[employer] Patient Information 2014 ExitCare, [place].   ________________________________________________________________________  WHAT IS A BLOOD TRANSFUSION? Blood Transfusion Information  A transfusion is the replacement of blood or some of its parts. Blood is made up of multiple cells which provide different functions.  Red blood cells carry oxygen and are used for blood loss replacement.  White blood cells fight against infection.  Platelets control bleeding.  Plasma helps clot blood.  Other blood products are available for specialized needs, such as hemophilia or other clotting disorders. BEFORE THE TRANSFUSION  Who gives blood for transfusions?   Healthy volunteers who are fully evaluated to make sure their blood is safe. This is blood bank blood. Transfusion therapy is the safest it has ever been in the practice of medicine. Before blood is taken from a donor, a complete history is taken to make sure that person has no history of diseases nor engages in risky social behavior (examples are intravenous drug  use or sexual activity with multiple partners). The donor's travel history is screened to minimize risk of transmitting infections, such as malaria. The donated blood is tested for signs of infectious diseases, such as HIV and hepatitis. The blood is then tested to be sure it is compatible with you in order to minimize the chance of a transfusion reaction. If you or a relative donates blood, this is often done in anticipation of surgery and is not appropriate for emergency situations. It takes many days to process the donated blood. RISKS AND COMPLICATIONS Although transfusion therapy is very safe and saves many lives, the main dangers of transfusion include:   Getting an infectious disease.  Developing a transfusion reaction. This is an allergic reaction to something in the blood you were given. Every precaution is taken to prevent this. The decision to have a blood transfusion has been considered carefully by your caregiver before blood is given. Blood is not given unless the benefits outweigh the risks. AFTER THE TRANSFUSION  Right after receiving a blood transfusion, you will usually feel much better and more energetic. This is especially true if your red blood cells have gotten low (anemic). The transfusion raises the level of the red blood cells which carry oxygen, and this usually causes an energy increase.  The nurse administering the transfusion will  monitor you carefully for complications. HOME CARE INSTRUCTIONS  No special instructions are needed after a transfusion. You may find your energy is better. Speak with your caregiver about any limitations on activity for underlying diseases you may have. SEEK MEDICAL CARE IF:   Your condition is not improving after your transfusion.  You develop redness or irritation at the intravenous (IV) site. SEEK IMMEDIATE MEDICAL CARE IF:  Any of the following symptoms occur over the next 12 hours:  Shaking chills.  You have a temperature by mouth  above 102 F (38.9 C), not controlled by medicine.  Chest, back, or muscle pain.  People around you feel you are not acting correctly or are confused.  Shortness of breath or difficulty breathing.  Dizziness and fainting.  You get a rash or develop hives.  You have a decrease in urine output.  Your urine turns a dark color or changes to pink, red, or brown. Any of the following symptoms occur over the next 10 days:  You have a temperature by mouth above 102 F (38.9 C), not controlled by medicine.  Shortness of breath.  Weakness after normal activity.  The white part of the eye turns yellow (jaundice).  You have a decrease in the amount of urine or are urinating less often.  Your urine turns a dark color or changes to pink, red, or brown. Document Released: 10/29/2000 Document Revised: 01/24/2012 Document Reviewed: 06/17/2008 [facility] Patient Information 2014 [place], [place].  _______________________________________________________________________

## 2018-04-03 NOTE — Progress Notes (Signed)
02/14/2018- noted in Epic- stress test  01/24/2018- noted in Epic-EKG  10/27/2017- noted in Epic- AAA Duplex  01/24/2018- noted in Epic-Clearance note from [name], NP  01/20/2018- Medical Clearance note on chart from Beaulah Dinning, NP

## 2018-04-04 NOTE — Telephone Encounter (Signed)
Sent to the pharmacy by e-scribe. 

## 2018-04-11 ENCOUNTER — Encounter (HOSPITAL_COMMUNITY): Payer: Self-pay

## 2018-04-11 ENCOUNTER — Other Ambulatory Visit: Payer: Self-pay

## 2018-04-11 ENCOUNTER — Encounter (HOSPITAL_COMMUNITY)
Admission: RE | Admit: 2018-04-11 | Discharge: 2018-04-11 | Disposition: A | Discharge status: Home / Self Care | Payer: Medicare HMO | Source: Ambulatory Visit | Attending: Orthopedic Surgery | Admitting: Orthopedic Surgery

## 2018-04-11 DIAGNOSIS — Z0183 Encounter for blood typing: Secondary | ICD-10-CM | POA: Insufficient documentation

## 2018-04-11 DIAGNOSIS — Z01812 Encounter for preprocedural laboratory examination: Secondary | ICD-10-CM | POA: Insufficient documentation

## 2018-04-11 DIAGNOSIS — M1611 Unilateral primary osteoarthritis, right hip: Secondary | ICD-10-CM | POA: Insufficient documentation

## 2018-04-11 HISTORY — DX: Unspecified osteoarthritis, unspecified site: M19.90

## 2018-04-11 HISTORY — DX: Acute myocardial infarction, unspecified: I21.9

## 2018-04-11 LAB — COMPREHENSIVE METABOLIC PANEL
ALT: 22 U/L (ref 17–63)
AST: 22 U/L (ref 15–41)
Albumin: 3.7 g/dL (ref 3.5–5.0)
Alkaline Phosphatase: 77 U/L (ref 38–126)
Anion gap: 10 (ref 5–15)
BUN: 21 mg/dL — ABNORMAL HIGH (ref 6–20)
CO2: 25 mmol/L (ref 22–32)
Calcium: 9 mg/dL (ref 8.9–10.3)
Chloride: 105 mmol/L (ref 101–111)
Creatinine, Ser: 1.19 mg/dL (ref 0.61–1.24)
GFR calc Af Amer: >60 mL/min (ref >60)
GFR calc non Af Amer: >60 mL/min (ref >60)
Glucose, Bld: 103 mg/dL — ABNORMAL HIGH (ref 65–99)
Potassium: 3.8 mmol/L (ref 3.5–5.1)
Sodium: 140 mmol/L (ref 135–145)
Total Bilirubin: 1.1 mg/dL (ref 0.3–1.2)
Total Protein: 6.5 g/dL (ref 6.5–8.1)

## 2018-04-11 LAB — APTT: aPTT: 29 seconds (ref 24–36)

## 2018-04-11 LAB — SURGICAL PCR SCREEN
MRSA, PCR: NEGATIVE
STAPHYLOCOCCUS AUREUS: NEGATIVE

## 2018-04-11 LAB — PROTIME-INR
INR: 1.06
Prothrombin Time: 13.7 seconds (ref 11.4–15.2)

## 2018-04-11 LAB — URINALYSIS, ROUTINE W REFLEX MICROSCOPIC
Bilirubin Urine: NEGATIVE
Glucose, UA: NEGATIVE mg/dL
Ketones, ur: NEGATIVE mg/dL
Leukocytes, UA: NEGATIVE
Nitrite: NEGATIVE
Protein, ur: NEGATIVE mg/dL
Specific Gravity, Urine: 1.013 (ref 1.005–1.030)
pH: 5 (ref 5.0–8.0)

## 2018-04-11 LAB — CBC
HCT: 44.2 % (ref 39.0–52.0)
Hemoglobin: 15.1 g/dL (ref 13.0–17.0)
MCH: 31.7 pg (ref 26.0–34.0)
MCHC: 34.2 g/dL (ref 30.0–36.0)
MCV: 92.9 fL (ref 78.0–100.0)
Platelets: 171 10*3/uL (ref 150–400)
RBC: 4.76 MIL/uL (ref 4.22–5.81)
RDW: 13.4 % (ref 11.5–15.5)
WBC: 7.2 10*3/uL (ref 4.0–10.5)

## 2018-04-11 LAB — ABO/RH: ABO/RH(D): O POS

## 2018-04-11 NOTE — Progress Notes (Signed)
   04/11/18 0939  OBSTRUCTIVE SLEEP APNEA  Have you ever been diagnosed with sleep apnea through a sleep study? No  Do you snore loudly (loud enough to be heard through closed doors)?  1  Do you often feel tired, fatigued, or sleepy during the daytime (such as falling asleep during driving or talking to someone)? 0  Has anyone observed you stop breathing during your sleep? 0  Do you have, or are you being treated for high blood pressure? 1  BMI more than 35 kg/m2? 1  Age > 50 (1-yes) 1  Neck circumference greater than:Male 16 inches or larger, Male 17inches or larger? 1  Male Gender (Yes=1) 1  Obstructive Sleep Apnea Score 6  Score 5 or greater  Results sent to PCP

## 2018-04-14 NOTE — H&P (Signed)
TOTAL HIP ADMISSION H&P  Patient is admitted for right total hip arthroplasty.  Subjective:  Chief Complaint: right hip pain  HPI: Dave Robles, 72 y.o. male, has a history of pain and functional disability in the right hip(s) due to arthritis and patient has failed non-surgical conservative treatments for greater than 12 weeks to include NSAID's and/or analgesics, flexibility and strengthening excercises and activity modification.  Onset of symptoms was gradual starting 5 years ago with gradually worsening course since that time.The patient noted no past surgery on the right hip(s).  Patient currently rates pain in the right hip at 7 out of 10 with activity. Patient has night pain, worsening of pain with activity and weight bearing, pain that interfers with activities of daily living and pain with passive range of motion. Patient has evidence of subchondral cysts, periarticular osteophytes and joint space narrowing by imaging studies. This condition presents safety issues increasing the risk of falls. There is no current active infection.  Patient Active Problem List   Diagnosis Date Noted  . AAA (abdominal aortic aneurysm) without rupture ([place]) 01/19/2017  . Pain in joint of left knee 01/19/2017  . Episodic mood disorder ([place]) 08/01/2014  . Personal history of colonic polyps 07/08/2014  . Onychomycosis 05/14/2014  . Hyperlipidemia 07/10/2007  . ERECTILE DYSFUNCTION 07/10/2007  . Essential hypertension 07/10/2007  . Coronary atherosclerosis 07/10/2007   Past Medical History:  Diagnosis Date  . AAA (abdominal aortic aneurysm) ([place])   . Adjustment disorder with depressed mood 03/04/2009  . Arthritis   . Colon polyps   . CORONARY ARTERY DISEASE 07/10/2007  . Depression   . ERECTILE DYSFUNCTION 07/10/2007  . Extrinsic asthma, unspecified 09/10/2008  . HYPERLIPIDEMIA 07/10/2007  . HYPERTENSION 07/10/2007  . Myocardial infarction [facility]) 2016   when had angioplasty- was told had a silent  MI  . TOBACCO USE 12/15/2009    Past Surgical History:  Procedure Laterality Date  . COLONOSCOPY    . CORONARY ANGIOPLASTY  12/2014  . CORONARY ARTERY BYPASS GRAFT  11/15/89   5 vessels  . HYDROCELE EXCISION / REPAIR  2011  . INGUINAL HERNIA REPAIR Left 1985       Current Outpatient Medications  Medication Sig Dispense Refill Last Dose  . acetaminophen (TYLENOL) 325 MG tablet Take 650 mg by mouth every 6 (six) hours as needed for moderate pain.     Marland Kitchen aspirin 81 MG tablet Take 81 mg by mouth daily.     Taking  . atorvastatin (LIPITOR) 80 MG tablet TAKE 1 TABLET (80 MG TOTAL) BY MOUTH DAILY. 90 tablet 2 Taking  . buPROPion (WELLBUTRIN SR) 150 MG 12 hr tablet Take 1 tablet (150 mg total) by mouth 2 (two) times daily. (Patient taking differently: Take 150 mg by mouth daily. ) 180 tablet 1 Taking  . clopidogrel (PLAVIX) 75 MG tablet Take 1 tablet (75 mg total) by mouth daily. 90 tablet 2 Taking  . diclofenac sodium (VOLTAREN) 1 % GEL Apply 1 application topically 4 (four) times daily.  1   . lisinopril-hydrochlorothiazide (PRINZIDE,ZESTORETIC) 20-12.5 MG tablet Take 1 tablet by mouth daily. 90 tablet 2 Taking  . metoprolol tartrate (LOPRESSOR) 25 MG tablet Take 1 tablet (25 mg total) by mouth 2 (two) times daily. 180 tablet 2 Taking  . Multiple Vitamin (MULTIVITAMIN) capsule Take 1 capsule by mouth daily.     Taking  . naproxen sodium (ALEVE) 220 MG tablet Take 220 mg by mouth daily as needed.     . cyclobenzaprine (  FLEXERIL) 10 MG tablet TAKE 1 TABLET BY MOUTH NIGHTLY AT BEDTIME AS NEEDED FOR MUSCLE SPASM 90 tablet 0   . nitroGLYCERIN (NITROSTAT) 0.4 MG SL tablet Place 1 tablet (0.4 mg total) under the tongue every 5 (five) minutes as needed. 25 tablet 1 Taking  . sildenafil (VIAGRA) 100 MG tablet Take 1 tablet (100 mg total) by mouth daily as needed. (Patient taking differently: Take 100 mg by mouth daily as needed for erectile dysfunction. ) 10 tablet 11 Taking   No Known Allergies  Social  History   Tobacco Use  . Smoking status: Former Smoker    Packs/day: 0.50    Last attempt to quit: 01/13/2010    Years since quitting: 8.2  . Smokeless tobacco: Never Used  Substance Use Topics  . Alcohol use: Yes    Alcohol/week: 3.0 oz    Types: 5 Cans of beer per week    Family History  Problem Relation Age of Onset  . Coronary artery disease Father        cabg  . Cancer Mother        Breast and or lung   . Depression Mother   . Hyperlipidemia Other   . Hypertension Other   . Depression Paternal Grandfather   . Colon cancer Neg Hx      Review of Systems  Constitutional: Negative.   HENT: Negative.   Eyes: Negative.   Respiratory: Negative.   Cardiovascular: Negative.   Gastrointestinal: Negative.   Genitourinary: Negative.   Musculoskeletal: Positive for joint pain and myalgias. Negative for back pain, falls and neck pain.  Skin: Negative.   Neurological: Negative.   Endo/Heme/Allergies: Negative.   Psychiatric/Behavioral: Negative.     Objective:  Physical Exam  Constitutional: He is oriented to person, place, and time. He appears well-developed. No distress.  Obese  HENT:  Head: Normocephalic and atraumatic.  Right Ear: External ear normal.  Left Ear: External ear normal.  Nose: Nose normal.  Mouth/Throat: Oropharynx is clear and moist.  Eyes: Conjunctivae and EOM are normal.  Neck: Normal range of motion. Neck supple.  Cardiovascular: Normal rate, regular rhythm, normal heart sounds and intact distal pulses.  No murmur heard. Respiratory: Effort normal and breath sounds normal. No respiratory distress. He has no wheezes.  GI: Soft. Bowel sounds are normal. He exhibits no distension. There is no tenderness.  Musculoskeletal:  Left Hip Exam: ROM: Flexion 120, IR to 30 degrees, ER to 40 degrees and abduction over 45 degrees. There is no tenderness over the greater trochanter.  There is no pain on provocative testing of the hip.  Right Hip Exam: ROM:  Flexion to 105, IR to 0 degrees, ER to about 25, and abduction 20 without discomfort. He has lost rotation in comparison to prior visits. There is no tenderness over the greater trochanter.  There is no pain on provocative testing of the hip.  Left Knee Exam:  No effusion.  Range of motion is 0-135 degrees.  There is no crepitus on range of motion of the knee.  There is minimal medial joint line tenderness. Positive lateral joint line tenderness.  There is no instability noted  Right Knee Exam:  No effusion.  Range of motion is 0-135 degrees.  There is no crepitus on range of motion of the knee.  There is no medial or lateral joint line tenderness.  There is no instability noted  Neurological: He is alert and oriented to person, place, and time. He has normal strength.  No sensory deficit.  Skin: No rash noted. He is not diaphoretic. No erythema.  Psychiatric: He has a normal mood and affect. His behavior is normal.     Ht: 5 ft 9 in  Wt: 240 lbs  BMI: 35.4  BP: 116/68 sitting L arm  Pulse: 72 bpm regular   Imaging Review Plain radiographs demonstrate severe degenerative joint disease of the right hip(s). The bone quality appears to be good for age and reported activity level.    Preoperative templating of the joint replacement has been completed, documented, and submitted to the Operating Room personnel in order to optimize intra-operative equipment management.     Assessment/Plan:  End stage primary osteoarthritis, right hip(s)  The patient history, physical examination, clinical judgement of the provider and imaging studies are consistent with end stage degenerative joint disease of the right hip(s) and total hip arthroplasty is deemed medically necessary. The treatment options including medical management, injection therapy, arthroscopy and arthroplasty were discussed at length. The risks and benefits of total hip arthroplasty were presented and reviewed. The risks due  to aseptic loosening, infection, stiffness, dislocation/subluxation,  thromboembolic complications and other imponderables were discussed.  The patient acknowledged the explanation, agreed to proceed with the plan and consent was signed. Patient is being admitted for inpatient treatment for surgery, pain control, PT, OT, prophylactic antibiotics, VTE prophylaxis, progressive ambulation and ADL's and discharge planning.The patient is planning to be discharged home   Therapy Plans: HHPT vs HEP Disposition: Home with wife Planned DVT prophylaxis: resume Plavix DME needed: has equipment PCP: Dr. Georgina Snell at [place]: Abbie Sons Other: topical [address], PA-C

## 2018-04-18 MED ORDER — TRANEXAMIC ACID 1000 MG/10ML IV SOLN
2000.0000 mg | INTRAVENOUS | Status: DC
  Filled 2018-04-18: qty 20

## 2018-04-18 MED ORDER — SODIUM CHLORIDE 0.9 % IV SOLN
2000.0000 mg | INTRAVENOUS | Status: DC
  Filled 2018-04-18: qty 20

## 2018-04-19 ENCOUNTER — Encounter (HOSPITAL_COMMUNITY): Payer: Self-pay | Admitting: Anesthesiology

## 2018-04-19 ENCOUNTER — Other Ambulatory Visit: Payer: Self-pay

## 2018-04-19 ENCOUNTER — Encounter (HOSPITAL_COMMUNITY)
Admission: RE | Disposition: A | Discharge status: Home Health | Payer: Self-pay | Source: Home / Self Care | Attending: Orthopedic Surgery

## 2018-04-19 ENCOUNTER — Inpatient Hospital Stay (HOSPITAL_COMMUNITY): Payer: Medicare HMO | Admitting: Anesthesiology

## 2018-04-19 ENCOUNTER — Inpatient Hospital Stay (HOSPITAL_COMMUNITY)
Admission: RE | Admit: 2018-04-19 | Discharge: 2018-04-21 | DRG: 470 | Disposition: A | Discharge status: Home Health | Payer: Medicare HMO | Attending: Orthopedic Surgery | Admitting: Orthopedic Surgery

## 2018-04-19 ENCOUNTER — Inpatient Hospital Stay (HOSPITAL_COMMUNITY): Payer: Medicare HMO

## 2018-04-19 DIAGNOSIS — Z6835 Body mass index (BMI) 35.0-35.9, adult: Secondary | ICD-10-CM

## 2018-04-19 DIAGNOSIS — I2510 Atherosclerotic heart disease of native coronary artery without angina pectoris: Secondary | ICD-10-CM | POA: Diagnosis present

## 2018-04-19 DIAGNOSIS — Z818 Family history of other mental and behavioral disorders: Secondary | ICD-10-CM

## 2018-04-19 DIAGNOSIS — E785 Hyperlipidemia, unspecified: Secondary | ICD-10-CM | POA: Diagnosis present

## 2018-04-19 DIAGNOSIS — E669 Obesity, unspecified: Secondary | ICD-10-CM | POA: Diagnosis present

## 2018-04-19 DIAGNOSIS — J45909 Unspecified asthma, uncomplicated: Secondary | ICD-10-CM | POA: Diagnosis present

## 2018-04-19 DIAGNOSIS — M169 Osteoarthritis of hip, unspecified: Secondary | ICD-10-CM

## 2018-04-19 DIAGNOSIS — I252 Old myocardial infarction: Secondary | ICD-10-CM | POA: Diagnosis not present

## 2018-04-19 DIAGNOSIS — Z791 Long term (current) use of non-steroidal anti-inflammatories (NSAID): Secondary | ICD-10-CM

## 2018-04-19 DIAGNOSIS — Z7902 Long term (current) use of antithrombotics/antiplatelets: Secondary | ICD-10-CM

## 2018-04-19 DIAGNOSIS — Z7982 Long term (current) use of aspirin: Secondary | ICD-10-CM

## 2018-04-19 DIAGNOSIS — I10 Essential (primary) hypertension: Secondary | ICD-10-CM | POA: Diagnosis not present

## 2018-04-19 DIAGNOSIS — Z8249 Family history of ischemic heart disease and other diseases of the circulatory system: Secondary | ICD-10-CM | POA: Diagnosis not present

## 2018-04-19 DIAGNOSIS — I714 Abdominal aortic aneurysm, without rupture: Secondary | ICD-10-CM | POA: Diagnosis present

## 2018-04-19 DIAGNOSIS — Z86010 Personal history of colonic polyps: Secondary | ICD-10-CM | POA: Diagnosis not present

## 2018-04-19 DIAGNOSIS — Z79899 Other long term (current) drug therapy: Secondary | ICD-10-CM | POA: Diagnosis not present

## 2018-04-19 DIAGNOSIS — M1611 Unilateral primary osteoarthritis, right hip: Secondary | ICD-10-CM | POA: Diagnosis not present

## 2018-04-19 DIAGNOSIS — Z809 Family history of malignant neoplasm, unspecified: Secondary | ICD-10-CM | POA: Diagnosis not present

## 2018-04-19 DIAGNOSIS — Z471 Aftercare following joint replacement surgery: Secondary | ICD-10-CM | POA: Diagnosis not present

## 2018-04-19 DIAGNOSIS — Z96649 Presence of unspecified artificial hip joint: Secondary | ICD-10-CM

## 2018-04-19 DIAGNOSIS — N529 Male erectile dysfunction, unspecified: Secondary | ICD-10-CM | POA: Diagnosis present

## 2018-04-19 DIAGNOSIS — M25751 Osteophyte, right hip: Secondary | ICD-10-CM | POA: Diagnosis present

## 2018-04-19 DIAGNOSIS — Z9861 Coronary angioplasty status: Secondary | ICD-10-CM | POA: Diagnosis not present

## 2018-04-19 DIAGNOSIS — Z951 Presence of aortocoronary bypass graft: Secondary | ICD-10-CM | POA: Diagnosis not present

## 2018-04-19 DIAGNOSIS — Z87891 Personal history of nicotine dependence: Secondary | ICD-10-CM

## 2018-04-19 DIAGNOSIS — Z96641 Presence of right artificial hip joint: Secondary | ICD-10-CM | POA: Diagnosis not present

## 2018-04-19 HISTORY — PX: TOTAL HIP ARTHROPLASTY: SHX124

## 2018-04-19 HISTORY — DX: Osteoarthritis of hip, unspecified: M16.9

## 2018-04-19 LAB — TYPE AND SCREEN
ABO/RH(D): O POS
Antibody Screen: NEGATIVE

## 2018-04-19 SURGERY — ARTHROPLASTY, HIP, TOTAL, ANTERIOR APPROACH
Anesthesia: Spinal | Site: Hip | Laterality: Right

## 2018-04-19 MED ORDER — EPHEDRINE SULFATE 50 MG/ML IJ SOLN
INTRAMUSCULAR | Status: AC
  Filled 2018-04-19: qty 1

## 2018-04-19 MED ORDER — ACETAMINOPHEN 325 MG PO TABS
325.0000 mg | ORAL_TABLET | Freq: Four times a day (QID) | ORAL | Status: DC | PRN
  Administered 2018-04-20: 650 mg via ORAL
  Filled 2018-04-19: qty 2

## 2018-04-19 MED ORDER — ONDANSETRON HCL 4 MG PO TABS: 4.0000 mg | ORAL_TABLET | Freq: Four times a day (QID) | ORAL | Status: DC | PRN

## 2018-04-19 MED ORDER — LACTATED RINGERS IV SOLN
INTRAVENOUS | Status: DC
  Administered 2018-04-19 (×2): via INTRAVENOUS

## 2018-04-19 MED ORDER — FENTANYL CITRATE (PF) 100 MCG/2ML IJ SOLN
INTRAMUSCULAR | Status: DC | PRN
  Administered 2018-04-19: 75 ug via INTRAVENOUS

## 2018-04-19 MED ORDER — ACETAMINOPHEN 500 MG PO TABS
1000.0000 mg | ORAL_TABLET | Freq: Four times a day (QID) | ORAL | Status: AC
  Administered 2018-04-19 – 2018-04-20 (×4): 1000 mg via ORAL
  Filled 2018-04-19 (×4): qty 2

## 2018-04-19 MED ORDER — CHLORHEXIDINE GLUCONATE 4 % EX LIQD: 60.0000 mL | Freq: Once | CUTANEOUS | Status: DC

## 2018-04-19 MED ORDER — METOPROLOL TARTRATE 25 MG PO TABS
25.0000 mg | ORAL_TABLET | Freq: Two times a day (BID) | ORAL | Status: DC
  Administered 2018-04-19 – 2018-04-21 (×4): 25 mg via ORAL
  Filled 2018-04-19 (×4): qty 1

## 2018-04-19 MED ORDER — EPHEDRINE SULFATE 50 MG/ML IJ SOLN
INTRAMUSCULAR | Status: DC | PRN
  Administered 2018-04-19 (×5): 5 mg via INTRAVENOUS
  Administered 2018-04-19 (×5): 10 mg via INTRAVENOUS
  Administered 2018-04-19: 5 mg via INTRAVENOUS

## 2018-04-19 MED ORDER — METHOCARBAMOL 1000 MG/10ML IJ SOLN
500.0000 mg | Freq: Four times a day (QID) | INTRAVENOUS | Status: DC | PRN
  Administered 2018-04-20: 500 mg via INTRAVENOUS
  Filled 2018-04-19: qty 550

## 2018-04-19 MED ORDER — ONDANSETRON HCL 4 MG/2ML IJ SOLN
INTRAMUSCULAR | Status: AC
  Filled 2018-04-19: qty 4

## 2018-04-19 MED ORDER — CYCLOBENZAPRINE HCL 10 MG PO TABS
10.0000 mg | ORAL_TABLET | Freq: Every day | ORAL | Status: DC
  Administered 2018-04-19 – 2018-04-20 (×2): 10 mg via ORAL
  Filled 2018-04-19 (×2): qty 1

## 2018-04-19 MED ORDER — BISACODYL 10 MG RE SUPP: 10.0000 mg | Freq: Every day | RECTAL | Status: DC | PRN

## 2018-04-19 MED ORDER — CEFAZOLIN SODIUM-DEXTROSE 2-4 GM/100ML-% IV SOLN
INTRAVENOUS | Status: AC
  Filled 2018-04-19: qty 100

## 2018-04-19 MED ORDER — ASPIRIN 81 MG PO CHEW
81.0000 mg | CHEWABLE_TABLET | Freq: Every day | ORAL | Status: DC
  Administered 2018-04-20 – 2018-04-21 (×2): 81 mg via ORAL
  Filled 2018-04-19 (×2): qty 1

## 2018-04-19 MED ORDER — ONDANSETRON HCL 4 MG/2ML IJ SOLN: 4.0000 mg | Freq: Four times a day (QID) | INTRAMUSCULAR | Status: DC | PRN

## 2018-04-19 MED ORDER — ROCURONIUM BROMIDE 10 MG/ML (PF) SYRINGE
PREFILLED_SYRINGE | INTRAVENOUS | Status: AC
  Filled 2018-04-19: qty 5

## 2018-04-19 MED ORDER — SODIUM CHLORIDE 0.9 % IJ SOLN
INTRAMUSCULAR | Status: AC
  Filled 2018-04-19: qty 10

## 2018-04-19 MED ORDER — MEPERIDINE HCL 50 MG/ML IJ SOLN: 6.2500 mg | INTRAMUSCULAR | Status: DC | PRN

## 2018-04-19 MED ORDER — PROPOFOL 500 MG/50ML IV EMUL
INTRAVENOUS | Status: DC | PRN
  Administered 2018-04-19: 95 ug/kg/min via INTRAVENOUS

## 2018-04-19 MED ORDER — BUPIVACAINE IN DEXTROSE 0.75-8.25 % IT SOLN
INTRATHECAL | Status: DC | PRN
  Administered 2018-04-19: 2 mL via INTRATHECAL

## 2018-04-19 MED ORDER — DEXAMETHASONE SODIUM PHOSPHATE 10 MG/ML IJ SOLN
INTRAMUSCULAR | Status: AC
  Filled 2018-04-19: qty 2

## 2018-04-19 MED ORDER — SODIUM CHLORIDE 0.9 % IV SOLN
INTRAVENOUS | Status: DC
  Administered 2018-04-19 – 2018-04-20 (×3): via INTRAVENOUS

## 2018-04-19 MED ORDER — DIPHENHYDRAMINE HCL 12.5 MG/5ML PO ELIX: 12.5000 mg | ORAL_SOLUTION | ORAL | Status: DC | PRN

## 2018-04-19 MED ORDER — DEXAMETHASONE SODIUM PHOSPHATE 10 MG/ML IJ SOLN
8.0000 mg | Freq: Once | INTRAMUSCULAR | Status: AC
  Administered 2018-04-19: 10 mg via INTRAVENOUS

## 2018-04-19 MED ORDER — ATORVASTATIN CALCIUM 80 MG PO TABS
80.0000 mg | ORAL_TABLET | Freq: Every morning | ORAL | Status: DC
  Administered 2018-04-20 – 2018-04-21 (×2): 80 mg via ORAL
  Filled 2018-04-19: qty 2
  Filled 2018-04-19: qty 1
  Filled 2018-04-19: qty 2
  Filled 2018-04-19: qty 1

## 2018-04-19 MED ORDER — POLYETHYLENE GLYCOL 3350 17 G PO PACK: 17.0000 g | PACK | Freq: Every day | ORAL | Status: DC | PRN

## 2018-04-19 MED ORDER — PROPOFOL 10 MG/ML IV BOLUS
INTRAVENOUS | Status: AC
  Filled 2018-04-19: qty 20

## 2018-04-19 MED ORDER — GLYCOPYRROLATE 0.2 MG/ML IV SOSY
PREFILLED_SYRINGE | INTRAVENOUS | Status: AC
  Filled 2018-04-19: qty 10

## 2018-04-19 MED ORDER — OXYCODONE HCL 5 MG PO TABS
5.0000 mg | ORAL_TABLET | ORAL | Status: DC | PRN
  Administered 2018-04-19: 5 mg via ORAL
  Administered 2018-04-21 (×2): 10 mg via ORAL
  Filled 2018-04-19 (×2): qty 2
  Filled 2018-04-19: qty 1

## 2018-04-19 MED ORDER — OXYCODONE HCL 5 MG PO TABS
10.0000 mg | ORAL_TABLET | ORAL | Status: DC | PRN
  Administered 2018-04-19: 10 mg via ORAL
  Administered 2018-04-19: 15 mg via ORAL
  Administered 2018-04-20: 10 mg via ORAL
  Administered 2018-04-20 – 2018-04-21 (×4): 15 mg via ORAL
  Filled 2018-04-19 (×3): qty 3
  Filled 2018-04-19: qty 2
  Filled 2018-04-19 (×3): qty 3

## 2018-04-19 MED ORDER — HYDROMORPHONE HCL 1 MG/ML IJ SOLN: 0.2500 mg | INTRAMUSCULAR | Status: DC | PRN

## 2018-04-19 MED ORDER — TRAMADOL HCL 50 MG PO TABS
50.0000 mg | ORAL_TABLET | Freq: Four times a day (QID) | ORAL | Status: DC | PRN
  Administered 2018-04-19 – 2018-04-20 (×2): 100 mg via ORAL
  Administered 2018-04-20: 50 mg via ORAL
  Administered 2018-04-21: 100 mg via ORAL
  Filled 2018-04-19: qty 2
  Filled 2018-04-19: qty 1
  Filled 2018-04-19 (×2): qty 2

## 2018-04-19 MED ORDER — ACETAMINOPHEN 10 MG/ML IV SOLN
1000.0000 mg | Freq: Once | INTRAVENOUS | Status: AC
  Administered 2018-04-19: 1000 mg via INTRAVENOUS

## 2018-04-19 MED ORDER — HYDROMORPHONE HCL 1 MG/ML IJ SOLN
0.5000 mg | INTRAMUSCULAR | Status: DC | PRN
  Administered 2018-04-21: 1 mg via INTRAVENOUS
  Filled 2018-04-19: qty 1

## 2018-04-19 MED ORDER — METOCLOPRAMIDE HCL 5 MG PO TABS: 5.0000 mg | ORAL_TABLET | Freq: Three times a day (TID) | ORAL | Status: DC | PRN

## 2018-04-19 MED ORDER — ACETAMINOPHEN 10 MG/ML IV SOLN
INTRAVENOUS | Status: AC
  Filled 2018-04-19: qty 100

## 2018-04-19 MED ORDER — METOCLOPRAMIDE HCL 5 MG/ML IJ SOLN: 5.0000 mg | Freq: Three times a day (TID) | INTRAMUSCULAR | Status: DC | PRN

## 2018-04-19 MED ORDER — FENTANYL CITRATE (PF) 100 MCG/2ML IJ SOLN
INTRAMUSCULAR | Status: AC
  Filled 2018-04-19: qty 2

## 2018-04-19 MED ORDER — PHENYLEPHRINE HCL 10 MG/ML IJ SOLN
INTRAVENOUS | Status: DC | PRN
  Administered 2018-04-19: 75 ug/min via INTRAVENOUS

## 2018-04-19 MED ORDER — ONDANSETRON HCL 4 MG/2ML IJ SOLN
INTRAMUSCULAR | Status: DC | PRN
  Administered 2018-04-19: 4 mg via INTRAVENOUS

## 2018-04-19 MED ORDER — NITROGLYCERIN 0.4 MG SL SUBL: 0.4000 mg | SUBLINGUAL_TABLET | SUBLINGUAL | Status: DC | PRN

## 2018-04-19 MED ORDER — SODIUM CHLORIDE 0.9 % IV SOLN
1000.0000 mg | Freq: Once | INTRAVENOUS | Status: AC
  Administered 2018-04-19: 1000 mg via INTRAVENOUS
  Filled 2018-04-19: qty 1100

## 2018-04-19 MED ORDER — MIDAZOLAM HCL 2 MG/2ML IJ SOLN
INTRAMUSCULAR | Status: AC
  Filled 2018-04-19: qty 2

## 2018-04-19 MED ORDER — PHENYLEPHRINE HCL 10 MG/ML IJ SOLN
INTRAMUSCULAR | Status: AC
  Filled 2018-04-19: qty 1

## 2018-04-19 MED ORDER — PROPOFOL 10 MG/ML IV BOLUS
INTRAVENOUS | Status: AC
  Filled 2018-04-19: qty 60

## 2018-04-19 MED ORDER — PHENOL 1.4 % MT LIQD: 1.0000 | OROMUCOSAL | Status: DC | PRN

## 2018-04-19 MED ORDER — BUPIVACAINE-EPINEPHRINE (PF) 0.25% -1:200000 IJ SOLN
INTRAMUSCULAR | Status: DC | PRN
  Administered 2018-04-19: 30 mL via PERINEURAL

## 2018-04-19 MED ORDER — ALBUMIN HUMAN 5 % IV SOLN
INTRAVENOUS | Status: DC | PRN
  Administered 2018-04-19: 10:00:00 via INTRAVENOUS

## 2018-04-19 MED ORDER — CEFAZOLIN SODIUM-DEXTROSE 2-4 GM/100ML-% IV SOLN
2.0000 g | INTRAVENOUS | Status: AC
  Administered 2018-04-19: 2 g via INTRAVENOUS

## 2018-04-19 MED ORDER — GLYCOPYRROLATE 0.2 MG/ML IJ SOLN: INTRAMUSCULAR | Status: DC | PRN

## 2018-04-19 MED ORDER — CEFAZOLIN SODIUM-DEXTROSE 2-4 GM/100ML-% IV SOLN
2.0000 g | Freq: Four times a day (QID) | INTRAVENOUS | Status: AC
  Administered 2018-04-19 (×2): 2 g via INTRAVENOUS
  Filled 2018-04-19 (×2): qty 100

## 2018-04-19 MED ORDER — ONDANSETRON HCL 4 MG/2ML IJ SOLN: 4.0000 mg | Freq: Once | INTRAMUSCULAR | Status: DC | PRN

## 2018-04-19 MED ORDER — PROPOFOL 10 MG/ML IV BOLUS
INTRAVENOUS | Status: AC
  Filled 2018-04-19: qty 40

## 2018-04-19 MED ORDER — MIDAZOLAM HCL 5 MG/5ML IJ SOLN
INTRAMUSCULAR | Status: DC | PRN
  Administered 2018-04-19: 2 mg via INTRAVENOUS

## 2018-04-19 MED ORDER — BUPIVACAINE-EPINEPHRINE (PF) 0.25% -1:200000 IJ SOLN
INTRAMUSCULAR | Status: AC
  Filled 2018-04-19: qty 30

## 2018-04-19 MED ORDER — CLOPIDOGREL BISULFATE 75 MG PO TABS
75.0000 mg | ORAL_TABLET | Freq: Every day | ORAL | Status: DC
  Administered 2018-04-20 – 2018-04-21 (×2): 75 mg via ORAL
  Filled 2018-04-19 (×2): qty 1

## 2018-04-19 MED ORDER — DOCUSATE SODIUM 100 MG PO CAPS
100.0000 mg | ORAL_CAPSULE | Freq: Two times a day (BID) | ORAL | Status: DC
  Administered 2018-04-19 – 2018-04-21 (×4): 100 mg via ORAL
  Filled 2018-04-19 (×4): qty 1

## 2018-04-19 MED ORDER — MENTHOL 3 MG MT LOZG: 1.0000 | LOZENGE | OROMUCOSAL | Status: DC | PRN

## 2018-04-19 MED ORDER — ALBUMIN HUMAN 5 % IV SOLN
INTRAVENOUS | Status: AC
  Filled 2018-04-19: qty 500

## 2018-04-19 MED ORDER — FLEET ENEMA 7-19 GM/118ML RE ENEM: 1.0000 | ENEMA | Freq: Once | RECTAL | Status: DC | PRN

## 2018-04-19 MED ORDER — METHOCARBAMOL 500 MG PO TABS
500.0000 mg | ORAL_TABLET | Freq: Four times a day (QID) | ORAL | Status: DC | PRN
  Administered 2018-04-19 – 2018-04-21 (×5): 500 mg via ORAL
  Filled 2018-04-19 (×5): qty 1

## 2018-04-19 MED ORDER — DEXAMETHASONE SODIUM PHOSPHATE 10 MG/ML IJ SOLN
10.0000 mg | Freq: Once | INTRAMUSCULAR | Status: AC
  Administered 2018-04-20: 10 mg via INTRAVENOUS
  Filled 2018-04-19: qty 1

## 2018-04-19 MED ORDER — GLYCOPYRROLATE PF 0.2 MG/ML IJ SOSY
PREFILLED_SYRINGE | INTRAMUSCULAR | Status: DC | PRN
  Administered 2018-04-19: .2 mg via INTRAVENOUS

## 2018-04-19 MED ORDER — BUPROPION HCL ER (SR) 150 MG PO TB12
150.0000 mg | ORAL_TABLET | Freq: Every day | ORAL | Status: DC
  Administered 2018-04-20 – 2018-04-21 (×2): 150 mg via ORAL
  Filled 2018-04-19 (×2): qty 1

## 2018-04-19 SURGICAL SUPPLY — 41 items
BAG DECANTER FOR FLEXI CONT (MISCELLANEOUS) ×2 IMPLANT
BAG SPEC THK2 15X12 ZIP CLS (MISCELLANEOUS)
BAG ZIPLOCK 12X15 (MISCELLANEOUS) IMPLANT
BLADE EXTENDED COATED 6.5IN (ELECTRODE) ×2 IMPLANT
BLADE SAG 18X100X1.27 (BLADE) ×2 IMPLANT
CAPT HIP TOTAL 2 ×1 IMPLANT
CLOTH BEACON ORANGE TIMEOUT ST (SAFETY) ×2 IMPLANT
COVER PERINEAL POST (MISCELLANEOUS) ×2 IMPLANT
COVER SURGICAL LIGHT HANDLE (MISCELLANEOUS) ×2 IMPLANT
DECANTER SPIKE VIAL GLASS SM (MISCELLANEOUS) ×2 IMPLANT
DRAPE STERI IOBAN 125X83 (DRAPES) ×2 IMPLANT
DRAPE U-SHAPE 47X51 STRL (DRAPES) ×4 IMPLANT
DRSG ADAPTIC 3X8 NADH LF (GAUZE/BANDAGES/DRESSINGS) ×2 IMPLANT
DRSG MEPILEX BORDER 4X4 (GAUZE/BANDAGES/DRESSINGS) ×2 IMPLANT
DRSG MEPILEX BORDER 4X8 (GAUZE/BANDAGES/DRESSINGS) ×2 IMPLANT
DURAPREP 26ML APPLICATOR (WOUND CARE) ×2 IMPLANT
ELECT REM PT RETURN 15FT ADLT (MISCELLANEOUS) ×2 IMPLANT
EVACUATOR 1/8 PVC DRAIN (DRAIN) ×2 IMPLANT
GLOVE BIO SURGEON STRL SZ8 (GLOVE) ×4 IMPLANT
GLOVE BIOGEL PI IND STRL 6.5 (GLOVE) ×1 IMPLANT
GLOVE BIOGEL PI IND STRL 7.0 (GLOVE) ×1 IMPLANT
GLOVE BIOGEL PI IND STRL 8 (GLOVE) ×2 IMPLANT
GLOVE BIOGEL PI INDICATOR 6.5 (GLOVE) ×1
GLOVE BIOGEL PI INDICATOR 7.0 (GLOVE) ×1
GLOVE BIOGEL PI INDICATOR 8 (GLOVE) ×2
GLOVE SURG SS PI 6.5 STRL IVOR (GLOVE) ×2 IMPLANT
GLOVE SURG SS PI 7.0 STRL IVOR (GLOVE) ×2 IMPLANT
GOWN STRL REUS W/TWL LRG LVL3 (GOWN DISPOSABLE) ×4 IMPLANT
GOWN STRL REUS W/TWL XL LVL3 (GOWN DISPOSABLE) ×2 IMPLANT
PACK ANTERIOR HIP CUSTOM (KITS) ×2 IMPLANT
SHIELD SPLASH 9X12 PIC/PGM (MISCELLANEOUS) ×1 IMPLANT
STRIP CLOSURE SKIN 1/2X4 (GAUZE/BANDAGES/DRESSINGS) ×2 IMPLANT
SUT ETHIBOND NAB CT1 #1 30IN (SUTURE) ×2 IMPLANT
SUT MNCRL AB 4-0 PS2 18 (SUTURE) ×2 IMPLANT
SUT STRATAFIX 0 PDS 27 VIOLET (SUTURE) ×2
SUT VIC AB 2-0 CT1 27 (SUTURE) ×4
SUT VIC AB 2-0 CT1 TAPERPNT 27 (SUTURE) ×2 IMPLANT
SUTURE STRATFX 0 PDS 27 VIOLET (SUTURE) ×1 IMPLANT
SYR 50ML LL SCALE MARK (SYRINGE) IMPLANT
TRAY FOLEY MTR SLVR 16FR STAT (SET/KITS/TRAYS/PACK) ×2 IMPLANT
YANKAUER SUCT BULB TIP 10FT TU (MISCELLANEOUS) ×2 IMPLANT

## 2018-04-19 NOTE — Anesthesia Procedure Notes (Signed)
Spinal  Patient location during procedure: OR Start time: 04/19/2018 9:32 AM Staffing Anesthesiologist: Josephine Igo, MD Performed: anesthesiologist  Preanesthetic Checklist Completed: patient identified, site marked, surgical consent, pre-op evaluation, timeout performed, IV checked, risks and benefits discussed and monitors and equipment checked Spinal Block Patient position: sitting Prep: site prepped and draped and DuraPrep Patient monitoring: heart rate, cardiac monitor, continuous pulse ox and blood pressure Approach: midline Location: L3-4 Injection technique: single-shot Needle Needle type: Pencan  Needle gauge: 24 G Needle length: 9 cm Needle insertion depth: 7 cm Assessment Sensory level: T6 Additional Notes Patient tolerated procedure well. Adequate sensory level.

## 2018-04-19 NOTE — Interval H&P Note (Signed)
History and Physical Interval Note:  04/19/2018 7:43 AM  Dave Robles  has presented today for surgery, with the diagnosis of Osteoarthritis right hip  The various methods of treatment have been discussed with the patient and family. After consideration of risks, benefits and other options for treatment, the patient has consented to  Procedure(s): RIGHT TOTAL HIP ARTHROPLASTY ANTERIOR APPROACH (Right) as a surgical intervention .  The patient's history has been reviewed, patient examined, no change in status, stable for surgery.  I have reviewed the patient's chart and labs.  Questions were answered to the patient's satisfaction.     Pilar Plate [name]

## 2018-04-19 NOTE — Evaluation (Signed)
Physical Therapy Evaluation Patient Details Name: Dave Robles MRN: [id number] DOB: 03/19/1946 Today's Date: 04/19/2018   History of Present Illness  Patient is a 72 y/o male admitted for R direct anterior THA.    Clinical Impression  Patient presents with decreased independence with mobility due to pain and decreased AROM.  Currently mod A for bed mobility and transfers, min a short distance ambulation.  He will benefit from skilled PT in the acute setting to allow return home with family support.      Follow Up Recommendations Follow surgeon's recommendation for DC plan and follow-up therapies    Equipment Recommendations  None recommended by PT    Recommendations for Other Services       Precautions / Restrictions Precautions Precautions: Fall      Mobility  Bed Mobility Overal bed mobility: Needs Assistance Bed Mobility: Supine to Sit     Supine to sit: HOB elevated;Mod assist     General bed mobility comments: assist for R LE and to lift trunk cues for positioning to use railing  Transfers Overall transfer level: Needs assistance Equipment used: Rolling walker (2 wheeled) Transfers: Sit to/from Stand Sit to Stand: Mod assist;+2 safety/equipment         General transfer comment: lifting assist from EOB  Ambulation/Gait Ambulation/Gait assistance: Min assist;+2 safety/equipment Ambulation Distance (Feet): 12 Feet Assistive device: Rolling walker (2 wheeled) Gait Pattern/deviations: Step-to pattern;Antalgic;Decreased stride length     General Gait Details: cues for sequence, use of walker due to pain and pt limited gait due to pain with weight bearing, chair followed  Stairs            Wheelchair Mobility    Modified Rankin (Stroke Patients Only)       Balance Overall balance assessment: Needs assistance   Sitting balance-Leahy Scale: Good     Standing balance support: Bilateral upper extremity supported   Standing balance comment: UE  support in standing                             Pertinent Vitals/Pain Pain Assessment: 0-10 Pain Score: 3  Pain Location: side of right hip Pain Descriptors / Indicators: Aching Pain Intervention(s): Monitored during session;Repositioned    Home Living Family/patient expects to be discharged to:: Private residence Living Arrangements: Spouse/significant other Available Help at Discharge: Family Type of Home: House Home Access: Stairs to enter Entrance Stairs-Rails: Right Entrance Stairs-Number of Steps: 4 Home Layout: Two level;Able to live on main level with bedroom/bathroom Home Equipment: Shower seat - built in      Prior Function Level of Independence: Independent               Hand Dominance   Dominant Hand: Right    Extremity/Trunk Assessment   Upper Extremity Assessment Upper Extremity Assessment: Overall WFL for tasks assessed    Lower Extremity Assessment Lower Extremity Assessment: RLE deficits/detail RLE Deficits / Details: limited by pain, but able to perform heel slides with assist and quad sets       Communication   Communication: No difficulties  Cognition Arousal/Alertness: Awake/alert Behavior During Therapy: WFL for tasks assessed/performed Overall Cognitive Status: Within Functional Limits for tasks assessed                                        General Comments  Exercises Total Joint Exercises Ankle Circles/Pumps: 10 reps;AROM;Supine;Both Quad Sets: AROM;10 reps;Supine Heel Slides: AROM;AAROM;10 reps;Right;Supine   Assessment/Plan    PT Assessment Patient needs continued PT services  PT Problem List Decreased strength;Decreased mobility;Decreased balance;Decreased knowledge of use of DME;Pain       PT Treatment Interventions Therapeutic exercise;Stair training;Gait training;Therapeutic activities;Patient/family education;Balance training;Functional mobility training;DME instruction    PT  Goals (Current goals can be found in the Care Plan section)  Acute Rehab PT Goals Patient Stated Goal: to go home PT Goal Formulation: With patient Time For Goal Achievement: 04/26/18 Potential to Achieve Goals: Good    Frequency 7X/week   Barriers to discharge        Co-evaluation               AM-PAC PT "6 Clicks" Daily Activity  Outcome Measure Difficulty turning over in bed (including adjusting bedclothes, sheets and blankets)?: A Little Difficulty moving from lying on back to sitting on the side of the bed? : Unable Difficulty sitting down on and standing up from a chair with arms (e.g., wheelchair, bedside commode, etc,.)?: Unable Help needed moving to and from a bed to chair (including a wheelchair)?: A Little Help needed walking in hospital room?: A Little Help needed climbing 3-5 steps with a railing? : Total 6 Click Score: 12    End of Session Equipment Utilized During Treatment: Gait belt Activity Tolerance: Patient limited by pain Patient left: with call bell/phone within reach;in chair;with chair alarm set   PT Visit Diagnosis: Other abnormalities of gait and mobility (R26.89);Pain Pain - Right/Left: Right Pain - part of body: Hip    Time: 2876-8115 PT Time Calculation (min) (ACUTE ONLY): 22 min   Charges:   PT Evaluation $PT Eval Moderate Complexity: 1 Mod     PT G CodesMagda Kiel, [place] [phone] 04/19/2018   Reginia Naas 04/19/2018, 6:14 PM

## 2018-04-19 NOTE — Anesthesia Preprocedure Evaluation (Addendum)
Anesthesia Evaluation  Patient identified by MRN, date of birth, ID band Patient awake    Reviewed: Allergy & Precautions, NPO status , Patient's Chart, lab work & pertinent test results, reviewed documented beta blocker date and time   Airway Mallampati: III  TM Distance: >3 FB Neck ROM: Full    Dental  (+) Teeth Intact, Caps   Pulmonary asthma , former smoker,  Snores- probable undiagnosed OSA   Pulmonary exam normal breath sounds clear to auscultation       Cardiovascular hypertension, Pt. on medications and Pt. on home beta blockers + CAD, + Past MI, + Cardiac Stents, + CABG and + Peripheral Vascular Disease  Normal cardiovascular exam Rhythm:Regular Rate:Normal  CABG 1991 MI 12/2014 Stent 12/2014 EKG Normal Sinus Rhythm, normal EKG Echo 04/07/2015-Left ventricle: The cavity size was normal. Systolic function was   normal. The estimated ejection fraction was in the range of 55%  to 60%. Wall motion was normal; there were no regional wall  motion abnormalities. Left ventricular diastolic function  parameters were normal. - Left atrium: The atrium was mildly dilated. - Atrial septum: No defect or patent foramen ovale was identified.  Myocardial perfusion Scan- 03/04/2018 Nuclear stress EF: 54%.  There is a small defect of mild severity present in the basal inferior location. The defect is non-reversible and consistent with diaphragmatic attenuation artifact. No ischemia noted.  The left ventricular ejection fraction is mildly decreased (45-54%).  Compared to study of 2014, fixed basal inferior defect is new.  There was no ST segment deviation noted during stress.  This is a low risk study.    Neuro/Psych PSYCHIATRIC DISORDERS Depression negative neurological ROS     GI/Hepatic negative GI ROS, Neg liver ROS,   Endo/Other  Hyperlipidemia  Renal/GU negative Renal ROS   ED    Musculoskeletal  (+) Arthritis ,  Osteoarthritis,  OA Right hip   Abdominal (+) + obese,   Peds  Hematology Plavix - last dose 04/11/2018   Anesthesia Other Findings   Reproductive/Obstetrics                       Anesthesia Physical Anesthesia Plan  ASA: III  Anesthesia Plan: Spinal   Post-op Pain Management:    Induction:   PONV Risk Score and Plan: 2 and Propofol infusion, Ondansetron and Treatment may vary due to age or medical condition  Airway Management Planned: Natural Airway, Nasal Cannula and Simple Face Mask  Additional Equipment:   Intra-op Plan:   Post-operative Plan:   Informed Consent: I have reviewed the patients History and Physical, chart, labs and discussed the procedure including the risks, benefits and alternatives for the proposed anesthesia with the patient or authorized representative who has indicated his/her understanding and acceptance.   Dental advisory given  Plan Discussed with: CRNA and Surgeon  Anesthesia Plan Comments:         Anesthesia Quick Evaluation

## 2018-04-19 NOTE — Anesthesia Postprocedure Evaluation (Signed)
Anesthesia Post Note  Patient: Dave Robles  Procedure(s) Performed: RIGHT TOTAL HIP ARTHROPLASTY ANTERIOR APPROACH (Right Hip)     Patient location during evaluation: PACU Anesthesia Type: Spinal Level of consciousness: oriented and awake and alert Pain management: pain level controlled Vital Signs Assessment: post-procedure vital signs reviewed and stable Respiratory status: spontaneous breathing, respiratory function stable, patient connected to nasal cannula oxygen and nonlabored ventilation Cardiovascular status: blood pressure returned to baseline and stable Postop Assessment: no headache, no backache, no apparent nausea or vomiting, patient able to bend at knees and spinal receding Anesthetic complications: no    Last Vitals:  Vitals:   04/19/18 1130 04/19/18 1145  BP: (!) 61/36 117/87  Pulse: 81 79  Resp: (!) 21 12  Temp:    SpO2: 98% 96%    Last Pain:  Vitals:   04/19/18 1130  PainSc: 0-No pain                 Mathis Cashman A.

## 2018-04-19 NOTE — Plan of Care (Signed)
  Problem: Education: Goal: Knowledge of General Education information will improve Outcome: Progressing   Problem: Activity: Goal: Risk for activity intolerance will decrease Outcome: Progressing   Problem: Nutrition: Goal: Adequate nutrition will be maintained Outcome: Progressing   Problem: Pain Managment: Goal: General experience of comfort will improve Outcome: Progressing   

## 2018-04-19 NOTE — Transfer of Care (Signed)
Immediate Anesthesia Transfer of Care Note  Patient: Dave Robles  Procedure(s) Performed: Procedure(s): RIGHT TOTAL HIP ARTHROPLASTY ANTERIOR APPROACH (Right)  Patient Location: PACU  Anesthesia Type:Spinal  Level of Consciousness:  sedated, patient cooperative and responds to stimulation  Airway & Oxygen Therapy:Patient Spontanous Breathing and Patient connected to face mask oxgen  Post-op Assessment:  Report given to PACU RN and Post -op Vital signs reviewed and stable  Post vital signs:  Reviewed and stable  Last Vitals:  Vitals:   04/19/18 0818  BP: (!) 147/78  Pulse: 62  Resp: 18  Temp: 36.8 C  SpO2: 18%    Complications: No apparent anesthesia complications

## 2018-04-19 NOTE — Op Note (Signed)
OPERATIVE REPORT- TOTAL HIP ARTHROPLASTY   PREOPERATIVE DIAGNOSIS: Osteoarthritis of the Right hip.   POSTOPERATIVE DIAGNOSIS: Osteoarthritis of the Right  hip.   PROCEDURE: Right total hip arthroplasty, anterior approach.   SURGEON: Gaynelle Arabian, MD   ASSISTANT: Molli Barrows, PA-C  ANESTHESIA:  Spinal  ESTIMATED BLOOD LOSS:-650 mL    DRAINS: Hemovac x1.   COMPLICATIONS: None   CONDITION: PACU - hemodynamically stable.   BRIEF CLINICAL NOTE: Dave Robles is a 72 y.o. male who has advanced end-  stage arthritis of their Right  hip with progressively worsening pain and  dysfunction.The patient has failed nonoperative management and presents for  total hip arthroplasty.   PROCEDURE IN DETAIL: After successful administration of spinal  anesthetic, the traction boots for the [facility] bed were placed on both  feet and the patient was placed onto the [facility] bed, boots placed into the leg  holders. The Right hip was then isolated from the perineum with plastic  drapes and prepped and draped in the usual sterile fashion. ASIS and  greater trochanter were marked and a oblique incision was made, starting  at about 1 cm lateral and 2 cm distal to the ASIS and coursing towards  the anterior cortex of the femur. The skin was cut with a 10 blade  through subcutaneous tissue to the level of the fascia overlying the  tensor fascia lata muscle. The fascia was then incised in line with the  incision at the junction of the anterior third and posterior 2/3rd. The  muscle was teased off the fascia and then the interval between the TFL  and the rectus was developed. The Hohmann retractor was then placed at  the top of the femoral neck over the capsule. The vessels overlying the  capsule were cauterized and the fat on top of the capsule was removed.  A Hohmann retractor was then placed anterior underneath the rectus  femoris to give exposure to the entire anterior capsule. A T-shaped   capsulotomy was performed. The edges were tagged and the femoral head  was identified.       Osteophytes are removed off the superior acetabulum.  The femoral neck was then cut in situ with an oscillating saw. Traction  was then applied to the left lower extremity utilizing the [facility]  traction. The femoral head was then removed. Retractors were placed  around the acetabulum and then circumferential removal of the labrum was  performed. Osteophytes were also removed. Reaming starts at 49 mm to  medialize and  Increased in 2 mm increments to 53 mm. We reamed in  approximately 40 degrees of abduction, 20 degrees anteversion. A 54 mm  pinnacle acetabular shell was then impacted in anatomic position under  fluoroscopic guidance with excellent purchase. We did not need to place  any additional dome screws. A 36 mm neutral + 4 marathon liner was then  placed into the acetabular shell.       The femoral lift was then placed along the lateral aspect of the femur  just distal to the vastus ridge. The leg was  externally rotated and capsule  was stripped off the inferior aspect of the femoral neck down to the  level of the lesser trochanter, this was done with electrocautery. The femur was lifted after this was performed. The  leg was then placed in an extended and adducted position essentially delivering the femur. We also removed the capsule superiorly and the piriformis from the piriformis  fossa to gain excellent exposure of the  proximal femur. Rongeur was used to remove some cancellous bone to get  into the lateral portion of the proximal femur for placement of the  initial starter reamer. The starter broaches was placed  the starter broach  and was shown to go down the center of the canal. Broaching  with the  Actis system was then performed starting at size 0, coursing  Up to size 5. A size 5 had excellent torsional and rotational  and axial stability. The trial high offset neck was then placed   with a 36 + 5 trial head. The hip was then reduced. We confirmed that  the stem was in the canal both on AP and lateral x-rays. It also has excellent sizing. The hip was reduced with outstanding stability through full extension and full external rotation.. AP pelvis was taken and the leg lengths were measured and found to be equal. Hip was then dislocated again and the femoral head and neck removed. The  femoral broach was removed. Size 5 Actis stem with a high offset  neck was then impacted into the femur following native anteversion. Has  excellent purchase in the canal. Excellent torsional and rotational and  axial stability. It is confirmed to be in the canal on AP and lateral  fluoroscopic views. The 36 + 5 ceramic head was placed and the hip  reduced with outstanding stability. Again AP pelvis was taken and it  confirmed that the leg lengths were equal. The wound was then copiously  irrigated with saline solution and the capsule reattached and repaired  with Ethibond suture. 30 ml of .25% Bupivicaine was  injected into the capsule and into the edge of the tensor fascia lata as well as subcutaneous tissue. The fascia overlying the tensor fascia lata was then closed with a running #1 V-Loc. Subcu was closed with interrupted 2-0 Vicryl and subcuticular running 4-0 Monocryl. Incision was cleaned  and dried. Steri-Strips and a bulky sterile dressing applied. Hemovac  drain was hooked to suction and then the patient was awakened and transported to  recovery in stable condition.        Please note that a surgical assistant was a medical necessity for this procedure to perform it in a safe and expeditious manner. Assistant was necessary to provide appropriate retraction of vital neurovascular structures and to prevent femoral fracture and allow for anatomic placement of the prosthesis.  Gaynelle Arabian, M.D.

## 2018-04-20 ENCOUNTER — Encounter (HOSPITAL_COMMUNITY): Payer: Self-pay | Admitting: Orthopedic Surgery

## 2018-04-20 LAB — CBC
HCT: 38.1 % — ABNORMAL LOW (ref 39.0–52.0)
Hemoglobin: 12.7 g/dL — ABNORMAL LOW (ref 13.0–17.0)
MCH: 31.8 pg (ref 26.0–34.0)
MCHC: 33.3 g/dL (ref 30.0–36.0)
MCV: 95.5 fL (ref 78.0–100.0)
PLATELETS: 136 10*3/uL — AB (ref 150–400)
RBC: 3.99 MIL/uL — AB (ref 4.22–5.81)
RDW: 13.4 % (ref 11.5–15.5)
WBC: 17.6 10*3/uL — AB (ref 4.0–10.5)

## 2018-04-20 LAB — BASIC METABOLIC PANEL
ANION GAP: 8 (ref 5–15)
BUN: 19 mg/dL (ref 6–20)
CALCIUM: 8.6 mg/dL — AB (ref 8.9–10.3)
CO2: 26 mmol/L (ref 22–32)
Chloride: 108 mmol/L (ref 101–111)
Creatinine, Ser: 1.12 mg/dL (ref 0.61–1.24)
GFR calc Af Amer: >60 mL/min (ref >60)
Glucose, Bld: 141 mg/dL — ABNORMAL HIGH (ref 65–99)
POTASSIUM: 4.2 mmol/L (ref 3.5–5.1)
SODIUM: 142 mmol/L (ref 135–145)

## 2018-04-20 MED ORDER — METHOCARBAMOL 500 MG PO TABS: 500.0000 mg | ORAL_TABLET | Freq: Four times a day (QID) | ORAL | 0 refills | Status: DC | PRN

## 2018-04-20 MED ORDER — OXYCODONE HCL 5 MG PO TABS: 5.0000 mg | ORAL_TABLET | Freq: Four times a day (QID) | ORAL | 0 refills | Status: DC | PRN

## 2018-04-20 MED ORDER — TRAMADOL HCL 50 MG PO TABS: 50.0000 mg | ORAL_TABLET | Freq: Four times a day (QID) | ORAL | 0 refills | Status: DC | PRN

## 2018-04-20 NOTE — Progress Notes (Signed)
   Subjective: 1 Day Post-Op Procedure(s) (LRB): RIGHT TOTAL HIP ARTHROPLASTY ANTERIOR APPROACH (Right) Patient reports pain as mild.   Patient seen in rounds with Dr. Wynelle Link. Patient is well, and has had no acute complaints or problems. Foley catheter removed. Denies chest pain or SOB. Voiding without difficulty. We will continue working with therapy today.   Objective: Vital signs in last 24 hours: Temp:  [97.4 F (36.3 C)-98.5 F (36.9 C)] 98.5 F (36.9 C) (06/06 0636) Pulse Rate:  [62-84] 63 (06/06 0636) Resp:  [12-21] 16 (06/06 0636) BP: (61-147)/(36-87) 122/59 (06/06 0636) SpO2:  [97 %-100 %] 100 % (06/06 0636) Weight:  [113.4 kg (250 lb)] 113.4 kg (250 lb) (06/05 0817)  Intake/Output from previous day:  Intake/Output Summary (Last 24 hours) at 04/20/2018 0746 Last data filed at 04/20/2018 0640 Gross per 24 hour  Intake 5575 ml  Output 3925 ml  Net 1650 ml    Labs: Recent Labs    04/20/18 0548  HGB 12.7*   Recent Labs    04/20/18 0548  WBC 17.6*  RBC 3.99*  HCT 38.1*  PLT 136*   Recent Labs    04/20/18 0548  NA 142  K 4.2  CL 108  CO2 26  BUN 19  CREATININE 1.12  GLUCOSE 141*  CALCIUM 8.6*   Exam: General - Patient is Alert and Oriented Extremity - Neurologically intact Neurovascular intact Sensation intact distally Dorsiflexion/Plantar flexion intact Dressing - dressing C/D/I Motor Function - intact, moving foot and toes well on exam.   Past Medical History:  Diagnosis Date  . AAA (abdominal aortic aneurysm) ([place])   . Adjustment disorder with depressed mood 03/04/2009  . Arthritis   . Colon polyps   . CORONARY ARTERY DISEASE 07/10/2007  . Depression   . ERECTILE DYSFUNCTION 07/10/2007  . Extrinsic asthma, unspecified 09/10/2008  . HYPERLIPIDEMIA 07/10/2007  . HYPERTENSION 07/10/2007  . Myocardial infarction ([place]) 2016   when had angioplasty- was told had a silent MI  . TOBACCO USE 12/15/2009    Assessment/Plan: 1 Day Post-Op  Procedure(s) (LRB): RIGHT TOTAL HIP ARTHROPLASTY ANTERIOR APPROACH (Right) Principal Problem:   OA (osteoarthritis) of hip  Estimated body mass index is 35.87 kg/m as calculated from the following:   Height as of this encounter: 5\' 10"  (1.778 m).   Weight as of this encounter: 113.4 kg (250 lb). Advance diet Up with therapy D/C IV fluids  DVT Prophylaxis - Aspirin and Plavix Weight bearing as tolerated. D/C O2 and pulse ox and try on room air. Hemovac pulled without difficulty, will continue therapy.  Plan is to go Home after hospital stay with HHPT. Possible discharge this afternoon if meeting goals and stable.  Theresa Duty, PA-C Orthopedic Surgery 04/20/2018, 7:46 AM

## 2018-04-20 NOTE — Progress Notes (Signed)
Physical Therapy Treatment Patient Details Name: Dave Robles MRN: [id number] DOB: 06-Mar-1946 Today's Date: 04/20/2018    History of Present Illness Patient is a 72 y/o male admitted for R direct anterior THA.      PT Comments    POD # 1 pm session Pt progressing slowly with issues of pain control and mobility.  Assisted OOB to amb to bathroom then performed stairs.  General stair comments: 50% VC's on proper tech and safety and sequencing with crutch placement.   Great difficulty and unsteady.  Only performed 2 steps and pt has 5.    Pt has not met goals to D/C to home today.  Reported to RN.   Follow Up Recommendations  Follow surgeon's recommendation for DC plan and follow-up therapies     Equipment Recommendations  None recommended by PT    Recommendations for Other Services       Precautions / Restrictions Precautions Precautions: Fall    Mobility  Bed Mobility Overal bed mobility: Needs Assistance Bed Mobility: Supine to Sit     Supine to sit: HOB elevated;Mod assist     General bed mobility comments: assist for R LE and to lift trunk cues for positioning to use railing plus increased time   assisted OOB and back to bed  Transfers Overall transfer level: Needs assistance Equipment used: Rolling walker (2 wheeled) Transfers: Sit to/from Stand Sit to Stand: Min assist         General transfer comment: 50% VC's on proper tech with hand trasition and safety with turns as well as increased time to complete due to pain  Ambulation/Gait Ambulation/Gait assistance: Min assist Ambulation Distance (Feet): 18 Feet Assistive device: Rolling walker (2 wheeled) Gait Pattern/deviations: Step-to pattern;Antalgic;Decreased stride length Gait velocity: decreased x 2   General Gait Details: great difficulty advancing R LE and increased/increased time to complete   Stairs Stairs: Yes Stairs assistance: Min assist Stair Management: One rail Right;Step to  pattern;Forwards Number of Stairs: 2 General stair comments: 50% VC's on proper tech and safety and sequencing with crutch placement.   Great difficulty and unsteady.  Only performed 2 steps and pt has 5.      Wheelchair Mobility    Modified Rankin (Stroke Patients Only)       Balance                                            Cognition Arousal/Alertness: Awake/alert Behavior During Therapy: WFL for tasks assessed/performed Overall Cognitive Status: Within Functional Limits for tasks assessed                                        Exercises      General Comments        Pertinent Vitals/Pain Pain Assessment: 0-10 Pain Score: 7  Pain Location: side of right hip Pain Descriptors / Indicators: Aching;Grimacing;Operative site guarding Pain Intervention(s): Monitored during session;Repositioned;Premedicated before session;Ice applied    Home Living                      Prior Function            PT Goals (current goals can now be found in the care plan section) Progress towards PT goals: Progressing toward goals  Frequency    7X/week      PT Plan Current plan remains appropriate    Co-evaluation              AM-PAC PT "6 Clicks" Daily Activity  Outcome Measure  Difficulty turning over in bed (including adjusting bedclothes, sheets and blankets)?: A Little Difficulty moving from lying on back to sitting on the side of the bed? : A Little Difficulty sitting down on and standing up from a chair with arms (e.g., wheelchair, bedside commode, etc,.)?: A Little Help needed moving to and from a bed to chair (including a wheelchair)?: A Little Help needed walking in hospital room?: A Little Help needed climbing 3-5 steps with a railing? : A Lot 6 Click Score: 17    End of Session Equipment Utilized During Treatment: Gait belt Activity Tolerance: Patient limited by pain Patient left: with call bell/phone within  reach;in chair;with chair alarm set Nurse Communication: Mobility status PT Visit Diagnosis: Other abnormalities of gait and mobility (R26.89);Pain Pain - Right/Left: Right Pain - part of body: Hip     Time: 1525-1550 PT Time Calculation (min) (ACUTE ONLY): 25 min  Charges:  $Gait Training: 8-22 mins $Therapeutic Activity: 8-22 mins                    G Codes:       Rica Koyanagi  PTA [facility]  Acute  Rehab Pager      [phone]

## 2018-04-20 NOTE — Progress Notes (Signed)
Discharge planning, spoke with patient at beside. No preference for [facility] agency for HHPT. eval and treat. Contacted [facility] for referral. Has RW but needs 3-n-1. Contacted [facility] to deliver to the room. [phone]

## 2018-04-20 NOTE — Progress Notes (Signed)
Physical Therapy Treatment Patient Details Name: Dave Robles MRN: [id number] DOB: Apr 19, 1946 Today's Date: 04/20/2018    History of Present Illness Patient is a 72 y/o male admitted for R direct anterior THA.      PT Comments    POD # 1 am session Progressing slowly.  General transfer comment: 50% VC's on proper tech with hand trasition and safety with turns as well as increased time to complete due to pain  Returned to room and performed some THR TE's following HEP handout.  Instructed on proper tech, freq as well as use of ICE.   Follow Up Recommendations  Follow surgeon's recommendation for DC plan and follow-up therapies     Equipment Recommendations  None recommended by PT    Recommendations for Other Services       Precautions / Restrictions Precautions Precautions: Fall    Mobility  Bed Mobility Overal bed mobility: Needs Assistance Bed Mobility: Supine to Sit     Supine to sit: HOB elevated;Mod assist     General bed mobility comments: assist for R LE and to lift trunk cues for positioning to use railing plus increased time  Transfers Overall transfer level: Needs assistance Equipment used: Rolling walker (2 wheeled) Transfers: Sit to/from Stand Sit to Stand: Min assist         General transfer comment: 50% VC's on proper tech with hand trasition and safety with turns as well as increased time to complete due to pain  Ambulation/Gait Ambulation/Gait assistance: Min assist Ambulation Distance (Feet): 25 Feet Assistive device: Rolling walker (2 wheeled) Gait Pattern/deviations: Step-to pattern;Antalgic;Decreased stride length Gait velocity: decreased x 2   General Gait Details: great difficulty advancing R LE and increased/increased time to complete 25 feet (nearly 12 min).     Stairs             Wheelchair Mobility    Modified Rankin (Stroke Patients Only)       Balance                                             Cognition Arousal/Alertness: Awake/alert Behavior During Therapy: WFL for tasks assessed/performed Overall Cognitive Status: Within Functional Limits for tasks assessed                                        Exercises      General Comments        Pertinent Vitals/Pain Pain Assessment: 0-10 Pain Score: 7  Pain Location: side of right hip Pain Descriptors / Indicators: Aching;Grimacing;Operative site guarding Pain Intervention(s): Monitored during session;Repositioned;Premedicated before session;Ice applied    Home Living                      Prior Function            PT Goals (current goals can now be found in the care plan section) Progress towards PT goals: Progressing toward goals    Frequency    7X/week      PT Plan Current plan remains appropriate    Co-evaluation              AM-PAC PT "6 Clicks" Daily Activity  Outcome Measure  Difficulty turning over in bed (including adjusting bedclothes, sheets and blankets)?: A  Little Difficulty moving from lying on back to sitting on the side of the bed? : A Little Difficulty sitting down on and standing up from a chair with arms (e.g., wheelchair, bedside commode, etc,.)?: A Little Help needed moving to and from a bed to chair (including a wheelchair)?: A Little Help needed walking in hospital room?: A Little Help needed climbing 3-5 steps with a railing? : A Lot 6 Click Score: 17    End of Session Equipment Utilized During Treatment: Gait belt Activity Tolerance: Patient limited by pain Patient left: with call bell/phone within reach;in chair;with chair alarm set Nurse Communication: Mobility status PT Visit Diagnosis: Other abnormalities of gait and mobility (R26.89);Pain Pain - Right/Left: Right Pain - part of body: Hip     Time: 0925-1005 PT Time Calculation (min) (ACUTE ONLY): 40 min  Charges:  $Gait Training: 8-22 mins $Therapeutic Exercise: 8-22  mins $Therapeutic Activity: 8-22 mins                    G Codes:       Rica Koyanagi  PTA [facility]  Acute  Rehab Pager      [phone]

## 2018-04-21 LAB — BASIC METABOLIC PANEL
Anion gap: 8 (ref 5–15)
BUN: 21 mg/dL — AB (ref 6–20)
CALCIUM: 8.8 mg/dL — AB (ref 8.9–10.3)
CO2: 28 mmol/L (ref 22–32)
CREATININE: 1.08 mg/dL (ref 0.61–1.24)
Chloride: 105 mmol/L (ref 101–111)
GFR calc non Af Amer: >60 mL/min (ref >60)
Glucose, Bld: 97 mg/dL (ref 65–99)
Potassium: 4.2 mmol/L (ref 3.5–5.1)
SODIUM: 141 mmol/L (ref 135–145)

## 2018-04-21 LAB — CBC
HCT: 40.1 % (ref 39.0–52.0)
HEMOGLOBIN: 13.7 g/dL (ref 13.0–17.0)
MCH: 32.6 pg (ref 26.0–34.0)
MCHC: 34.2 g/dL (ref 30.0–36.0)
MCV: 95.5 fL (ref 78.0–100.0)
PLATELETS: 145 10*3/uL — AB (ref 150–400)
RBC: 4.2 MIL/uL — ABNORMAL LOW (ref 4.22–5.81)
RDW: 13.8 % (ref 11.5–15.5)
WBC: 17.9 10*3/uL — ABNORMAL HIGH (ref 4.0–10.5)

## 2018-04-21 NOTE — Progress Notes (Signed)
   Subjective: 2 Days Post-Op Procedure(s) (LRB): RIGHT TOTAL HIP ARTHROPLASTY ANTERIOR APPROACH (Right) Patient reports pain as mild.   Patient seen in rounds with Dr. Wynelle Link. Patient is well, and has had no acute complaints or problems. No issues overnight. No SOB or chest pain. Voiding and reports flatus. Walked with PT yesterday.  We will resume therapy today.  Plan is to go Home after hospital stay.  Objective: Vital signs in last 24 hours: Temp:  [97.8 F (36.6 C)-98.9 F (37.2 C)] 97.8 F (36.6 C) (06/07 0432) Pulse Rate:  [63-82] 63 (06/07 0432) Resp:  [15-16] 16 (06/07 0432) BP: (111-173)/(61-93) 173/93 (06/07 0432) SpO2:  [97 %-100 %] 97 % (06/07 0432)  Intake/Output from previous day:  Intake/Output Summary (Last 24 hours) at 04/21/2018 0726 Last data filed at 04/21/2018 0558 Gross per 24 hour  Intake 1680 ml  Output 1925 ml  Net -245 ml     Labs: Recent Labs    04/20/18 0548 04/21/18 0533  HGB 12.7* 13.7   Recent Labs    04/20/18 0548 04/21/18 0533  WBC 17.6* 17.9*  RBC 3.99* 4.20*  HCT 38.1* 40.1  PLT 136* 145*   Recent Labs    04/20/18 0548 04/21/18 0533  NA 142 141  K 4.2 4.2  CL 108 105  CO2 26 28  BUN 19 21*  CREATININE 1.12 1.08  GLUCOSE 141* 97  CALCIUM 8.6* 8.8*    EXAM General - Patient is Alert and Oriented Extremity - Neurologically intact Intact pulses distally Dorsiflexion/Plantar flexion intact Compartment soft Dressing - no drainage. Dressing changed. Motor Function - intact, moving foot and toes well on exam.   Past Medical History:  Diagnosis Date  . AAA (abdominal aortic aneurysm) ([place])   . Adjustment disorder with depressed mood 03/04/2009  . Arthritis   . Colon polyps   . CORONARY ARTERY DISEASE 07/10/2007  . Depression   . ERECTILE DYSFUNCTION 07/10/2007  . Extrinsic asthma, unspecified 09/10/2008  . HYPERLIPIDEMIA 07/10/2007  . HYPERTENSION 07/10/2007  . Myocardial infarction ([place]) 2016   when had  angioplasty- was told had a silent MI  . TOBACCO USE 12/15/2009    Assessment/Plan: 2 Days Post-Op Procedure(s) (LRB): RIGHT TOTAL HIP ARTHROPLASTY ANTERIOR APPROACH (Right) Principal Problem:   OA (osteoarthritis) of hip  Estimated body mass index is 35.87 kg/m as calculated from the following:   Height as of this encounter: 5\' 10"  (1.778 m).   Weight as of this encounter: 113.4 kg (250 lb). Advance diet Up with therapy  DVT Prophylaxis - Aspirin and Plavix Weight Bearing As Tolerated   Continue to work with PT this morning. If he continues to progress and is meeting goals, plan for DC home today. Follow up in office in 2 weeks.    Ardeen Jourdain, PA-C Orthopaedic Surgery 04/21/2018, 7:26 AM

## 2018-04-21 NOTE — Progress Notes (Signed)
Reviewed discharge instructions with patient and spouse.  Both verbalized understanding.  All questions/concerns addressed; pt without further concerns at this time.  Pt discharging to home with all equipment and belongings.

## 2018-04-21 NOTE — Progress Notes (Signed)
Physical Therapy Treatment Patient Details Name: Dave Robles MRN: [id number] DOB: 07-17-1946 Today's Date: 04/21/2018    History of Present Illness Patient is a 72 y/o male admitted for R direct anterior THA.      PT Comments    POD # 2 pm session Spouse present for session.  General bed mobility comments: OOB in recliner but assisted back to bed at end of session  General transfer comment: 25% VC's on proper tech with hand trasition and safety with turns as well as increased time to complete due to pain  General stair comments: 50% VC's on proper tech and safety and sequencing with crutch placement.   Spouse present for "hands on" instruction  Pt HAS met goals to D/C to home and when told pt began to get irritated, nervous requesting to stay another day.  "I'm not ready".  "I can't move my leg"  "My pain is not managed".  Pt even asked about a [employer].  When I explained that was not in his plan, he questioned "why not, if that's what I want".  "I don't have enough help at home".  Pt more irritated.  Reported to RN.  PT HAS MET GOALS TO D/C TO HOME TODAY.    Follow Up Recommendations  Follow surgeon's recommendation for DC plan and follow-up therapies     Equipment Recommendations  None recommended by PT    Recommendations for Other Services       Precautions / Restrictions Precautions Precautions: Fall Restrictions Weight Bearing Restrictions: No    Mobility  Bed Mobility Overal bed mobility: Needs Assistance Bed Mobility: Sit to Supine     Supine to sit: HOB elevated;Mod assist     General bed mobility comments: OOB in recliner but assisted back to bed at end of session  Transfers Overall transfer level: Needs assistance Equipment used: Rolling walker (2 wheeled) Transfers: Sit to/from Stand Sit to Stand: Min guard         General transfer comment: 25% VC's on proper tech with hand trasition and safety with turns as well as increased time to complete  due to pain  Ambulation/Gait Ambulation/Gait assistance: Min guard Ambulation Distance (Feet): 22 Feet Assistive device: Rolling walker (2 wheeled) Gait Pattern/deviations: Step-to pattern;Antalgic;Decreased stride length Gait velocity: decreased x 2   General Gait Details: great difficulty advancing R LE and increased/increased time to complete      pain   Stairs   Stairs assistance: Min assist;Min guard Stair Management: One rail Right;Step to pattern;Forwards Number of Stairs: 5 General stair comments: 50% VC's on proper tech and safety and sequencing with crutch placement.   Spouse present for "hands on" instruction    Wheelchair Mobility    Modified Rankin (Stroke Patients Only)       Balance                                            Cognition Arousal/Alertness: Awake/alert Behavior During Therapy: WFL for tasks assessed/performed Overall Cognitive Status: Within Functional Limits for tasks assessed                                        Exercises      General Comments        Pertinent Vitals/Pain Pain Assessment:  0-10 Pain Score: 8  Pain Location: side of right hip Pain Descriptors / Indicators: Aching;Grimacing;Operative site guarding Pain Intervention(s): Monitored during session;Repositioned;Premedicated before session;Ice applied    Home Living                      Prior Function            PT Goals (current goals can now be found in the care plan section) Progress towards PT goals: Progressing toward goals    Frequency    7X/week      PT Plan Current plan remains appropriate    Co-evaluation              AM-PAC PT "6 Clicks" Daily Activity  Outcome Measure  Difficulty turning over in bed (including adjusting bedclothes, sheets and blankets)?: A Little Difficulty moving from lying on back to sitting on the side of the bed? : A Little Difficulty sitting down on and standing up from  a chair with arms (e.g., wheelchair, bedside commode, etc,.)?: A Little Help needed moving to and from a bed to chair (including a wheelchair)?: A Little Help needed walking in hospital room?: A Little Help needed climbing 3-5 steps with a railing? : A Lot 6 Click Score: 17    End of Session Equipment Utilized During Treatment: Gait belt Activity Tolerance: Patient limited by pain Patient left: with call bell/phone within reach;in chair;with chair alarm set Nurse Communication: Mobility status PT Visit Diagnosis: Other abnormalities of gait and mobility (R26.89);Pain Pain - Right/Left: Right Pain - part of body: Hip     Time: 1345-1430 PT Time Calculation (min) (ACUTE ONLY): 45 min  Charges:  $Gait Training: 23-37 mins $Therapeutic Activity: 8-22 mins                    G Codes:       Rica Koyanagi  PTA [facility]  Acute  Rehab Pager      [phone]

## 2018-04-21 NOTE — Progress Notes (Signed)
Physical Therapy Treatment Patient Details Name: Dave Robles MRN: [id number] DOB: Jan 01, 1946 Today's Date: 04/21/2018    History of Present Illness Patient is a 72 y/o male admitted for R direct anterior THA.      PT Comments    POD # 2 am session Pt reports a "bad morning" of increased pain and "not getting meds in time".  Assisted with amb in hallway. General Gait Details: great difficulty advancing R LE and increased/increased time to complete      Pain     Practiced 2 steps using one rail and one crutch.  Returned to room with MAX c/o pain and fatigue.  Pt plans to D/C to home later today.  Will repeat stair training with spouse.    Follow Up Recommendations  Follow surgeon's recommendation for DC plan and follow-up therapies     Equipment Recommendations  None recommended by PT    Recommendations for Other Services       Precautions / Restrictions Precautions Precautions: Fall Restrictions Weight Bearing Restrictions: No    Mobility  Bed Mobility Overal bed mobility: Needs Assistance Bed Mobility: Supine to Sit     Supine to sit: HOB elevated;Mod assist     General bed mobility comments: assist for R LE and to lift trunk cues for positioning to use railing plus increased time   assisted OOB   Transfers Overall transfer level: Needs assistance Equipment used: Rolling walker (2 wheeled) Transfers: Sit to/from Stand Sit to Stand: Min guard         General transfer comment: 50% VC's on proper tech with hand trasition and safety with turns as well as increased time to complete due to pain  Ambulation/Gait Ambulation/Gait assistance: Min guard Ambulation Distance (Feet): 12 Feet Assistive device: Rolling walker (2 wheeled) Gait Pattern/deviations: Step-to pattern;Antalgic;Decreased stride length Gait velocity: decreased x 2   General Gait Details: great difficulty advancing R LE and increased/increased time to complete      pain   Stairs     Stair  Management: One rail Right;Step to pattern;Forwards Number of Stairs: 2 General stair comments: 50% VC's on proper tech and safety and sequencing with crutch placement.   Great difficulty and unsteady.  Only performed 2 steps and pt has 5.      Wheelchair Mobility    Modified Rankin (Stroke Patients Only)       Balance                                            Cognition Arousal/Alertness: Awake/alert Behavior During Therapy: WFL for tasks assessed/performed Overall Cognitive Status: Within Functional Limits for tasks assessed                                        Exercises      General Comments        Pertinent Vitals/Pain Pain Assessment: 0-10 Pain Score: 8  Pain Location: side of right hip Pain Descriptors / Indicators: Aching;Grimacing;Operative site guarding Pain Intervention(s): Monitored during session;Repositioned;Premedicated before session;Ice applied    Home Living                      Prior Function            PT Goals (current goals  can now be found in the care plan section) Progress towards PT goals: Progressing toward goals    Frequency    7X/week      PT Plan Current plan remains appropriate    Co-evaluation              AM-PAC PT "6 Clicks" Daily Activity  Outcome Measure  Difficulty turning over in bed (including adjusting bedclothes, sheets and blankets)?: A Little Difficulty moving from lying on back to sitting on the side of the bed? : A Little Difficulty sitting down on and standing up from a chair with arms (e.g., wheelchair, bedside commode, etc,.)?: A Little Help needed moving to and from a bed to chair (including a wheelchair)?: A Little Help needed walking in hospital room?: A Little Help needed climbing 3-5 steps with a railing? : A Lot 6 Click Score: 17    End of Session Equipment Utilized During Treatment: Gait belt Activity Tolerance: Patient limited by pain Patient  left: with call bell/phone within reach;in chair;with chair alarm set Nurse Communication: Mobility status PT Visit Diagnosis: Other abnormalities of gait and mobility (R26.89);Pain Pain - Right/Left: Right Pain - part of body: Hip     Time: 3832-9191 PT Time Calculation (min) (ACUTE ONLY): 32 min  Charges:  $Gait Training: 8-22 mins $Therapeutic Activity: 8-22 mins                    G Codes:       Rica Koyanagi  PTA [facility]  Acute  Rehab Pager      [phone]

## 2018-04-22 DIAGNOSIS — E785 Hyperlipidemia, unspecified: Secondary | ICD-10-CM | POA: Diagnosis not present

## 2018-04-22 DIAGNOSIS — M25562 Pain in left knee: Secondary | ICD-10-CM | POA: Diagnosis not present

## 2018-04-22 DIAGNOSIS — I252 Old myocardial infarction: Secondary | ICD-10-CM | POA: Diagnosis not present

## 2018-04-22 DIAGNOSIS — I10 Essential (primary) hypertension: Secondary | ICD-10-CM | POA: Diagnosis not present

## 2018-04-22 DIAGNOSIS — Z96641 Presence of right artificial hip joint: Secondary | ICD-10-CM | POA: Diagnosis not present

## 2018-04-22 DIAGNOSIS — R69 Illness, unspecified: Secondary | ICD-10-CM | POA: Diagnosis not present

## 2018-04-22 DIAGNOSIS — Z7902 Long term (current) use of antithrombotics/antiplatelets: Secondary | ICD-10-CM | POA: Diagnosis not present

## 2018-04-22 DIAGNOSIS — Z471 Aftercare following joint replacement surgery: Secondary | ICD-10-CM | POA: Diagnosis not present

## 2018-04-22 DIAGNOSIS — I714 Abdominal aortic aneurysm, without rupture: Secondary | ICD-10-CM | POA: Diagnosis not present

## 2018-04-22 DIAGNOSIS — I25119 Atherosclerotic heart disease of native coronary artery with unspecified angina pectoris: Secondary | ICD-10-CM | POA: Diagnosis not present

## 2018-04-23 NOTE — Discharge Summary (Signed)
Physician Discharge Summary   Patient ID: Dave Robles MRN: [id number] DOB/AGE: Nov 11, 1946 72 y.o.  Admit date: 04/19/2018 Discharge date: 04/21/2018  Primary Diagnosis: Primary osteoarthritis right hip  Admission Diagnoses:  Past Medical History:  Diagnosis Date  . AAA (abdominal aortic aneurysm) ([place])   . Adjustment disorder with depressed mood 03/04/2009  . Arthritis   . Colon polyps   . CORONARY ARTERY DISEASE 07/10/2007  . Depression   . ERECTILE DYSFUNCTION 07/10/2007  . Extrinsic asthma, unspecified 09/10/2008  . HYPERLIPIDEMIA 07/10/2007  . HYPERTENSION 07/10/2007  . Myocardial infarction ([place]) 2016   when had angioplasty- was told had a silent MI  . TOBACCO USE 12/15/2009   Discharge Diagnoses:   Principal Problem:   OA (osteoarthritis) of hip  Estimated body mass index is 35.87 kg/m as calculated from the following:   Height as of this encounter: '5\' 10"'  (1.778 m).   Weight as of this encounter: 113.4 kg (250 lb).  Procedure(s) (LRB): RIGHT TOTAL HIP ARTHROPLASTY ANTERIOR APPROACH (Right)   Consults: None  HPI: Dave Robles, 72 y.o. male, has a history of pain and functional disability in the right hip(s) due to arthritis and patient has failed non-surgical conservative treatments for greater than 12 weeks to include NSAID's and/or analgesics, flexibility and strengthening excercises and activity modification.  Onset of symptoms was gradual starting 5 years ago with gradually worsening course since that time.The patient noted no past surgery on the right hip(s).  Patient currently rates pain in the right hip at 7 out of 10 with activity. Patient has night pain, worsening of pain with activity and weight bearing, pain that interfers with activities of daily living and pain with passive range of motion. Patient has evidence of subchondral cysts, periarticular osteophytes and joint space narrowing by imaging studies. This condition presents safety issues increasing the  risk of falls. There is no current active infection.    Laboratory Data: Admission on 04/19/2018, Discharged on 04/21/2018  Component Date Value Ref Range Status  . WBC 04/20/2018 17.6* 4.0 - 10.5 K/uL Final  . RBC 04/20/2018 3.99* 4.22 - 5.81 MIL/uL Final  . Hemoglobin 04/20/2018 12.7* 13.0 - 17.0 g/dL Final  . HCT 04/20/2018 38.1* 39.0 - 52.0 % Final  . MCV 04/20/2018 95.5  78.0 - 100.0 fL Final  . MCH 04/20/2018 31.8  26.0 - 34.0 pg Final  . MCHC 04/20/2018 33.3  30.0 - 36.0 g/dL Final  . RDW 04/20/2018 13.4  11.5 - 15.5 % Final  . Platelets 04/20/2018 136* 150 - 400 K/uL Final   Performed at [facility], [place] [address], [place], [place] [address]  . Sodium 04/20/2018 142  135 - 145 mmol/L Final  . Potassium 04/20/2018 4.2  3.5 - 5.1 mmol/L Final  . Chloride 04/20/2018 108  101 - 111 mmol/L Final  . CO2 04/20/2018 26  22 - 32 mmol/L Final  . Glucose, Bld 04/20/2018 141* 65 - 99 mg/dL Final  . BUN 04/20/2018 19  6 - 20 mg/dL Final  . Creatinine, Ser 04/20/2018 1.12  0.61 - 1.24 mg/dL Final  . Calcium 04/20/2018 8.6* 8.9 - 10.3 mg/dL Final  . GFR calc non Af Amer 04/20/2018 >60  >60 mL/min Final  . GFR calc Af Amer 04/20/2018 >60  >60 mL/min Final   Comment: (NOTE) The eGFR has been calculated using the CKD EPI equation. This calculation has not been validated in all clinical situations. eGFR's persistently <60 mL/min signify possible Chronic Kidney Disease.   Marland Kitchen  Anion gap 04/20/2018 8  5 - 15 Final   Performed at [facility], [place] [address] [place], [place] [address]  . WBC 04/21/2018 17.9* 4.0 - 10.5 K/uL Final  . RBC 04/21/2018 4.20* 4.22 - 5.81 MIL/uL Final  . Hemoglobin 04/21/2018 13.7  13.0 - 17.0 g/dL Final  . HCT 04/21/2018 40.1  39.0 - 52.0 % Final  . MCV 04/21/2018 95.5  78.0 - 100.0 fL Final  . MCH 04/21/2018 32.6  26.0 - 34.0 pg Final  . MCHC 04/21/2018 34.2  30.0 - 36.0 g/dL Final  . RDW 04/21/2018 13.8  11.5 - 15.5 %  Final  . Platelets 04/21/2018 145* 150 - 400 K/uL Final   Performed at [facility], [place] [address], [place], [place] [address]  . Sodium 04/21/2018 141  135 - 145 mmol/L Final  . Potassium 04/21/2018 4.2  3.5 - 5.1 mmol/L Final  . Chloride 04/21/2018 105  101 - 111 mmol/L Final  . CO2 04/21/2018 28  22 - 32 mmol/L Final  . Glucose, Bld 04/21/2018 97  65 - 99 mg/dL Final  . BUN 04/21/2018 21* 6 - 20 mg/dL Final  . Creatinine, Ser 04/21/2018 1.08  0.61 - 1.24 mg/dL Final  . Calcium 04/21/2018 8.8* 8.9 - 10.3 mg/dL Final  . GFR calc non Af Amer 04/21/2018 >60  >60 mL/min Final  . GFR calc Af Amer 04/21/2018 >60  >60 mL/min Final   Comment: (NOTE) The eGFR has been calculated using the CKD EPI equation. This calculation has not been validated in all clinical situations. eGFR's persistently <60 mL/min signify possible Chronic Kidney Disease.   Georgiann Hahn gap 04/21/2018 8  5 - 15 Final   Performed at [facility], [place] [address] [place], [place] [address]  Hospital Outpatient Visit on 04/11/2018  Component Date Value Ref Range Status  . MRSA, PCR 04/11/2018 NEGATIVE  NEGATIVE Final  . Staphylococcus aureus 04/11/2018 NEGATIVE  NEGATIVE Final   Comment: (NOTE) The Xpert SA Assay (FDA approved for NASAL specimens in patients 25 years of age and older), is one component of a comprehensive surveillance program. It is not intended to diagnose infection nor to guide or monitor treatment. Performed at [facility], [place] [address], [place], [place] [address]   . aPTT 04/11/2018 29  24 - 36 seconds Final   Performed at [facility], [place] [address], [place],  [address]  . WBC 04/11/2018 7.2  4.0 - 10.5 K/uL Final  . RBC 04/11/2018 4.76  4.22 - 5.81 MIL/uL Final  . Hemoglobin 04/11/2018 15.1  13.0 - 17.0 g/dL Final  . HCT 04/11/2018 44.2  39.0 - 52.0 % Final  . MCV 04/11/2018 92.9  78.0 - 100.0 fL Final  . MCH  04/11/2018 31.7  26.0 - 34.0 pg Final  . MCHC 04/11/2018 34.2  30.0 - 36.0 g/dL Final  . RDW 04/11/2018 13.4  11.5 - 15.5 % Final  . Platelets 04/11/2018 171  150 - 400 K/uL Final   Performed at [facility], [place] [address], [place],  [address]  . Sodium 04/11/2018 140  135 - 145 mmol/L Final  . Potassium 04/11/2018 3.8  3.5 - 5.1 mmol/L Final  . Chloride 04/11/2018 105  101 - 111 mmol/L Final  . CO2 04/11/2018 25  22 - 32 mmol/L Final  . Glucose, Bld 04/11/2018 103* 65 - 99 mg/dL Final  . BUN 04/11/2018 21* 6 - 20  mg/dL Final  . Creatinine, Ser 04/11/2018 1.19  0.61 - 1.24 mg/dL Final  . Calcium 04/11/2018 9.0  8.9 - 10.3 mg/dL Final  . Total Protein 04/11/2018 6.5  6.5 - 8.1 g/dL Final  . Albumin 04/11/2018 3.7  3.5 - 5.0 g/dL Final  . AST 04/11/2018 22  15 - 41 U/L Final  . ALT 04/11/2018 22  17 - 63 U/L Final  . Alkaline Phosphatase 04/11/2018 77  38 - 126 U/L Final  . Total Bilirubin 04/11/2018 1.1  0.3 - 1.2 mg/dL Final  . GFR calc non Af Amer 04/11/2018 >60  >60 mL/min Final  . GFR calc Af Amer 04/11/2018 >60  >60 mL/min Final   Comment: (NOTE) The eGFR has been calculated using the CKD EPI equation. This calculation has not been validated in all clinical situations. eGFR's persistently <60 mL/min signify possible Chronic Kidney Disease.   Georgiann Hahn gap 04/11/2018 10  5 - 15 Final   Performed at [facility], [place] [address] [place], [place] [address]  . Prothrombin Time 04/11/2018 13.7  11.4 - 15.2 seconds Final  . INR 04/11/2018 1.06   Final   Performed at [facility], [place] [address], [place], [place] [address]  . ABO/RH(D) 04/11/2018 O POS   Final  . Antibody Screen 04/11/2018 NEG   Final  . Sample Expiration 04/11/2018 04/22/2018   Final  . Extend sample reason 04/11/2018    Final                   Value:NO TRANSFUSIONS OR PREGNANCY IN THE PAST 3 MONTHS Performed at [facility], [place]  [address], [place], [place] [address]   . Color, Urine 04/11/2018 YELLOW  YELLOW Final  . APPearance 04/11/2018 CLEAR  CLEAR Final  . Specific Gravity, Urine 04/11/2018 1.013  1.005 - 1.030 Final  . pH 04/11/2018 5.0  5.0 - 8.0 Final  . Glucose, UA 04/11/2018 NEGATIVE  NEGATIVE mg/dL Final  . Hgb urine dipstick 04/11/2018 SMALL* NEGATIVE Final  . Bilirubin Urine 04/11/2018 NEGATIVE  NEGATIVE Final  . Ketones, ur 04/11/2018 NEGATIVE  NEGATIVE mg/dL Final  . Protein, ur 04/11/2018 NEGATIVE  NEGATIVE mg/dL Final  . Nitrite 04/11/2018 NEGATIVE  NEGATIVE Final  . Leukocytes, UA 04/11/2018 NEGATIVE  NEGATIVE Final  . RBC / HPF 04/11/2018 0-5  0 - 5 RBC/hpf Final  . WBC, UA 04/11/2018 0-5  0 - 5 WBC/hpf Final  . Bacteria, UA 04/11/2018 RARE* NONE SEEN Final  . Mucus 04/11/2018 PRESENT   Final   Performed at [facility], [place] [address] [place], [place] [address]  . ABO/RH(D) 04/11/2018    Final                   Value:O POS Performed at [facility], [place] [address], [place], [place] [address]      X-Rays:Dg Pelvis Portable  Result Date: 04/19/2018 CLINICAL DATA:  Postop right hip replacement EXAM: PORTABLE PELVIS 1-2 VIEWS COMPARISON:  Intraoperative imaging 04/19/2018 FINDINGS: Right hip replacement changes noted. Normal AP alignment. No hardware bony complicating feature. Soft tissue drain in place. IMPRESSION: Right hip replacement.  No visible complicating feature. Electronically Signed   By: Rolm Baptise M.D.   On: 04/19/2018 13:21   Dg C-arm 1-60 Min-no Report  Result Date: 04/19/2018 Fluoroscopy was utilized by the requesting physician.  No radiographic interpretation.    EKG: Orders placed or performed in visit on 01/24/18  .  EKG 12-Lead     Hospital Course: Patient was admitted to [facility] and taken to the OR and underwent the above state procedure without complications.  Patient tolerated the procedure well and was later  transferred to the recovery room and then to the orthopaedic floor for postoperative care.  They were given PO and IV analgesics for pain control following their surgery.  They were given 24 hours of postoperative antibiotics of  Anti-infectives (From admission, onward)   Start     Dose/Rate Route Frequency Ordered Stop   04/19/18 1600  ceFAZolin (ANCEF) IVPB 2g/100 mL premix     2 g 200 mL/hr over 30 Minutes Intravenous Every 6 hours 04/19/18 1404 04/19/18 2211   04/19/18 0802  ceFAZolin (ANCEF) 2-4 GM/100ML-% IVPB    Note to Pharmacy:  Randa Evens  : cabinet override      04/19/18 0802 04/19/18 0931   04/19/18 0800  ceFAZolin (ANCEF) IVPB 2g/100 mL premix     2 g 200 mL/hr over 30 Minutes Intravenous On call to O.R. 04/19/18 7026 04/19/18 0931     and started on DVT prophylaxis in the form of Aspirin and Plavix.   PT and OT were ordered for total hip protocol.  The patient was allowed to be WBAT with therapy. Discharge planning was consulted to help with postop disposition and equipment needs.  Patient had a good night on the evening of surgery.  They started to get up OOB with therapy on day one.  Hemovac drain was pulled without difficulty.  Continued to work with therapy into day two.  Dressing was changed on day two and the incision was clean and dry.  The patient had progressed with therapy and meeting their goals.  Incision was healing well.  Patient was seen in rounds and was ready to go home.  Diet: Cardiac diet Activity:WBAT Follow-up:in 2 weeks Disposition - Home Discharged Condition: stable   Discharge Instructions    Call MD / Call 911   Complete by:  As directed    If you experience chest pain or shortness of breath, CALL 911 and be transported to the hospital emergency room.  If you develope a fever above 101 F, pus (white drainage) or increased drainage or redness at the wound, or calf pain, call your surgeon's office.   Change dressing   Complete by:  As directed      You may change your dressing on Friday (June 7th), then change the dressing daily with sterile 4 x 4 inch gauze dressing and paper tape.  You may clean the incision with alcohol prior to redressing   Constipation Prevention   Complete by:  As directed    Drink plenty of fluids.  Prune juice may be helpful.  You may use a stool softener, such as Colace (over the counter) 100 mg twice a day.  Use MiraLax (over the counter) for constipation as needed.   Diet - low sodium heart healthy   Complete by:  As directed    Do not sit on low chairs, stoools or toilet seats, as it may be difficult to get up from low surfaces   Complete by:  As directed    Driving restrictions   Complete by:  As directed    No driving for 2 weeks   TED hose   Complete by:  As directed    Use stockings (TED hose) for 3 weeks on both leg(s).  You may remove them at night for  sleeping.   Weight bearing as tolerated   Complete by:  As directed      Allergies as of 04/21/2018   No Known Allergies     Medication List    TAKE these medications   acetaminophen 325 MG tablet Commonly known as:  TYLENOL Take 650 mg by mouth every 6 (six) hours as needed for moderate pain.   aspirin 81 MG tablet Take 81 mg by mouth daily.   atorvastatin 80 MG tablet Commonly known as:  LIPITOR TAKE 1 TABLET (80 MG TOTAL) BY MOUTH DAILY.   buPROPion 150 MG 12 hr tablet Commonly known as:  WELLBUTRIN SR Take 1 tablet (150 mg total) by mouth 2 (two) times daily. What changed:  when to take this   clopidogrel 75 MG tablet Commonly known as:  PLAVIX Take 1 tablet (75 mg total) by mouth daily.   cyclobenzaprine 10 MG tablet Commonly known as:  FLEXERIL TAKE 1 TABLET BY MOUTH NIGHTLY AT BEDTIME AS NEEDED FOR MUSCLE SPASM   diclofenac sodium 1 % Gel Commonly known as:  VOLTAREN Apply 1 application topically 4 (four) times daily.   lisinopril-hydrochlorothiazide 20-12.5 MG tablet Commonly known as:  PRINZIDE,ZESTORETIC Take 1  tablet by mouth daily.   methocarbamol 500 MG tablet Commonly known as:  ROBAXIN Take 1 tablet (500 mg total) by mouth every 6 (six) hours as needed for muscle spasms.   metoprolol tartrate 25 MG tablet Commonly known as:  LOPRESSOR Take 1 tablet (25 mg total) by mouth 2 (two) times daily.   multivitamin capsule Take 1 capsule by mouth daily.   naproxen sodium 220 MG tablet Commonly known as:  ALEVE Take 220 mg by mouth daily as needed.   nitroGLYCERIN 0.4 MG SL tablet Commonly known as:  NITROSTAT Place 1 tablet (0.4 mg total) under the tongue every 5 (five) minutes as needed.   oxyCODONE 5 MG immediate release tablet Commonly known as:  Oxy IR/ROXICODONE Take 1-2 tablets (5-10 mg total) by mouth every 6 (six) hours as needed for moderate pain or severe pain.   sildenafil 100 MG tablet Commonly known as:  VIAGRA Take 1 tablet (100 mg total) by mouth daily as needed. What changed:  reasons to take this   traMADol 50 MG tablet Commonly known as:  ULTRAM Take 1-2 tablets (50-100 mg total) by mouth every 6 (six) hours as needed for moderate pain (uncontrolled by Oxycodone).            Discharge Care Instructions  (From admission, onward)        Start     Ordered   04/20/18 0000  Weight bearing as tolerated     04/20/18 0753   04/20/18 0000  Change dressing    Comments:  You may change your dressing on Friday (June 7th), then change the dressing daily with sterile 4 x 4 inch gauze dressing and paper tape.  You may clean the incision with alcohol prior to redressing   04/20/18 0753     Follow-up Information    Gaynelle Arabian, MD. Schedule an appointment as soon as possible for a visit on 05/02/2018.   Specialty:  Orthopedic Surgery Contact information: [address] [place] [address] [place] [address] [phone]        [facility] - Dme Follow up.   Why:  [facility] Contact information: [address]  [place] [address] [phone]         [facility], [facility]-Home Follow up.  Specialty:  [place] Why:  physical therapy Contact information: [address] [place] [address] [phone]           Signed: Ardeen Jourdain, PA-C Orthopaedic Surgery 04/23/2018, 7:21 PM  a

## 2018-04-25 DIAGNOSIS — Z471 Aftercare following joint replacement surgery: Secondary | ICD-10-CM | POA: Diagnosis not present

## 2018-04-25 DIAGNOSIS — Z7902 Long term (current) use of antithrombotics/antiplatelets: Secondary | ICD-10-CM | POA: Diagnosis not present

## 2018-04-25 DIAGNOSIS — Z96641 Presence of right artificial hip joint: Secondary | ICD-10-CM | POA: Diagnosis not present

## 2018-04-25 DIAGNOSIS — I25119 Atherosclerotic heart disease of native coronary artery with unspecified angina pectoris: Secondary | ICD-10-CM | POA: Diagnosis not present

## 2018-04-25 DIAGNOSIS — E785 Hyperlipidemia, unspecified: Secondary | ICD-10-CM | POA: Diagnosis not present

## 2018-04-25 DIAGNOSIS — I10 Essential (primary) hypertension: Secondary | ICD-10-CM | POA: Diagnosis not present

## 2018-04-25 DIAGNOSIS — M25562 Pain in left knee: Secondary | ICD-10-CM | POA: Diagnosis not present

## 2018-04-25 DIAGNOSIS — I714 Abdominal aortic aneurysm, without rupture: Secondary | ICD-10-CM | POA: Diagnosis not present

## 2018-04-25 DIAGNOSIS — I252 Old myocardial infarction: Secondary | ICD-10-CM | POA: Diagnosis not present

## 2018-04-25 DIAGNOSIS — R69 Illness, unspecified: Secondary | ICD-10-CM | POA: Diagnosis not present

## 2018-04-28 DIAGNOSIS — I10 Essential (primary) hypertension: Secondary | ICD-10-CM | POA: Diagnosis not present

## 2018-04-28 DIAGNOSIS — Z96641 Presence of right artificial hip joint: Secondary | ICD-10-CM | POA: Diagnosis not present

## 2018-04-28 DIAGNOSIS — I252 Old myocardial infarction: Secondary | ICD-10-CM | POA: Diagnosis not present

## 2018-04-28 DIAGNOSIS — M25562 Pain in left knee: Secondary | ICD-10-CM | POA: Diagnosis not present

## 2018-04-28 DIAGNOSIS — Z7902 Long term (current) use of antithrombotics/antiplatelets: Secondary | ICD-10-CM | POA: Diagnosis not present

## 2018-04-28 DIAGNOSIS — I25119 Atherosclerotic heart disease of native coronary artery with unspecified angina pectoris: Secondary | ICD-10-CM | POA: Diagnosis not present

## 2018-04-28 DIAGNOSIS — R69 Illness, unspecified: Secondary | ICD-10-CM | POA: Diagnosis not present

## 2018-04-28 DIAGNOSIS — Z471 Aftercare following joint replacement surgery: Secondary | ICD-10-CM | POA: Diagnosis not present

## 2018-04-28 DIAGNOSIS — I714 Abdominal aortic aneurysm, without rupture: Secondary | ICD-10-CM | POA: Diagnosis not present

## 2018-04-28 DIAGNOSIS — E785 Hyperlipidemia, unspecified: Secondary | ICD-10-CM | POA: Diagnosis not present

## 2018-05-01 DIAGNOSIS — I10 Essential (primary) hypertension: Secondary | ICD-10-CM | POA: Diagnosis not present

## 2018-05-01 DIAGNOSIS — M25562 Pain in left knee: Secondary | ICD-10-CM | POA: Diagnosis not present

## 2018-05-01 DIAGNOSIS — R69 Illness, unspecified: Secondary | ICD-10-CM | POA: Diagnosis not present

## 2018-05-01 DIAGNOSIS — Z96641 Presence of right artificial hip joint: Secondary | ICD-10-CM | POA: Diagnosis not present

## 2018-05-01 DIAGNOSIS — I25119 Atherosclerotic heart disease of native coronary artery with unspecified angina pectoris: Secondary | ICD-10-CM | POA: Diagnosis not present

## 2018-05-01 DIAGNOSIS — Z471 Aftercare following joint replacement surgery: Secondary | ICD-10-CM | POA: Diagnosis not present

## 2018-05-01 DIAGNOSIS — E785 Hyperlipidemia, unspecified: Secondary | ICD-10-CM | POA: Diagnosis not present

## 2018-05-01 DIAGNOSIS — I252 Old myocardial infarction: Secondary | ICD-10-CM | POA: Diagnosis not present

## 2018-05-01 DIAGNOSIS — I714 Abdominal aortic aneurysm, without rupture: Secondary | ICD-10-CM | POA: Diagnosis not present

## 2018-05-01 DIAGNOSIS — Z7902 Long term (current) use of antithrombotics/antiplatelets: Secondary | ICD-10-CM | POA: Diagnosis not present

## 2018-05-03 DIAGNOSIS — Z96641 Presence of right artificial hip joint: Secondary | ICD-10-CM | POA: Diagnosis not present

## 2018-05-03 DIAGNOSIS — I252 Old myocardial infarction: Secondary | ICD-10-CM | POA: Diagnosis not present

## 2018-05-03 DIAGNOSIS — I25119 Atherosclerotic heart disease of native coronary artery with unspecified angina pectoris: Secondary | ICD-10-CM | POA: Diagnosis not present

## 2018-05-03 DIAGNOSIS — Z7902 Long term (current) use of antithrombotics/antiplatelets: Secondary | ICD-10-CM | POA: Diagnosis not present

## 2018-05-03 DIAGNOSIS — I714 Abdominal aortic aneurysm, without rupture: Secondary | ICD-10-CM | POA: Diagnosis not present

## 2018-05-03 DIAGNOSIS — Z471 Aftercare following joint replacement surgery: Secondary | ICD-10-CM | POA: Diagnosis not present

## 2018-05-03 DIAGNOSIS — I10 Essential (primary) hypertension: Secondary | ICD-10-CM | POA: Diagnosis not present

## 2018-05-03 DIAGNOSIS — E785 Hyperlipidemia, unspecified: Secondary | ICD-10-CM | POA: Diagnosis not present

## 2018-05-03 DIAGNOSIS — M25562 Pain in left knee: Secondary | ICD-10-CM | POA: Diagnosis not present

## 2018-05-03 DIAGNOSIS — R69 Illness, unspecified: Secondary | ICD-10-CM | POA: Diagnosis not present

## 2018-05-23 ENCOUNTER — Other Ambulatory Visit: Payer: Self-pay

## 2018-05-23 ENCOUNTER — Ambulatory Visit: Payer: Self-pay

## 2018-05-23 ENCOUNTER — Ambulatory Visit: Payer: Medicare HMO | Attending: Orthopedic Surgery | Admitting: Physical Therapy

## 2018-05-23 DIAGNOSIS — R2689 Other abnormalities of gait and mobility: Secondary | ICD-10-CM | POA: Diagnosis not present

## 2018-05-23 DIAGNOSIS — M6281 Muscle weakness (generalized): Secondary | ICD-10-CM | POA: Insufficient documentation

## 2018-05-23 DIAGNOSIS — M25651 Stiffness of right hip, not elsewhere classified: Secondary | ICD-10-CM

## 2018-05-23 NOTE — Therapy (Signed)
[place], [place], [address] Phone: [phone]   Fax:  [phone]  Physical Therapy Evaluation  Patient Details  Name: Dave Robles MRN: [id number] Date of Birth: 05-11-1946 Referring Provider: Gaynelle Arabian MD   Encounter Date: 05/23/2018  PT End of Session - 05/23/18 1353    Visit Number  1    Number of Visits  16    Date for PT Re-Evaluation  07/18/18    PT Start Time  6967    PT Stop Time  1437    PT Time Calculation (min)  43 min    Activity Tolerance  Patient tolerated treatment well    Behavior During Therapy  [facility] for tasks assessed/performed       Past Medical History:  Diagnosis Date  . AAA (abdominal aortic aneurysm) ([place])   . Adjustment disorder with depressed mood 03/04/2009  . Arthritis   . Colon polyps   . CORONARY ARTERY DISEASE 07/10/2007  . Depression   . ERECTILE DYSFUNCTION 07/10/2007  . Extrinsic asthma, unspecified 09/10/2008  . HYPERLIPIDEMIA 07/10/2007  . HYPERTENSION 07/10/2007  . Myocardial infarction [facility]) 2016   when had angioplasty- was told had a silent MI  . TOBACCO USE 12/15/2009    Past Surgical History:  Procedure Laterality Date  . COLONOSCOPY    . CORONARY ANGIOPLASTY  12/2014  . CORONARY ARTERY BYPASS GRAFT  11/15/89   5 vessels  . HYDROCELE EXCISION / REPAIR  2011  . INGUINAL HERNIA REPAIR Left 1985  . TOTAL HIP ARTHROPLASTY Right 04/19/2018   Procedure: RIGHT TOTAL HIP ARTHROPLASTY ANTERIOR APPROACH;  Surgeon: Gaynelle Arabian, MD;  Location: [facility] ORS;  Service: Orthopedics;  Laterality: Right;    There were no vitals filed for this visit.   Subjective Assessment - 05/23/18 1408    Subjective  Patient feels he hasn't progressed since his last HHPT as far a walking goes. He can go about 200 yards. If he has been sitting he has more difficulty walking. He's back to upright stationary bike at the gym 30 min. It feels good after strenghtening but  then starts to tighten up.    Pertinent History  MI, HTN, L knee meniscal repair 2014    Patient Stated Goals  to get back to PLOF    Currently in Pain?  No/denies         [facility] PT Assessment - 05/23/18 0001      Assessment   Medical Diagnosis  s/p R THA anterior approach    Referring Provider  Gaynelle Arabian MD    Onset Date/Surgical Date  04/19/18    Next MD Visit  05/30/18    Prior Therapy  HHPT 5 visits + eval      Precautions   Precautions  Anterior Hip      Balance Screen   Has the patient fallen in the past 6 months  Yes    How many times?  1 fell off bike    Has the patient had a decrease in activity level because of a fear of falling?   No    Is the patient reluctant to leave their home because of a fear of falling?   No      Home Environment   Living Environment  Private residence    Living Arrangements  Spouse/significant other    Type of [place] to enter    Entrance  Stairs-Number of Steps  5    Entrance Stairs-Rails  Can reach both    Home Layout  Two level    [place] - single point    Additional Comments  reciprocal gait up/ step to down      Prior Function   Level of Independence  Independent with household mobility with device    Vocation  Retired    Office manager, tennis, riding bike, Furniture conservator/restorer   Posture/Postural Control  Postural limitations    Posture Comments  depressed left shoulder, flexed trunk      ROM / Strength   AROM / PROM / Strength  AROM;Strength      AROM   AROM Assessment Site  Hip    Right/Left Hip  Right    Right Hip Flexion  90 94 deg passive    Right Hip External Rotation   -- WNL    Right Hip Internal Rotation   27      Strength   Overall Strength Comments  R hip flex 5/5 supine, ABD 4/5, ext 5/5      Flexibility   Soft Tissue Assessment /Muscle Length  yes    Hamstrings  WFL    ITB  mild tightness R    Piriformis  tight R    Quadratus Lumborum  Bil  tightness      Palpation   Palpation comment  lateral R leg, gluteals      Ambulation/Gait   Gait Pattern  Decreased arm swing - left;Decreased arm swing - right;Decreased stance time - right;Lateral trunk lean to left                Objective measurements completed on examination: See above findings.              PT Education - 05/23/18 1438    Education Details  HEP    Person(s) Educated  Patient    Methods  Explanation;Demonstration;Handout    Comprehension  Verbalized understanding;Returned demonstration       PT Short Term Goals - 05/23/18 1459      PT SHORT TERM GOAL #1   Title  I with Initial HEP for flexiblity     Time  2    Period  Weeks    Status  New    Target Date  06/06/18      PT SHORT TERM GOAL #2   Title  Patient able to ascend full flight of stairs with reciprocal gait pattern.    Time  4    Period  Weeks    Status  New    Target Date  06/20/18        PT Long Term Goals - 05/23/18 1500      PT LONG TERM GOAL #1   Title  Patient able to amb community distances without AD and minimal gait deviations.    Time  8    Period  Weeks    Status  New    Target Date  07/18/18      PT LONG TERM GOAL #2   Title  patient able to stand and walk without hip tightness.    Time  8    Period  Weeks    Status  New    Target Date  07/18/18      PT LONG TERM GOAL #3   Title  Patient able to descend stairs with a reciprocal gait pattern with  UE support    Time  8    Period  Weeks    Status  New    Target Date  07/18/18      PT LONG TERM GOAL #4   Title  Patient to demonstrate improved trunk rotation with gait and to allow return to sport    Time  8    Period  Weeks    Status  New    Target Date  07/18/18             Plan - 05/23/18 1439    Clinical Impression Statement  Patient presents for low complexity evaluation s/p R THA ant approach on 04/19/18. He amb with a SPC with gait deviations. He has flexibility deficits in his R  hip and low back and strength deficits affecting gait and stairs. He will benefit from PT to address these deficits.    History and Personal Factors relevant to plan of care:  MI    Clinical Presentation  Stable    Clinical Decision Making  Low    Rehab Potential  Excellent    PT Frequency  2x / week    PT Duration  8 weeks    PT Treatment/Interventions  ADLs/Self Care Home Management;Gait training;Stair training;Therapeutic exercise;Therapeutic activities;Neuromuscular re-education;Patient/family education;Passive range of motion;Manual techniques    PT Next Visit Plan  Review HEP and show kneeling HF stretch; LE strengthening; gait; stairs; also work trunk rotation and flexibilty to normalize gait    PT Home Exercise Plan  HF, SKTC, piriformis, LTR    Consulted and Agree with Plan of Care  Patient       Patient will benefit from skilled therapeutic intervention in order to improve the following deficits and impairments:  Abnormal gait, Decreased strength, Decreased range of motion, Impaired flexibility, Postural dysfunction  Visit Diagnosis: Other abnormalities of gait and mobility - Plan: PT plan of care cert/re-cert  Muscle weakness (generalized) - Plan: PT plan of care cert/re-cert  Stiffness of right hip, not elsewhere classified - Plan: PT plan of care cert/re-cert     Problem List Patient Active Problem List   Diagnosis Date Noted  . OA (osteoarthritis) of hip 04/19/2018  . AAA (abdominal aortic aneurysm) without rupture ([place]) 01/19/2017  . Pain in joint of left knee 01/19/2017  . Episodic mood disorder ([place]) 08/01/2014  . Personal history of colonic polyps 07/08/2014  . Onychomycosis 05/14/2014  . Hyperlipidemia 07/10/2007  . ERECTILE DYSFUNCTION 07/10/2007  . Essential hypertension 07/10/2007  . Coronary atherosclerosis 07/10/2007    Tyge Somers PT 05/23/2018, 3:10 PM  [place]  [place], [place], [address] Phone: [phone]   Fax:  [phone]  Name: Dave Robles MRN: [id number] Date of Birth: 1946-11-06

## 2018-05-23 NOTE — Patient Instructions (Signed)
Hip Flexor Stretch   Lying on back near edge of bed, bend one leg, foot flat. Hang other leg over edge, relaxed, thigh resting entirely on bed for _1___ minutes. Repeat __3 times. Do _2-3___ sessions per day.  Piriformis (Supine)  Cross legs, right on top. Gently pull other knee toward chest until stretch is felt in buttock/hip of top leg. Hold ____ seconds. Repeat ____ times per set. Do ____ sets per session. Do ____ sessions per day.    Knee-to-Chest Stretch: Unilateral    With hand behind right knee, pull knee in to chest until a comfortable stretch is felt in lower back and buttocks. Keep back relaxed. Hold _30___ seconds. Repeat _3___ times per set. Do ____ sets per session. Do __2__ sessions per day.     Lower Trunk Rotation Stretch   Keeping back flat and feet together, rotate knees to left side. Hold __10__ seconds.   Dave Robles, PT 05/23/18 2:36 PM;

## 2018-05-25 ENCOUNTER — Ambulatory Visit: Payer: Medicare HMO | Admitting: Physical Therapy

## 2018-05-25 ENCOUNTER — Encounter: Payer: Self-pay | Admitting: Physical Therapy

## 2018-05-25 DIAGNOSIS — M6281 Muscle weakness (generalized): Secondary | ICD-10-CM | POA: Diagnosis not present

## 2018-05-25 DIAGNOSIS — M25651 Stiffness of right hip, not elsewhere classified: Secondary | ICD-10-CM

## 2018-05-25 DIAGNOSIS — R2689 Other abnormalities of gait and mobility: Secondary | ICD-10-CM | POA: Diagnosis not present

## 2018-05-25 NOTE — Therapy (Signed)
[place] [place], [place], [address] Phone: [phone]   Fax:  [phone]  Physical Therapy Treatment  Patient Details  Name: Dave Robles MRN: [id number] Date of Birth: [birth year] Referring Provider: Gaynelle Arabian MD   Encounter Date: 05/25/2018  PT End of Session - 05/25/18 1058    Visit Number  2    Number of Visits  16    Date for PT Re-Evaluation  07/18/18    PT Start Time  1012    PT Stop Time  1113    PT Time Calculation (min)  61 min       Past Medical History:  Diagnosis Date  . AAA (abdominal aortic aneurysm) ([place])   . Adjustment disorder with depressed mood 03/04/2009  . Arthritis   . Colon polyps   . CORONARY ARTERY DISEASE 07/10/2007  . Depression   . ERECTILE DYSFUNCTION 07/10/2007  . Extrinsic asthma, unspecified 09/10/2008  . HYPERLIPIDEMIA 07/10/2007  . HYPERTENSION 07/10/2007  . Myocardial infarction [facility]) 2016   when had angioplasty- was told had a silent MI  . TOBACCO USE 12/15/2009    Past Surgical History:  Procedure Laterality Date  . COLONOSCOPY    . CORONARY ANGIOPLASTY  12/2014  . CORONARY ARTERY BYPASS GRAFT  11/15/89   5 vessels  . HYDROCELE EXCISION / REPAIR  2011  . INGUINAL HERNIA REPAIR Left 1985  . TOTAL HIP ARTHROPLASTY Right 04/19/2018   Procedure: RIGHT TOTAL HIP ARTHROPLASTY ANTERIOR APPROACH;  Surgeon: Gaynelle Arabian, MD;  [facility]: [facility];  Service: Orthopedics;  Laterality: Right;    There were no vitals filed for this visit.  Subjective Assessment - 05/25/18 1012    Subjective  mowed alittle and did ex and stretches and very tight and sore in RT quad    Currently in Pain?  Yes    Pain Score  5     Pain Location  Hip    Pain Orientation  Right                       [facility] Adult PT Treatment/Exercise - 05/25/18 0001      Exercises   Exercises  Knee/Hip      Knee/Hip Exercises: Aerobic   Elliptical  2 fwd/2 back      Knee/Hip Exercises: Standing   Lateral Step Up  Right;2 sets;10 reps;Hand Hold: 1;Step Height: 6" plus 10 hip hiking    Walking with Sports Cord  30# fwd/back and SW    Other Standing Knee Exercises  40# cable press down 20 times    Other Standing Knee Exercises  hip 4 way with cable pulleys 5# 15 each      Knee/Hip Exercises: Supine   Bridges  Strengthening;15 reps feet on ball    Bridges with [employer]  Strengthening;15 reps    Bridges with Clamshell  Strengthening;15 reps red tband      Modalities   Modalities  Electrical Stimulation;Moist Heat      Moist Heat Therapy   Number Minutes Moist Heat  15 Minutes    Moist Heat Location  -- RT quad      Electrical Stimulation   Electrical Stimulation Location  RT quad    Electrical Stimulation Action  IFC    Electrical Stimulation Parameters  supien    Electrical Stimulation Goals  Pain      Manual Therapy   Manual Therapy  Soft tissue mobilization;Passive ROM  Soft tissue mobilization  RT hip flex and quad    Passive ROM  RT quad and HS       Trigger Point Dry Needling - 05/25/18 1059    Consent Given?  Yes    Education Handout Provided  Yes    Muscles Treated Lower Body  Quadriceps    Quadriceps Response  Twitch response elicited;Palpable increased muscle length             PT Short Term Goals - 05/23/18 1459      PT SHORT TERM GOAL #1   Title  I with Initial HEP for flexiblity     Time  2    Period  Weeks    Status  New    Target Date  06/06/18      PT SHORT TERM GOAL #2   Title  Patient able to ascend full flight of stairs with reciprocal gait pattern.    Time  4    Period  Weeks    Status  New    Target Date  06/20/18        PT Long Term Goals - 05/23/18 1500      PT LONG TERM GOAL #1   Title  Patient able to amb community distances without AD and minimal gait deviations.    Time  8    Period  Weeks    Status  New    Target Date  07/18/18      PT LONG TERM GOAL #2   Title  patient able  to stand and walk without hip tightness.    Time  8    Period  Weeks    Status  New    Target Date  07/18/18      PT LONG TERM GOAL #3   Title  Patient able to descend stairs with a reciprocal gait pattern with UE support    Time  8    Period  Weeks    Status  New    Target Date  07/18/18      PT LONG TERM GOAL #4   Title  Patient to demonstrate improved trunk rotation with gait and to allow return to sport    Time  8    Period  Weeks    Status  New    Target Date  07/18/18            Plan - 05/25/18 1059    Clinical Impression Statement  very tight Rt hip flex and trigger pts in quad. antalgic gait esp with 1st several steps from sitting. weak abductors esp with muscle isolation    PT Treatment/Interventions  ADLs/Self Care Home Management;Gait training;Stair training;Therapeutic exercise;Therapeutic activities;Neuromuscular re-education;Patient/family education;Passive range of motion;Manual techniques    PT Next Visit Plan  assess and progress       Patient will benefit from skilled therapeutic intervention in order to improve the following deficits and impairments:  Abnormal gait, Decreased strength, Decreased range of motion, Impaired flexibility, Postural dysfunction  Visit Diagnosis: Muscle weakness (generalized)  Other abnormalities of gait and mobility  Stiffness of right hip, not elsewhere classified     Problem List Patient Active Problem List   Diagnosis Date Noted  . OA (osteoarthritis) of hip 04/19/2018  . AAA (abdominal aortic aneurysm) without rupture ([place]) 01/19/2017  . Pain in joint of left knee 01/19/2017  . Episodic mood disorder ([place]) 08/01/2014  . Personal history of colonic polyps 07/08/2014  . Onychomycosis 05/14/2014  . Hyperlipidemia  07/10/2007  . ERECTILE DYSFUNCTION 07/10/2007  . Essential hypertension 07/10/2007  . Coronary atherosclerosis 07/10/2007    Twila Rappa,ANGIE  PTA 05/25/2018, 11:02 AM  [address] [place], [place], [address] Phone: [phone]   Fax:  [phone]  Name: Dave Robles MRN: [id number] Date of Birth: [birth year]

## 2018-05-25 NOTE — Patient Instructions (Signed)

## 2018-05-26 ENCOUNTER — Encounter: Payer: Self-pay | Admitting: Physical Therapy

## 2018-05-30 ENCOUNTER — Ambulatory Visit: Payer: Medicare HMO | Admitting: Physical Therapy

## 2018-05-30 ENCOUNTER — Encounter: Payer: Self-pay | Admitting: Physical Therapy

## 2018-05-30 DIAGNOSIS — R2689 Other abnormalities of gait and mobility: Secondary | ICD-10-CM | POA: Diagnosis not present

## 2018-05-30 DIAGNOSIS — M6281 Muscle weakness (generalized): Secondary | ICD-10-CM | POA: Diagnosis not present

## 2018-05-30 DIAGNOSIS — M25651 Stiffness of right hip, not elsewhere classified: Secondary | ICD-10-CM | POA: Diagnosis not present

## 2018-05-30 DIAGNOSIS — Z96641 Presence of right artificial hip joint: Secondary | ICD-10-CM | POA: Diagnosis not present

## 2018-05-30 DIAGNOSIS — Z471 Aftercare following joint replacement surgery: Secondary | ICD-10-CM | POA: Diagnosis not present

## 2018-05-30 NOTE — Therapy (Signed)
[place] [address] [place], [place], [address] Phone: [phone]   Fax:  [phone]  Physical Therapy Treatment  Patient Details  Name: Dave Robles MRN: [id number] Date of Birth: Oct 05, 1946 Referring Provider: Gaynelle Arabian MD   Encounter Date: 05/30/2018  PT End of Session - 05/30/18 0930    Visit Number  3    Number of Visits  16    Date for PT Re-Evaluation  07/18/18    PT Start Time  0900    PT Stop Time  1000    PT Time Calculation (min)  60 min       Past Medical History:  Diagnosis Date  . AAA (abdominal aortic aneurysm) ([place])   . Adjustment disorder with depressed mood 03/04/2009  . Arthritis   . Colon polyps   . CORONARY ARTERY DISEASE 07/10/2007  . Depression   . ERECTILE DYSFUNCTION 07/10/2007  . Extrinsic asthma, unspecified 09/10/2008  . HYPERLIPIDEMIA 07/10/2007  . HYPERTENSION 07/10/2007  . Myocardial infarction [facility]) 2016   when had angioplasty- was told had a silent MI  . TOBACCO USE 12/15/2009    Past Surgical History:  Procedure Laterality Date  . COLONOSCOPY    . CORONARY ANGIOPLASTY  12/2014  . CORONARY ARTERY BYPASS GRAFT  11/15/89   5 vessels  . HYDROCELE EXCISION / REPAIR  2011  . INGUINAL HERNIA REPAIR Left 1985  . TOTAL HIP ARTHROPLASTY Right 04/19/2018   Procedure: RIGHT TOTAL HIP ARTHROPLASTY ANTERIOR APPROACH;  Surgeon: Gaynelle Arabian, MD;  [facility];  Service: Orthopedics;  Laterality: Right;    There were no vitals filed for this visit.  Subjective Assessment - 05/30/18 0904    Subjective  alot better after last session    Currently in Pain?  Yes    Pain Score  2     Pain Location  Hip    Pain Orientation  Right                       [facility] Adult PT Treatment/Exercise - 05/30/18 0001      Knee/Hip Exercises: Aerobic   Elliptical  2 fwd/2 back      Knee/Hip Exercises: Machines for Strengthening   Cybex Leg Press  30# 3 sets 10  feet 3  positins and 1 set  RT LE only 20#      Knee/Hip Exercises: Standing   Other Standing Knee Exercises  multi hip abd ex in standing      Knee/Hip Exercises: Seated   Ball Squeeze  15 3 sec hold    Clamshell with TheraBand  Green    Sit to [employer]  3 sets;5 reps;without UE support green tband LE in abd      Knee/Hip Exercises: Sidelying   Hip ABduction  Strengthening;Right;15 reps 3#. 3# hip CC and CCW    Clams  3# 15      Knee/Hip Exercises: Prone   Other Prone Exercises  quadraped hip ex 3#      Modalities   Modalities  Electrical Stimulation;Moist Heat      Moist Heat Therapy   Number Minutes Moist Heat  15 Minutes    Moist Heat Location  Hip quad      Electrical Stimulation   Electrical Stimulation Location  RT quad    Electrical Stimulation Action  IFC    Electrical Stimulation Parameters  supine    Electrical Stimulation Goals  Pain  Manual Therapy   Manual Therapy  Soft tissue mobilization;Passive ROM    Soft tissue mobilization  RT hip flex and quad    Passive ROM  RT quad and HS       Trigger Point Dry Needling - 05/30/18 0943    Consent Given?  Yes    Muscles Treated Lower Body  Quadriceps    Quadriceps Response  Twitch response elicited;Palpable increased muscle length also did the ITB especially laterally           PT Education - 05/30/18 0930    Education Details  pool ex    Person(s) Educated  Patient    Methods  Explanation    Comprehension  Verbalized understanding       PT Short Term Goals - 05/23/18 1459      PT SHORT TERM GOAL #1   Title  I with Initial HEP for flexiblity     Time  2    Period  Weeks    Status  New    Target Date  06/06/18      PT SHORT TERM GOAL #2   Title  Patient able to ascend full flight of stairs with reciprocal gait pattern.    Time  4    Period  Weeks    Status  New    Target Date  06/20/18        PT Long Term Goals - 05/23/18 1500      PT LONG TERM GOAL #1   Title  Patient able to amb community  distances without AD and minimal gait deviations.    Time  8    Period  Weeks    Status  New    Target Date  07/18/18      PT LONG TERM GOAL #2   Title  patient able to stand and walk without hip tightness.    Time  8    Period  Weeks    Status  New    Target Date  07/18/18      PT LONG TERM GOAL #3   Title  Patient able to descend stairs with a reciprocal gait pattern with UE support    Time  8    Period  Weeks    Status  New    Target Date  07/18/18      PT LONG TERM GOAL #4   Title  Patient to demonstrate improved trunk rotation with gait and to allow return to sport    Time  8    Period  Weeks    Status  New    Target Date  07/18/18            Plan - 05/30/18 0930    Clinical Impression Statement  increased ROM in RT hip but continues to fatigue with isolated hip ( esp abd) muscles    PT Treatment/Interventions  ADLs/Self Care Home Management;Gait training;Stair training;Therapeutic exercise;Therapeutic activities;Neuromuscular re-education;Patient/family education;Passive range of motion;Manual techniques    PT Next Visit Plan  hip abd strengthening       Patient will benefit from skilled therapeutic intervention in order to improve the following deficits and impairments:  Abnormal gait, Decreased strength, Decreased range of motion, Impaired flexibility, Postural dysfunction  Visit Diagnosis: Muscle weakness (generalized)  Other abnormalities of gait and mobility  Stiffness of right hip, not elsewhere classified     Problem List Patient Active Problem List   Diagnosis Date Noted  . OA (osteoarthritis) of hip 04/19/2018  .  AAA (abdominal aortic aneurysm) without rupture ([place]) 01/19/2017  . Pain in joint of left knee 01/19/2017  . Episodic mood disorder ([place]) 08/01/2014  . Personal history of colonic polyps 07/08/2014  . Onychomycosis 05/14/2014  . Hyperlipidemia 07/10/2007  . ERECTILE DYSFUNCTION 07/10/2007  . Essential hypertension 07/10/2007  .  Coronary atherosclerosis 07/10/2007    Sumner Boast 05/30/2018, 9:44 AM., PT  [facility] [address] [place], [place], [address] Phone: [phone]   Fax:  [phone]  Name: KAIRO LAUBACHER MRN: [id number] Date of Birth: 06-22-1946

## 2018-06-01 ENCOUNTER — Ambulatory Visit: Payer: Medicare HMO | Admitting: Physical Therapy

## 2018-06-01 DIAGNOSIS — M25651 Stiffness of right hip, not elsewhere classified: Secondary | ICD-10-CM

## 2018-06-01 DIAGNOSIS — M6281 Muscle weakness (generalized): Secondary | ICD-10-CM

## 2018-06-01 DIAGNOSIS — R2689 Other abnormalities of gait and mobility: Secondary | ICD-10-CM

## 2018-06-01 NOTE — Therapy (Signed)
[place], [place], [address] Phone: [phone]   Fax:  [phone]  Physical Therapy Treatment  Patient Details  Name: Dave Robles MRN: [id number] Date of Birth: [birth year] Referring Provider: Gaynelle Arabian MD   Encounter Date: 06/01/2018  PT End of Session - 06/01/18 1140    Visit Number  4    Number of Visits  16    Date for PT Re-Evaluation  07/18/18    PT Start Time  1100    PT Stop Time  1200    PT Time Calculation (min)  60 min       Past Medical History:  Diagnosis Date  . AAA (abdominal aortic aneurysm) ([place])   . Adjustment disorder with depressed mood 03/04/2009  . Arthritis   . Colon polyps   . CORONARY ARTERY DISEASE 07/10/2007  . Depression   . ERECTILE DYSFUNCTION 07/10/2007  . Extrinsic asthma, unspecified 09/10/2008  . HYPERLIPIDEMIA 07/10/2007  . HYPERTENSION 07/10/2007  . Myocardial infarction [facility]) 2016   when had angioplasty- was told had a silent MI  . TOBACCO USE 12/15/2009    Past Surgical History:  Procedure Laterality Date  . COLONOSCOPY    . CORONARY ANGIOPLASTY  12/2014  . CORONARY ARTERY BYPASS GRAFT  11/15/89   5 vessels  . HYDROCELE EXCISION / REPAIR  2011  . INGUINAL HERNIA REPAIR Left 1985  . TOTAL HIP ARTHROPLASTY Right 04/19/2018   Procedure: RIGHT TOTAL HIP ARTHROPLASTY ANTERIOR APPROACH;  Surgeon: Gaynelle Arabian, MD;  [facility];  Service: Orthopedics;  Laterality: Right;    There were no vitals filed for this visit.  Subjective Assessment - 06/01/18 1103    Subjective  i think change in stride has irritated bith ant knees occassionaly- hip is tight but feels better    Currently in Pain?  Yes    Pain Score  2                        [facility] Adult PT Treatment/Exercise - 06/01/18 0001      Knee/Hip Exercises: Aerobic   Nustep  L 5 5 min      Knee/Hip Exercises: Standing   Hip Flexion  Stengthening;Right;15 reps;Knee  bent with knee ext 3# 15    Hip Abduction  Stengthening;Right;20 reps;Knee straight 3#    Lateral Step Up  Right;2 sets;10 reps;Hand Hold: 1;Step Height: 6" hike hike 2 sets 10. 3# lat step tap    SLS with Vectors  on airex      Knee/Hip Exercises: Supine   Bridges with [employer]  Strengthening;15 reps    Bridges with Clamshell  15 reps      Moist Heat Therapy   Number Minutes Moist Heat  15 Minutes    Moist Heat Location  Hip      Electrical Stimulation   Electrical Stimulation Location  RT quad/IT    Electrical Stimulation Action  IFC    Electrical Stimulation Parameters  supine    Electrical Stimulation Goals  Pain      Manual Therapy   Manual Therapy  Soft tissue mobilization;Passive ROM    Soft tissue mobilization  RT hip flex and quad    Passive ROM  RT quad and HS       Trigger Point Dry Needling - 06/01/18 1250    Consent Given?  Yes    Muscles Treated Lower Body  Quadriceps  Quadriceps Response  Twitch response elicited;Palpable increased muscle length           PT Education - 06/01/18 1126    Education Details  tband hip 3 way BIL,lat step up,hip hike and bridge with ball    Person(s) Educated  Patient    Methods  Explanation;Handout;Demonstration    Comprehension  Verbalized understanding;Returned demonstration       PT Short Term Goals - 06/01/18 1128      PT SHORT TERM GOAL #1   Title  I with Initial HEP for flexiblity     Status  Achieved      PT SHORT TERM GOAL #2   Title  Patient able to ascend full flight of stairs with reciprocal gait pattern.    Status  Partially Met        PT Long Term Goals - 06/01/18 1128      PT LONG TERM GOAL #1   Title  Patient able to amb community distances without AD and minimal gait deviations.    Status  Partially Met      PT LONG TERM GOAL #2   Title  patient able to stand and walk without hip tightness.    Status  Partially Met      PT LONG TERM GOAL #3   Title  Patient able to descend stairs  with a reciprocal gait pattern with UE support    Status  Partially Met      PT LONG TERM GOAL #4   Title  Patient to demonstrate improved trunk rotation with gait and to allow return to sport    Status  Partially Met            Plan - 06/01/18 1141    Clinical Impression Statement  progressing with goals. some increased BIL knee pain which appears to be coming from changing stride as we strengthen and loosen hip muscles. progressing with goals. pt still weak in hip abd    PT Treatment/Interventions  ADLs/Self Care Home Management;Gait training;Stair training;Therapeutic exercise;Therapeutic activities;Neuromuscular re-education;Patient/family education;Passive range of motion;Manual techniques    PT Next Visit Plan  hip abd strengthening, PROM/DN. Issued HEP today check if any ? next sesion as pt is leaving for 3 weeks for vacation       Patient will benefit from skilled therapeutic intervention in order to improve the following deficits and impairments:  Abnormal gait, Decreased strength, Decreased range of motion, Impaired flexibility, Postural dysfunction  Visit Diagnosis: Muscle weakness (generalized)  Other abnormalities of gait and mobility  Stiffness of right hip, not elsewhere classified     Problem List Patient Active Problem List   Diagnosis Date Noted  . OA (osteoarthritis) of hip 04/19/2018  . AAA (abdominal aortic aneurysm) without rupture ([place]) 01/19/2017  . Pain in joint of left knee 01/19/2017  . Episodic mood disorder ([place]) 08/01/2014  . Personal history of colonic polyps 07/08/2014  . Onychomycosis 05/14/2014  . Hyperlipidemia 07/10/2007  . ERECTILE DYSFUNCTION 07/10/2007  . Essential hypertension 07/10/2007  . Coronary atherosclerosis 07/10/2007    Sumner Boast., PT 06/01/2018, 12:51 PM  [place] [address] [place], [place], [address] Phone: [phone]   Fax:   [phone]  Name: Dave Robles MRN: [id number] Date of Birth: 1946/06/19

## 2018-06-05 ENCOUNTER — Encounter: Payer: Self-pay | Admitting: Physical Therapy

## 2018-06-05 ENCOUNTER — Ambulatory Visit: Payer: Medicare HMO | Admitting: Physical Therapy

## 2018-06-05 DIAGNOSIS — M6281 Muscle weakness (generalized): Secondary | ICD-10-CM | POA: Diagnosis not present

## 2018-06-05 DIAGNOSIS — R2689 Other abnormalities of gait and mobility: Secondary | ICD-10-CM

## 2018-06-05 DIAGNOSIS — M25651 Stiffness of right hip, not elsewhere classified: Secondary | ICD-10-CM | POA: Diagnosis not present

## 2018-06-05 NOTE — Therapy (Signed)
[place] [address] [place], [place], [address] Phone: [phone]   Fax:  [phone]  Physical Therapy Treatment  Patient Details  Name: Dave Robles MRN: [id number] Date of Birth: [birth year] Referring Provider: Gaynelle Arabian MD   Encounter Date: 06/05/2018  PT End of Session - 06/05/18 1606    Visit Number  5    Date for PT Re-Evaluation  07/18/18    PT Start Time  1440    PT Stop Time  1545    PT Time Calculation (min)  65 min    Activity Tolerance  Patient tolerated treatment well    Behavior During Therapy  [facility] for tasks assessed/performed       Past Medical History:  Diagnosis Date  . AAA (abdominal aortic aneurysm) ([place])   . Adjustment disorder with depressed mood 03/04/2009  . Arthritis   . Colon polyps   . CORONARY ARTERY DISEASE 07/10/2007  . Depression   . ERECTILE DYSFUNCTION 07/10/2007  . Extrinsic asthma, unspecified 09/10/2008  . HYPERLIPIDEMIA 07/10/2007  . HYPERTENSION 07/10/2007  . Myocardial infarction [facility]) 2016   when had angioplasty- was told had a silent MI  . TOBACCO USE 12/15/2009    Past Surgical History:  Procedure Laterality Date  . COLONOSCOPY    . CORONARY ANGIOPLASTY  12/2014  . CORONARY ARTERY BYPASS GRAFT  11/15/89   5 vessels  . HYDROCELE EXCISION / REPAIR  2011  . INGUINAL HERNIA REPAIR Left 1985  . TOTAL HIP ARTHROPLASTY Right 04/19/2018   Procedure: RIGHT TOTAL HIP ARTHROPLASTY ANTERIOR APPROACH;  Surgeon: Gaynelle Arabian, MD;  [facility];  Service: Orthopedics;  Laterality: Right;    There were no vitals filed for this visit.  Subjective Assessment - 06/05/18 1452    Subjective  REports that he is concerned about his walking and how it may affect his knees due to compensation    Currently in Pain?  Yes    Pain Score  2     Pain Location  Hip    Pain Orientation  Right    Aggravating Factors   walking    Pain Relieving Factors  the treatment here                        [facility] Adult PT Treatment/Exercise - 06/05/18 0001      Knee/Hip Exercises: Stretches   Passive Hamstring Stretch  Right;3 reps;20 seconds    Quad Stretch  Right;3 reps;20 seconds      Knee/Hip Exercises: Aerobic   Recumbent Bike  level 1 x 5 minutes    Nustep  L 5 5 min      Knee/Hip Exercises: Standing   Hip Flexion  Stengthening;Right;15 reps;Knee bent;Limitations    Hip Flexion Limitations  3#    Hip Abduction  Stengthening;Right;20 reps;Knee straight;Limitations    Abduction Limitations  3#    Hip Extension  Right;Stengthening;20 reps;Limitations;Knee straight    Extension Limitations  3#    Walking with Sports Cord  black tband all directions      Moist Heat Therapy   Number Minutes Moist Heat  15 Minutes    Moist Heat Location  Hip      Electrical Stimulation   Electrical Stimulation Location  RT quad/IT    Electrical Stimulation Action  IFC    Electrical Stimulation Parameters  supine    Electrical Stimulation Goals  Pain      Manual Therapy  Manual Therapy  Soft tissue mobilization;Passive ROM    Soft tissue mobilization  scar, RT hip flex and quad    Passive ROM  RT quad and HS               PT Short Term Goals - 06/01/18 1128      PT SHORT TERM GOAL #1   Title  I with Initial HEP for flexiblity     Status  Achieved      PT SHORT TERM GOAL #2   Title  Patient able to ascend full flight of stairs with reciprocal gait pattern.    Status  Partially Met        PT Long Term Goals - 06/05/18 1613      PT LONG TERM GOAL #3   Title  Patient able to descend stairs with a reciprocal gait pattern with UE support    Status  Partially Met            Plan - 06/05/18 1607    Clinical Impression Statement  Patient seems to have tightness in the right hip flexor, to really stretch this he had to be in prone.  I think the anterior hip tightness, causes the decreased step length as he does not report pain.    PT Next  Visit Plan  he will be on vacation the next 3 weeks, he is to ease into the hip flexor stretch    Consulted and Agree with Plan of Care  Patient       Patient will benefit from skilled therapeutic intervention in order to improve the following deficits and impairments:  Abnormal gait, Decreased strength, Decreased range of motion, Impaired flexibility, Postural dysfunction  Visit Diagnosis: Muscle weakness (generalized)  Other abnormalities of gait and mobility  Stiffness of right hip, not elsewhere classified     Problem List Patient Active Problem List   Diagnosis Date Noted  . OA (osteoarthritis) of hip 04/19/2018  . AAA (abdominal aortic aneurysm) without rupture ([place]) 01/19/2017  . Pain in joint of left knee 01/19/2017  . Episodic mood disorder ([place]) 08/01/2014  . Personal history of colonic polyps 07/08/2014  . Onychomycosis 05/14/2014  . Hyperlipidemia 07/10/2007  . ERECTILE DYSFUNCTION 07/10/2007  . Essential hypertension 07/10/2007  . Coronary atherosclerosis 07/10/2007    Sumner Boast., PT 06/05/2018, 4:14 PM  [address] [place], [place], [address] Phone: [phone]   Fax:  [phone]  Name: ARINZE RIVADENEIRA MRN: [id number] Date of Birth: 07-11-1946

## 2018-06-06 ENCOUNTER — Ambulatory Visit: Payer: Medicare HMO | Admitting: Physical Therapy

## 2018-06-08 ENCOUNTER — Ambulatory Visit: Payer: Medicare HMO | Admitting: Physical Therapy

## 2018-07-04 ENCOUNTER — Encounter: Payer: Self-pay | Admitting: Physical Therapy

## 2018-07-04 ENCOUNTER — Ambulatory Visit: Payer: Medicare HMO | Attending: Orthopedic Surgery | Admitting: Physical Therapy

## 2018-07-04 DIAGNOSIS — M6281 Muscle weakness (generalized): Secondary | ICD-10-CM | POA: Diagnosis not present

## 2018-07-04 DIAGNOSIS — M25651 Stiffness of right hip, not elsewhere classified: Secondary | ICD-10-CM

## 2018-07-04 NOTE — Therapy (Signed)
[facility] [address] [place], [place], [address] Phone: [phone]   Fax:  [phone]  Physical Therapy Treatment  Patient Details  Name: Dave Robles MRN: [id number] Date of Birth: 12/27/1945 Referring Provider: Frank Aluisio MD   Encounter Date: 07/04/2018  PT End of Session - 07/04/18 0908    Visit Number  6    Number of Visits  16    Date for PT Re-Evaluation  07/18/18    PT Start Time  0845    PT Stop Time  0925    PT Time Calculation (min)  40 min       Past Medical History:  Diagnosis Date  . AAA (abdominal aortic aneurysm) (HCC)   . Adjustment disorder with depressed mood 03/04/2009  . Arthritis   . Colon polyps   . CORONARY ARTERY DISEASE 07/10/2007  . Depression   . ERECTILE DYSFUNCTION 07/10/2007  . Extrinsic asthma, unspecified 09/10/2008  . HYPERLIPIDEMIA 07/10/2007  . HYPERTENSION 07/10/2007  . Myocardial infarction (HCC) 2016   when had angioplasty- was told had a silent MI  . TOBACCO USE 12/15/2009    Past Surgical History:  Procedure Laterality Date  . COLONOSCOPY    . CORONARY ANGIOPLASTY  12/2014  . CORONARY ARTERY BYPASS GRAFT  11/15/89   5 vessels  . HYDROCELE EXCISION / REPAIR  2011  . INGUINAL HERNIA REPAIR Left 1985  . TOTAL HIP ARTHROPLASTY Right 04/19/2018   Procedure: RIGHT TOTAL HIP ARTHROPLASTY ANTERIOR APPROACH;  Surgeon: Aluisio, Frank, MD;  Location: [facility];  Service: Orthopedics;  Laterality: Right;    There were no vitals filed for this visit.  Subjective Assessment - 07/04/18 0859    Subjective  overall doing much better. still with walking distance I fatigue.    Currently in Pain?  Yes    Pain Score  2     Pain Location  Hip    Pain Orientation  Right                       [facility] Adult PT Treatment/Exercise - 07/04/18 0001      Exercises   Exercises  Knee/Hip      Knee/Hip Exercises: Supine   Other Supine Knee/Hip Exercises  RT LE  strengthening      Moist Heat Therapy   Number Minutes Moist Heat  15 Minutes    Moist Heat Location  Hip      Electrical Stimulation   Electrical Stimulation Location  RT quad/IT    Electrical Stimulation Action  IFC    Electrical Stimulation Parameters  supine    Electrical Stimulation Goals  Pain      Manual Therapy   Manual Therapy  Soft tissue mobilization;Passive ROM    Manual therapy comments  decreased tightness with stretching but trigger points noted    Soft tissue mobilization  glut and IT       Trigger Point Dry Needling - 07/04/18 0915    Consent Given?  Yes    Muscles Treated Lower Body  Quadriceps;Gluteus maximus    Gluteus Maximus Response  Twitch response elicited    Quadriceps Response  Twitch response elicited;Palpable increased muscle length             PT Short Term Goals - 06/01/18 1128      PT SHORT TERM GOAL #1   Title  I with Initial HEP for flexiblity     Status    Achieved      PT SHORT TERM GOAL #2   Title  Patient able to ascend full flight of stairs with reciprocal gait pattern.    Status  Partially Met        PT Long Term Goals - 07/04/18 0908      PT LONG TERM GOAL #1   Title  Patient able to amb community distances without AD and minimal gait deviations.    Status  Partially Met      PT LONG TERM GOAL #2   Title  patient able to stand and walk without hip tightness.    Status  Partially Met      PT LONG TERM GOAL #3   Title  Patient able to descend stairs with a reciprocal gait pattern with UE support    Status  Achieved      PT LONG TERM GOAL #4   Title  Patient to demonstrate improved trunk rotation with gait and to allow return to sport    Status  Partially Met            Plan - 07/04/18 0908    Clinical Impression Statement  overall pt is doing very well and progresing towards goals. pt back to rideing bike and in pool. RT hip strength has improved as well as ROM but still with trigger points in RT glut and IT.      PT Treatment/Interventions  ADLs/Self Care Home Management;Gait training;Stair training;Therapeutic exercise;Therapeutic activities;Neuromuscular re-education;Patient/family education;Passive range of motion;Manual techniques    PT Next Visit Plan  recommend and few more sessions as he responds well to STW and DN       Patient will benefit from skilled therapeutic intervention in order to improve the following deficits and impairments:  Abnormal gait, Decreased strength, Decreased range of motion, Impaired flexibility, Postural dysfunction  Visit Diagnosis: Muscle weakness (generalized)  Stiffness of right hip, not elsewhere classified     Problem List Patient Active Problem List   Diagnosis Date Noted  . OA (osteoarthritis) of hip 04/19/2018  . AAA (abdominal aortic aneurysm) without rupture (HCC) 01/19/2017  . Pain in joint of left knee 01/19/2017  . Episodic mood disorder (HCC) 08/01/2014  . Personal history of colonic polyps 07/08/2014  . Onychomycosis 05/14/2014  . Hyperlipidemia 07/10/2007  . ERECTILE DYSFUNCTION 07/10/2007  . Essential hypertension 07/10/2007  . Coronary atherosclerosis 07/10/2007    ALBRIGHT,MICHAEL W., PT 07/04/2018, 9:16 AM  [facility] [address] [place], [place], [address] Phone: [phone]   Fax:  [phone]  Name: Dave Robles MRN: [id number] Date of Birth: 09/07/1946   

## 2018-07-06 ENCOUNTER — Encounter: Payer: Self-pay | Admitting: Physical Therapy

## 2018-07-18 ENCOUNTER — Encounter: Payer: Self-pay | Admitting: Physical Therapy

## 2018-07-18 ENCOUNTER — Ambulatory Visit: Payer: Medicare HMO | Attending: Orthopedic Surgery | Admitting: Physical Therapy

## 2018-07-18 DIAGNOSIS — M6281 Muscle weakness (generalized): Secondary | ICD-10-CM | POA: Insufficient documentation

## 2018-07-18 DIAGNOSIS — M25651 Stiffness of right hip, not elsewhere classified: Secondary | ICD-10-CM | POA: Diagnosis not present

## 2018-07-18 NOTE — Therapy (Signed)
[place] [address], [place], [address] Phone: [phone]   Fax:  [phone]  Physical Therapy Treatment  Patient Details  Name: Dave Robles MRN: [id number] Date of Birth: November 10, 1946 Referring Provider: Gaynelle Arabian MD   Encounter Date: 07/18/2018  PT End of Session - 07/18/18 0905    Visit Number  7    Number of Visits  16    Date for PT Re-Evaluation  07/18/18    PT Start Time  0839    PT Stop Time  0906    PT Time Calculation (min)  27 min       Past Medical History:  Diagnosis Date  . AAA (abdominal aortic aneurysm) ([place])   . Adjustment disorder with depressed mood 03/04/2009  . Arthritis   . Colon polyps   . CORONARY ARTERY DISEASE 07/10/2007  . Depression   . ERECTILE DYSFUNCTION 07/10/2007  . Extrinsic asthma, unspecified 09/10/2008  . HYPERLIPIDEMIA 07/10/2007  . HYPERTENSION 07/10/2007  . Myocardial infarction [facility]) 2016   when had angioplasty- was told had a silent MI  . TOBACCO USE 12/15/2009    Past Surgical History:  Procedure Laterality Date  . COLONOSCOPY    . CORONARY ANGIOPLASTY  12/2014  . CORONARY ARTERY BYPASS GRAFT  11/15/89   5 vessels  . HYDROCELE EXCISION / REPAIR  2011  . INGUINAL HERNIA REPAIR Left 1985  . TOTAL HIP ARTHROPLASTY Right 04/19/2018   Procedure: RIGHT TOTAL HIP ARTHROPLASTY ANTERIOR APPROACH;  Surgeon: Gaynelle Arabian, MD;  Location: [facility];  Service: Orthopedics;  Laterality: Right;    There were no vitals filed for this visit.  Subjective Assessment - 07/18/18 0843    Subjective  overall good just wtill soreness and fatigue with long distance walking    Currently in Pain?  No/denies                       [facility] Adult PT Treatment/Exercise - 07/18/18 0001      Exercises   Exercises  Knee/Hip      Knee/Hip Exercises: Machines for Strengthening   Cybex Knee Extension  25# 3 sets 10    Cybex Knee Flexion  35# 2 sets 15    Cybex  Leg Press  70# 2 sets 15      Knee/Hip Exercises: Standing   Other Standing Knee Exercises  multi hip abd ex in standing   3#     Knee/Hip Exercises: Supine   Other Supine Knee/Hip Exercises  RT SLR witnh abd,abd with circles.             PT Education - 07/18/18 [phone]    Education Details  reviewed and discussed all HEPs and ex machines for gym, educ to start adding machines at gym including leg press, knee ext and flex, hip abd/add    Person(s) Educated  Patient    Methods  Explanation;Demonstration;Handout    Comprehension  Verbalized understanding;Returned demonstration       PT Short Term Goals - 07/18/18 0904      PT SHORT TERM GOAL #2   Title  Patient able to ascend full flight of stairs with reciprocal gait pattern.    Status  Achieved        PT Long Term Goals - 07/18/18 0904      PT LONG TERM GOAL #1   Title  Patient able to amb community distances without AD and minimal gait deviations.  Status  Partially Met      PT LONG TERM GOAL #2   Title  patient able to stand and walk without hip tightness.    Status  Achieved      PT LONG TERM GOAL #3   Title  Patient able to descend stairs with a reciprocal gait pattern with UE support    Status  Achieved      PT LONG TERM GOAL #4   Title  Patient to demonstrate improved trunk rotation with gait and to allow return to sport    Status  Achieved            Plan - 07/18/18 0905    Clinical Impression Statement  pt is very pleased with progress and doing many ex at home. less tightness with increased strength- minimal weakness hip flex and abd so educ to add wt machines at gym. all goals met except gait goal -this when pt feels weakness and pain after gait, discussed strengthing should eliminate this issue    PT Treatment/Interventions  ADLs/Self Care Home Management;Gait training;Stair training;Therapeutic exercise;Therapeutic activities;Neuromuscular re-education;Patient/family education;Passive range of  motion;Manual techniques    PT Next Visit Plan  HOLD and assess how pian/weakness is a several weeks once adding wt machines at gym       Patient will benefit from skilled therapeutic intervention in order to improve the following deficits and impairments:  Abnormal gait, Decreased strength, Decreased range of motion, Impaired flexibility, Postural dysfunction  Visit Diagnosis: Muscle weakness (generalized)  Stiffness of right hip, not elsewhere classified     Problem List Patient Active Problem List   Diagnosis Date Noted  . OA (osteoarthritis) of hip 04/19/2018  . AAA (abdominal aortic aneurysm) without rupture ([place]) 01/19/2017  . Pain in joint of left knee 01/19/2017  . Episodic mood disorder ([place]) 08/01/2014  . Personal history of colonic polyps 07/08/2014  . Onychomycosis 05/14/2014  . Hyperlipidemia 07/10/2007  . ERECTILE DYSFUNCTION 07/10/2007  . Essential hypertension 07/10/2007  . Coronary atherosclerosis 07/10/2007    PAYSEUR,ANGIE PTA 07/18/2018, 9:08 AM  [place] [address] [place], [place], [address] Phone: [phone]   Fax:  [phone]  Name: Dave Robles MRN: [id number] Date of Birth: [birth year]

## 2018-07-24 ENCOUNTER — Ambulatory Visit: Payer: Medicare HMO | Admitting: Nurse Practitioner

## 2018-07-24 ENCOUNTER — Other Ambulatory Visit: Payer: Self-pay | Admitting: *Deleted

## 2018-07-24 ENCOUNTER — Encounter: Payer: Self-pay | Admitting: Nurse Practitioner

## 2018-07-24 VITALS — BP 128/80 | HR 58 | Ht 70.0 in | Wt 245.1 lb

## 2018-07-24 DIAGNOSIS — I259 Chronic ischemic heart disease, unspecified: Secondary | ICD-10-CM | POA: Diagnosis not present

## 2018-07-24 DIAGNOSIS — I10 Essential (primary) hypertension: Secondary | ICD-10-CM | POA: Diagnosis not present

## 2018-07-24 DIAGNOSIS — I2510 Atherosclerotic heart disease of native coronary artery without angina pectoris: Secondary | ICD-10-CM | POA: Diagnosis not present

## 2018-07-24 DIAGNOSIS — E7800 Pure hypercholesterolemia, unspecified: Secondary | ICD-10-CM | POA: Diagnosis not present

## 2018-07-24 DIAGNOSIS — I714 Abdominal aortic aneurysm, without rupture, unspecified: Secondary | ICD-10-CM

## 2018-07-24 NOTE — Progress Notes (Signed)
CARDIOLOGY OFFICE NOTE  Date:  07/24/2018    Duaine Robles Date of Birth: 10-Dec-1945 Medical Record #[id number]  PCP:  Dorothyann Peng, NP  Cardiologist:  Servando Snare   Chief Complaint  Patient presents with  . Hypertension  . Hyperlipidemia  . Coronary Artery Disease    6 month check    History of Present Illness: DAVAUGHN Robles is a 72 y.o. male who presents today for a 6 month check. Former patient of Dr. Claris Gladden - typically follows with me.  He has HTN, HLD, CAD with remote CABG in 1991, negative Myoview in 2011 with EF 67% and no ischemia, ED, tobacco abuse, asthma and depression.Small AAA noted on duplex study from December of 2018.  Seen in April of 2014 - he had had a brief syncopal spell six weeks prior to that visit - Myoview was updated - this looked good.  Seen back in March of 2016 after being out in [place] for a long vacation the month prior. Had been exercising pretty hard - riding his bike about 15 miles per day. One day he just did not feel well. No real chest pain but was concerned and went to the ER. Told them he had CAD. EKG was ok. Troponin was +. He was cathed - noted to have NL left main, 90% prox LAD, 99% mid LAD with patent LIMA to mid LAD and patent SVG to DX. LCX origin stenosis vs flow void? Fistula vs competitive flow in branch?, 100% RCA, SVG to RCA 99% origin treated with Xience DES and PTCA to the distal. He is on Plavix. He was told that "there was something squirting" and that he possibly needs a CT scan???? Placed on Metoprolol.   Hiscath CD was from [place] was reviewed with Dr. 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 Burt Knack did NOT feel any treatment is necessary - this is oxygenated blood going to oxygenated blood.  I increased his beta blocker and advised that we needed to up titrate his medicines due to his LV dysfunction. His EF was 45 to 50%.   Last seen by me back in  March - he had been to [place] for 2 months.  No actual chest pain - but he has never had a chest pain syndrome - had more of a "harder to work" with his CABG and with his last cardiac event a few days years ago - noted "just did not feel right". He did note some atypical tingling in the left shoulder - needing pre op clearance for right hip surgery - we ended up getting his Myoview updated. This turned out ok.   Comes in today. [place]. He had his right hip surgery back in the summer - did well. He feels good. Headed to [place] in a few weeks and again this winter - has a kitchen remodel going on. No chest pain (but never had). Not short of breath. Back exercising. Weight is down a few pounds. He has done pretty well with his rehab. Riding his bike. For his physical in November and will have his labs at that time.  Overall, he has no real concerns other than wanting to get his duplex for his AAA arranged for December.   Past Medical History:  Diagnosis Date  . AAA (abdominal aortic aneurysm) ([place])   . Adjustment disorder with depressed mood 03/04/2009  . Arthritis   . Colon polyps   .  CORONARY ARTERY DISEASE 07/10/2007  . Depression   . ERECTILE DYSFUNCTION 07/10/2007  . Extrinsic asthma, unspecified 09/10/2008  . HYPERLIPIDEMIA 07/10/2007  . HYPERTENSION 07/10/2007  . Myocardial infarction [facility]) 2016   when had angioplasty- was told had a silent MI  . TOBACCO USE 12/15/2009    Past Surgical History:  Procedure Laterality Date  . COLONOSCOPY    . CORONARY ANGIOPLASTY  12/2014  . CORONARY ARTERY BYPASS GRAFT  11/15/89   5 vessels  . HYDROCELE EXCISION / REPAIR  2011  . INGUINAL HERNIA REPAIR Left 1985  . TOTAL HIP ARTHROPLASTY Right 04/19/2018   Procedure: RIGHT TOTAL HIP ARTHROPLASTY ANTERIOR APPROACH;  Surgeon: Gaynelle Arabian, MD;  Location: [facility];  Service: Orthopedics;  Laterality: Right;     Medications: Current Meds  Medication Sig  . acetaminophen (TYLENOL) 325 MG tablet Take  650 mg by mouth every 6 (six) hours as needed for moderate pain.  Marland Kitchen aspirin 81 MG tablet Take 81 mg by mouth daily.    Marland Kitchen atorvastatin (LIPITOR) 80 MG tablet TAKE 1 TABLET (80 MG TOTAL) BY MOUTH DAILY.  Marland Kitchen buPROPion (WELLBUTRIN XL) 150 MG 24 hr tablet Take 150 mg by mouth daily.  . clopidogrel (PLAVIX) 75 MG tablet Take 1 tablet (75 mg total) by mouth daily.  . cyclobenzaprine (FLEXERIL) 10 MG tablet TAKE 1 TABLET BY MOUTH NIGHTLY AT BEDTIME AS NEEDED FOR MUSCLE SPASM  . lisinopril-hydrochlorothiazide (PRINZIDE,ZESTORETIC) 20-12.5 MG tablet Take 1 tablet by mouth daily.  . metoprolol tartrate (LOPRESSOR) 25 MG tablet Take 1 tablet (25 mg total) by mouth 2 (two) times daily.  . Multiple Vitamin (MULTIVITAMIN) capsule Take 1 capsule by mouth daily.    . naproxen sodium (ALEVE) 220 MG tablet Take 220 mg by mouth daily as needed.  . nitroGLYCERIN (NITROSTAT) 0.4 MG SL tablet Place 1 tablet (0.4 mg total) under the tongue every 5 (five) minutes as needed.  . sildenafil (VIAGRA) 100 MG tablet Take 100 mg by mouth daily as needed for erectile dysfunction.     Allergies: No Known Allergies  Social History: The patient  reports that he quit smoking about 8 years ago. He smoked 0.50 packs per day. He has never used smokeless tobacco. He reports that he drinks about 5.0 standard drinks of alcohol per week. He reports that he does not use drugs.   Family History: The patient's family history includes Cancer in his mother; Coronary artery disease in his father; Depression in his mother and paternal grandfather; Hyperlipidemia in his other; Hypertension in his other.   Review of Systems: Please see the history of present illness.   Otherwise, the review of systems is positive for none.   All other systems are reviewed and negative.   Physical Exam: VS:  BP 128/80 (BP Location: Left Arm, Patient Position: Sitting, Cuff Size: Normal)   Pulse (!) 58   Ht 5\' 10"  (1.778 m)   Wt 245 lb 1.9 oz (111.2 kg)    SpO2 97% Comment: at rest  BMI 35.17 kg/m  .  BMI Body mass index is 35.17 kg/m.  Wt Readings from Last 3 Encounters:  07/24/18 245 lb 1.9 oz (111.2 kg)  04/19/18 250 lb (113.4 kg)  04/11/18 250 lb 3.2 oz (113.5 kg)    General: Pleasant. Well developed, well nourished and in no acute distress.  Weight is down 5 pounds.  HEENT: Normal.  Neck: Supple, no JVD, carotid bruits, or masses noted.  Cardiac: Regular rate and rhythm. No murmurs, rubs,  or gallops. No edema.  Respiratory:  Lungs are clear to auscultation bilaterally with normal work of breathing.  GI: Soft and nontender.  MS: No deformity or atrophy. Gait and ROM intact.  Skin: Warm and dry. Color is normal.  Neuro:  Strength and sensation are intact and no gross focal deficits noted.  Psych: Alert, appropriate and with normal affect.   LABORATORY DATA:  EKG:  EKG is not ordered today.  Lab Results  Component Value Date   WBC 17.9 (H) 04/21/2018   HGB 13.7 04/21/2018   HCT 40.1 04/21/2018   PLT 145 (L) 04/21/2018   GLUCOSE 97 04/21/2018   CHOL 129 10/04/2017   TRIG 185.0 (H) 10/04/2017   HDL 33.40 (L) 10/04/2017   LDLDIRECT 66.7 11/30/2011   LDLCALC 59 10/04/2017   ALT 22 04/11/2018   AST 22 04/11/2018   NA 141 04/21/2018   K 4.2 04/21/2018   CL 105 04/21/2018   CREATININE 1.08 04/21/2018   BUN 21 (H) 04/21/2018   CO2 28 04/21/2018   TSH 2.16 08/20/2016   PSA 0.88 10/04/2017   INR 1.06 04/11/2018     BNP (last 3 results) No results for input(s): BNP in the last 8760 hours.  ProBNP (last 3 results) No results for input(s): PROBNP in the last 8760 hours.   Other Studies Reviewed Today:  Myoview Study Highlights 02/2018    Nuclear stress EF: 54%.  There is a small defect of mild severity present in the basal inferior location. The defect is non-reversible and consistent with diaphragmatic attenuation artifact. No ischemia noted.  The left ventricular ejection fraction is mildly decreased  (45-54%).  Compared to study of 2014, fixed basal inferior defect is new.  There was no ST segment deviation noted during stress.  This is a low risk study.    Echo Study Conclusions from 03/2015  - Left ventricle: The cavity size was normal. Systolic function was normal. The estimated ejection fraction was in the range of 55% to 60%. Wall motion was normal; there were no regional wall motion abnormalities. Left ventricular diastolic function parameters were normal. - Left atrium: The atrium was mildly dilated. - Atrial septum: No defect or patent foramen ovale was identified  Assessment/Plan:  1. CAD/prior NSTEMI - with PCI to SVG to RCA in March of 2016- EF 45%. Cath film wasreviewed with Dr. Burt Knack previously. No further intervention felt to be needed and it was advised to continue with medical management - he has been continued on DAPT.  He has never had chest pain syndrome. He has had a stable Myoview from earlier this year. He continues to do well. No changes made today.   2. Mild LV dysfunction - with resolution per echo from May of 2016. He remains without symptoms. No changes today.   3. HLD - on statin -labs from last November noted. He will be having his physical and labs in November.   4. HTN - BP looks good today - no changes made.   5. Obesity - getting back on track. Weight is down 5 pounds.    6. AAA - he had his study in December of 2018 - some increase in size noted - currently 3.6cm - we will arrange to get repeated this December.   Current medicines are reviewed with the patient today.  The patient does not have concerns regarding medicines other than what has been noted above.  The following changes have been made:  See above.  Labs/ tests ordered  today include:   No orders of the defined types were placed in this encounter.    Disposition:   FU with me in 6 months.   Patient is agreeable to this plan and will call if any problems  develop in the interim.   SignedTruitt Merle, NP  07/24/2018 9:27 AM  [place] [address] [place], [place]  [address] Phone: [phone] Fax: [phone]

## 2018-07-24 NOTE — Patient Instructions (Addendum)
We will be checking the following labs today - NONE   Medication Instructions:    Continue with your current medicines.     Testing/Procedures To Be Arranged:  AAA duplex in December  Follow-Up:   See me in 6 months    Other Special Instructions:   N/A    If you need a refill on your cardiac medications before your next appointment, please call your pharmacy.   Call the [place] office at [phone] if you have any questions, problems or concerns.

## 2018-07-26 ENCOUNTER — Other Ambulatory Visit: Payer: Self-pay | Admitting: Adult Health

## 2018-08-09 DIAGNOSIS — H40023 Open angle with borderline findings, high risk, bilateral: Secondary | ICD-10-CM | POA: Diagnosis not present

## 2018-09-13 ENCOUNTER — Encounter: Payer: Self-pay | Admitting: Adult Health

## 2018-09-15 ENCOUNTER — Encounter: Payer: Self-pay | Admitting: Adult Health

## 2018-09-15 ENCOUNTER — Ambulatory Visit (INDEPENDENT_AMBULATORY_CARE_PROVIDER_SITE_OTHER): Payer: Medicare HMO | Admitting: Adult Health

## 2018-09-15 VITALS — BP 122/70 | Temp 98.0°F | Ht 69.5 in | Wt 245.0 lb

## 2018-09-15 DIAGNOSIS — J069 Acute upper respiratory infection, unspecified: Secondary | ICD-10-CM

## 2018-09-15 MED ORDER — DOXYCYCLINE HYCLATE 100 MG PO CAPS: 100.0000 mg | ORAL_CAPSULE | Freq: Two times a day (BID) | ORAL | 0 refills | Status: DC

## 2018-09-15 MED ORDER — METHYLPREDNISOLONE 4 MG PO TBPK: ORAL_TABLET | ORAL | 0 refills | Status: DC

## 2018-09-15 MED ORDER — HYDROCODONE-HOMATROPINE 5-1.5 MG/5ML PO SYRP
5.0000 mL | ORAL_SOLUTION | Freq: Three times a day (TID) | ORAL | 0 refills | Status: DC | PRN
Start: 2018-09-15 — End: 2018-09-28

## 2018-09-15 NOTE — Progress Notes (Signed)
Subjective:    Patient ID: Dave Robles, male    DOB: 1946/01/03, 72 y.o.   MRN: [id number]  URI   This is a new problem. The current episode started 1 to 4 weeks ago (2 weeks ). The problem has been waxing and waning. There has been no fever. Associated symptoms include congestion, coughing (Semi productive ), rhinorrhea (discolored) and wheezing. Pertinent negatives include no ear pain, headaches, plugged ear sensation, sinus pain, sore throat or swollen glands. He has tried nothing for the symptoms. The treatment provided no relief.     Review of Systems  Constitutional: Negative.   HENT: Positive for congestion, postnasal drip and rhinorrhea (discolored). Negative for ear pain, sinus pressure, sinus pain and sore throat.   Respiratory: Positive for cough (Semi productive ), chest tightness and wheezing. Negative for shortness of breath and stridor.   Cardiovascular: Negative.   Neurological: Negative for headaches.   Past Medical History:  Diagnosis Date  . AAA (abdominal aortic aneurysm) ([place])   . Adjustment disorder with depressed mood 03/04/2009  . Arthritis   . Colon polyps   . CORONARY ARTERY DISEASE 07/10/2007  . Depression   . ERECTILE DYSFUNCTION 07/10/2007  . Extrinsic asthma, unspecified 09/10/2008  . HYPERLIPIDEMIA 07/10/2007  . HYPERTENSION 07/10/2007  . Myocardial infarction [facility]) 2016   when had angioplasty- was told had a silent MI  . TOBACCO USE 12/15/2009    Social History   Socioeconomic History  . Marital status: Married    Spouse name: Not on file  . Number of children: Not on file  . Years of education: Not on file  . Highest education level: Not on file  Occupational History  . Not on file  Social Needs  . Financial resource strain: Not on file  . Food insecurity:    Worry: Not on file    Inability: Not on file  . Transportation needs:    Medical: Not on file    Non-medical: Not on file  Tobacco Use  . Smoking status: Former Smoker   Packs/day: 0.50    Last attempt to quit: 01/13/2010    Years since quitting: 8.6  . Smokeless tobacco: Never Used  Substance and Sexual Activity  . Alcohol use: Yes    Alcohol/week: 5.0 standard drinks    Types: 5 Cans of beer per week  . Drug use: No  . Sexual activity: Yes    Comment: regular exercise - yes  Lifestyle  . Physical activity:    Days per week: Not on file    Minutes per session: Not on file  . Stress: Not on file  Relationships  . Social connections:    Talks on phone: Not on file    Gets together: Not on file    Attends religious service: Not on file    Active member of club or organization: Not on file    Attends meetings of clubs or organizations: Not on file    Relationship status: Not on file  . Intimate partner violence:    Fear of current or ex partner: Not on file    Emotionally abused: Not on file    Physically abused: Not on file    Forced sexual activity: Not on file  Other Topics Concern  . Not on file  Social History Narrative   Retired from being an Financial controller of a small business    Married    No kids       He likes to  play golf and rides bike        Past Surgical History:  Procedure Laterality Date  . COLONOSCOPY    . CORONARY ANGIOPLASTY  12/2014  . CORONARY ARTERY BYPASS GRAFT  11/15/89   5 vessels  . HYDROCELE EXCISION / REPAIR  2011  . INGUINAL HERNIA REPAIR Left 1985  . TOTAL HIP ARTHROPLASTY Right 04/19/2018   Procedure: RIGHT TOTAL HIP ARTHROPLASTY ANTERIOR APPROACH;  Surgeon: Gaynelle Arabian, MD;  Location: [facility];  Service: Orthopedics;  Laterality: Right;    Family History  Problem Relation Age of Onset  . Coronary artery disease Father        cabg  . Cancer Mother        Breast and or lung   . Depression Mother   . Hyperlipidemia Other   . Hypertension Other   . Depression Paternal Grandfather   . Colon cancer Neg Hx     No Known Allergies  Current Outpatient Medications on File Prior to Visit  Medication Sig Dispense  Refill  . acetaminophen (TYLENOL) 325 MG tablet Take 650 mg by mouth every 6 (six) hours as needed for moderate pain.    Marland Kitchen aspirin 81 MG tablet Take 81 mg by mouth daily.      Marland Kitchen atorvastatin (LIPITOR) 80 MG tablet TAKE 1 TABLET (80 MG TOTAL) BY MOUTH DAILY. 90 tablet 2  . buPROPion (WELLBUTRIN XL) 150 MG 24 hr tablet Take 150 mg by mouth daily.    . clopidogrel (PLAVIX) 75 MG tablet Take 1 tablet (75 mg total) by mouth daily. 90 tablet 2  . cyclobenzaprine (FLEXERIL) 10 MG tablet TAKE ONE TABLET BY MOUTH NIGHTLY AT BEDTIME AS NEEDED FOR MUSCLE SPASM 90 tablet 0  . lisinopril-hydrochlorothiazide (PRINZIDE,ZESTORETIC) 20-12.5 MG tablet Take 1 tablet by mouth daily. 90 tablet 2  . metoprolol tartrate (LOPRESSOR) 25 MG tablet Take 1 tablet (25 mg total) by mouth 2 (two) times daily. 180 tablet 2  . Multiple Vitamin (MULTIVITAMIN) capsule Take 1 capsule by mouth daily.      . naproxen sodium (ALEVE) 220 MG tablet Take 220 mg by mouth daily as needed.    . nitroGLYCERIN (NITROSTAT) 0.4 MG SL tablet Place 1 tablet (0.4 mg total) under the tongue every 5 (five) minutes as needed. 25 tablet 1  . sildenafil (VIAGRA) 100 MG tablet Take 100 mg by mouth daily as needed for erectile dysfunction.     No current facility-administered medications on file prior to visit.     BP 122/70   Temp 98 F (36.7 C)   Ht 5' 9.5" (1.765 m)   Wt 245 lb (111.1 kg)   BMI 35.66 kg/m       Objective:   Physical Exam  Constitutional: He appears well-developed and well-nourished. No distress.  HENT:  Head: Normocephalic and atraumatic.  Right Ear: External ear normal.  Left Ear: External ear normal.  Nose: Nose normal.  Mouth/Throat: Uvula is midline. Oropharyngeal exudate present.  + PND    Eyes: Pupils are equal, round, and reactive to light. Conjunctivae and EOM are normal. Right eye exhibits no discharge. Left eye exhibits no discharge. No scleral icterus.  Neck: Normal range of motion. Neck supple.    Cardiovascular: Normal rate, regular rhythm, normal heart sounds and intact distal pulses.  Pulmonary/Chest: Effort normal. No respiratory distress. He has wheezes (trace throughout ). He exhibits no tenderness.  Neurological: He is alert.  Skin: He is not diaphoretic.  Psychiatric: He has a normal mood  and affect. His behavior is normal. Judgment and thought content normal.  Nursing note and vitals reviewed.     Assessment & Plan:  1. Upper respiratory tract infection, unspecified type - HYDROcodone-homatropine (HYCODAN) 5-1.5 MG/5ML syrup; Take 5 mLs by mouth every 8 (eight) hours as needed for cough.  Dispense: 120 mL; Refill: 0 - doxycycline (VIBRAMYCIN) 100 MG capsule; Take 1 capsule (100 mg total) by mouth 2 (two) times daily.  Dispense: 14 capsule; Refill: 0 - methylPREDNISolone (MEDROL DOSEPAK) 4 MG TBPK tablet; Take as directed  Dispense: 21 tablet; Refill: 0 - Follow up in 2-3 days if no improvement   Dorothyann Peng, NP

## 2018-09-21 DIAGNOSIS — H40053 Ocular hypertension, bilateral: Secondary | ICD-10-CM | POA: Diagnosis not present

## 2018-09-21 DIAGNOSIS — H43812 Vitreous degeneration, left eye: Secondary | ICD-10-CM | POA: Diagnosis not present

## 2018-09-21 DIAGNOSIS — H35033 Hypertensive retinopathy, bilateral: Secondary | ICD-10-CM | POA: Diagnosis not present

## 2018-09-21 DIAGNOSIS — H40023 Open angle with borderline findings, high risk, bilateral: Secondary | ICD-10-CM | POA: Diagnosis not present

## 2018-09-22 ENCOUNTER — Encounter: Payer: Self-pay | Admitting: Adult Health

## 2018-09-22 NOTE — Telephone Encounter (Signed)
Pt notified via mychart message of recommendations.  Nothing further needed.

## 2018-09-22 NOTE — Telephone Encounter (Signed)
Please advise Tommi Rumps, thanks.

## 2018-09-22 NOTE — Telephone Encounter (Signed)
Assessment & Plan:  1. Upper respiratory tract infection, unspecified type - HYDROcodone-homatropine (HYCODAN) 5-1.5 MG/5ML syrup; Take 5 mLs by mouth every 8 (eight) hours as needed for cough.  Dispense: 120 mL; Refill: 0 - doxycycline (VIBRAMYCIN) 100 MG capsule; Take 1 capsule (100 mg total) by mouth 2 (two) times daily.  Dispense: 14 capsule; Refill: 0 - methylPREDNISolone (MEDROL DOSEPAK) 4 MG TBPK tablet; Take as directed  Dispense: 21 tablet; Refill: 0 - Follow up in 2-3 days if no improvement   Dave Peng, NP  -----  Notes stated to follow up if no Improvement in 2-3 days Cough is almost resolved, still having chest congestion and mucous production though. Pt has some old cough syrup on hand. Is there something additional he can take to help with symptoms that are left or does he need to come on in this afternoon?

## 2018-09-28 ENCOUNTER — Encounter: Payer: Self-pay | Admitting: Adult Health

## 2018-09-28 ENCOUNTER — Ambulatory Visit (INDEPENDENT_AMBULATORY_CARE_PROVIDER_SITE_OTHER): Payer: Medicare HMO | Admitting: Adult Health

## 2018-09-28 VITALS — BP 128/80 | Temp 98.4°F | Wt 242.0 lb

## 2018-09-28 DIAGNOSIS — R0982 Postnasal drip: Secondary | ICD-10-CM | POA: Diagnosis not present

## 2018-09-28 DIAGNOSIS — R059 Cough, unspecified: Secondary | ICD-10-CM

## 2018-09-28 DIAGNOSIS — R05 Cough: Secondary | ICD-10-CM

## 2018-09-28 DIAGNOSIS — Z23 Encounter for immunization: Secondary | ICD-10-CM

## 2018-09-28 MED ORDER — AZELASTINE HCL 0.1 % NA SOLN: 2.0000 | Freq: Two times a day (BID) | NASAL | 1 refills | Status: DC

## 2018-09-28 NOTE — Addendum Note (Signed)
Addended by: Miles Costain T on: 09/28/2018 07:58 AM   Modules accepted: Orders

## 2018-09-28 NOTE — Progress Notes (Addendum)
Subjective:    Patient ID: Dave Robles, male    DOB: 1946-07-16, 72 y.o.   MRN: [id number]  HPI 72 year old male who  has a past medical history of AAA (abdominal aortic aneurysm) ([place]), Adjustment disorder with depressed mood (03/04/2009), Arthritis, Colon polyps, CORONARY ARTERY DISEASE (07/10/2007), Depression, ERECTILE DYSFUNCTION (07/10/2007), Extrinsic asthma, unspecified (09/10/2008), HYPERLIPIDEMIA (07/10/2007), HYPERTENSION (07/10/2007), Myocardial infarction ([place]) (2016), and TOBACCO USE (12/15/2009).  He presents to the office today for follow up regarding URI like symptoms. He was seen about two weeks ago.  With suspected URI. He was prescribed a course of prednisone and doxycycline. He has finished his course but continues to have a productive cough and sinus congestion.   He denies fevers, fatigue, feeling ill, ear pain, sinus pain/pressure.   He has also been using Flonase and recently started Claritin but has not noticed any improvement    Review of Systems See HPI   Past Medical History:  Diagnosis Date  . AAA (abdominal aortic aneurysm) ([place])   . Adjustment disorder with depressed mood 03/04/2009  . Arthritis   . Colon polyps   . CORONARY ARTERY DISEASE 07/10/2007  . Depression   . ERECTILE DYSFUNCTION 07/10/2007  . Extrinsic asthma, unspecified 09/10/2008  . HYPERLIPIDEMIA 07/10/2007  . HYPERTENSION 07/10/2007  . Myocardial infarction [facility]) 2016   when had angioplasty- was told had a silent MI  . TOBACCO USE 12/15/2009    Social History   Socioeconomic History  . Marital status: Married    Spouse name: Not on file  . Number of children: Not on file  . Years of education: Not on file  . Highest education level: Not on file  Occupational History  . Not on file  Social Needs  . Financial resource strain: Not on file  . Food insecurity:    Worry: Not on file    Inability: Not on file  . Transportation needs:    Medical: Not on file    Non-medical: Not on  file  Tobacco Use  . Smoking status: Former Smoker    Packs/day: 0.50    Last attempt to quit: 01/13/2010    Years since quitting: 8.7  . Smokeless tobacco: Never Used  Substance and Sexual Activity  . Alcohol use: Yes    Alcohol/week: 5.0 standard drinks    Types: 5 Cans of beer per week  . Drug use: No  . Sexual activity: Yes    Comment: regular exercise - yes  Lifestyle  . Physical activity:    Days per week: Not on file    Minutes per session: Not on file  . Stress: Not on file  Relationships  . Social connections:    Talks on phone: Not on file    Gets together: Not on file    Attends religious service: Not on file    Active member of club or organization: Not on file    Attends meetings of clubs or organizations: Not on file    Relationship status: Not on file  . Intimate partner violence:    Fear of current or ex partner: Not on file    Emotionally abused: Not on file    Physically abused: Not on file    Forced sexual activity: Not on file  Other Topics Concern  . Not on file  Social History Narrative   Retired from being an Financial controller of a small business    Married    No kids  He likes to play golf and rides bike        Past Surgical History:  Procedure Laterality Date  . COLONOSCOPY    . CORONARY ANGIOPLASTY  12/2014  . CORONARY ARTERY BYPASS GRAFT  11/15/89   5 vessels  . HYDROCELE EXCISION / REPAIR  2011  . INGUINAL HERNIA REPAIR Left 1985  . TOTAL HIP ARTHROPLASTY Right 04/19/2018   Procedure: RIGHT TOTAL HIP ARTHROPLASTY ANTERIOR APPROACH;  Surgeon: Gaynelle Arabian, MD;  Location: [facility];  Service: Orthopedics;  Laterality: Right;    Family History  Problem Relation Age of Onset  . Coronary artery disease Father        cabg  . Cancer Mother        Breast and or lung   . Depression Mother   . Hyperlipidemia Other   . Hypertension Other   . Depression Paternal Grandfather   . Colon cancer Neg Hx     No Known Allergies  Current Outpatient  Medications on File Prior to Visit  Medication Sig Dispense Refill  . acetaminophen (TYLENOL) 325 MG tablet Take 650 mg by mouth every 6 (six) hours as needed for moderate pain.    Marland Kitchen aspirin 81 MG tablet Take 81 mg by mouth daily.      Marland Kitchen atorvastatin (LIPITOR) 80 MG tablet TAKE 1 TABLET (80 MG TOTAL) BY MOUTH DAILY. 90 tablet 2  . buPROPion (WELLBUTRIN XL) 150 MG 24 hr tablet Take 150 mg by mouth daily.    . clopidogrel (PLAVIX) 75 MG tablet Take 1 tablet (75 mg total) by mouth daily. 90 tablet 2  . cyclobenzaprine (FLEXERIL) 10 MG tablet TAKE ONE TABLET BY MOUTH NIGHTLY AT BEDTIME AS NEEDED FOR MUSCLE SPASM 90 tablet 0  . lisinopril-hydrochlorothiazide (PRINZIDE,ZESTORETIC) 20-12.5 MG tablet Take 1 tablet by mouth daily. 90 tablet 2  . metoprolol tartrate (LOPRESSOR) 25 MG tablet Take 1 tablet (25 mg total) by mouth 2 (two) times daily. 180 tablet 2  . Multiple Vitamin (MULTIVITAMIN) capsule Take 1 capsule by mouth daily.      . naproxen sodium (ALEVE) 220 MG tablet Take 220 mg by mouth daily as needed.    . nitroGLYCERIN (NITROSTAT) 0.4 MG SL tablet Place 1 tablet (0.4 mg total) under the tongue every 5 (five) minutes as needed. 25 tablet 1  . sildenafil (VIAGRA) 100 MG tablet Take 100 mg by mouth daily as needed for erectile dysfunction.     No current facility-administered medications on file prior to visit.     BP 128/80   Temp 98.4 F (36.9 C)   Wt 242 lb (109.8 kg)   BMI 35.22 kg/m       Objective:   Physical Exam  Constitutional: He is oriented to person, place, and time. He appears well-developed and well-nourished. No distress.  HENT:  Head: Normocephalic and atraumatic.  Nose: No mucosal edema or rhinorrhea. Right sinus exhibits no maxillary sinus tenderness and no frontal sinus tenderness. Left sinus exhibits no maxillary sinus tenderness and no frontal sinus tenderness.  Mouth/Throat: Uvula is midline and mucous membranes are normal. Tonsils are 0 on the right. Tonsils  are 0 on the left.  + clear PND   Eyes: Pupils are equal, round, and reactive to light. Conjunctivae and EOM are normal. Right eye exhibits no discharge. Left eye exhibits no discharge. No scleral icterus.  Neck: Normal range of motion. Neck supple. No JVD present. No tracheal deviation present. No thyromegaly present.  Cardiovascular: Normal rate, regular rhythm,  normal heart sounds and intact distal pulses.  Pulmonary/Chest: Effort normal and breath sounds normal.  Lymphadenopathy:    He has no cervical adenopathy.  Neurological: He is alert and oriented to person, place, and time.  Skin: Skin is warm and dry. He is not diaphoretic.  Psychiatric: He has a normal mood and affect. His behavior is normal. Judgment and thought content normal.  Nursing note and vitals reviewed.     Assessment & Plan:  1. Cough - Likely from PND.  - Lungs sounds clear throughout. No concern for pneumonia.  - Add Mucinex  2. Post-nasal drip  - azelastine (ASTELIN) 0.1 % nasal spray; Place 2 sprays into both nostrils 2 (two) times daily. Use in each nostril as directed  Dispense: 30 mL; Refill: 1   Dorothyann Peng, NP

## 2018-09-29 MED ORDER — AZELASTINE HCL 0.1 % NA SOLN: 2.0000 | Freq: Two times a day (BID) | NASAL | 1 refills | Status: DC

## 2018-09-29 MED ORDER — AZELASTINE HCL 0.1 % NA SOLN
2.0000 | Freq: Two times a day (BID) | NASAL | 1 refills | Status: DC
Start: 2018-09-29 — End: 2019-01-24

## 2018-09-29 NOTE — Addendum Note (Signed)
Addended by: Miles Costain T on: 09/29/2018 02:04 PM   Modules accepted: Orders

## 2018-10-03 ENCOUNTER — Other Ambulatory Visit: Payer: Self-pay | Admitting: Adult Health

## 2018-10-03 DIAGNOSIS — I10 Essential (primary) hypertension: Secondary | ICD-10-CM

## 2018-10-03 DIAGNOSIS — E7800 Pure hypercholesterolemia, unspecified: Secondary | ICD-10-CM

## 2018-10-04 NOTE — Telephone Encounter (Signed)
Sent to the pharmacy by e-scribe for 90 days.  Pt is scheduled for cpx on 10/10/18.

## 2018-10-10 ENCOUNTER — Encounter: Payer: Self-pay | Admitting: Adult Health

## 2018-10-10 ENCOUNTER — Ambulatory Visit (INDEPENDENT_AMBULATORY_CARE_PROVIDER_SITE_OTHER): Payer: Medicare HMO | Admitting: Adult Health

## 2018-10-10 VITALS — BP 138/70 | Temp 98.5°F | Ht 69.5 in | Wt 244.0 lb

## 2018-10-10 DIAGNOSIS — I2510 Atherosclerotic heart disease of native coronary artery without angina pectoris: Secondary | ICD-10-CM | POA: Diagnosis not present

## 2018-10-10 DIAGNOSIS — E7800 Pure hypercholesterolemia, unspecified: Secondary | ICD-10-CM

## 2018-10-10 DIAGNOSIS — R69 Illness, unspecified: Secondary | ICD-10-CM | POA: Diagnosis not present

## 2018-10-10 DIAGNOSIS — I10 Essential (primary) hypertension: Secondary | ICD-10-CM

## 2018-10-10 DIAGNOSIS — Z125 Encounter for screening for malignant neoplasm of prostate: Secondary | ICD-10-CM | POA: Diagnosis not present

## 2018-10-10 DIAGNOSIS — Z0000 Encounter for general adult medical examination without abnormal findings: Secondary | ICD-10-CM

## 2018-10-10 DIAGNOSIS — F5101 Primary insomnia: Secondary | ICD-10-CM

## 2018-10-10 LAB — HEPATIC FUNCTION PANEL
ALT: 25 U/L (ref 0–53)
AST: 21 U/L (ref 0–37)
Albumin: 4.2 g/dL (ref 3.5–5.2)
Alkaline Phosphatase: 90 U/L (ref 39–117)
BILIRUBIN DIRECT: 0.2 mg/dL (ref 0.0–0.3)
BILIRUBIN TOTAL: 1.2 mg/dL (ref 0.2–1.2)
Total Protein: 6.5 g/dL (ref 6.0–8.3)

## 2018-10-10 LAB — BASIC METABOLIC PANEL
BUN: 19 mg/dL (ref 6–23)
CHLORIDE: 102 meq/L (ref 96–112)
CO2: 26 mEq/L (ref 19–32)
Calcium: 9.7 mg/dL (ref 8.4–10.5)
Creatinine, Ser: 1.21 mg/dL (ref 0.40–1.50)
GFR: 62.77 mL/min (ref >60.00)
Glucose, Bld: 96 mg/dL (ref 70–99)
POTASSIUM: 4 meq/L (ref 3.5–5.1)
SODIUM: 138 meq/L (ref 135–145)

## 2018-10-10 LAB — CBC WITH DIFFERENTIAL/PLATELET
BASOS PCT: 1.2 % (ref 0.0–3.0)
Basophils Absolute: 0.1 10*3/uL (ref 0.0–0.1)
EOS PCT: 3.4 % (ref 0.0–5.0)
Eosinophils Absolute: 0.3 10*3/uL (ref 0.0–0.7)
HCT: 47.4 % (ref 39.0–52.0)
HEMOGLOBIN: 16.3 g/dL (ref 13.0–17.0)
LYMPHS PCT: 25.8 % (ref 12.0–46.0)
Lymphs Abs: 2.1 10*3/uL (ref 0.7–4.0)
MCHC: 34.4 g/dL (ref 30.0–36.0)
MCV: 91.9 fl (ref 78.0–100.0)
MONO ABS: 0.8 10*3/uL (ref 0.1–1.0)
Monocytes Relative: 9.8 % (ref 3.0–12.0)
Neutro Abs: 4.8 10*3/uL (ref 1.4–7.7)
Neutrophils Relative %: 59.8 % (ref 43.0–77.0)
Platelets: 188 10*3/uL (ref 150.0–400.0)
RBC: 5.15 Mil/uL (ref 4.22–5.81)
RDW: 14.4 % (ref 11.5–15.5)
WBC: 8.1 10*3/uL (ref 4.0–10.5)

## 2018-10-10 LAB — LIPID PANEL
CHOLESTEROL: 128 mg/dL (ref 0–200)
HDL: 30.7 mg/dL — ABNORMAL LOW (ref >39.00)
LDL Cholesterol: 67 mg/dL (ref 0–99)
NONHDL: 97.3
Total CHOL/HDL Ratio: 4
Triglycerides: 151 mg/dL — ABNORMAL HIGH (ref 0.0–149.0)
VLDL: 30.2 mg/dL (ref 0.0–40.0)

## 2018-10-10 LAB — TSH: TSH: 2.35 u[IU]/mL (ref 0.35–4.50)

## 2018-10-10 LAB — PSA: PSA: 1.51 ng/mL (ref 0.10–4.00)

## 2018-10-10 NOTE — Progress Notes (Signed)
Subjective:    Patient ID: Dave Robles, male    DOB: [birth year], 72 y.o.   MRN: [id number]  HPI Patient presents for yearly preventative medicine examination. He is a pleasant 72 year old male who  has a past medical history of AAA (abdominal aortic aneurysm) ([place]), Adjustment disorder with depressed mood (03/04/2009), Arthritis, Colon polyps, CORONARY ARTERY DISEASE (07/10/2007), Depression, ERECTILE DYSFUNCTION (07/10/2007), Extrinsic asthma, unspecified (09/10/2008), HYPERLIPIDEMIA (07/10/2007), HYPERTENSION (07/10/2007), Myocardial infarction ([place]) (2016), and TOBACCO USE (12/15/2009).  Hyperlipidemia/CAD - he takes lipitor 80 mg. ASA 81 mg, and Plavix. S/p CABG in 1991  Lab Results  Component Value Date   CHOL 129 10/04/2017   HDL 33.40 (L) 10/04/2017   LDLCALC 59 10/04/2017   LDLDIRECT 66.7 11/30/2011   TRIG 185.0 (H) 10/04/2017   CHOLHDL 4 10/04/2017   Essential Hypertension - Takes lisinopril/HCTZ 20-12.5 mg and Metoprolol 25 mg BID  BP Readings from Last 3 Encounters:  10/10/18 138/70  09/28/18 128/80  09/15/18 122/70   Depression - well controlled with Wellbutrin 150 mg BID  Insomnia - takes Flexeril QHS to help him sleep. He would like to stop taking this medication and try something new.   All immunizations and health maintenance protocols were reviewed with the patient and needed orders were placed. utd   Appropriate screening laboratory values were ordered for the patient including screening of hyperlipidemia, renal function and hepatic function. If indicated by BPH, a PSA was ordered.  Medication reconciliation,  past medical history, social history, problem list and allergies were reviewed in detail with the patient  Goals were established with regard to weight loss, exercise, and  diet in compliance with medications Wt Readings from Last 3 Encounters:  10/10/18 244 lb (110.7 kg)  09/28/18 242 lb (109.8 kg)  09/15/18 245 lb (111.1 kg)   End of life planning  was discussed.  He is utd on routine screening such as colonoscopy, dental and vision exams.    Review of Systems  Constitutional: Negative.   HENT: Negative.   Eyes: Negative.   Respiratory: Negative.   Cardiovascular: Negative.   Gastrointestinal: Negative.   Endocrine: Negative.   Genitourinary: Negative.   Musculoskeletal: Positive for arthralgias.  Skin: Negative.   Allergic/Immunologic: Negative.   Neurological: Negative.   Hematological: Negative.   Psychiatric/Behavioral: Positive for sleep disturbance.  All other systems reviewed and are negative.  Past Medical History:  Diagnosis Date  . AAA (abdominal aortic aneurysm) ([place])   . Adjustment disorder with depressed mood 03/04/2009  . Arthritis   . Colon polyps   . CORONARY ARTERY DISEASE 07/10/2007  . Depression   . ERECTILE DYSFUNCTION 07/10/2007  . Extrinsic asthma, unspecified 09/10/2008  . HYPERLIPIDEMIA 07/10/2007  . HYPERTENSION 07/10/2007  . Myocardial infarction [facility]) 2016   when had angioplasty- was told had a silent MI  . TOBACCO USE 12/15/2009    Social History   Socioeconomic History  . Marital status: Married    Spouse name: Not on file  . Number of children: Not on file  . Years of education: Not on file  . Highest education level: Not on file  Occupational History  . Not on file  Social Needs  . Financial resource strain: Not on file  . Food insecurity:    Worry: Not on file    Inability: Not on file  . Transportation needs:    Medical: Not on file    Non-medical: Not on file  Tobacco Use  . Smoking status: Former  Smoker    Packs/day: 0.50    Last attempt to quit: 01/13/2010    Years since quitting: 8.7  . Smokeless tobacco: Never Used  Substance and Sexual Activity  . Alcohol use: Yes    Alcohol/week: 5.0 standard drinks    Types: 5 Cans of beer per week  . Drug use: No  . Sexual activity: Yes    Comment: regular exercise - yes  Lifestyle  . Physical activity:    Days per week:  Not on file    Minutes per session: Not on file  . Stress: Not on file  Relationships  . Social connections:    Talks on phone: Not on file    Gets together: Not on file    Attends religious service: Not on file    Active member of club or organization: Not on file    Attends meetings of clubs or organizations: Not on file    Relationship status: Not on file  . Intimate partner violence:    Fear of current or ex partner: Not on file    Emotionally abused: Not on file    Physically abused: Not on file    Forced sexual activity: Not on file  Other Topics Concern  . Not on file  Social History Narrative   Retired from being an Financial controller of a small business    Married    No kids       He likes to play golf and rides bike        Past Surgical History:  Procedure Laterality Date  . COLONOSCOPY    . CORONARY ANGIOPLASTY  12/2014  . CORONARY ARTERY BYPASS GRAFT  11/15/89   5 vessels  . HYDROCELE EXCISION / REPAIR  2011  . INGUINAL HERNIA REPAIR Left 1985  . TOTAL HIP ARTHROPLASTY Right 04/19/2018   Procedure: RIGHT TOTAL HIP ARTHROPLASTY ANTERIOR APPROACH;  Surgeon: Gaynelle Arabian, MD;  Location: [facility];  Service: Orthopedics;  Laterality: Right;    Family History  Problem Relation Age of Onset  . Coronary artery disease Father        cabg  . Cancer Mother        Breast and or lung   . Depression Mother   . Hyperlipidemia Other   . Hypertension Other   . Depression Paternal Grandfather   . Colon cancer Neg Hx     No Known Allergies  Current Outpatient Medications on File Prior to Visit  Medication Sig Dispense Refill  . acetaminophen (TYLENOL) 325 MG tablet Take 650 mg by mouth every 6 (six) hours as needed for moderate pain.    Marland Kitchen aspirin 81 MG tablet Take 81 mg by mouth daily.      Marland Kitchen atorvastatin (LIPITOR) 80 MG tablet TAKE 1 TABLET DAILY 90 tablet 0  . azelastine (ASTELIN) 0.1 % nasal spray Place 2 sprays into both nostrils 2 (two) times daily. Use in each nostril as  directed 30 mL 1  . buPROPion (WELLBUTRIN XL) 150 MG 24 hr tablet Take 150 mg by mouth daily.    . clopidogrel (PLAVIX) 75 MG tablet TAKE 1 TABLET DAILY 90 tablet 0  . lisinopril-hydrochlorothiazide (PRINZIDE,ZESTORETIC) 20-12.5 MG tablet TAKE 1 TABLET DAILY 90 tablet 0  . metoprolol tartrate (LOPRESSOR) 25 MG tablet TAKE 1 TABLET TWICE A DAY 180 tablet 0  . Multiple Vitamin (MULTIVITAMIN) capsule Take 1 capsule by mouth daily.      . naproxen sodium (ALEVE) 220 MG tablet Take 220 mg by  mouth daily as needed.    . nitroGLYCERIN (NITROSTAT) 0.4 MG SL tablet Place 1 tablet (0.4 mg total) under the tongue every 5 (five) minutes as needed. 25 tablet 1  . sildenafil (VIAGRA) 100 MG tablet Take 100 mg by mouth daily as needed for erectile dysfunction.     No current facility-administered medications on file prior to visit.     BP 138/70   Temp 98.5 F (36.9 C)   Ht 5' 9.5" (1.765 m)   Wt 244 lb (110.7 kg)   BMI 35.52 kg/m       Objective:   Physical Exam  Constitutional: He is oriented to person, place, and time. He appears well-developed and well-nourished. No distress.  Obese   HENT:  Head: Normocephalic and atraumatic.  Right Ear: Hearing, tympanic membrane, external ear and ear canal normal.  Left Ear: Hearing, tympanic membrane, external ear and ear canal normal.  Nose: Nose normal.  Mouth/Throat: Uvula is midline, oropharynx is clear and moist and mucous membranes are normal. No oropharyngeal exudate.  Eyes: Pupils are equal, round, and reactive to light. Conjunctivae and EOM are normal. Right eye exhibits no discharge. Left eye exhibits no discharge. No scleral icterus.  Neck: Normal range of motion. Neck supple. No JVD present. No tracheal deviation present. No thyromegaly present.  Cardiovascular: Normal rate, regular rhythm, normal heart sounds and intact distal pulses. Exam reveals no gallop and no friction rub.  No murmur heard. Pulmonary/Chest: Effort normal and breath  sounds normal. No stridor. No respiratory distress. He has no wheezes. He has no rales. He exhibits no tenderness.  Abdominal: Soft. Bowel sounds are normal. He exhibits no distension and no mass. There is no tenderness. There is no rebound and no guarding. No hernia.  Musculoskeletal: Normal range of motion. He exhibits no edema, tenderness or deformity.  Lymphadenopathy:    He has no cervical adenopathy.  Neurological: He is alert and oriented to person, place, and time. He displays normal reflexes. No cranial nerve deficit or sensory deficit. He exhibits normal muscle tone. Coordination normal.  Skin: Skin is warm and dry. Capillary refill takes less than 2 seconds. No rash noted. He is not diaphoretic. No erythema. No pallor.  Psychiatric: He has a normal mood and affect. His behavior is normal. Judgment and thought content normal.  Nursing note and vitals reviewed.     Assessment & Plan:  1. Routine general medical examination at a health care facility - Encouraged weight loss through diet and exercise  - Follow up in one year or sooner if needed  - Basic metabolic panel - CBC with Differential/Platelet - Hepatic function panel - Lipid panel - TSH  2. Atherosclerosis of native coronary artery of native heart without angina pectoris - Continue with Plavix, ASA, and Lipitor    3. Essential hypertension - Near goal.  - No change in medication  - Basic metabolic panel - CBC with Differential/Platelet - Hepatic function panel - Lipid panel - TSH  4. Pure hypercholesterolemia - Continue with Lipitor and ASA - Basic metabolic panel - CBC with Differential/Platelet - Hepatic function panel - Lipid panel - TSH  5. Prostate cancer screening  - PSA  6. Primary insomnia - D/c Flexeril  - Try Melatonin 3-5mg  - Can consider Trazodone in the future   Dorothyann Peng, NP

## 2018-10-16 DIAGNOSIS — L821 Other seborrheic keratosis: Secondary | ICD-10-CM | POA: Diagnosis not present

## 2018-10-16 DIAGNOSIS — L719 Rosacea, unspecified: Secondary | ICD-10-CM | POA: Diagnosis not present

## 2018-10-16 DIAGNOSIS — M1712 Unilateral primary osteoarthritis, left knee: Secondary | ICD-10-CM | POA: Diagnosis not present

## 2018-10-16 DIAGNOSIS — Z23 Encounter for immunization: Secondary | ICD-10-CM | POA: Diagnosis not present

## 2018-10-16 DIAGNOSIS — L570 Actinic keratosis: Secondary | ICD-10-CM | POA: Diagnosis not present

## 2018-10-16 DIAGNOSIS — Z86018 Personal history of other benign neoplasm: Secondary | ICD-10-CM | POA: Diagnosis not present

## 2018-10-17 ENCOUNTER — Encounter: Payer: Self-pay | Admitting: Adult Health

## 2018-11-01 ENCOUNTER — Ambulatory Visit (HOSPITAL_COMMUNITY)
Admission: RE | Admit: 2018-11-01 | Discharge: 2018-11-01 | Disposition: A | Discharge status: Home / Self Care | Payer: Medicare HMO | Source: Ambulatory Visit | Attending: Cardiovascular Disease | Admitting: Cardiovascular Disease

## 2018-11-01 DIAGNOSIS — I714 Abdominal aortic aneurysm, without rupture, unspecified: Secondary | ICD-10-CM

## 2018-11-17 DIAGNOSIS — I252 Old myocardial infarction: Secondary | ICD-10-CM | POA: Diagnosis not present

## 2018-11-17 DIAGNOSIS — I10 Essential (primary) hypertension: Secondary | ICD-10-CM | POA: Diagnosis not present

## 2018-11-17 DIAGNOSIS — I714 Abdominal aortic aneurysm, without rupture: Secondary | ICD-10-CM | POA: Diagnosis not present

## 2018-11-17 DIAGNOSIS — E7800 Pure hypercholesterolemia, unspecified: Secondary | ICD-10-CM | POA: Diagnosis not present

## 2018-11-17 DIAGNOSIS — N528 Other male erectile dysfunction: Secondary | ICD-10-CM | POA: Diagnosis not present

## 2018-11-17 DIAGNOSIS — R69 Illness, unspecified: Secondary | ICD-10-CM | POA: Diagnosis not present

## 2018-11-17 DIAGNOSIS — Z96641 Presence of right artificial hip joint: Secondary | ICD-10-CM | POA: Diagnosis not present

## 2018-11-17 DIAGNOSIS — I2510 Atherosclerotic heart disease of native coronary artery without angina pectoris: Secondary | ICD-10-CM | POA: Diagnosis not present

## 2018-11-17 DIAGNOSIS — Z87891 Personal history of nicotine dependence: Secondary | ICD-10-CM | POA: Diagnosis not present

## 2018-11-17 DIAGNOSIS — L719 Rosacea, unspecified: Secondary | ICD-10-CM | POA: Diagnosis not present

## 2018-12-18 DIAGNOSIS — L570 Actinic keratosis: Secondary | ICD-10-CM | POA: Diagnosis not present

## 2018-12-18 DIAGNOSIS — L218 Other seborrheic dermatitis: Secondary | ICD-10-CM | POA: Diagnosis not present

## 2018-12-18 DIAGNOSIS — L718 Other rosacea: Secondary | ICD-10-CM | POA: Diagnosis not present

## 2018-12-22 DIAGNOSIS — I2510 Atherosclerotic heart disease of native coronary artery without angina pectoris: Secondary | ICD-10-CM | POA: Diagnosis not present

## 2018-12-22 DIAGNOSIS — I70291 Other atherosclerosis of native arteries of extremities, right leg: Secondary | ICD-10-CM | POA: Diagnosis not present

## 2018-12-22 DIAGNOSIS — I119 Hypertensive heart disease without heart failure: Secondary | ICD-10-CM | POA: Diagnosis not present

## 2018-12-22 DIAGNOSIS — E782 Mixed hyperlipidemia: Secondary | ICD-10-CM | POA: Diagnosis not present

## 2018-12-22 DIAGNOSIS — I714 Abdominal aortic aneurysm, without rupture: Secondary | ICD-10-CM | POA: Diagnosis not present

## 2018-12-22 DIAGNOSIS — I252 Old myocardial infarction: Secondary | ICD-10-CM | POA: Diagnosis not present

## 2019-01-05 ENCOUNTER — Encounter: Payer: Self-pay | Admitting: Nurse Practitioner

## 2019-01-24 ENCOUNTER — Ambulatory Visit: Payer: Medicare HMO | Admitting: Nurse Practitioner

## 2019-01-24 ENCOUNTER — Encounter: Payer: Self-pay | Admitting: Nurse Practitioner

## 2019-01-24 ENCOUNTER — Other Ambulatory Visit: Payer: Self-pay

## 2019-01-24 VITALS — BP 150/102 | HR 58 | Ht 70.0 in | Wt 246.1 lb

## 2019-01-24 DIAGNOSIS — I259 Chronic ischemic heart disease, unspecified: Secondary | ICD-10-CM | POA: Diagnosis not present

## 2019-01-24 DIAGNOSIS — I2510 Atherosclerotic heart disease of native coronary artery without angina pectoris: Secondary | ICD-10-CM | POA: Diagnosis not present

## 2019-01-24 NOTE — Progress Notes (Addendum)
CARDIOLOGY OFFICE NOTE  Date:  01/24/2019    Dave Robles Date of Birth: 1946-03-23 Medical Record #[id number]  PCP:  Dorothyann Peng, NP  Cardiologist:  Servando Snare  Chief Complaint  Patient presents with  . Coronary Artery Disease    Follow up visit    History of Present Illness: Dave Robles is a 73 y.o. male who presents today for a follow up visit. This is a 6 month check.  Former patient of Dr. Claris Gladden - typically follows with me.  He has HTN, HLD, CAD withremoteCABG in 1991, negative Myoview in 2011 with EF 67% and no ischemia, ED, tobacco abuse, asthma, AAA (last measured in 10/2018) and depression.  Seen in April of 2014 - he had had a brief syncopal spell six weeks prior to that visit - Myoview was updated - this looked good.  Seen back in March of 2016 after being out in [place] for a long vacation the month prior. Had been exercising pretty hard - riding his bike about 15 miles per day. One day he just did not feel well. No real chest pain but was concerned and went to the ER. Told them he had CAD. EKG was ok. Troponin was +. He was cathed - noted to have NL left main, 90% prox LAD, 99% mid LAD with patent LIMA to mid LAD and patent SVG to DX. LCX origin stenosis vs flow void? Fistula vs competitive flow in branch?, 100% RCA, SVG to RCA 99% origin treated with Xience DES and PTCA to the distal.He is on Plavix. He was told that "there was something squirting" and that he possibly needs a CT scan???? Placed on Metoprolol.   [place] with Dr. 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 Burt Knack did NOT feel any treatment is necessary - this is oxygenated blood going to oxygenated blood.  I increased his beta blocker and advised that we needed to up titrate his medicines due to his LV dysfunction. His EF was 45 to 50%.   Seen last March of 2019 - he had been to [place] for  2 months.  No actual chest pain - but he has never had a chest pain syndrome - had more of a "harder to work" with his CABG and with his last cardiac event a few days years ago - noted "just did not feel right". He did note some atypical tingling in the left shoulder - needing pre op clearance for right hip surgery - we ended up getting his Myoview updated. This turned out ok. Last visit with me was back in September - he was doing well from our standpoint.   Comes in today. [place].He comes wearing a mask - traveled to [place] 2 weeks ago - he is not sick.  BP up here today. Slept poorly last night. Recheck by me is fine. No chest pain. Not short of breath. Riding his bike. Enjoys his time in [place] - has gotten a cardiologist there - would like to sign a release to share records. He has no real complaint. BP is fine at home. Feels good on his medicines.   Past Medical History:  Diagnosis Date  . AAA (abdominal aortic aneurysm) ([place])   . Adjustment disorder with depressed mood 03/04/2009  . Arthritis   . Colon polyps   . CORONARY ARTERY DISEASE 07/10/2007  . Depression   . ERECTILE DYSFUNCTION 07/10/2007  .  Extrinsic asthma, unspecified 09/10/2008  . HYPERLIPIDEMIA 07/10/2007  . HYPERTENSION 07/10/2007  . Myocardial infarction [facility]) 2016   when had angioplasty- was told had a silent MI  . TOBACCO USE 12/15/2009    Past Surgical History:  Procedure Laterality Date  . COLONOSCOPY    . CORONARY ANGIOPLASTY  12/2014  . CORONARY ARTERY BYPASS GRAFT  11/15/89   5 vessels  . HYDROCELE EXCISION / REPAIR  2011  . INGUINAL HERNIA REPAIR Left 1985  . TOTAL HIP ARTHROPLASTY Right 04/19/2018   Procedure: RIGHT TOTAL HIP ARTHROPLASTY ANTERIOR APPROACH;  Surgeon: Gaynelle Arabian, MD;  Location: [facility];  Service: Orthopedics;  Laterality: Right;     Medications: Current Meds  Medication Sig  . acetaminophen (TYLENOL) 325 MG tablet Take 650 mg by mouth every 6 (six) hours as needed for moderate pain.   Marland Kitchen aspirin 81 MG tablet Take 81 mg by mouth daily.    Marland Kitchen atorvastatin (LIPITOR) 80 MG tablet TAKE 1 TABLET DAILY  . buPROPion (WELLBUTRIN XL) 150 MG 24 hr tablet Take 150 mg by mouth daily.  . clopidogrel (PLAVIX) 75 MG tablet TAKE 1 TABLET DAILY  . lisinopril-hydrochlorothiazide (PRINZIDE,ZESTORETIC) 20-12.5 MG tablet TAKE 1 TABLET DAILY  . metoprolol tartrate (LOPRESSOR) 25 MG tablet TAKE 1 TABLET TWICE A DAY  . Multiple Vitamin (MULTIVITAMIN) capsule Take 1 capsule by mouth daily.    . naproxen sodium (ALEVE) 220 MG tablet Take 220 mg by mouth daily as needed.  . nitroGLYCERIN (NITROSTAT) 0.4 MG SL tablet Place 1 tablet (0.4 mg total) under the tongue every 5 (five) minutes as needed.  . sildenafil (VIAGRA) 100 MG tablet Take 100 mg by mouth daily as needed for erectile dysfunction.     Allergies: No Known Allergies  Social History: The patient  reports that he quit smoking about 9 years ago. He smoked 0.50 packs per day. He has never used smokeless tobacco. He reports current alcohol use of about 5.0 standard drinks of alcohol per week. He reports that he does not use drugs.   Family History: The patient's family history includes Cancer in his mother; Coronary artery disease in his father; Depression in his mother and paternal grandfather; Hyperlipidemia in an other family member; Hypertension in an other family member.   Review of Systems: Please see the history of present illness.   Otherwise, the review of systems is positive for none.   All other systems are reviewed and negative.   Physical Exam: VS:  BP (!) 150/102 (BP Location: Left Arm, Patient Position: Sitting, Cuff Size: Normal)   Pulse (!) 58   Ht 5\' 10"  (1.778 m)   Wt 246 lb 1.9 oz (111.6 kg)   BMI 35.31 kg/m  .  BMI Body mass index is 35.31 kg/m.  Wt Readings from Last 3 Encounters:  01/24/19 246 lb 1.9 oz (111.6 kg)  10/10/18 244 lb (110.7 kg)  09/28/18 242 lb (109.8 kg)    General: Pleasant. Obese. Alert  and in no acute distress.  Weight up one pound since my last visit.  HEENT: Normal.  Neck: Supple, no JVD, carotid bruits, or masses noted.  Cardiac: Regular rate and rhythm. No murmurs, rubs, or gallops. No edema.  Respiratory:  Lungs are clear to auscultation bilaterally with normal work of breathing.  GI: Soft and nontender.  MS: No deformity or atrophy. Gait and ROM intact.  Skin: Warm and dry. Color is normal.  Neuro:  Strength and sensation are intact and no gross focal deficits  noted.  Psych: Alert, appropriate and with normal affect.   LABORATORY DATA:  EKG:  EKG is ordered today. This demonstrates sinus brady - PVC - HR is 58.   Lab Results  Component Value Date   WBC 8.1 10/10/2018   HGB 16.3 10/10/2018   HCT 47.4 10/10/2018   PLT 188.0 10/10/2018   GLUCOSE 96 10/10/2018   CHOL 128 10/10/2018   TRIG 151.0 (H) 10/10/2018   HDL 30.70 (L) 10/10/2018   LDLDIRECT 66.7 11/30/2011   LDLCALC 67 10/10/2018   ALT 25 10/10/2018   AST 21 10/10/2018   NA 138 10/10/2018   K 4.0 10/10/2018   CL 102 10/10/2018   CREATININE 1.21 10/10/2018   BUN 19 10/10/2018   CO2 26 10/10/2018   TSH 2.35 10/10/2018   PSA 1.51 10/10/2018   INR 1.06 04/11/2018     BNP (last 3 results) No results for input(s): BNP in the last 8760 hours.  ProBNP (last 3 results) No results for input(s): PROBNP in the last 8760 hours.   Other Studies Reviewed Today:  Myoview Study Highlights 02/2018    Nuclear stress EF: 54%.  There is a small defect of mild severity present in the basal inferior location. The defect is non-reversible and consistent with diaphragmatic attenuation artifact. No ischemia noted.  The left ventricular ejection fraction is mildly decreased (45-54%).  Compared to study of 2014, fixed basal inferior defect is new.  There was no ST segment deviation noted during stress.  This is a low risk study.    Echo Study Conclusions from 03/2015  - Left ventricle: The cavity  size was normal. Systolic function was normal. The estimated ejection fraction was in the range of 55% to 60%. Wall motion was normal; there were no regional wall motion abnormalities. Left ventricular diastolic function parameters were normal. - Left atrium: The atrium was mildly dilated. - Atrial septum: No defect or patent foramen ovale was identified  Assessment/Plan:  1. CAD/prior NSTEMI - with PCI to SVG to RCA in March of 2016- EF 45%. Cath film wasreviewed with Dr. Burt Knack previously. No further intervention felt to be needed andit was advised tocontinue with medical management- he has been continued on DAPT.  He has never had chest pain syndrome. He has had a stable Myoview from 2019. He is doing well clinically. No changes made today.   2.Mild LV dysfunction - with resolution per echo from May of 2016.He remains without symptoms. No changes today.   3. HLD - on statin -labs from this past November noted. No changes made.   4. HTN - BP recheck by me is fine - 134/74 by me - no changes made today.   5. Obesity - encouraged continued activity and weight loss.   6. AAA -he had his study in December of 2019 - this was stable - measures 3.4 -  Would repeat in one year.    Current medicines are reviewed with the patient today.  The patient does not have concerns regarding medicines other than what has been noted above.  The following changes have been made:  See above.  Labs/ tests ordered today include:    Orders Placed This Encounter  Procedures  . EKG 12-Lead     Disposition:   FU with me in 6 months.   Patient is agreeable to this plan and will call if any problems develop in the interim.   SignedTruitt Merle, NP  01/24/2019 9:36 AM  [facility]  [address] [place], [place]  [address] Phone: [phone] Fax: [phone]

## 2019-01-24 NOTE — Patient Instructions (Addendum)
We will be checking the following labs today - NONE    Medication Instructions:    Continue with your current medicines.    If you need a refill on your cardiac medications before your next appointment, please call your pharmacy.     Testing/Procedures To Be Arranged:  N/A  Follow-Up:   See me in September - we will arrange your duplex study for your AAA for December at that visit.     At [facility], you and your health needs are our priority.  As part of our continuing mission to provide you with exceptional heart care, we have created designated Provider Care Teams.  These Care Teams include your primary Cardiologist (physician) and Advanced Practice Providers (APPs -  Physician Assistants and Nurse Practitioners) who all work together to provide you with the care you need, when you need it.  Special Instructions:  . None  Call the [place] office at [phone] if you have any questions, problems or concerns.

## 2019-01-26 DIAGNOSIS — Z86018 Personal history of other benign neoplasm: Secondary | ICD-10-CM | POA: Diagnosis not present

## 2019-01-26 DIAGNOSIS — L821 Other seborrheic keratosis: Secondary | ICD-10-CM | POA: Diagnosis not present

## 2019-01-26 DIAGNOSIS — L570 Actinic keratosis: Secondary | ICD-10-CM | POA: Diagnosis not present

## 2019-01-26 DIAGNOSIS — Z808 Family history of malignant neoplasm of other organs or systems: Secondary | ICD-10-CM | POA: Diagnosis not present

## 2019-01-26 DIAGNOSIS — Z23 Encounter for immunization: Secondary | ICD-10-CM | POA: Diagnosis not present

## 2019-01-26 DIAGNOSIS — D1722 Benign lipomatous neoplasm of skin and subcutaneous tissue of left arm: Secondary | ICD-10-CM | POA: Diagnosis not present

## 2019-01-26 DIAGNOSIS — D2239 Melanocytic nevi of other parts of face: Secondary | ICD-10-CM | POA: Diagnosis not present

## 2019-01-26 DIAGNOSIS — D225 Melanocytic nevi of trunk: Secondary | ICD-10-CM | POA: Diagnosis not present

## 2019-01-26 DIAGNOSIS — D1721 Benign lipomatous neoplasm of skin and subcutaneous tissue of right arm: Secondary | ICD-10-CM | POA: Diagnosis not present

## 2019-01-27 ENCOUNTER — Other Ambulatory Visit: Payer: Self-pay | Admitting: Adult Health

## 2019-01-27 DIAGNOSIS — I10 Essential (primary) hypertension: Secondary | ICD-10-CM

## 2019-01-27 DIAGNOSIS — E7800 Pure hypercholesterolemia, unspecified: Secondary | ICD-10-CM

## 2019-01-29 MED ORDER — ATORVASTATIN CALCIUM 80 MG PO TABS: ORAL_TABLET | ORAL | 2 refills | Status: DC

## 2019-01-29 MED ORDER — METOPROLOL TARTRATE 25 MG PO TABS: 25.0000 mg | ORAL_TABLET | Freq: Two times a day (BID) | ORAL | 2 refills | Status: DC

## 2019-01-29 MED ORDER — CLOPIDOGREL BISULFATE 75 MG PO TABS: 75.0000 mg | ORAL_TABLET | Freq: Every day | ORAL | 2 refills | Status: DC

## 2019-01-29 MED ORDER — LISINOPRIL-HYDROCHLOROTHIAZIDE 20-12.5 MG PO TABS: 1.0000 | ORAL_TABLET | Freq: Every day | ORAL | 2 refills | Status: DC

## 2019-01-29 NOTE — Telephone Encounter (Signed)
Duplicate request

## 2019-01-29 NOTE — Telephone Encounter (Signed)
Sent to the pharmacy by e-scribe. 

## 2019-03-06 ENCOUNTER — Other Ambulatory Visit: Payer: Self-pay

## 2019-03-06 ENCOUNTER — Encounter: Payer: Self-pay | Admitting: Adult Health

## 2019-03-06 ENCOUNTER — Telehealth (INDEPENDENT_AMBULATORY_CARE_PROVIDER_SITE_OTHER): Payer: Medicare HMO | Admitting: Physician Assistant

## 2019-03-06 ENCOUNTER — Telehealth: Payer: Self-pay | Admitting: Nurse Practitioner

## 2019-03-06 ENCOUNTER — Encounter: Payer: Self-pay | Admitting: Physician Assistant

## 2019-03-06 VITALS — BP 101/65 | HR 50 | Ht 70.0 in | Wt 232.0 lb

## 2019-03-06 DIAGNOSIS — R1013 Epigastric pain: Secondary | ICD-10-CM

## 2019-03-06 DIAGNOSIS — I714 Abdominal aortic aneurysm, without rupture, unspecified: Secondary | ICD-10-CM

## 2019-03-06 DIAGNOSIS — I2510 Atherosclerotic heart disease of native coronary artery without angina pectoris: Secondary | ICD-10-CM

## 2019-03-06 DIAGNOSIS — Z7189 Other specified counseling: Secondary | ICD-10-CM

## 2019-03-06 DIAGNOSIS — I10 Essential (primary) hypertension: Secondary | ICD-10-CM

## 2019-03-06 NOTE — Telephone Encounter (Signed)
Virtual Visit Pre-Appointment Phone Call  "(Name), I am calling you today to discuss your upcoming appointment. We are currently trying to limit exposure to the virus that causes COVID-19 by seeing patients at home rather than in the office."  1. "What is the BEST phone number to call the day of the visit?" - include this in appointment notes  2. "Do you have or have access to (through a family member/friend) a smartphone with video capability that we can use for your visit?" a. If yes - list this number in appt notes as "cell" (if different from BEST phone #) and list the appointment type as a VIDEO visit in appointment notes b. If no - list the appointment type as a PHONE visit in appointment notes  3. Confirm consent - "In the setting of the current Covid19 crisis, you are scheduled for a (phone or video) visit with your provider on (Tuesday,  April 21) at (2:45 pm).  Just as we do with many in-office visits, in order for you to participate in this visit, we must obtain consent.  If you'd like, I can send this to your mychart (if signed up) or email for you to review.  Otherwise, I can obtain your verbal consent now.  All virtual visits are billed to your insurance company just like a normal visit would be.  By agreeing to a virtual visit, we'd like you to understand that the technology does not allow for your provider to perform an examination, and thus may limit your provider's ability to fully assess your condition. If your provider identifies any concerns that need to be evaluated in person, we will make arrangements to do so.  Finally, though the technology is pretty good, we cannot assure that it will always work on either your or our end, and in the setting of a video visit, we may have to convert it to a phone-only visit.  In either situation, we cannot ensure that we have a secure connection.  Are you willing to proceed?" STAFF: Did the patient verbally acknowledge consent to telehealth  visit? Document YES/NO here: YES  4. Advise patient to be prepared - "Two hours prior to your appointment, go ahead and check your blood pressure, pulse, oxygen saturation, and your weight (if you have the equipment to check those) and write them all down. When your visit starts, your provider will ask you for this information. If you have an Apple Watch or Kardia device, please plan to have heart rate information ready on the day of your appointment. Please have a pen and paper handy nearby the day of the visit as well."  5. Give patient instructions for MyChart download to smartphone OR Doximity/Doxy.me as below if video visit (depending on what platform provider is using)  6. Inform patient they will receive a phone call 15 minutes prior to their appointment time (may be from unknown caller ID) so they should be prepared to answer    TELEPHONE CALL NOTE  Dave Robles has been deemed a candidate for a follow-up tele-health visit to limit community exposure during the Covid-19 pandemic. I spoke with the patient via phone to ensure availability of phone/video source, confirm preferred email & phone number, and discuss instructions and expectations.  I reminded Dave Robles to be prepared with any vital sign and/or heart rhythm information that could potentially be obtained via home monitoring, at the time of his visit. I reminded Dave Robles to expect a  phone call prior to his visit.  Dave Robles 03/06/2019 11:08 AM   INSTRUCTIONS FOR DOWNLOADING THE MYCHART APP TO SMARTPHONE  - The patient must first make sure to have activated MyChart and know their login information - If Apple, go to [employer] and type in MyChart in the search bar and download the app. If Android, ask patient to go to [employer] and type in [place] in the search bar and download the app. The app is free but as with any other app downloads, their phone may require them to verify saved payment  information or Apple/Android password.  - The patient will need to then log into the app with their MyChart username and password, and select [facility] as their healthcare provider to link the account. When it is time for your visit, go to the MyChart app, find appointments, and click Begin Video Visit. Be sure to Select Allow for your device to access the Microphone and Camera for your visit. You will then be connected, and your provider will be with you shortly.  **If they have any issues connecting, or need assistance please contact MyChart service desk [phone])**  **If using a computer, in order to ensure the best quality for their visit they will need to use either of the following Internet Browsers: [employer], or Google Chrome**  IF USING DOXIMITY or DOXY.ME - The patient will receive a link just prior to their visit by text.     FULL LENGTH CONSENT FOR TELE-HEALTH VISIT   I hereby voluntarily request, consent and authorize [facility] and its employed or contracted physicians, physician assistants, nurse practitioners or other licensed health care professionals (the Practitioner), to provide me with telemedicine health care services (the "Services") as deemed necessary by the treating Practitioner. I acknowledge and consent to receive the Services by the Practitioner via telemedicine. I understand that the telemedicine visit will involve communicating with the Practitioner through live audiovisual communication technology and the disclosure of certain medical information by electronic transmission. I acknowledge that I have been given the opportunity to request an in-person assessment or other available alternative prior to the telemedicine visit and am voluntarily participating in the telemedicine visit.  I understand that I have the right to withhold or withdraw my consent to the use of telemedicine in the course of my care at any time, without affecting my right  to future care or treatment, and that the Practitioner or I may terminate the telemedicine visit at any time. I understand that I have the right to inspect all information obtained and/or recorded in the course of the telemedicine visit and may receive copies of available information for a reasonable fee.  I understand that some of the potential risks of receiving the Services via telemedicine include:  Marland Kitchen Delay or interruption in medical evaluation due to technological equipment failure or disruption; . Information transmitted may not be sufficient (e.g. poor resolution of images) to allow for appropriate medical decision making by the Practitioner; and/or  . In rare instances, security protocols could fail, causing a breach of personal health information.  Furthermore, I acknowledge that it is my responsibility to provide information about my medical history, conditions and care that is complete and accurate to the best of my ability. I acknowledge that Practitioner's advice, recommendations, and/or decision may be based on factors not within their control, such as incomplete or inaccurate data provided by me or distortions of diagnostic images or specimens that may result  from electronic transmissions. I understand that the practice of medicine is not an exact science and that Practitioner makes no warranties or guarantees regarding treatment outcomes. I acknowledge that I will receive a copy of this consent concurrently upon execution via email to the email address I last provided but may also request a printed copy by calling the office of [facility].    I understand that my insurance will be billed for this visit.   I have read or had this consent read to me. . I understand the contents of this consent, which adequately explains the benefits and risks of the Services being provided via telemedicine.  . I have been provided ample opportunity to ask questions regarding this consent and the Services  and have had my questions answered to my satisfaction. . I give my informed consent for the services to be provided through the use of telemedicine in my medical care  By participating in this telemedicine visit I agree to the above.

## 2019-03-06 NOTE — Progress Notes (Signed)
Thank you for seeing him! 

## 2019-03-06 NOTE — Telephone Encounter (Signed)
Advised with Cecille Rubin pt needs a VT today for atypical chest pain and Known CAD.

## 2019-03-06 NOTE — Patient Instructions (Signed)
Medication Instructions:  No changes  If you need a refill on your cardiac medications before your next appointment, please call your pharmacy.   Lab work: None  If you have labs (blood work) drawn today and your tests are completely normal, you will receive your results only by: Marland Kitchen MyChart Message (if you have MyChart) OR . A paper copy in the mail If you have any lab test that is abnormal or we need to change your treatment, we will call you to review the results.  Testing/Procedures: None  Follow-Up: At [facility], you and your health needs are our priority.  As part of our continuing mission to provide you with exceptional heart care, we have created designated Provider Care Teams.  These Care Teams include your primary Cardiologist (physician) and Advanced Practice Providers (APPs -  Physician Assistants and Nurse Practitioners) who all work together to provide you with the care you need, when you need it. [name], NP in 07/2019 as planned  Any Other Special Instructions Will Be Listed Below (If Applicable).  Eat bland foods for the next few days.  Try to avoid greasy or spicy foods. If you develop symptoms of decreased exercise tolerance, chest discomfort or shortness of breath, call our office. Call your primary care provider's office to arrange evaluation of your current symptoms.  Your symptoms may be explained by gallbladder disease or some other gastrointestinal issue.

## 2019-03-06 NOTE — Telephone Encounter (Signed)
New message   Patient states that he had pains in upper abdomen area. He states that the pain was a tight pain. The patient states that he had these pains last night. Please contact the patient.

## 2019-03-06 NOTE — Telephone Encounter (Signed)
S/w pt is aware of VT visit today with Richardson Dopp, PA. Pt is aware of all instructions. Consent in chart.  Can do Doximity/Video.

## 2019-03-06 NOTE — Progress Notes (Signed)
Virtual Visit via Telephone Note   This visit type was conducted due to national recommendations for restrictions regarding the COVID-19 Pandemic (e.g. social distancing) in an effort to limit this patient's exposure and mitigate transmission in our community.  Due to his co-morbid illnesses, this patient is at least at moderate risk for complications without adequate follow up.  This format is felt to be most appropriate for this patient at this time.  The patient did not have access to video technology/had technical difficulties with video requiring transitioning to audio format only (telephone).  All issues noted in this document were discussed and addressed.  No physical exam could be performed with this format.  Please refer to the patient's chart for his  consent to telehealth for [facility].   Evaluation Performed:  Follow-up visit  Date:  03/06/2019   ID:  Dave Robles, DOB January 21, 1946, MRN [id number]  Patient Location: Home Provider Location: Home  PCP:  Dorothyann Peng, NP  Cardiologist:  [name], NP  Electrophysiologist:  None   Chief Complaint:  Abdominal pain  History of Present Illness:    Dave Robles is a 73 y.o. male with coronary artery disease status post CABG in 1991, AAA, hypertension, hyperlipidemia.  He had a non-ST elevation myocardial infarction in 2016 while vacationing in [place].  At that time, he underwent DES to the [place].  Myoview in April 2019 was low risk with EF 54 and no ischemia.  He was last seen by [name], NP in 01/2019.    He called in today with complaints of abdominal pain and was added onto my schedule.  He has been in his usual state of health.  He has not had any exertional chest discomfort or decreased exercise tolerance.  Around 2:30 in the morning, he awoke with epigastric abdominal discomfort.  This lasted about 90 minutes.  He did not take anything to make it go away.  His blood pressure was somewhat elevated.  After  his symptoms subsided, his blood pressure went back to normal.  He did not have any ripping or tearing sensation.  He did not have any chest discomfort or nausea, diaphoresis, vomiting, diarrhea.  He has not had any recent syncope, orthopnea, paroxysmal nocturnal dyspnea or lower extremity swelling.  Currently, he feels normal.  The patient does not have symptoms concerning for COVID-19 infection (fever, chills, cough, or new shortness of breath).    Past Medical History:  Diagnosis Date  . AAA (abdominal aortic aneurysm) ([place])   . Adjustment disorder with depressed mood 03/04/2009  . Arthritis   . Colon polyps   . CORONARY ARTERY DISEASE 07/10/2007  . Depression   . ERECTILE DYSFUNCTION 07/10/2007  . Extrinsic asthma, unspecified 09/10/2008  . HYPERLIPIDEMIA 07/10/2007  . HYPERTENSION 07/10/2007  . Myocardial infarction [facility]) 2016   when had angioplasty- was told had a silent MI  . TOBACCO USE 12/15/2009   Past Surgical History:  Procedure Laterality Date  . COLONOSCOPY    . CORONARY ANGIOPLASTY  12/2014  . CORONARY ARTERY BYPASS GRAFT  11/15/89   5 vessels  . HYDROCELE EXCISION / REPAIR  2011  . INGUINAL HERNIA REPAIR Left 1985  . TOTAL HIP ARTHROPLASTY Right 04/19/2018   Procedure: RIGHT TOTAL HIP ARTHROPLASTY ANTERIOR APPROACH;  Surgeon: Gaynelle Arabian, MD;  Location: [facility];  Service: Orthopedics;  Laterality: Right;     Current Meds  Medication Sig  . acetaminophen (TYLENOL) 325 MG tablet Take 650 mg by mouth every  6 (six) hours as needed for moderate pain.  Marland Kitchen aspirin 81 MG tablet Take 81 mg by mouth daily.    Marland Kitchen atorvastatin (LIPITOR) 80 MG tablet TAKE 1 TABLET DAILY  . buPROPion (WELLBUTRIN XL) 150 MG 24 hr tablet Take 150 mg by mouth daily.  . clopidogrel (PLAVIX) 75 MG tablet Take 1 tablet (75 mg total) by mouth daily.  Marland Kitchen lisinopril-hydrochlorothiazide (PRINZIDE,ZESTORETIC) 20-12.5 MG tablet Take 1 tablet by mouth daily.  . metoprolol tartrate (LOPRESSOR) 25 MG tablet Take 1  tablet (25 mg total) by mouth 2 (two) times daily.  . Multiple Vitamin (MULTIVITAMIN) capsule Take 1 capsule by mouth daily.    . naproxen sodium (ALEVE) 220 MG tablet Take 220 mg by mouth daily as needed.  . nitroGLYCERIN (NITROSTAT) 0.4 MG SL tablet Place 1 tablet (0.4 mg total) under the tongue every 5 (five) minutes as needed.  . sildenafil (VIAGRA) 100 MG tablet Take 100 mg by mouth daily as needed for erectile dysfunction.     Allergies:   Patient has no known allergies.   Social History   Tobacco Use  . Smoking status: Former Smoker    Packs/day: 0.50    Last attempt to quit: 01/13/2010    Years since quitting: 9.1  . Smokeless tobacco: Never Used  Substance Use Topics  . Alcohol use: Yes    Alcohol/week: 5.0 standard drinks    Types: 5 Cans of beer per week  . Drug use: No     Family Hx: The patient's family history includes Cancer in his mother; Coronary artery disease in his father; Depression in his mother and paternal grandfather; Hyperlipidemia in an other family member; Hypertension in an other family member. There is no history of Colon cancer.  ROS:   Please see the history of present illness.    All other systems reviewed and are negative.   Prior CV studies:   The following studies were reviewed today:  AAA [place] 11/01/2018 Summary: Abdominal Aorta: There is evidence of abnormal dilitation of the Proximal Abdominal aorta. The largest aortic measurement is 3.4 cm. The largest aortic diameter remains essentially unchanged compared to prior exam. Previous diameter measurement was 3.6  cm obtained on 12/18. Stenosis: +------------------+-------------+ Location          Stenosis      +------------------+-------------+ Right Common Iliac>50% stenosis +------------------+-------------+   Myoview 02/14/2018 EF 54, diaphragmatic attenuation, no ischemia, low risk study  Echocardiogram 04/07/2015 EF 55-60, normal wall motion, normal diastolic function, mild  LAE  Cardiac catheterization 01/03/2015 [place], [place])   Labs/Other Tests and Data Reviewed:    EKG:  An ECG dated 01/24/2019 was personally reviewed today and demonstrated:  sinus brady, HR 58, PVCs, QTc 420  Recent Labs: 10/10/2018: ALT 25; BUN 19; Creatinine, Ser 1.21; Hemoglobin 16.3; Platelets 188.0; Potassium 4.0; Sodium 138; TSH 2.35   Recent Lipid Panel Lab Results  Component Value Date/Time   CHOL 128 10/10/2018 02:13 PM   CHOL 119 07/20/2017 08:58 AM   TRIG 151.0 (H) 10/10/2018 02:13 PM   HDL 30.70 (L) 10/10/2018 02:13 PM   HDL 31 (L) 07/20/2017 08:58 AM   CHOLHDL 4 10/10/2018 02:13 PM   LDLCALC 67 10/10/2018 02:13 PM   LDLCALC 55 07/20/2017 08:58 AM   LDLDIRECT 66.7 11/30/2011 01:26 PM    Wt Readings from Last 3 Encounters:  03/06/19 232 lb (105.2 kg)  01/24/19 246 lb 1.9 oz (111.6 kg)  10/10/18 244 lb (110.7 kg)     Objective:  Vital Signs:  BP 101/65   Pulse (!) 50   Ht 5\' 10"  (1.778 m)   Wt 232 lb (105.2 kg)   BMI 33.29 kg/m    VITAL SIGNS:  reviewed RESPIRATORY:  no labored breathing noted NEURO:  alert and oriented PSYCH:  he is in good spirits  ASSESSMENT & PLAN:    Epigastric pain Etiology of his symptoms are not entirely clear.  We attempted a video visit today.  However, his audio did not work.  Therefore, we had to complete his visit over the telephone.  Therefore, given the limitations of this visit, it is difficult to know the etiology of his symptoms.  However, by his description, I suspect he may have gallbladder disease or acid reflux that contributed to his symptoms.  His symptoms with his heart have been somewhat vague in the past.  However, he was able to note nausea and shortness of breath in the past.  He also had decreased exercise tolerance leading up to his previous syndrome.  He is currently doing well without recurrent symptoms.  I have suggested he continue to monitor his symptoms and contact us if he has anything that reminds him  of his previous myocardial infarction.  If he has recurrent symptoms that are severe, he should call 911 and go to the emergency room.  I have also encouraged him to contact his primary care provider to have his symptoms further evaluated.  Once routine testing is resumed, he may benefit from an abdominal ultrasound panel and hepatic function panel.  Coronary artery disease involving native coronary artery of native heart without angina pectoris History of prior CABG in 1991 and non-ST elevation myocardial infarction 2016 treated with DES to the SVG-RCA.  Nuclear stress test in 2019 was low risk.  As noted, he has not had any symptoms that are reminiscent of his previous angina.  However, I did ask him to continue to monitor his symptoms over the next few days.  If he develops any exertional shortness of breath, chest pain or decreased exercise tolerance, he should contact our office.  Continue aspirin, clopidogrel, metoprolol, atorvastatin.  AAA (abdominal aortic aneurysm) without rupture (HCC) 3.4 cm by ultrasound in December 2019.  Essential hypertension The patient's blood pressure is controlled on his current regimen.  Continue current therapy.   COVID-19 Education: The signs and symptoms of COVID-19 were discussed with the patient and how to seek care for testing (follow up with PCP or arrange E-visit).  The importance of social distancing was discussed today.  Time:   Today, I have spent 18 minutes with the patient with telehealth technology discussing the above problems.     Medication Adjustments/Labs and Tests Ordered: Current medicines are reviewed at length with the patient today.  Concerns regarding medicines are outlined above.   Tests Ordered: No orders of the defined types were placed in this encounter.   Medication Changes: No orders of the defined types were placed in this encounter.   Disposition:  Follow up in 5 month(s)  Signed, Richardson Dopp, PA-C  03/06/2019 4:02  PM    [facility]

## 2019-03-06 NOTE — Telephone Encounter (Signed)
S/w pt due to an episode pt had last night around 2 am. Pt woke pt up with upper abdomen pain.  Pt stated felt like a tightness,bloating and indigestion.  No loose stools and did not hurt to touch, did take bp, was a little elevated, explained this happens when pt has pain. Lasted about 90 min with pain on a scale from 1-10, 10 being the highest pain level was a 7.  Did subside, pt went back to sleep.  Woke up this am, bp returned to normal for pt and no more pain.  Pt stated did pt need to go to ER if this occurs again.  Stated does not sound cardiac and needs to reach out to PCP but will call pt back after advising with [name], NP.  Stated this could be a couple of hours. Will send to Cecille Rubin to advise.

## 2019-03-07 ENCOUNTER — Ambulatory Visit (INDEPENDENT_AMBULATORY_CARE_PROVIDER_SITE_OTHER): Payer: Medicare HMO | Admitting: Adult Health

## 2019-03-07 ENCOUNTER — Other Ambulatory Visit: Payer: Self-pay

## 2019-03-07 ENCOUNTER — Encounter: Payer: Self-pay | Admitting: Adult Health

## 2019-03-07 DIAGNOSIS — K219 Gastro-esophageal reflux disease without esophagitis: Secondary | ICD-10-CM

## 2019-03-07 NOTE — Progress Notes (Signed)
Virtual Visit via Video Note  I connected with Dave Robles on 03/07/19 at 11:00 AM EDT by a video enabled telemedicine application and verified that I am speaking with the correct person using two identifiers.  Location patient: home Location provider:work or home office Persons participating in the virtual visit: patient, provider  I discussed the limitations of evaluation and management by telemedicine and the availability of in person appointments. The patient expressed understanding and agreed to proceed.   HPI: 73 year old male who was evaluated yesterday by cardiology for complaints of abdominal pain.  Reports that around 2:30 in the morning, two nights ago he woke with epigastric abdominal discomfort.  This discomfort lasted about 90 minutes and then resolved.  He describes the feeling of "tightness across my stomach".  He did not have any chest discomfort or nausea, vomiting, or diarrhea.  Pain did not radiate.  That evening he had pasta with marinara sauce for dinner.  Since that occurrence he has not had any reoccurrences of the sensation.    Allergy ruled out any cardiac issues and wanted him to follow-up with me rule out possible GI issue.   ROS: See pertinent positives and negatives per HPI.  Past Medical History:  Diagnosis Date  . AAA (abdominal aortic aneurysm) ([place])   . Adjustment disorder with depressed mood 03/04/2009  . Arthritis   . Colon polyps   . CORONARY ARTERY DISEASE 07/10/2007  . Depression   . ERECTILE DYSFUNCTION 07/10/2007  . Extrinsic asthma, unspecified 09/10/2008  . HYPERLIPIDEMIA 07/10/2007  . HYPERTENSION 07/10/2007  . Myocardial infarction [facility]) 2016   when had angioplasty- was told had a silent MI  . TOBACCO USE 12/15/2009    Past Surgical History:  Procedure Laterality Date  . COLONOSCOPY    . CORONARY ANGIOPLASTY  12/2014  . CORONARY ARTERY BYPASS GRAFT  11/15/89   5 vessels  . HYDROCELE EXCISION / REPAIR  2011  . INGUINAL HERNIA REPAIR  Left 1985  . TOTAL HIP ARTHROPLASTY Right 04/19/2018   Procedure: RIGHT TOTAL HIP ARTHROPLASTY ANTERIOR APPROACH;  Surgeon: Gaynelle Arabian, MD;  Location: [facility];  Service: Orthopedics;  Laterality: Right;    Family History  Problem Relation Age of Onset  . Coronary artery disease Father        cabg  . Cancer Mother        Breast and or lung   . Depression Mother   . Hyperlipidemia Other   . Hypertension Other   . Depression Paternal Grandfather   . Colon cancer Neg Hx      Current Outpatient Medications:  .  acetaminophen (TYLENOL) 325 MG tablet, Take 650 mg by mouth every 6 (six) hours as needed for moderate pain., Disp: , Rfl:  .  aspirin 81 MG tablet, Take 81 mg by mouth daily.  , Disp: , Rfl:  .  atorvastatin (LIPITOR) 80 MG tablet, TAKE 1 TABLET DAILY, Disp: 90 tablet, Rfl: 2 .  buPROPion (WELLBUTRIN XL) 150 MG 24 hr tablet, Take 150 mg by mouth daily., Disp: , Rfl:  .  clopidogrel (PLAVIX) 75 MG tablet, Take 1 tablet (75 mg total) by mouth daily., Disp: 90 tablet, Rfl: 2 .  lisinopril-hydrochlorothiazide (PRINZIDE,ZESTORETIC) 20-12.5 MG tablet, Take 1 tablet by mouth daily., Disp: 90 tablet, Rfl: 2 .  metoprolol tartrate (LOPRESSOR) 25 MG tablet, Take 1 tablet (25 mg total) by mouth 2 (two) times daily., Disp: 180 tablet, Rfl: 2 .  Multiple Vitamin (MULTIVITAMIN) capsule, Take 1 capsule by mouth daily.  ,  Disp: , Rfl:  .  naproxen sodium (ALEVE) 220 MG tablet, Take 220 mg by mouth daily as needed., Disp: , Rfl:  .  nitroGLYCERIN (NITROSTAT) 0.4 MG SL tablet, Place 1 tablet (0.4 mg total) under the tongue every 5 (five) minutes as needed., Disp: 25 tablet, Rfl: 1 .  sildenafil (VIAGRA) 100 MG tablet, Take 100 mg by mouth daily as needed for erectile dysfunction., Disp: , Rfl:   EXAM:  VITALS per patient if applicable:  GENERAL: alert, oriented, appears well and in no acute distress  HEENT: atraumatic, conjunttiva clear, no obvious abnormalities on inspection of external nose  and ears  NECK: normal movements of the head and neck  LUNGS: on inspection no signs of respiratory distress, breathing rate appears normal, no obvious gross SOB, gasping or wheezing  CV: no obvious cyanosis  MS: moves all visible extremities without noticeable abnormality  PSYCH/NEURO: pleasant and cooperative, no obvious depression or anxiety, speech and thought processing grossly intact  ASSESSMENT AND PLAN:  Discussed the following assessment and plan:  Gastroesophageal reflux disease without esophagitis   Etiology is not entirely clear but by his description I suspect more acid reflux rather than gallbladder disease or peptic ulcer disease.  He was advised to keep a close eye on this issue, follow-up if symptoms come back or he notices a trend with his symptoms.    I discussed the assessment and treatment plan with the patient. The patient was provided an opportunity to ask questions and all were answered. The patient agreed with the plan and demonstrated an understanding of the instructions.   The patient was advised to call back or seek an in-person evaluation if the symptoms worsen or if the condition fails to improve as anticipated.   Dorothyann Peng, NP

## 2019-03-27 ENCOUNTER — Other Ambulatory Visit (HOSPITAL_COMMUNITY): Payer: Self-pay | Admitting: Nurse Practitioner

## 2019-03-27 DIAGNOSIS — I714 Abdominal aortic aneurysm, without rupture, unspecified: Secondary | ICD-10-CM

## 2019-04-05 ENCOUNTER — Encounter: Payer: Self-pay | Admitting: Adult Health

## 2019-04-05 ENCOUNTER — Other Ambulatory Visit: Payer: Self-pay | Admitting: Adult Health

## 2019-04-05 MED ORDER — AMOXICILLIN 500 MG PO CAPS: 2000.0000 mg | ORAL_CAPSULE | Freq: Once | ORAL | 0 refills | Status: AC

## 2019-04-19 DIAGNOSIS — R69 Illness, unspecified: Secondary | ICD-10-CM | POA: Diagnosis not present

## 2019-05-18 DIAGNOSIS — R69 Illness, unspecified: Secondary | ICD-10-CM | POA: Diagnosis not present

## 2019-05-22 DIAGNOSIS — L570 Actinic keratosis: Secondary | ICD-10-CM | POA: Diagnosis not present

## 2019-05-22 DIAGNOSIS — D1722 Benign lipomatous neoplasm of skin and subcutaneous tissue of left arm: Secondary | ICD-10-CM | POA: Diagnosis not present

## 2019-05-22 DIAGNOSIS — L821 Other seborrheic keratosis: Secondary | ICD-10-CM | POA: Diagnosis not present

## 2019-06-26 ENCOUNTER — Other Ambulatory Visit: Payer: Self-pay | Admitting: Adult Health

## 2019-06-27 NOTE — Telephone Encounter (Signed)
Pt called back, no answer at office.

## 2019-06-27 NOTE — Telephone Encounter (Signed)
Left a message for a return call.  Not filled since 2019.  Need to see if the pt is taking and how is he getting it refilled.

## 2019-06-27 NOTE — Telephone Encounter (Signed)
See note

## 2019-06-28 ENCOUNTER — Encounter: Payer: Self-pay | Admitting: Adult Health

## 2019-06-29 NOTE — Telephone Encounter (Signed)
Pt returned Mistys call. Pt says that Rx is written for 2 pills a day and he only take 1. Pt says that PCP is the last provider that wrote his Rx.

## 2019-06-29 NOTE — Telephone Encounter (Signed)
See request °

## 2019-07-02 ENCOUNTER — Encounter: Payer: Self-pay | Admitting: Adult Health

## 2019-07-05 ENCOUNTER — Other Ambulatory Visit: Payer: Self-pay

## 2019-07-05 ENCOUNTER — Telehealth (INDEPENDENT_AMBULATORY_CARE_PROVIDER_SITE_OTHER): Payer: Medicare HMO | Admitting: Adult Health

## 2019-07-05 DIAGNOSIS — R69 Illness, unspecified: Secondary | ICD-10-CM | POA: Diagnosis not present

## 2019-07-05 DIAGNOSIS — G479 Sleep disorder, unspecified: Secondary | ICD-10-CM

## 2019-07-05 DIAGNOSIS — F339 Major depressive disorder, recurrent, unspecified: Secondary | ICD-10-CM

## 2019-07-05 MED ORDER — TRAZODONE HCL 50 MG PO TABS: 50.0000 mg | ORAL_TABLET | Freq: Every evening | ORAL | 1 refills | Status: DC | PRN

## 2019-07-05 NOTE — Progress Notes (Signed)
Virtual Visit via Video Note  I connected with Burke Keels on 07/05/19 at 10:30 AM EDT by a video enabled telemedicine application and verified that I am speaking with the correct person using two identifiers.  Location patient: home Location provider:work or home office Persons participating in the virtual visit: patient, provider  I discussed the limitations of evaluation and management by telemedicine and the availability of in person appointments. The patient expressed understanding and agreed to proceed.   HPI: 73 year old male who is being evaluated today for chronic depression as well as insomnia.  Is currently maintained on Wellbutrin 150 mg extended release daily.  He has been on this medication is 2017.  Past he is done very well on this medication but he feels as though "the medication is not working as well as it once did".  He finds that he is not enjoying things that he once did in the past has not feel like doing anything.  He denies any suicidal ideation.  Additionally over the last 2 to 3 months he has had worsening issues with insomnia.  He has trouble staying asleep as well as trouble falling asleep.  He does not feel as though his mind is racing and he does not have any anxiety.  He does not have a TV in the bedroom and tries to practice good sleep hygiene.  Currently he is taking an over-the-counter sleep aid with 25 mg of Benadryl and 15 mg of melatonin and does not find this helpful.   ROS: See pertinent positives and negatives per HPI.  Past Medical History:  Diagnosis Date  . AAA (abdominal aortic aneurysm) ([place])   . Adjustment disorder with depressed mood 03/04/2009  . Arthritis   . Colon polyps   . CORONARY ARTERY DISEASE 07/10/2007  . Depression   . ERECTILE DYSFUNCTION 07/10/2007  . Extrinsic asthma, unspecified 09/10/2008  . HYPERLIPIDEMIA 07/10/2007  . HYPERTENSION 07/10/2007  . Myocardial infarction [facility]) 2016   when had angioplasty- was told had a silent  MI  . TOBACCO USE 12/15/2009    Past Surgical History:  Procedure Laterality Date  . COLONOSCOPY    . CORONARY ANGIOPLASTY  12/2014  . CORONARY ARTERY BYPASS GRAFT  11/15/89   5 vessels  . HYDROCELE EXCISION / REPAIR  2011  . INGUINAL HERNIA REPAIR Left 1985  . TOTAL HIP ARTHROPLASTY Right 04/19/2018   Procedure: RIGHT TOTAL HIP ARTHROPLASTY ANTERIOR APPROACH;  Surgeon: Gaynelle Arabian, MD;  Location: [facility];  Service: Orthopedics;  Laterality: Right;    Family History  Problem Relation Age of Onset  . Coronary artery disease Father        cabg  . Cancer Mother        Breast and or lung   . Depression Mother   . Hyperlipidemia Other   . Hypertension Other   . Depression Paternal Grandfather   . Colon cancer Neg Hx      Current Outpatient Medications:  .  acetaminophen (TYLENOL) 325 MG tablet, Take 650 mg by mouth every 6 (six) hours as needed for moderate pain., Disp: , Rfl:  .  aspirin 81 MG tablet, Take 81 mg by mouth daily.  , Disp: , Rfl:  .  atorvastatin (LIPITOR) 80 MG tablet, TAKE 1 TABLET DAILY, Disp: 90 tablet, Rfl: 2 .  buPROPion (WELLBUTRIN XL) 150 MG 24 hr tablet, Take 150 mg by mouth daily., Disp: , Rfl:  .  clopidogrel (PLAVIX) 75 MG tablet, Take 1 tablet (75 mg total)  by mouth daily., Disp: 90 tablet, Rfl: 2 .  lisinopril-hydrochlorothiazide (PRINZIDE,ZESTORETIC) 20-12.5 MG tablet, Take 1 tablet by mouth daily., Disp: 90 tablet, Rfl: 2 .  metoprolol tartrate (LOPRESSOR) 25 MG tablet, Take 1 tablet (25 mg total) by mouth 2 (two) times daily., Disp: 180 tablet, Rfl: 2 .  Multiple Vitamin (MULTIVITAMIN) capsule, Take 1 capsule by mouth daily.  , Disp: , Rfl:  .  naproxen sodium (ALEVE) 220 MG tablet, Take 220 mg by mouth daily as needed., Disp: , Rfl:  .  nitroGLYCERIN (NITROSTAT) 0.4 MG SL tablet, Place 1 tablet (0.4 mg total) under the tongue every 5 (five) minutes as needed., Disp: 25 tablet, Rfl: 1 .  sildenafil (VIAGRA) 100 MG tablet, Take 100 mg by mouth daily as  needed for erectile dysfunction., Disp: , Rfl:   EXAM:  VITALS per patient if applicable:  GENERAL: alert, oriented, appears well and in no acute distress  HEENT: atraumatic, conjunttiva clear, no obvious abnormalities on inspection of external nose and ears  NECK: normal movements of the head and neck  LUNGS: on inspection no signs of respiratory distress, breathing rate appears normal, no obvious gross SOB, gasping or wheezing  CV: no obvious cyanosis  MS: moves all visible extremities without noticeable abnormality  PSYCH/NEURO: pleasant and cooperative, no obvious depression or anxiety, speech and thought processing grossly intact  ASSESSMENT AND PLAN:  Discussed the following assessment and plan:  1. Sleep disturbance -We will trial him on trazodone for both sleep disturbance as well as recurrent depression.  He can stop taking Benadryl and melatonin -Vies follow-up in 30 days or sooner if needed - traZODone (DESYREL) 50 MG tablet; Take 1 tablet (50 mg total) by mouth at bedtime as needed for sleep.  Dispense: 30 tablet; Refill: 1  2. Depression, recurrent ([place]) -We will add trazodone for adjunct therapy.  Continue with Wellbutrin extended release 150 mg daily.  Have him follow-up in 30 days or sooner if needed. - traZODone (DESYREL) 50 MG tablet; Take 1 tablet (50 mg total) by mouth at bedtime as needed for sleep.  Dispense: 30 tablet; Refill: 1     I discussed the assessment and treatment plan with the patient. The patient was provided an opportunity to ask questions and all were answered. The patient agreed with the plan and demonstrated an understanding of the instructions.   The patient was advised to call back or seek an in-person evaluation if the symptoms worsen or if the condition fails to improve as anticipated.   Dorothyann Peng, NP

## 2019-07-09 DIAGNOSIS — H40023 Open angle with borderline findings, high risk, bilateral: Secondary | ICD-10-CM | POA: Diagnosis not present

## 2019-07-09 DIAGNOSIS — H35033 Hypertensive retinopathy, bilateral: Secondary | ICD-10-CM | POA: Diagnosis not present

## 2019-07-09 DIAGNOSIS — H43812 Vitreous degeneration, left eye: Secondary | ICD-10-CM | POA: Diagnosis not present

## 2019-07-09 DIAGNOSIS — H40053 Ocular hypertension, bilateral: Secondary | ICD-10-CM | POA: Diagnosis not present

## 2019-07-11 ENCOUNTER — Encounter: Payer: Self-pay | Admitting: Adult Health

## 2019-07-27 ENCOUNTER — Other Ambulatory Visit: Payer: Self-pay | Admitting: Adult Health

## 2019-07-27 MED ORDER — BUPROPION HCL ER (XL) 150 MG PO TB24: 150.0000 mg | ORAL_TABLET | Freq: Every day | ORAL | 0 refills | Status: DC

## 2019-07-27 NOTE — Progress Notes (Signed)
CARDIOLOGY OFFICE NOTE  Date:  07/30/2019    Dave Robles Date of Birth: April 21, 1946 Medical Record [id number]  PCP:  Dave Peng, NP  Cardiologist:  Dave Robles     Chief Complaint  Patient presents with  . Coronary Artery Disease    Follow up visit.     History of Present Illness: Dave Robles is a 73 y.o. male who presents today for a 4 month check. Former patient of Dr. Claris Robles - typically follows with me.  He has a history of known CAD with remote CABG in 1991, HTN, HLD, ED, past tobacco abuse, asthma, AAA (last measured in 10/2018) and depression.He had a non-ST elevation myocardial infarction in 2016 while vacationing in [place].  Cath noted to have NL left main, 90% prox LAD, 99% mid LAD with patent LIMA to mid LAD and patent SVG to DX. LCX origin stenosis vs flow void? Fistula vs competitive flow in branch?, 100% RCA, SVG to RCA 99% origin treated with Xience DES and PTCA to the distal.  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 Burt Robles did NOT feel any treatment is necessary - this is oxygenated blood going to oxygenated blood.  Myoview done in April 2019 for atypical shoulder pain was low risk with EF 54 and no ischemia.  He has had mild LV dysfunction.   I last saw him in March of 2020. He has cardiology arranged in [place] due to having a home there as well.   He called with abdominal pain back in April - had a telehealth visit with Dave Dopp, PA. No intervention/evaluation warranted.   The patient does not have symptoms concerning for COVID-19 infection (fever, chills, cough, or new shortness of breath).   Comes in today. Here alone. He is doing well. No chest pain. Breathing is good. Riding his bike about 100 miles per week. Trying to work on his weight - his doctor in [place] suggested more of a plant based diet - he did lose weight but felt  like it was more of a muscle mass loss. No more abdominal pain. BP is good at home. He feels like he is doing well. Having his labs with PCP in just a few weeks. Some bruising with his Plavix. Overall, he feels like he is doing well and has no current concerns.   Past Medical History:  Diagnosis Date  . AAA (abdominal aortic aneurysm) ([place])   . Adjustment disorder with depressed mood 03/04/2009  . Arthritis   . Colon polyps   . CORONARY ARTERY DISEASE 07/10/2007  . Depression   . ERECTILE DYSFUNCTION 07/10/2007  . Extrinsic asthma, unspecified 09/10/2008  . HYPERLIPIDEMIA 07/10/2007  . HYPERTENSION 07/10/2007  . Myocardial infarction [facility]) 2016   when had angioplasty- was told had a silent MI  . TOBACCO USE 12/15/2009    Past Surgical History:  Procedure Laterality Date  . COLONOSCOPY    . CORONARY ANGIOPLASTY  12/2014  . CORONARY ARTERY BYPASS GRAFT  11/15/89   5 vessels  . HYDROCELE EXCISION / REPAIR  2011  . INGUINAL HERNIA REPAIR Left 1985  . TOTAL HIP ARTHROPLASTY Right 04/19/2018   Procedure: RIGHT TOTAL HIP ARTHROPLASTY ANTERIOR APPROACH;  Surgeon: Dave Arabian, MD;  Location: [facility];  Service: Orthopedics;  Laterality: Right;     Medications: Current Meds  Medication Sig  . acetaminophen (TYLENOL) 325 MG tablet Take  650 mg by mouth every 6 (six) hours as needed for moderate pain.  Marland Kitchen amoxicillin (AMOXIL) 500 MG capsule Use as directed  . aspirin 81 MG tablet Take 81 mg by mouth daily.    Marland Kitchen atorvastatin (LIPITOR) 80 MG tablet TAKE 1 TABLET DAILY  . buPROPion (WELLBUTRIN XL) 150 MG 24 hr tablet Take 1 tablet (150 mg total) by mouth daily.  . clopidogrel (PLAVIX) 75 MG tablet Take 1 tablet (75 mg total) by mouth daily.  Marland Kitchen lisinopril-hydrochlorothiazide (PRINZIDE,ZESTORETIC) 20-12.5 MG tablet Take 1 tablet by mouth daily.  . metoprolol tartrate (LOPRESSOR) 25 MG tablet Take 1 tablet (25 mg total) by mouth 2 (two) times daily.  . Multiple Vitamin (MULTIVITAMIN) capsule Take 1  capsule by mouth daily.    . naproxen sodium (ALEVE) 220 MG tablet Take 220 mg by mouth daily as needed.  . nitroGLYCERIN (NITROSTAT) 0.4 MG SL tablet Place 1 tablet (0.4 mg total) under the tongue every 5 (five) minutes as needed.  . sildenafil (VIAGRA) 100 MG tablet Take 100 mg by mouth daily as needed for erectile dysfunction.  . traZODone (DESYREL) 50 MG tablet Take 1 tablet (50 mg total) by mouth at bedtime as needed for sleep.     Allergies: No Known Allergies  Social History: The patient  reports that he quit smoking about 9 years ago. He smoked 0.50 packs per day. He has never used smokeless tobacco. He reports current alcohol use of about 5.0 standard drinks of alcohol per week. He reports that he does not use drugs.   Family History: The patient's family history includes Cancer in his mother; Coronary artery disease in his father; Depression in his mother and paternal grandfather; Hyperlipidemia in an other family member; Hypertension in an other family member.   Review of Systems: Please see the history of present illness.   All other systems are reviewed and negative.   Physical Exam: VS:  BP 140/80   Pulse (!) 50   Ht 5\' 10"  (1.778 m)   Wt 240 lb 1.9 oz (108.9 kg)   SpO2 95%   BMI 34.45 kg/m  .  BMI Body mass index is 34.45 kg/m.  Wt Readings from Last 3 Encounters:  07/30/19 240 lb 1.9 oz (108.9 kg)  03/06/19 232 lb (105.2 kg)  01/24/19 246 lb 1.9 oz (111.6 kg)    General: Pleasant. Well developed, well nourished and in no acute distress.  Remains obese.  HEENT: Normal.  Neck: Supple, no JVD, carotid bruits, or masses noted.  Cardiac: Regular rate and rhythm. Heart tones are distant.  Respiratory:  Lungs are clear to auscultation bilaterally with normal work of breathing.  GI: Soft and nontender.  MS: No deformity or atrophy. Gait and ROM intact.  Skin: Warm and dry. Color is normal.  Neuro:  Strength and sensation are intact and no gross focal deficits  noted.  Psych: Alert, appropriate and with normal affect.   LABORATORY DATA:  EKG:  EKG is not ordered today.  Lab Results  Component Value Date   WBC 8.1 10/10/2018   HGB 16.3 10/10/2018   HCT 47.4 10/10/2018   PLT 188.0 10/10/2018   GLUCOSE 96 10/10/2018   CHOL 128 10/10/2018   TRIG 151.0 (H) 10/10/2018   HDL 30.70 (L) 10/10/2018   LDLDIRECT 66.7 11/30/2011   LDLCALC 67 10/10/2018   ALT 25 10/10/2018   AST 21 10/10/2018   NA 138 10/10/2018   K 4.0 10/10/2018   CL 102 10/10/2018  CREATININE 1.21 10/10/2018   BUN 19 10/10/2018   CO2 26 10/10/2018   TSH 2.35 10/10/2018   PSA 1.51 10/10/2018   INR 1.06 04/11/2018     BNP (last 3 results) No results for input(s): BNP in the last 8760 hours.  ProBNP (last 3 results) No results for input(s): PROBNP in the last 8760 hours.   Other Studies Reviewed Today:  AAA [place] 11/01/2018 Summary: Abdominal Aorta: There is evidence of abnormal dilitation of the Proximal Abdominal aorta. The largest aortic measurement is 3.4 cm. The largest aortic diameter remains essentially unchanged compared to prior exam. Previous diameter measurement was 3.6  cm obtained on 12/18. Stenosis: +------------------+-------------+ Location Stenosis  +------------------+-------------+ Right Common Iliac>50% stenosis +------------------+-------------+  MyoviewStudy Highlights4/2019   Nuclear stress EF: 54%.  There is a small defect of mild severity present in the basal inferior location. The defect is non-reversible and consistent with diaphragmatic attenuation artifact. No ischemia noted.  The left ventricular ejection fraction is mildly decreased (45-54%).  Compared to study of 2014, fixed basal inferior defect is new.  There was no ST segment deviation noted during stress.  This is a low risk study.    Echo Study Conclusions from 03/2015  - Left ventricle: The cavity size was normal. Systolic function was  normal. The estimated ejection fraction was in the range of 55% to 60%. Wall motion was normal; there were no regional wall motion abnormalities. Left ventricular diastolic function parameters were normal. - Left atrium: The atrium was mildly dilated. - Atrial septum: No defect or patent foramen ovale was identified    Assessment/Plan:  1. Prior abdominal pain - resolved.   2. CAD - remote CABG - prior NSTEMI in 2016 with DES to the SVG to RCA - low risk Myoview from 2019 - has never actually had a chest pain syndrome - he is on chronic DAPT therapy - doing well clinically - no changes made today.   3. HTN - BP better at home - he will continue to monitor - for now, no changes made today.   4. HLD - on statin - having labs soon with his PCP  5. AAA - will need follow up study in December 2020 - we will go ahead and arrange today.   6. Prior LV dysfunction - noted normal EF by echo back in 2016 - asymptomatic. He is on ACE and beta blocker therapy.   7. Obesity - interested in seeing the nutritionist - referral made today.   8. COVID-19 Education: The signs and symptoms of COVID-19 were discussed with the patient and how to seek care for testing (follow up with PCP or arrange E-visit).  The importance of social distancing, staying at home, hand hygiene and wearing a mask when out in public were discussed today.  Current medicines are reviewed with the patient today.  The patient does not have concerns regarding medicines other than what has been noted above.  The following changes have been made:  See above.  Labs/ tests ordered today include:    Orders Placed This Encounter  Procedures  . Ambulatory referral to Nutrition and Diabetic Education  . VAS [place] AAA DUPLEX     Disposition:   FU with me in 6 months.   Patient is agreeable to this plan and will call if any problems develop in the interim.   SignedTruitt Merle, NP  07/30/2019 8:51 AM  [place] [address] [place], [place]  [address] Phone: [phone] Fax: [phone]

## 2019-07-30 ENCOUNTER — Ambulatory Visit (INDEPENDENT_AMBULATORY_CARE_PROVIDER_SITE_OTHER): Payer: Medicare HMO | Admitting: Nurse Practitioner

## 2019-07-30 ENCOUNTER — Encounter: Payer: Self-pay | Admitting: Nurse Practitioner

## 2019-07-30 ENCOUNTER — Other Ambulatory Visit: Payer: Self-pay

## 2019-07-30 VITALS — BP 140/80 | HR 50 | Ht 70.0 in | Wt 240.1 lb

## 2019-07-30 DIAGNOSIS — I10 Essential (primary) hypertension: Secondary | ICD-10-CM

## 2019-07-30 DIAGNOSIS — E7800 Pure hypercholesterolemia, unspecified: Secondary | ICD-10-CM

## 2019-07-30 DIAGNOSIS — I714 Abdominal aortic aneurysm, without rupture, unspecified: Secondary | ICD-10-CM

## 2019-07-30 DIAGNOSIS — I2510 Atherosclerotic heart disease of native coronary artery without angina pectoris: Secondary | ICD-10-CM | POA: Diagnosis not present

## 2019-07-30 DIAGNOSIS — Z7189 Other specified counseling: Secondary | ICD-10-CM

## 2019-07-30 NOTE — Patient Instructions (Addendum)
After Visit Summary:  We will be checking the following labs today - NONE   Medication Instructions:    Continue with your current medicines.    If you need a refill on your cardiac medications before your next appointment, please call your pharmacy.     Testing/Procedures To Be Arranged:  AAA duplex in December  Follow-Up:   See me in 6 months    At [facility] you and your health needs are our priority.  As part of our continuing mission to provide you with exceptional heart care, we have created designated Provider Care Teams.  These Care Teams include your primary Cardiologist (physician) and Advanced Practice Providers (APPs -  Physician Assistants and Nurse Practitioners) who all work together to provide you with the care you need, when you need it.  Special Instructions:  . Stay safe, stay home, wash your hands for at least 20 seconds and wear a mask when out in public.  . It was good to talk with you today.  . Stay active . I have placed an order to go see the nutritionist   Call the [place] office at [phone] if you have any questions, problems or concerns.

## 2019-07-31 DIAGNOSIS — R69 Illness, unspecified: Secondary | ICD-10-CM | POA: Diagnosis not present

## 2019-08-03 ENCOUNTER — Encounter: Payer: Self-pay | Admitting: Adult Health

## 2019-08-03 NOTE — Telephone Encounter (Signed)
Bupropion was sent to [facility] mail order on 07/27/19. I will ask the patient if refills need to be sent to [facility].  Please advise on trazadone.

## 2019-08-06 ENCOUNTER — Encounter: Payer: Self-pay | Admitting: *Deleted

## 2019-08-11 ENCOUNTER — Ambulatory Visit: Payer: Medicare HMO

## 2019-08-16 ENCOUNTER — Ambulatory Visit (INDEPENDENT_AMBULATORY_CARE_PROVIDER_SITE_OTHER): Payer: Medicare HMO | Admitting: Adult Health

## 2019-08-16 ENCOUNTER — Other Ambulatory Visit: Payer: Self-pay

## 2019-08-16 ENCOUNTER — Encounter: Payer: Self-pay | Admitting: Adult Health

## 2019-08-16 VITALS — BP 130/70 | Temp 98.0°F | Wt 236.0 lb

## 2019-08-16 DIAGNOSIS — R32 Unspecified urinary incontinence: Secondary | ICD-10-CM | POA: Diagnosis not present

## 2019-08-16 DIAGNOSIS — G479 Sleep disorder, unspecified: Secondary | ICD-10-CM

## 2019-08-16 LAB — POCT URINALYSIS DIPSTICK
Bilirubin, UA: NEGATIVE
Blood, UA: NEGATIVE
Glucose, UA: NEGATIVE
Ketones, UA: NEGATIVE
Leukocytes, UA: NEGATIVE
Nitrite, UA: NEGATIVE
Odor: NEGATIVE
Protein, UA: NEGATIVE
Spec Grav, UA: 1.02 (ref 1.010–1.025)
Urobilinogen, UA: 1 E.U./dL
pH, UA: 6 (ref 5.0–8.0)

## 2019-08-16 MED ORDER — TRAZODONE HCL 100 MG PO TABS: 100.0000 mg | ORAL_TABLET | Freq: Every evening | ORAL | 1 refills | Status: DC | PRN

## 2019-08-16 NOTE — Progress Notes (Signed)
Subjective:    Patient ID: Dave Robles, male    DOB: 1946-02-12, 73 y.o.   MRN: [id number]  HPI 73 year old male who is being evaluated today for follow-up regarding trazodone.  He was prescribed trazodone 50 mg about 6 weeks ago for sleep disturbance.  He reports that since starting this medication his sleep has vastly improved.  The medication has made a easier to fall asleep as well as staying asleep longer.  He continues to wake up some ends around 430 but is unable to fall back asleep though.  Denies feeling drowsy in the morning.  For the last month he is also noticed that he has had some light bladder leaking, reports "may be a drop or 2".  He denies frequency urgency, dysuria or hematuria.  He is getting up 1 time a night to urinate which is normal for him.   Review of Systems See HPI   Past Medical History:  Diagnosis Date  . AAA (abdominal aortic aneurysm) ([place])   . Adjustment disorder with depressed mood 03/04/2009  . Arthritis   . Colon polyps   . CORONARY ARTERY DISEASE 07/10/2007  . Depression   . ERECTILE DYSFUNCTION 07/10/2007  . Extrinsic asthma, unspecified 09/10/2008  . HYPERLIPIDEMIA 07/10/2007  . HYPERTENSION 07/10/2007  . Myocardial infarction [facility]) 2016   when had angioplasty- was told had a silent MI  . TOBACCO USE 12/15/2009    Social History   Socioeconomic History  . Marital status: Married    Spouse name: Not on file  . Number of children: Not on file  . Years of education: Not on file  . Highest education level: Not on file  Occupational History  . Not on file  Social Needs  . Financial resource strain: Not on file  . Food insecurity    Worry: Not on file    Inability: Not on file  . Transportation needs    Medical: Not on file    Non-medical: Not on file  Tobacco Use  . Smoking status: Former Smoker    Packs/day: 0.50    Quit date: 01/13/2010    Years since quitting: 9.5  . Smokeless tobacco: Never Used  Substance and Sexual Activity   . Alcohol use: Yes    Alcohol/week: 5.0 standard drinks    Types: 5 Cans of beer per week  . Drug use: No  . Sexual activity: Yes    Comment: regular exercise - yes  Lifestyle  . Physical activity    Days per week: Not on file    Minutes per session: Not on file  . Stress: Not on file  Relationships  . Social Herbalist on phone: Not on file    Gets together: Not on file    Attends religious service: Not on file    Active member of club or organization: Not on file    Attends meetings of clubs or organizations: Not on file    Relationship status: Not on file  . Intimate partner violence    Fear of current or ex partner: Not on file    Emotionally abused: Not on file    Physically abused: Not on file    Forced sexual activity: Not on file  Other Topics Concern  . Not on file  Social History Narrative   Retired from being an Financial controller of a small business    Married    No kids       He likes to  play golf and rides bike        Past Surgical History:  Procedure Laterality Date  . COLONOSCOPY    . CORONARY ANGIOPLASTY  12/2014  . CORONARY ARTERY BYPASS GRAFT  11/15/89   5 vessels  . HYDROCELE EXCISION / REPAIR  2011  . INGUINAL HERNIA REPAIR Left 1985  . TOTAL HIP ARTHROPLASTY Right 04/19/2018   Procedure: RIGHT TOTAL HIP ARTHROPLASTY ANTERIOR APPROACH;  Surgeon: Gaynelle Arabian, MD;  Location: [facility];  Service: Orthopedics;  Laterality: Right;    Family History  Problem Relation Age of Onset  . Coronary artery disease Father        cabg  . Cancer Mother        Breast and or lung   . Depression Mother   . Hyperlipidemia Other   . Hypertension Other   . Depression Paternal Grandfather   . Colon cancer Neg Hx     No Known Allergies  Current Outpatient Medications on File Prior to Visit  Medication Sig Dispense Refill  . acetaminophen (TYLENOL) 325 MG tablet Take 650 mg by mouth every 6 (six) hours as needed for moderate pain.    Marland Kitchen aspirin 81 MG tablet Take  81 mg by mouth daily.      Marland Kitchen atorvastatin (LIPITOR) 80 MG tablet TAKE 1 TABLET DAILY 90 tablet 2  . buPROPion (WELLBUTRIN XL) 150 MG 24 hr tablet Take 1 tablet (150 mg total) by mouth daily. 90 tablet 0  . clopidogrel (PLAVIX) 75 MG tablet Take 1 tablet (75 mg total) by mouth daily. 90 tablet 2  . lisinopril-hydrochlorothiazide (PRINZIDE,ZESTORETIC) 20-12.5 MG tablet Take 1 tablet by mouth daily. 90 tablet 2  . metoprolol tartrate (LOPRESSOR) 25 MG tablet Take 1 tablet (25 mg total) by mouth 2 (two) times daily. 180 tablet 2  . Multiple Vitamin (MULTIVITAMIN) capsule Take 1 capsule by mouth daily.      . naproxen sodium (ALEVE) 220 MG tablet Take 220 mg by mouth daily as needed.    . nitroGLYCERIN (NITROSTAT) 0.4 MG SL tablet Place 1 tablet (0.4 mg total) under the tongue every 5 (five) minutes as needed. 25 tablet 1  . sildenafil (VIAGRA) 100 MG tablet Take 100 mg by mouth daily as needed for erectile dysfunction.    Marland Kitchen amoxicillin (AMOXIL) 500 MG capsule Use as directed     No current facility-administered medications on file prior to visit.     BP 130/70   Temp 98 F (36.7 C) (Temporal)   Wt 236 lb (107 kg)   BMI 33.86 kg/m       Objective:   Physical Exam Vitals signs and nursing note reviewed.  Constitutional:      Appearance: Normal appearance.  Abdominal:     General: Abdomen is flat. Bowel sounds are normal.     Palpations: Abdomen is soft.  Musculoskeletal: Normal range of motion.  Skin:    General: Skin is warm and dry.  Neurological:     General: No focal deficit present.     Mental Status: He is alert and oriented to person, place, and time.  Psychiatric:        Mood and Affect: Mood normal.        Behavior: Behavior normal.        Thought Content: Thought content normal.        Judgment: Judgment normal.       Assessment & Plan:  1. Sleep disturbance -We will increase trazodone to 100 mg at  night to see if he can get a longer sleep. - traZODone (DESYREL)  100 MG tablet; Take 1 tablet (100 mg total) by mouth at bedtime as needed for sleep.  Dispense: 90 tablet; Refill: 1  2. Urinary incontinence, unspecified type -Doubt infection or medication reaction.  Will check urinalysis.  Gave instructions for [place] exercises -Follow-up as needed - POC Urinalysis Dipstick  Dorothyann Peng, NP

## 2019-10-09 ENCOUNTER — Encounter: Payer: Self-pay | Admitting: Adult Health

## 2019-10-09 DIAGNOSIS — E7800 Pure hypercholesterolemia, unspecified: Secondary | ICD-10-CM

## 2019-10-09 DIAGNOSIS — I25810 Atherosclerosis of coronary artery bypass graft(s) without angina pectoris: Secondary | ICD-10-CM

## 2019-10-09 DIAGNOSIS — I10 Essential (primary) hypertension: Secondary | ICD-10-CM

## 2019-10-09 MED ORDER — NITROGLYCERIN 0.4 MG SL SUBL: 0.4000 mg | SUBLINGUAL_TABLET | SUBLINGUAL | 0 refills | Status: AC | PRN

## 2019-10-09 MED ORDER — CLOPIDOGREL BISULFATE 75 MG PO TABS: 75.0000 mg | ORAL_TABLET | Freq: Every day | ORAL | 0 refills | Status: DC

## 2019-10-09 MED ORDER — METOPROLOL TARTRATE 25 MG PO TABS: 25.0000 mg | ORAL_TABLET | Freq: Two times a day (BID) | ORAL | 0 refills | Status: DC

## 2019-10-09 MED ORDER — LISINOPRIL-HYDROCHLOROTHIAZIDE 20-12.5 MG PO TABS: 1.0000 | ORAL_TABLET | Freq: Every day | ORAL | 0 refills | Status: DC

## 2019-10-09 MED ORDER — ATORVASTATIN CALCIUM 80 MG PO TABS: ORAL_TABLET | ORAL | 0 refills | Status: DC

## 2019-10-09 MED ORDER — BUPROPION HCL ER (XL) 150 MG PO TB24: 150.0000 mg | ORAL_TABLET | Freq: Every day | ORAL | 0 refills | Status: DC

## 2019-10-09 NOTE — Telephone Encounter (Signed)
Prescriptions have been sent to the pharmacy by e-scribe as pt requested.

## 2019-10-16 ENCOUNTER — Encounter: Payer: Medicare HMO | Admitting: Adult Health

## 2019-10-22 ENCOUNTER — Other Ambulatory Visit: Payer: Self-pay | Admitting: Nurse Practitioner

## 2019-10-22 ENCOUNTER — Other Ambulatory Visit: Payer: Self-pay

## 2019-10-22 ENCOUNTER — Ambulatory Visit (HOSPITAL_COMMUNITY)
Admission: RE | Admit: 2019-10-22 | Discharge: 2019-10-22 | Disposition: A | Discharge status: Home / Self Care | Payer: Medicare HMO | Source: Ambulatory Visit | Attending: Cardiology | Admitting: Cardiology

## 2019-10-22 DIAGNOSIS — I10 Essential (primary) hypertension: Secondary | ICD-10-CM | POA: Diagnosis not present

## 2019-10-22 DIAGNOSIS — Z7189 Other specified counseling: Secondary | ICD-10-CM

## 2019-10-22 DIAGNOSIS — I714 Abdominal aortic aneurysm, without rupture, unspecified: Secondary | ICD-10-CM

## 2019-10-22 DIAGNOSIS — E7800 Pure hypercholesterolemia, unspecified: Secondary | ICD-10-CM

## 2019-10-22 DIAGNOSIS — I2510 Atherosclerotic heart disease of native coronary artery without angina pectoris: Secondary | ICD-10-CM | POA: Insufficient documentation

## 2019-10-24 DIAGNOSIS — M79641 Pain in right hand: Secondary | ICD-10-CM | POA: Diagnosis not present

## 2019-10-24 DIAGNOSIS — M25531 Pain in right wrist: Secondary | ICD-10-CM | POA: Diagnosis not present

## 2019-10-30 DIAGNOSIS — Z20828 Contact with and (suspected) exposure to other viral communicable diseases: Secondary | ICD-10-CM | POA: Diagnosis not present

## 2019-11-02 DIAGNOSIS — D225 Melanocytic nevi of trunk: Secondary | ICD-10-CM | POA: Diagnosis not present

## 2019-11-02 DIAGNOSIS — Z86018 Personal history of other benign neoplasm: Secondary | ICD-10-CM | POA: Diagnosis not present

## 2019-11-02 DIAGNOSIS — D1722 Benign lipomatous neoplasm of skin and subcutaneous tissue of left arm: Secondary | ICD-10-CM | POA: Diagnosis not present

## 2019-11-02 DIAGNOSIS — D1721 Benign lipomatous neoplasm of skin and subcutaneous tissue of right arm: Secondary | ICD-10-CM | POA: Diagnosis not present

## 2019-11-02 DIAGNOSIS — L821 Other seborrheic keratosis: Secondary | ICD-10-CM | POA: Diagnosis not present

## 2019-11-02 DIAGNOSIS — L570 Actinic keratosis: Secondary | ICD-10-CM | POA: Diagnosis not present

## 2019-11-02 DIAGNOSIS — Z808 Family history of malignant neoplasm of other organs or systems: Secondary | ICD-10-CM | POA: Diagnosis not present

## 2019-11-29 ENCOUNTER — Encounter: Payer: Self-pay | Admitting: Adult Health

## 2019-11-29 NOTE — Telephone Encounter (Signed)
Hi Dave Robles,  I am not sure if you can or not. Below Is the phone number to our clinic that administers the vaccine. You may can call them for clarification as well as schedule an appointment when appropriate.      Here is the information regarding [place] COVID-19 Vaccination [facility]:    Appointments required.  Open to those age 73 or older  Not restricted to [facility] or [facility] residents  Location: [place], [place], [place] daily beginning: Saturday, November 24, 2019  Hours: 8 a.m. to 1 p.m.   Please visit [email or web address] to register or call [phone]  There will be no copay required for the vaccine. Insurance information will be requested if available.    Hope this helps!

## 2019-12-03 DIAGNOSIS — R69 Illness, unspecified: Secondary | ICD-10-CM | POA: Diagnosis not present

## 2019-12-06 ENCOUNTER — Encounter: Payer: Self-pay | Admitting: *Deleted

## 2019-12-10 DIAGNOSIS — M545 Low back pain: Secondary | ICD-10-CM | POA: Diagnosis not present

## 2019-12-24 ENCOUNTER — Encounter: Payer: Self-pay | Admitting: Adult Health

## 2019-12-28 DIAGNOSIS — L570 Actinic keratosis: Secondary | ICD-10-CM | POA: Diagnosis not present

## 2019-12-28 DIAGNOSIS — L821 Other seborrheic keratosis: Secondary | ICD-10-CM | POA: Diagnosis not present

## 2020-01-09 ENCOUNTER — Other Ambulatory Visit: Payer: Self-pay

## 2020-01-10 ENCOUNTER — Ambulatory Visit (INDEPENDENT_AMBULATORY_CARE_PROVIDER_SITE_OTHER): Payer: Medicare HMO | Admitting: Adult Health

## 2020-01-10 ENCOUNTER — Encounter: Payer: Self-pay | Admitting: Adult Health

## 2020-01-10 VITALS — BP 122/72 | Temp 97.8°F | Ht 69.75 in | Wt 240.0 lb

## 2020-01-10 DIAGNOSIS — F339 Major depressive disorder, recurrent, unspecified: Secondary | ICD-10-CM

## 2020-01-10 DIAGNOSIS — F5101 Primary insomnia: Secondary | ICD-10-CM

## 2020-01-10 DIAGNOSIS — I10 Essential (primary) hypertension: Secondary | ICD-10-CM | POA: Diagnosis not present

## 2020-01-10 DIAGNOSIS — Z125 Encounter for screening for malignant neoplasm of prostate: Secondary | ICD-10-CM | POA: Diagnosis not present

## 2020-01-10 DIAGNOSIS — Z0000 Encounter for general adult medical examination without abnormal findings: Secondary | ICD-10-CM | POA: Diagnosis not present

## 2020-01-10 DIAGNOSIS — R69 Illness, unspecified: Secondary | ICD-10-CM | POA: Diagnosis not present

## 2020-01-10 DIAGNOSIS — E7800 Pure hypercholesterolemia, unspecified: Secondary | ICD-10-CM | POA: Diagnosis not present

## 2020-01-10 DIAGNOSIS — I25810 Atherosclerosis of coronary artery bypass graft(s) without angina pectoris: Secondary | ICD-10-CM | POA: Diagnosis not present

## 2020-01-10 LAB — CBC WITH DIFFERENTIAL/PLATELET
Basophils Absolute: 0.1 10*3/uL (ref 0.0–0.1)
Basophils Relative: 1.7 % (ref 0.0–3.0)
Eosinophils Absolute: 0.3 10*3/uL (ref 0.0–0.7)
Eosinophils Relative: 3.4 % (ref 0.0–5.0)
HCT: 46.9 % (ref 39.0–52.0)
Hemoglobin: 16.2 g/dL (ref 13.0–17.0)
Lymphocytes Relative: 27.3 % (ref 12.0–46.0)
Lymphs Abs: 2.2 10*3/uL (ref 0.7–4.0)
MCHC: 34.6 g/dL (ref 30.0–36.0)
MCV: 94.1 fl (ref 78.0–100.0)
Monocytes Absolute: 1 10*3/uL (ref 0.1–1.0)
Monocytes Relative: 13.2 % — ABNORMAL HIGH (ref 3.0–12.0)
Neutro Abs: 4.3 10*3/uL (ref 1.4–7.7)
Neutrophils Relative %: 54.4 % (ref 43.0–77.0)
Platelets: 142 10*3/uL — ABNORMAL LOW (ref 150.0–400.0)
RBC: 4.99 Mil/uL (ref 4.22–5.81)
RDW: 13.6 % (ref 11.5–15.5)
WBC: 7.9 10*3/uL (ref 4.0–10.5)

## 2020-01-10 LAB — LIPID PANEL
Cholesterol: 119 mg/dL (ref 0–200)
HDL: 28.9 mg/dL — ABNORMAL LOW (ref >39.00)
LDL Cholesterol: 72 mg/dL (ref 0–99)
NonHDL: 90.31
Total CHOL/HDL Ratio: 4
Triglycerides: 90 mg/dL (ref 0.0–149.0)
VLDL: 18 mg/dL (ref 0.0–40.0)

## 2020-01-10 LAB — COMPREHENSIVE METABOLIC PANEL
ALT: 25 U/L (ref 0–53)
AST: 24 U/L (ref 0–37)
Albumin: 3.9 g/dL (ref 3.5–5.2)
Alkaline Phosphatase: 79 U/L (ref 39–117)
BUN: 25 mg/dL — ABNORMAL HIGH (ref 6–23)
CO2: 27 mEq/L (ref 19–32)
Calcium: 9.4 mg/dL (ref 8.4–10.5)
Chloride: 103 mEq/L (ref 96–112)
Creatinine, Ser: 1.4 mg/dL (ref 0.40–1.50)
GFR: 49.73 mL/min — ABNORMAL LOW (ref >60.00)
Glucose, Bld: 93 mg/dL (ref 70–99)
Potassium: 3.8 mEq/L (ref 3.5–5.1)
Sodium: 138 mEq/L (ref 135–145)
Total Bilirubin: 1.4 mg/dL — ABNORMAL HIGH (ref 0.2–1.2)
Total Protein: 6.1 g/dL (ref 6.0–8.3)

## 2020-01-10 LAB — TSH: TSH: 2.57 u[IU]/mL (ref 0.35–4.50)

## 2020-01-10 LAB — PSA: PSA: 1.3 ng/mL (ref 0.10–4.00)

## 2020-01-10 MED ORDER — BUPROPION HCL ER (XL) 150 MG PO TB24: 150.0000 mg | ORAL_TABLET | Freq: Every day | ORAL | 3 refills | Status: DC

## 2020-01-10 MED ORDER — CLOPIDOGREL BISULFATE 75 MG PO TABS: 75.0000 mg | ORAL_TABLET | Freq: Every day | ORAL | 3 refills | Status: DC

## 2020-01-10 MED ORDER — ATORVASTATIN CALCIUM 80 MG PO TABS: ORAL_TABLET | ORAL | 3 refills | Status: DC

## 2020-01-10 MED ORDER — TRAZODONE HCL 50 MG PO TABS: 50.0000 mg | ORAL_TABLET | Freq: Every evening | ORAL | 1 refills | Status: DC | PRN

## 2020-01-10 MED ORDER — LISINOPRIL-HYDROCHLOROTHIAZIDE 20-12.5 MG PO TABS: 1.0000 | ORAL_TABLET | Freq: Every day | ORAL | 3 refills | Status: DC

## 2020-01-10 MED ORDER — METOPROLOL TARTRATE 25 MG PO TABS: 25.0000 mg | ORAL_TABLET | Freq: Two times a day (BID) | ORAL | 3 refills | Status: DC

## 2020-01-10 NOTE — Progress Notes (Signed)
Subjective:    Patient ID: Dave Robles, male    DOB: 1946/07/15, 74 y.o.   MRN: [id number]  HPI Patient presents for yearly preventative medicine examination. He is a pleasant 74 year old male who  has a past medical history of AAA (abdominal aortic aneurysm) ([place]), Adjustment disorder with depressed mood (03/04/2009), Arthritis, Colon polyps, CORONARY ARTERY DISEASE (07/10/2007), Depression, ERECTILE DYSFUNCTION (07/10/2007), Extrinsic asthma, unspecified (09/10/2008), HYPERLIPIDEMIA (07/10/2007), HYPERTENSION (07/10/2007), Myocardial infarction ([place]) (2016), and TOBACCO USE (12/15/2009).  CAD/Hyperlipidemia -CABG in 1999 1.  Currently prescribed Lipitor 80 mg, aspirin 81 mg, and Plavix 75 mg daily.  He denies algia, fatigue, shortness of breath, or chest pain. He is followed by Cardiology on a daily basis.  Lab Results  Component Value Date   CHOL 128 10/10/2018   HDL 30.70 (L) 10/10/2018   LDLCALC 67 10/10/2018   LDLDIRECT 66.7 11/30/2011   TRIG 151.0 (H) 10/10/2018   CHOLHDL 4 10/10/2018    Essential Hypertension -takes lisinopril/hydrochlorothiazide 20-12.5 mg and metoprolol 25 mg twice daily.  He denies dizziness, lightheadedness, shortness, headaches, or syncopal episodes BP Readings from Last 3 Encounters:  01/10/20 122/72  08/16/19 130/70  07/30/19 140/80   Depression - Well controlled with Wellbutrin 150 mg BID   Insomnia - controlled with Trazodone 50 mg daily.   AAA -last study in December 2000 which showed no interval enlargement, aneurysm continues to be 3.4 cm.  Yearly vascular ultrasound is recommended.  All immunizations and health maintenance protocols were reviewed with the patient and needed orders were placed. Due for Tdap but insurance will not pay for it.   Appropriate screening laboratory values were ordered for the patient including screening of hyperlipidemia, renal function and hepatic function. If indicated by BPH, a PSA was ordered.  Medication  reconciliation,  past medical history, social history, problem list and allergies were reviewed in detail with the patient  Goals were established with regard to weight loss, exercise, and  diet in compliance with medications. He is riding his bike about 100 miles per week  Wt Readings from Last 3 Encounters:  01/10/20 240 lb (108.9 kg)  08/16/19 236 lb (107 kg)  07/30/19 240 lb 1.9 oz (108.9 kg)    He is up-to-date on routine screening items such as colonoscopy, dental, and vision exams.   Review of Systems  Constitutional: Negative.   HENT: Negative.   Eyes: Negative.   Respiratory: Negative.   Cardiovascular: Negative.   Gastrointestinal: Negative.   Endocrine: Negative.   Genitourinary: Negative.   Musculoskeletal: Negative.   Skin: Negative.   Allergic/Immunologic: Negative.   Neurological: Negative.   Hematological: Negative.   Psychiatric/Behavioral: Negative.   All other systems reviewed and are negative.  Past Medical History:  Diagnosis Date  . AAA (abdominal aortic aneurysm) ([place])   . Adjustment disorder with depressed mood 03/04/2009  . Arthritis   . Colon polyps   . CORONARY ARTERY DISEASE 07/10/2007  . Depression   . ERECTILE DYSFUNCTION 07/10/2007  . Extrinsic asthma, unspecified 09/10/2008  . HYPERLIPIDEMIA 07/10/2007  . HYPERTENSION 07/10/2007  . Myocardial infarction [facility]) 2016   when had angioplasty- was told had a silent MI  . TOBACCO USE 12/15/2009    Social History   Socioeconomic History  . Marital status: Married    Spouse name: Not on file  . Number of children: Not on file  . Years of education: Not on file  . Highest education level: Not on file  Occupational History  .  Not on file  Tobacco Use  . Smoking status: Former Smoker    Packs/day: 0.50    Quit date: 01/13/2010    Years since quitting: 9.9  . Smokeless tobacco: Never Used  Substance and Sexual Activity  . Alcohol use: Yes    Alcohol/week: 5.0 standard drinks    Types: 5  Cans of beer per week  . Drug use: No  . Sexual activity: Yes    Comment: regular exercise - yes  Other Topics Concern  . Not on file  Social History Narrative   Retired from being an Financial controller of a small business    Married    No kids       He likes to play golf and rides bike       Social Determinants of Radio broadcast assistant Strain:   . Difficulty of Paying Living Expenses: Not on file  Food Insecurity:   . Worried About Charity fundraiser in the Last Year: Not on file  . Ran Out of Food in the Last Year: Not on file  Transportation Needs:   . Lack of Transportation (Medical): Not on file  . Lack of Transportation (Non-Medical): Not on file  Physical Activity:   . Days of Exercise per Week: Not on file  . Minutes of Exercise per Session: Not on file  Stress:   . Feeling of Stress : Not on file  Social Connections:   . Frequency of Communication with Friends and Family: Not on file  . Frequency of Social Gatherings with Friends and Family: Not on file  . Attends Religious Services: Not on file  . Active Member of Clubs or Organizations: Not on file  . Attends Archivist Meetings: Not on file  . Marital Status: Not on file  Intimate Partner Violence:   . Fear of Current or Ex-Partner: Not on file  . Emotionally Abused: Not on file  . Physically Abused: Not on file  . Sexually Abused: Not on file    Past Surgical History:  Procedure Laterality Date  . COLONOSCOPY    . CORONARY ANGIOPLASTY  12/2014  . CORONARY ARTERY BYPASS GRAFT  11/15/89   5 vessels  . HYDROCELE EXCISION / REPAIR  2011  . INGUINAL HERNIA REPAIR Left 1985  . TOTAL HIP ARTHROPLASTY Right 04/19/2018   Procedure: RIGHT TOTAL HIP ARTHROPLASTY ANTERIOR APPROACH;  Surgeon: Gaynelle Arabian, MD;  Location: [facility];  Service: Orthopedics;  Laterality: Right;    Family History  Problem Relation Age of Onset  . Coronary artery disease Father        cabg  . Cancer Mother        Breast and or  lung   . Depression Mother   . Hyperlipidemia Other   . Hypertension Other   . Depression Paternal Grandfather   . Colon cancer Neg Hx     No Known Allergies  Current Outpatient Medications on File Prior to Visit  Medication Sig Dispense Refill  . acetaminophen (TYLENOL) 325 MG tablet Take 650 mg by mouth every 6 (six) hours as needed for moderate pain.    Marland Kitchen atorvastatin (LIPITOR) 80 MG tablet TAKE 1 TABLET DAILY 90 tablet 0  . clopidogrel (PLAVIX) 75 MG tablet Take 1 tablet (75 mg total) by mouth daily. 90 tablet 0  . lisinopril-hydrochlorothiazide (ZESTORETIC) 20-12.5 MG tablet Take 1 tablet by mouth daily. 90 tablet 0  . metoprolol tartrate (LOPRESSOR) 25 MG tablet Take 1 tablet (25 mg  total) by mouth 2 (two) times daily. 180 tablet 0  . Multiple Vitamin (MULTIVITAMIN) capsule Take 1 capsule by mouth daily.      . naproxen sodium (ALEVE) 220 MG tablet Take 220 mg by mouth daily as needed.    . nitroGLYCERIN (NITROSTAT) 0.4 MG SL tablet Place 1 tablet (0.4 mg total) under the tongue every 5 (five) minutes as needed. 25 tablet 0  . sildenafil (VIAGRA) 100 MG tablet Take 100 mg by mouth daily as needed for erectile dysfunction.    Marland Kitchen amoxicillin (AMOXIL) 500 MG capsule Use as directed    . buPROPion (WELLBUTRIN XL) 150 MG 24 hr tablet Take 1 tablet (150 mg total) by mouth daily. 90 tablet 0  . traZODone (DESYREL) 100 MG tablet Take 1 tablet (100 mg total) by mouth at bedtime as needed for sleep. 90 tablet 1   No current facility-administered medications on file prior to visit.    BP 122/72   Temp 97.8 F (36.6 C)   Ht 5' 9.75" (1.772 m)   Wt 240 lb (108.9 kg)   BMI 34.68 kg/m       Objective:   Physical Exam Vitals and nursing note reviewed.  Constitutional:      General: He is not in acute distress.    Appearance: Normal appearance. He is well-developed and normal weight.  HENT:     Head: Normocephalic and atraumatic.     Right Ear: Tympanic membrane, ear canal and  external ear normal. There is no impacted cerumen.     Left Ear: Tympanic membrane, ear canal and external ear normal. There is no impacted cerumen.     Nose: Nose normal. No congestion or rhinorrhea.     Mouth/Throat:     Mouth: Mucous membranes are moist.     Pharynx: Oropharynx is clear. No oropharyngeal exudate or posterior oropharyngeal erythema.  Eyes:     General:        Right eye: No discharge.        Left eye: No discharge.     Extraocular Movements: Extraocular movements intact.     Conjunctiva/sclera: Conjunctivae normal.     Pupils: Pupils are equal, round, and reactive to light.  Neck:     Vascular: No carotid bruit.     Trachea: No tracheal deviation.  Cardiovascular:     Rate and Rhythm: Normal rate and regular rhythm.     Pulses: Normal pulses.     Heart sounds: Normal heart sounds. No murmur. No friction rub. No gallop.   Pulmonary:     Effort: Pulmonary effort is normal. No respiratory distress.     Breath sounds: Normal breath sounds. No stridor. No wheezing, rhonchi or rales.  Chest:     Chest wall: No tenderness.  Abdominal:     General: Bowel sounds are normal. There is no distension.     Palpations: Abdomen is soft. There is no mass.     Tenderness: There is no abdominal tenderness. There is no right CVA tenderness, left CVA tenderness, guarding or rebound.     Hernia: No hernia is present.  Musculoskeletal:        General: No swelling, tenderness, deformity or signs of injury. Normal range of motion.     Right lower leg: No edema.     Left lower leg: No edema.  Lymphadenopathy:     Cervical: No cervical adenopathy.  Skin:    General: Skin is warm and dry.     Capillary Refill:  Capillary refill takes less than 2 seconds.     Coloration: Skin is not jaundiced or pale.     Findings: No bruising, erythema, lesion or rash.  Neurological:     General: No focal deficit present.     Mental Status: He is alert and oriented to person, place, and time.      Cranial Nerves: No cranial nerve deficit.     Sensory: No sensory deficit.     Motor: No weakness.     Coordination: Coordination normal.     Gait: Gait normal.     Deep Tendon Reflexes: Reflexes normal.  Psychiatric:        Mood and Affect: Mood normal.        Behavior: Behavior normal.        Thought Content: Thought content normal.        Judgment: Judgment normal.       Assessment & Plan:  1. Routine general medical examination at a health care facility - Continue to eat a heart healthy diet and exercise - Follow up in one year or sooner if needed - CBC with Differential/Platelet - Comprehensive metabolic panel - Lipid panel - TSH  2. Essential hypertension - Well controlled.  - No change in medication - CBC with Differential/Platelet - Comprehensive metabolic panel - Lipid panel - TSH - lisinopril-hydrochlorothiazide (ZESTORETIC) 20-12.5 MG tablet; Take 1 tablet by mouth daily.  Dispense: 90 tablet; Refill: 3  3. Atherosclerosis of other coronary artery bypass graft without angina pectoris  - CBC with Differential/Platelet - Comprehensive metabolic panel - Lipid panel - TSH  4. Pure hypercholesterolemia - Consider adding fenofibrate  - CBC with Differential/Platelet - Comprehensive metabolic panel - Lipid panel - TSH - atorvastatin (LIPITOR) 80 MG tablet; TAKE 1 TABLET DAILY  Dispense: 90 tablet; Refill: 3  5. Depression, recurrent ([place]) - Continue with Wellbutrin  - CBC with Differential/Platelet - Comprehensive metabolic panel - Lipid panel - TSH  6. Primary insomnia - traZODone (DESYREL) 50 MG tablet; Take 1 tablet (50 mg total) by mouth at bedtime as needed for sleep.  Dispense: 90 tablet; Refill: 1 - CBC with Differential/Platelet - Comprehensive metabolic panel - Lipid panel - TSH  7. Prostate cancer screening  - PSA   Dorothyann Peng, NP

## 2020-01-11 ENCOUNTER — Ambulatory Visit: Payer: Medicare HMO | Attending: Internal Medicine

## 2020-01-11 DIAGNOSIS — Z23 Encounter for immunization: Secondary | ICD-10-CM | POA: Insufficient documentation

## 2020-01-11 DIAGNOSIS — Z96641 Presence of right artificial hip joint: Secondary | ICD-10-CM | POA: Diagnosis not present

## 2020-01-11 DIAGNOSIS — Z471 Aftercare following joint replacement surgery: Secondary | ICD-10-CM | POA: Diagnosis not present

## 2020-01-11 NOTE — Progress Notes (Signed)
   Covid-19 Vaccination Clinic  Name:  Dave Robles    MRN: [id number] DOB: [birth year]  01/11/2020  Dave Robles was observed post Covid-19 immunization for 15 minutes without incidence. He was provided with Vaccine Information Sheet and instruction to access the V-Safe system.   Dave Robles was instructed to call 911 with any severe reactions post vaccine: Marland Kitchen Difficulty breathing  . Swelling of your face and throat  . A fast heartbeat  . A bad rash all over your body  . Dizziness and weakness    Immunizations Administered    Name Date Dose VIS Date Route   Pfizer COVID-19 Vaccine 01/11/2020 12:23 PM 0.3 mL 10/26/2019 Intramuscular   Manufacturer: [employer]   Lot: [id number]   [place]: [id number]

## 2020-01-18 NOTE — Progress Notes (Addendum)
CARDIOLOGY OFFICE NOTE  Date:  01/25/2020    Dave Robles Date of Birth: 05/11/1946 Medical Record [id number]  PCP:  Dorothyann Peng, NP  Cardiologist:  Servando Snare     Chief Complaint  Patient presents with   Follow-up    History of Present Illness: Dave Robles is a 74 y.o. male who presents today for a 6 month check.  Former patient of Dr. Claris Gladden - typically follows with me.  He has a history of known CAD with remote CABG in 1991, HTN, HLD, ED, past tobacco abuse, asthma, AAA (last measured in 10/2018)and depression.He had a non-ST elevationmyocardial infarction in 2016 while vacationing in [place]. Cath noted to have NL left main, 90% prox LAD, 99% mid LAD with patent LIMA to mid LAD and patent SVG to DX. LCX origin stenosis vs flow void? Fistula vs competitive flow in branch?, 100% RCA, SVG to RCA 99% origin treated with Xience DES and PTCA to the distal.  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 Burt Knack did NOT feel any treatment is necessary - this is oxygenated blood going to oxygenated blood.  Myoview done in April 2019 for atypical shoulder pain was low risk with EF 54 and no ischemia. He has had mild LV dysfunction. He does have cardiology in [place] due to having a home there as well as here. Last seen back in September - riding about 100 miles a week on his bike. Felt to be doing well.   The patient does not have symptoms concerning for COVID-19 infection (fever, chills, cough, or new shortness of breath).   Comes in today. Here alone. Has had both COVID vaccines. Got his first one in [place] and second one here. Feeling good. No chest pain. Breathing is good. Not dizzy. Tolerating his medicines without issue. Had lab last month - those are reviewed. Riding his bike. Does feel like his legs are "not as strong" - no pain in his legs - no  claudication and rides his bike without issue.   Past Medical History:  Diagnosis Date   AAA (abdominal aortic aneurysm) ([place])    Adjustment disorder with depressed mood 03/04/2009   Arthritis    Colon polyps    CORONARY ARTERY DISEASE 07/10/2007   Depression    ERECTILE DYSFUNCTION 07/10/2007   Extrinsic asthma, unspecified 09/10/2008   HYPERLIPIDEMIA 07/10/2007   HYPERTENSION 07/10/2007   Myocardial infarction [facility]) 2016   when had angioplasty- was told had a silent MI   TOBACCO USE 12/15/2009    Past Surgical History:  Procedure Laterality Date   COLONOSCOPY     CORONARY ANGIOPLASTY  12/2014   CORONARY ARTERY BYPASS GRAFT  11/15/89   5 vessels   HYDROCELE EXCISION / REPAIR  2011   INGUINAL HERNIA REPAIR Left 1985   TOTAL HIP ARTHROPLASTY Right 04/19/2018   Procedure: RIGHT TOTAL HIP ARTHROPLASTY ANTERIOR APPROACH;  Surgeon: Gaynelle Arabian, MD;  Location: [facility];  Service: Orthopedics;  Laterality: Right;     Medications: Current Meds  Medication Sig   acetaminophen (TYLENOL) 325 MG tablet Take 650 mg by mouth every 6 (six) hours as needed for moderate pain.   atorvastatin (LIPITOR) 80 MG tablet TAKE 1 TABLET DAILY   buPROPion (WELLBUTRIN XL) 150 MG 24 hr tablet Take 1 tablet (150 mg total) by mouth daily.   clopidogrel (PLAVIX) 75 MG tablet Take 1 tablet (  75 mg total) by mouth daily.   lisinopril-hydrochlorothiazide (ZESTORETIC) 20-12.5 MG tablet Take 1 tablet by mouth daily.   metoprolol tartrate (LOPRESSOR) 25 MG tablet Take 1 tablet (25 mg total) by mouth 2 (two) times daily.   Multiple Vitamin (MULTIVITAMIN) capsule Take 1 capsule by mouth daily.     naproxen sodium (ALEVE) 220 MG tablet Take 220 mg by mouth daily as needed.   nitroGLYCERIN (NITROSTAT) 0.4 MG SL tablet Place 1 tablet (0.4 mg total) under the tongue every 5 (five) minutes as needed.   sildenafil (VIAGRA) 100 MG tablet Take 100 mg by mouth daily as needed for erectile dysfunction.     traZODone (DESYREL) 50 MG tablet Take 1 tablet (50 mg total) by mouth at bedtime as needed for sleep.     Allergies: No Known Allergies  Social History: The patient  reports that he quit smoking about 10 years ago. He smoked 0.50 packs per day. He has never used smokeless tobacco. He reports current alcohol use of about 5.0 standard drinks of alcohol per week. He reports that he does not use drugs.   Family History: The patient's family history includes Cancer in his mother; Coronary artery disease in his father; Depression in his mother and paternal grandfather; Hyperlipidemia in an other family member; Hypertension in an other family member.   Review of Systems: Please see the history of present illness.   All other systems are reviewed and negative.   Physical Exam: VS:  BP 130/74    Pulse (!) 50    Ht 5\' 10"  (1.778 m)    Wt 242 lb 1.9 oz (109.8 kg)    SpO2 96%    BMI 34.74 kg/m  .  BMI Body mass index is 34.74 kg/m.  Wt Readings from Last 3 Encounters:  01/25/20 242 lb 1.9 oz (109.8 kg)  01/10/20 240 lb (108.9 kg)  08/16/19 236 lb (107 kg)    General: Pleasant. Alert and in no acute distress.   Cardiac: Regular rate and rhythm. No murmurs, rubs, or gallops. No edema.  Respiratory:  Lungs are clear to auscultation bilaterally with normal work of breathing.  GI: Soft and nontender.  MS: No deformity or atrophy. Gait and ROM intact.  Skin: Warm and dry. Color is normal.  Neuro:  Strength and sensation are intact and no gross focal deficits noted.  Psych: Alert, appropriate and with normal affect.   LABORATORY DATA:  EKG:  EKG is ordered today. This demonstrates sinus bradycardia - HR is 53 with non specific changes - unchanged - except no PVCs noted today as was on last tracing.  Lab Results  Component Value Date   WBC 7.9 01/10/2020   HGB 16.2 01/10/2020   HCT 46.9 01/10/2020   PLT 142.0 (L) 01/10/2020   GLUCOSE 93 01/10/2020   CHOL 119 01/10/2020   TRIG 90.0  01/10/2020   HDL 28.90 (L) 01/10/2020   LDLDIRECT 66.7 11/30/2011   LDLCALC 72 01/10/2020   ALT 25 01/10/2020   AST 24 01/10/2020   NA 138 01/10/2020   K 3.8 01/10/2020   CL 103 01/10/2020   CREATININE 1.40 01/10/2020   BUN 25 (H) 01/10/2020   CO2 27 01/10/2020   TSH 2.57 01/10/2020   PSA 1.30 01/10/2020   INR 1.06 04/11/2018     BNP (last 3 results) No results for input(s): BNP in the last 8760 hours.  ProBNP (last 3 results) No results for input(s): PROBNP in the last 8760 hours.  Other Studies Reviewed Today:  AAA [place] Summary 10/2019:  Abdominal Aorta: There is evidence of abnormal dilatation of the proximal, mid and distal Abdominal aorta. The largest aortic measurement is 3.4 cm. The largest aortic diameter remains essentially unchanged compared to prior exam. Previous diameter  measurement was 3.4 cm obtained on 11/01/18.  Stenosis: +------------------+-------------+   Location      Stenosis      +------------------+-------------+   Right Common Iliac <50% stenosis   +------------------+-------------+   Left Common Iliac  <50% stenosis   +------------------+-------------+   IVC/Iliac: There is no evidence of thrombus involving the IVC.    *See table(s) above for measurements and observations.  Suggest follow up study in 12 months.    MyoviewStudy Highlights4/2019   Nuclear stress EF: 54%.  There is a small defect of mild severity present in the basal inferior location. The defect is non-reversible and consistent with diaphragmatic attenuation artifact. No ischemia noted.  The left ventricular ejection fraction is mildly decreased (45-54%).  Compared to study of 2014, fixed basal inferior defect is new.  There was no ST segment deviation noted during stress.  This is a low risk study.    Echo Study Conclusions from 03/2015  - Left ventricle: The cavity size was normal. Systolic function was normal. The estimated ejection fraction was  in the range of 55% to 60%. Wall motion was normal; there were no regional wall motion abnormalities. Left ventricular diastolic function parameters were normal. - Left atrium: The atrium was mildly dilated. - Atrial septum: No defect or patent foramen ovale was identified    Assessment/Plan:  1. HTN - BP is fine - no changes made with his medicines today.   2. CAD - remote CABG - had NSTEMI in 2016 with DES to SVG to RCA - low risk Myoview from 2019 - never has had actual chest pain syndrome - remains on DAPT - doing well clinically. Good exercise tolerance.   3. HLD - on statin - labs from last month noted.   4. Decreased strength in legs - he has 2+ pedal pulses - doubt this is circulation issue.   5. AAA - had his study from December 2020 noted.   6. Prior LV dysfunction - normal EF by echo in 2016 - remains on ACE and beta blocker.   7. Obesity  8. Resting bradycardia - not symptomatic - remains on low dose beta blocker - will continue.   9. COVID-19 Education: The signs and symptoms of COVID-19 were discussed with the patient and how to seek care for testing (follow up with PCP or arrange E-visit).  The importance of social distancing, staying at home, hand hygiene and wearing a mask when out in public were discussed today.  Current medicines are reviewed with the patient today.  The patient does not have concerns regarding medicines other than what has been noted above.  The following changes have been made:  See above.  Labs/ tests ordered today include:    Orders Placed This Encounter  Procedures   EKG 12-Lead     Disposition:   FU with me in 6 months.   Patient is agreeable to this plan and will call if any problems develop in the interim.   SignedTruitt Merle, NP  01/25/2020 9:13 AM  [facility] [address] [place], [place]  [address] Phone: [phone] Fax: [phone]

## 2020-01-21 ENCOUNTER — Ambulatory Visit: Payer: Medicare HMO | Admitting: Nurse Practitioner

## 2020-01-25 ENCOUNTER — Ambulatory Visit: Payer: Medicare HMO | Admitting: Nurse Practitioner

## 2020-01-25 ENCOUNTER — Other Ambulatory Visit: Payer: Self-pay

## 2020-01-25 ENCOUNTER — Encounter: Payer: Self-pay | Admitting: Nurse Practitioner

## 2020-01-25 VITALS — BP 130/74 | HR 50 | Ht 70.0 in | Wt 242.1 lb

## 2020-01-25 DIAGNOSIS — Z7189 Other specified counseling: Secondary | ICD-10-CM | POA: Diagnosis not present

## 2020-01-25 DIAGNOSIS — I714 Abdominal aortic aneurysm, without rupture, unspecified: Secondary | ICD-10-CM

## 2020-01-25 DIAGNOSIS — E7800 Pure hypercholesterolemia, unspecified: Secondary | ICD-10-CM | POA: Diagnosis not present

## 2020-01-25 DIAGNOSIS — I2510 Atherosclerotic heart disease of native coronary artery without angina pectoris: Secondary | ICD-10-CM

## 2020-01-25 DIAGNOSIS — I10 Essential (primary) hypertension: Secondary | ICD-10-CM | POA: Diagnosis not present

## 2020-01-25 NOTE — Addendum Note (Signed)
Addended by: [name] on: 01/25/2020 09:18 AM   Modules accepted: Level of Service

## 2020-01-25 NOTE — Patient Instructions (Addendum)

## 2020-01-28 DIAGNOSIS — H43812 Vitreous degeneration, left eye: Secondary | ICD-10-CM | POA: Diagnosis not present

## 2020-01-28 DIAGNOSIS — H40053 Ocular hypertension, bilateral: Secondary | ICD-10-CM | POA: Diagnosis not present

## 2020-01-28 DIAGNOSIS — H35033 Hypertensive retinopathy, bilateral: Secondary | ICD-10-CM | POA: Diagnosis not present

## 2020-01-28 DIAGNOSIS — H40023 Open angle with borderline findings, high risk, bilateral: Secondary | ICD-10-CM | POA: Diagnosis not present

## 2020-02-04 ENCOUNTER — Ambulatory Visit: Payer: Medicare HMO | Admitting: Nurse Practitioner

## 2020-02-19 DIAGNOSIS — L989 Disorder of the skin and subcutaneous tissue, unspecified: Secondary | ICD-10-CM | POA: Diagnosis not present

## 2020-02-19 DIAGNOSIS — L570 Actinic keratosis: Secondary | ICD-10-CM | POA: Diagnosis not present

## 2020-02-19 DIAGNOSIS — L309 Dermatitis, unspecified: Secondary | ICD-10-CM | POA: Diagnosis not present

## 2020-02-19 DIAGNOSIS — L578 Other skin changes due to chronic exposure to nonionizing radiation: Secondary | ICD-10-CM | POA: Diagnosis not present

## 2020-02-29 DIAGNOSIS — M7061 Trochanteric bursitis, right hip: Secondary | ICD-10-CM | POA: Diagnosis not present

## 2020-02-29 DIAGNOSIS — Z96641 Presence of right artificial hip joint: Secondary | ICD-10-CM | POA: Diagnosis not present

## 2020-04-15 ENCOUNTER — Encounter: Payer: Self-pay | Admitting: Adult Health

## 2020-04-29 ENCOUNTER — Encounter: Payer: Self-pay | Admitting: Adult Health

## 2020-04-29 ENCOUNTER — Other Ambulatory Visit: Payer: Self-pay

## 2020-04-29 ENCOUNTER — Telehealth (INDEPENDENT_AMBULATORY_CARE_PROVIDER_SITE_OTHER): Payer: Medicare HMO | Admitting: Adult Health

## 2020-04-29 VITALS — Wt 242.0 lb

## 2020-04-29 DIAGNOSIS — R69 Illness, unspecified: Secondary | ICD-10-CM | POA: Diagnosis not present

## 2020-04-29 DIAGNOSIS — R0600 Dyspnea, unspecified: Secondary | ICD-10-CM | POA: Diagnosis not present

## 2020-04-29 DIAGNOSIS — F339 Major depressive disorder, recurrent, unspecified: Secondary | ICD-10-CM | POA: Diagnosis not present

## 2020-04-29 DIAGNOSIS — R0609 Other forms of dyspnea: Secondary | ICD-10-CM

## 2020-04-29 NOTE — Progress Notes (Signed)
Virtual Visit via Telephone Note  I connected with Dave Robles on 04/29/20 at  2:00 PM EDT by telephone and verified that I am speaking with the correct person using two identifiers.   I discussed the limitations, risks, security and privacy concerns of performing an evaluation and management service by telephone and the availability of in person appointments. I also discussed with the patient that there may be a patient responsible charge related to this service. The patient expressed understanding and agreed to proceed.  Location patient: home Location provider: work or home office Participants present for the call: patient, provider Patient did not have a visit in the prior 7 days to address this/these issue(s).   History of Present Illness: 74 year old male who  has a past medical history of AAA (abdominal aortic aneurysm) ([place]), Adjustment disorder with depressed mood (03/04/2009), Arthritis, Colon polyps, CORONARY ARTERY DISEASE (07/10/2007), Depression, ERECTILE DYSFUNCTION (07/10/2007), Extrinsic asthma, unspecified (09/10/2008), HYPERLIPIDEMIA (07/10/2007), HYPERTENSION (07/10/2007), Myocardial infarction ([place]) (2016), and TOBACCO USE (12/15/2009).  He presents to the office today for concern of depression. He is currently prescribed Wellbutrin 150 mg ER. When I last saw him in February he reported that he was feeling well controlled. Over the last few months he reports that he is feeling more depressed and like to cough there is a medication that he can be switched to.  In the past we tried increasing his Wellbutrin to 300 mg but he had issues with insomnia.  He is currently on trazodone which helps him sleep.  He is okay with increasing his Wellbutrin to 300 mg before trialing something else   He also complains of shortness of breath with exertion.  He reports the evening riding a bike up a hill that he could do so in the past without difficulty causes him to have shortness of breath.  He  denies chest pain but does report mild perspiration at night and dry cough.  He denies fevers, chills, or shortness of breath with rest.  He did spend some time in [place] during the winter and is concerned that he might have "valley fever". Symptoms have been present for 3-4 weeks.    Observations/Objective: Patient sounds cheerful and well on the phone. I do not appreciate any SOB. Speech and thought processing are grossly intact. Patient reported vitals:  Assessment and Plan: 1. Depression, recurrent ([place]) - Will increase Wellbutrin to 300 mg XR and have him follow up in 4 weeks. Since he just got a new script for Wellbutrin 150mg  XR, he will take two tabs.   2. DOE (dyspnea on exertion) - Advised follow up with his cardiologist as well. Will test him for Coccidioides antibody and get Chest xray  - DG Chest 2 View; Future - Coccidioides Antibodies; Future - CBC with Differential/Platelet; Future   Follow Up Instructions:    I did not refer this patient for an OV in the next 24 hours for this/these issue(s).  I discussed the assessment and treatment plan with the patient. The patient was provided an opportunity to ask questions and all were answered. The patient agreed with the plan and demonstrated an understanding of the instructions.   The patient was advised to call back or seek an in-person evaluation if the symptoms worsen or if the condition fails to improve as anticipated.  I provided 30 minutes of non-face-to-face time during this encounter.   Dorothyann Peng, NP

## 2020-04-30 ENCOUNTER — Other Ambulatory Visit (INDEPENDENT_AMBULATORY_CARE_PROVIDER_SITE_OTHER): Payer: Medicare HMO

## 2020-04-30 ENCOUNTER — Telehealth: Payer: Self-pay | Admitting: Nurse Practitioner

## 2020-04-30 ENCOUNTER — Ambulatory Visit (INDEPENDENT_AMBULATORY_CARE_PROVIDER_SITE_OTHER)
Admission: RE | Admit: 2020-04-30 | Discharge: 2020-04-30 | Disposition: A | Discharge status: Home / Self Care | Payer: Medicare HMO | Source: Ambulatory Visit | Attending: Adult Health | Admitting: Adult Health

## 2020-04-30 DIAGNOSIS — R0600 Dyspnea, unspecified: Secondary | ICD-10-CM

## 2020-04-30 DIAGNOSIS — R05 Cough: Secondary | ICD-10-CM | POA: Diagnosis not present

## 2020-04-30 DIAGNOSIS — R0609 Other forms of dyspnea: Secondary | ICD-10-CM

## 2020-04-30 DIAGNOSIS — J90 Pleural effusion, not elsewhere classified: Secondary | ICD-10-CM | POA: Diagnosis not present

## 2020-04-30 DIAGNOSIS — R0602 Shortness of breath: Secondary | ICD-10-CM | POA: Diagnosis not present

## 2020-04-30 LAB — CBC WITH DIFFERENTIAL/PLATELET
Basophils Absolute: 0.1 10*3/uL (ref 0.0–0.1)
Basophils Relative: 1.3 % (ref 0.0–3.0)
Eosinophils Absolute: 0.3 10*3/uL (ref 0.0–0.7)
Eosinophils Relative: 3.3 % (ref 0.0–5.0)
HCT: 46.4 % (ref 39.0–52.0)
Hemoglobin: 16.2 g/dL (ref 13.0–17.0)
Lymphocytes Relative: 21 % (ref 12.0–46.0)
Lymphs Abs: 1.7 10*3/uL (ref 0.7–4.0)
MCHC: 34.9 g/dL (ref 30.0–36.0)
MCV: 94.6 fl (ref 78.0–100.0)
Monocytes Absolute: 0.8 10*3/uL (ref 0.1–1.0)
Monocytes Relative: 9.3 % (ref 3.0–12.0)
Neutro Abs: 5.4 10*3/uL (ref 1.4–7.7)
Neutrophils Relative %: 65.1 % (ref 43.0–77.0)
Platelets: 151 10*3/uL (ref 150.0–400.0)
RBC: 4.91 Mil/uL (ref 4.22–5.81)
RDW: 13.9 % (ref 11.5–15.5)
WBC: 8.3 10*3/uL (ref 4.0–10.5)

## 2020-04-30 NOTE — Telephone Encounter (Signed)
Patient requested a sooner appointment through Pt Schedule. He is scheduled 06/11/2020. He stated he was having:  Shortness of breath when exercising.  Similar to symptoms before MI in late 2015

## 2020-04-30 NOTE — H&P (View-Only) (Signed)
CARDIOLOGY OFFICE NOTE  Date:  05/06/2020    Dave Robles Date of Birth: [birth year] Medical Record #[id number]  PCP:  Dorothyann Peng, NP  Cardiologist:  Annabell Howells    Chief Complaint  Patient presents with  . Shortness of Breath    History of Present Illness: Dave Robles is a 74 y.o. male who presents today for a work in visit. Former patient of Dr. Claris Gladden - typically follows with me.  He has a history of known CAD with remoteCABG in 1991 per Dr. Kateri Plummer, [place], ED, pasttobacco abuse, asthma, AAA (last measured in 10/2019 - 3.4 cm)and depression.He had a non-ST elevationmyocardial infarction in 2016 while vacationing in [place].Cathnoted to have NL left main, 90% prox LAD, 99% mid LAD with patent LIMA to mid LAD and patent SVG to DX. LCX origin stenosis vs flow void? Fistula vs competitive flow in branch?, 100% RCA, SVG to RCA 99% origin treated with Xience DES and PTCA to the distal.  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 Burt Knack did NOT feel any treatment is necessary - this is oxygenated blood going to oxygenated blood.  Myoviewdonein April 2019 for atypical shoulder pain(no DOE at that time) was low risk with EF 54 and no ischemia.He has had mild LV dysfunction. He does have cardiology arranged in [place] due to having a home there as well as here. When seen here in March - he was doing well.   Called last week with new onset of shortness of breath - thus added on for today.   The patient does not have symptoms concerning for COVID-19 infection (fever, chills, cough, or new shortness of breath).   Comes in today. Here alone. He has been fully vaccinated. He notes he is now short of breath. Identical to his issues 5 years ago while he was in [place] - ended up with NSTEMI and a stent at that time. Feels like he has to work harder  than needs to when riding his bike. Over last 4 to 6 weeks - going up the inclines - he now has to stop and catch his breath due to being short of breath. This has happened with just walking as well.  No chest pain - has never had any chest pain syndrome - shortness of breath has always been his anginal equivalent. Tells me several times how this current situation is like when he had his NSTEMI in 2016 while in [place]. He is concerned that something "is going on in there". He has had CXR and blood drawn to make sure he did not have "Valley Fever" - does not have. CXR was ok. Lab was ok. No NTG use. His grafts are now 73 years old. He has been working on his weight.   Past Medical History:  Diagnosis Date  . AAA (abdominal aortic aneurysm) ([place])   . Adjustment disorder with depressed mood 03/04/2009  . Arthritis   . Colon polyps   . CORONARY ARTERY DISEASE 07/10/2007  . Depression   . ERECTILE DYSFUNCTION 07/10/2007  . Extrinsic asthma, unspecified 09/10/2008  . HYPERLIPIDEMIA 07/10/2007  . HYPERTENSION 07/10/2007  . Myocardial infarction [facility]) 2016   when had angioplasty- was told had a silent MI  . TOBACCO USE 12/15/2009    Past Surgical History:  Procedure Laterality Date  . COLONOSCOPY    . CORONARY ANGIOPLASTY  12/2014  .  CORONARY ARTERY BYPASS GRAFT  11/15/89   5 vessels  . HYDROCELE EXCISION / REPAIR  2011  . INGUINAL HERNIA REPAIR Left 1985  . TOTAL HIP ARTHROPLASTY Right 04/19/2018   Procedure: RIGHT TOTAL HIP ARTHROPLASTY ANTERIOR APPROACH;  Surgeon: Gaynelle Arabian, MD;  Location: [facility];  Service: Orthopedics;  Laterality: Right;     Medications: Current Meds  Medication Sig  . acetaminophen (TYLENOL) 325 MG tablet Take 650 mg by mouth every 6 (six) hours as needed for moderate pain.  Marland Kitchen atorvastatin (LIPITOR) 80 MG tablet TAKE 1 TABLET DAILY  . clopidogrel (PLAVIX) 75 MG tablet Take 1 tablet (75 mg total) by mouth daily.  Marland Kitchen lisinopril-hydrochlorothiazide (ZESTORETIC) 20-12.5 MG  tablet Take 1 tablet by mouth daily.  . metoprolol tartrate (LOPRESSOR) 25 MG tablet Take 1 tablet (25 mg total) by mouth 2 (two) times daily.  . Multiple Vitamin (MULTIVITAMIN) capsule Take 1 capsule by mouth daily.    . naproxen sodium (ALEVE) 220 MG tablet Take 220 mg by mouth daily as needed.  . nitroGLYCERIN (NITROSTAT) 0.4 MG SL tablet Place 1 tablet (0.4 mg total) under the tongue every 5 (five) minutes as needed.  . sildenafil (VIAGRA) 100 MG tablet Take 100 mg by mouth daily as needed for erectile dysfunction.     Allergies: No Known Allergies  Social History: The patient  reports that he quit smoking about 10 years ago. He smoked 0.50 packs per day. He has never used smokeless tobacco. He reports current alcohol use of about 5.0 standard drinks of alcohol per week. He reports that he does not use drugs.   Family History: The patient's family history includes Cancer in his mother; Coronary artery disease in his father; Depression in his mother and paternal grandfather; Hyperlipidemia in an other family member; Hypertension in an other family member.   Review of Systems: Please see the history of present illness.   All other systems are reviewed and negative.   Physical Exam: VS:  BP 130/70   Pulse (!) 57   Ht 5\' 10"  (1.778 m)   Wt 235 lb 12.8 oz (107 kg)   SpO2 96%   BMI 33.83 kg/m  .  BMI Body mass index is 33.83 kg/m.  Wt Readings from Last 3 Encounters:  05/06/20 235 lb 12.8 oz (107 kg)  04/29/20 242 lb (109.8 kg)  01/25/20 242 lb 1.9 oz (109.8 kg)    General: Pleasant. Well developed, well nourished and in no acute distress.  His weight is down 7 pounds. Remains obese.  HEENT: Normal.  Neck: Supple, no JVD, carotid bruits, or masses noted.  Cardiac: Regular rate and rhythm. No murmurs, rubs, or gallops. No edema.  Respiratory:  Lungs are clear to auscultation bilaterally with normal work of breathing.  GI: Soft and nontender.  MS: No deformity or atrophy. Gait  and ROM intact.  Skin: Warm and dry. Color is normal.  Neuro:  Strength and sensation are intact and no gross focal deficits noted.  Psych: Alert, appropriate and with normal affect.   LABORATORY DATA:  EKG:  EKG is ordered today.  Personally reviewed by me. This demonstrates sinus brady with inferior T wave changes - HR is 57.  Lab Results  Component Value Date   WBC 8.3 04/30/2020   HGB 16.2 04/30/2020   HCT 46.4 04/30/2020   PLT 151.0 04/30/2020   GLUCOSE 93 01/10/2020   CHOL 119 01/10/2020   TRIG 90.0 01/10/2020   HDL 28.90 (L) 01/10/2020  LDLDIRECT 66.7 11/30/2011   LDLCALC 72 01/10/2020   ALT 25 01/10/2020   AST 24 01/10/2020   NA 138 01/10/2020   K 3.8 01/10/2020   CL 103 01/10/2020   CREATININE 1.40 01/10/2020   BUN 25 (H) 01/10/2020   CO2 27 01/10/2020   TSH 2.57 01/10/2020   PSA 1.30 01/10/2020   INR 1.06 04/11/2018     BNP (last 3 results) No results for input(s): BNP in the last 8760 hours.  ProBNP (last 3 results) No results for input(s): PROBNP in the last 8760 hours.   Other Studies Reviewed Today:  AAA [place] Summary 10/2019:  Abdominal Aorta: There is evidence of abnormal dilatation of the proximal, mid and distal Abdominal aorta. The largest aortic measurement is 3.4 cm. The largest aortic diameter remains essentially unchanged compared to prior exam. Previous diameter  measurement was 3.4 cm obtained on 11/01/18.  Stenosis: +------------------+-------------+  Location     Stenosis     +------------------+-------------+  Right Common Iliac<50% stenosis  +------------------+-------------+  Left Common Iliac <50% stenosis  +------------------+-------------+   IVC/Iliac: There is no evidence of thrombus involving the IVC.    *See table(s) above for measurements and observations.  Suggest follow up study in 12 months.    MyoviewStudy Highlights4/2019   Nuclear stress EF: 54%.  There is a small defect of mild  severity present in the basal inferior location. The defect is non-reversible and consistent with diaphragmatic attenuation artifact. No ischemia noted.  The left ventricular ejection fraction is mildly decreased (45-54%).  Compared to study of 2014, fixed basal inferior defect is new.  There was no ST segment deviation noted during stress.  This is a low risk study.    Echo Study Conclusions from 03/2015  - Left ventricle: The cavity size was normal. Systolic function was normal. The estimated ejection fraction was in the range of 55% to 60%. Wall motion was normal; there were no regional wall motion abnormalities. Left ventricular diastolic function parameters were normal. - Left atrium: The atrium was mildly dilated. - Atrial septum: No defect or patent foramen ovale was identified    Assessment/Plan:  1. Recurrent DOE - his anginal equivalent - we discussed options - given that his bypass grafts are now 73 years old - he had DES to SVG back in RCA from 2016 while in [place] -  will proceed with repeat cath - The patient understands that risks include but are not limited to stroke (1 in 1000), death (1 in 1000), kidney failure [usually temporary] (1 in 500), bleeding (1 in 200), allergic reaction [possibly serious] (1 in 200), and agrees to proceed. Arranged for next week with Dr. Tamala Julian. He is to limit his activities in the meantime. He does have NTG on hand but has never used. Lab today. Will try to get his graft report. Noted LIMA to LAD, SVG to DX, SVG to RCA which has been intervened on.   2. 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 Burt Knack did NOT feel any treatment was necessary at that time and that this is oxygenated blood going to oxygenated blood.  3. HTN - BP is fine here today.   4. HLD - on statin therapy  5. Obesity - actively losing weight.   6. AAA - last studied in December and was stable.   7. Prior  LV dysfunction - normal EF by echo from 2016 - he is  on ACE and beta blocker. See #1.   8. Resting bradycardia - he is not symptomatic.   Current medicines are reviewed with the patient today.  The patient does not have concerns regarding medicines other than what has been noted above.  The following changes have been made:  See above.  Labs/ tests ordered today include:    Orders Placed This Encounter  Procedures  . Comprehensive metabolic panel  . CBC  . EKG 12-Lead     Disposition:   FU with me in a few weeks for post cath visit.   Patient is agreeable to this plan and will call if any problems develop in the interim.   SignedTruitt Merle, NP  05/06/2020 3:55 PM  [facility] [address] [place], [place]  [address] Phone: [phone] Fax: [phone]

## 2020-04-30 NOTE — Telephone Encounter (Signed)
Tried to call pt back unavailable.  Sent pt a mychart message moved appointment up to next week, June 22 @ 3:15 pm.

## 2020-04-30 NOTE — Progress Notes (Signed)
CARDIOLOGY OFFICE NOTE  Date:  05/06/2020    Dave Robles Date of Birth: 1945-12-27 Medical Record #[id number]  PCP:  Dorothyann Peng, NP  Cardiologist:  Annabell Howells    Chief Complaint  Patient presents with  . Shortness of Breath    History of Present Illness: Dave Robles is a 74 y.o. male who presents today for a work in visit. Former patient of Dr. Claris Gladden - typically follows with me.  He has a history of known CAD with remoteCABG in 1991 per Dr. Kateri Plummer, [place], ED, pasttobacco abuse, asthma, AAA (last measured in 10/2019 - 3.4 cm)and depression.He had a non-ST elevationmyocardial infarction in 2016 while vacationing in [place].Cathnoted to have NL left main, 90% prox LAD, 99% mid LAD with patent LIMA to mid LAD and patent SVG to DX. LCX origin stenosis vs flow void? Fistula vs competitive flow in branch?, 100% RCA, SVG to RCA 99% origin treated with Xience DES and PTCA to the distal.  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 Burt Knack did NOT feel any treatment is necessary - this is oxygenated blood going to oxygenated blood.  Myoviewdonein April 2019 for atypical shoulder pain(no DOE at that time) was low risk with EF 54 and no ischemia.He has had mild LV dysfunction. He does have cardiology arranged in [place] due to having a home there as well as here. When seen here in March - he was doing well.   Called last week with new onset of shortness of breath - thus added on for today.   The patient does not have symptoms concerning for COVID-19 infection (fever, chills, cough, or new shortness of breath).   Comes in today. Here alone. He has been fully vaccinated. He notes he is now short of breath. Identical to his issues 5 years ago while he was in [place] - ended up with NSTEMI and a stent at that time. Feels like he has to work harder  than needs to when riding his bike. Over last 4 to 6 weeks - going up the inclines - he now has to stop and catch his breath due to being short of breath. This has happened with just walking as well.  No chest pain - has never had any chest pain syndrome - shortness of breath has always been his anginal equivalent. Tells me several times how this current situation is like when he had his NSTEMI in 2016 while in [place]. He is concerned that something "is going on in there". He has had CXR and blood drawn to make sure he did not have "Valley Fever" - does not have. CXR was ok. Lab was ok. No NTG use. His grafts are now 73 years old. He has been working on his weight.   Past Medical History:  Diagnosis Date  . AAA (abdominal aortic aneurysm) ([place])   . Adjustment disorder with depressed mood 03/04/2009  . Arthritis   . Colon polyps   . CORONARY ARTERY DISEASE 07/10/2007  . Depression   . ERECTILE DYSFUNCTION 07/10/2007  . Extrinsic asthma, unspecified 09/10/2008  . HYPERLIPIDEMIA 07/10/2007  . HYPERTENSION 07/10/2007  . Myocardial infarction [facility]) 2016   when had angioplasty- was told had a silent MI  . TOBACCO USE 12/15/2009    Past Surgical History:  Procedure Laterality Date  . COLONOSCOPY    . CORONARY ANGIOPLASTY  12/2014  .  CORONARY ARTERY BYPASS GRAFT  11/15/89   5 vessels  . HYDROCELE EXCISION / REPAIR  2011  . INGUINAL HERNIA REPAIR Left 1985  . TOTAL HIP ARTHROPLASTY Right 04/19/2018   Procedure: RIGHT TOTAL HIP ARTHROPLASTY ANTERIOR APPROACH;  Surgeon: Gaynelle Arabian, MD;  Location: [facility];  Service: Orthopedics;  Laterality: Right;     Medications: Current Meds  Medication Sig  . acetaminophen (TYLENOL) 325 MG tablet Take 650 mg by mouth every 6 (six) hours as needed for moderate pain.  Marland Kitchen atorvastatin (LIPITOR) 80 MG tablet TAKE 1 TABLET DAILY  . clopidogrel (PLAVIX) 75 MG tablet Take 1 tablet (75 mg total) by mouth daily.  Marland Kitchen lisinopril-hydrochlorothiazide (ZESTORETIC) 20-12.5 MG  tablet Take 1 tablet by mouth daily.  . metoprolol tartrate (LOPRESSOR) 25 MG tablet Take 1 tablet (25 mg total) by mouth 2 (two) times daily.  . Multiple Vitamin (MULTIVITAMIN) capsule Take 1 capsule by mouth daily.    . naproxen sodium (ALEVE) 220 MG tablet Take 220 mg by mouth daily as needed.  . nitroGLYCERIN (NITROSTAT) 0.4 MG SL tablet Place 1 tablet (0.4 mg total) under the tongue every 5 (five) minutes as needed.  . sildenafil (VIAGRA) 100 MG tablet Take 100 mg by mouth daily as needed for erectile dysfunction.     Allergies: No Known Allergies  Social History: The patient  reports that he quit smoking about 10 years ago. He smoked 0.50 packs per day. He has never used smokeless tobacco. He reports current alcohol use of about 5.0 standard drinks of alcohol per week. He reports that he does not use drugs.   Family History: The patient's family history includes Cancer in his mother; Coronary artery disease in his father; Depression in his mother and paternal grandfather; Hyperlipidemia in an other family member; Hypertension in an other family member.   Review of Systems: Please see the history of present illness.   All other systems are reviewed and negative.   Physical Exam: VS:  BP 130/70   Pulse (!) 57   Ht 5\' 10"  (1.778 m)   Wt 235 lb 12.8 oz (107 kg)   SpO2 96%   BMI 33.83 kg/m  .  BMI Body mass index is 33.83 kg/m.  Wt Readings from Last 3 Encounters:  05/06/20 235 lb 12.8 oz (107 kg)  04/29/20 242 lb (109.8 kg)  01/16/2020 242 lb 1.9 oz (109.8 kg)    General: Pleasant. Well developed, well nourished and in no acute distress.  His weight is down 7 pounds. Remains obese.  HEENT: Normal.  Neck: Supple, no JVD, carotid bruits, or masses noted.  Cardiac: Regular rate and rhythm. No murmurs, rubs, or gallops. No edema.  Respiratory:  Lungs are clear to auscultation bilaterally with normal work of breathing.  GI: Soft and nontender.  MS: No deformity or atrophy. Gait  and ROM intact.  Skin: Warm and dry. Color is normal.  Neuro:  Strength and sensation are intact and no gross focal deficits noted.  Psych: Alert, appropriate and with normal affect.   LABORATORY DATA:  EKG:  EKG is ordered today.  Personally reviewed by me. This demonstrates sinus brady with inferior T wave changes - HR is 57.  Lab Results  Component Value Date   WBC 8.3 04/30/2020   HGB 16.2 04/30/2020   HCT 46.4 04/30/2020   PLT 151.0 04/30/2020   GLUCOSE 93 01/10/2020   CHOL 119 01/10/2020   TRIG 90.0 01/10/2020   HDL 28.90 (L) 01/10/2020  LDLDIRECT 66.7 11/30/2011   LDLCALC 72 01/10/2020   ALT 25 01/10/2020   AST 24 01/10/2020   NA 138 01/10/2020   K 3.8 01/10/2020   CL 103 01/10/2020   CREATININE 1.40 01/10/2020   BUN 25 (H) 01/10/2020   CO2 27 01/10/2020   TSH 2.57 01/10/2020   PSA 1.30 01/10/2020   INR 1.06 04/11/2018     BNP (last 3 results) No results for input(s): BNP in the last 8760 hours.  ProBNP (last 3 results) No results for input(s): PROBNP in the last 8760 hours.   Other Studies Reviewed Today:  AAA [place] Summary 10/2019:  Abdominal Aorta: There is evidence of abnormal dilatation of the proximal, mid and distal Abdominal aorta. The largest aortic measurement is 3.4 cm. The largest aortic diameter remains essentially unchanged compared to prior exam. Previous diameter  measurement was 3.4 cm obtained on 11/01/18.  Stenosis: +------------------+-------------+  Location     Stenosis     +------------------+-------------+  Right Common Iliac<50% stenosis  +------------------+-------------+  Left Common Iliac <50% stenosis  +------------------+-------------+   IVC/Iliac: There is no evidence of thrombus involving the IVC.    *See table(s) above for measurements and observations.  Suggest follow up study in 12 months.    MyoviewStudy Highlights4/2019   Nuclear stress EF: 54%.  There is a small defect of mild  severity present in the basal inferior location. The defect is non-reversible and consistent with diaphragmatic attenuation artifact. No ischemia noted.  The left ventricular ejection fraction is mildly decreased (45-54%).  Compared to study of 2014, fixed basal inferior defect is new.  There was no ST segment deviation noted during stress.  This is a low risk study.    Echo Study Conclusions from 03/2015  - Left ventricle: The cavity size was normal. Systolic function was normal. The estimated ejection fraction was in the range of 55% to 60%. Wall motion was normal; there were no regional wall motion abnormalities. Left ventricular diastolic function parameters were normal. - Left atrium: The atrium was mildly dilated. - Atrial septum: No defect or patent foramen ovale was identified    Assessment/Plan:  1. Recurrent DOE - his anginal equivalent - we discussed options - given that his bypass grafts are now 73 years old - he had DES to SVG back in RCA from 2016 while in [place] -  will proceed with repeat cath - The patient understands that risks include but are not limited to stroke (1 in 1000), death (1 in 1000), kidney failure [usually temporary] (1 in 500), bleeding (1 in 200), allergic reaction [possibly serious] (1 in 200), and agrees to proceed. Arranged for next week with Dr. Tamala Julian. He is to limit his activities in the meantime. He does have NTG on hand but has never used. Lab today. Will try to get his graft report. Noted LIMA to LAD, SVG to DX, SVG to RCA which has been intervened on.   2. 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 Burt Knack did NOT feel any treatment was necessary at that time and that this is oxygenated blood going to oxygenated blood.  3. HTN - BP is fine here today.   4. HLD - on statin therapy  5. Obesity - actively losing weight.   6. AAA - last studied in December and was stable.   7. Prior  LV dysfunction - normal EF by echo from 2016 - he is  on ACE and beta blocker. See #1.   8. Resting bradycardia - he is not symptomatic.   Current medicines are reviewed with the patient today.  The patient does not have concerns regarding medicines other than what has been noted above.  The following changes have been made:  See above.  Labs/ tests ordered today include:    Orders Placed This Encounter  Procedures  . Comprehensive metabolic panel  . CBC  . EKG 12-Lead     Disposition:   FU with me in a few weeks for post cath visit.   Patient is agreeable to this plan and will call if any problems develop in the interim.   SignedTruitt Merle, NP  05/06/2020 3:55 PM  [facility] [address] [place], [place]  [address] Phone: [phone] Fax: [phone]

## 2020-05-03 LAB — COCCIDIOIDES ANTIBODIES: COCCIDIOIDES AB, CF, SERUM: <1:2 {titer}

## 2020-05-06 ENCOUNTER — Other Ambulatory Visit: Payer: Self-pay

## 2020-05-06 ENCOUNTER — Ambulatory Visit: Payer: Medicare HMO | Admitting: Nurse Practitioner

## 2020-05-06 ENCOUNTER — Encounter: Payer: Self-pay | Admitting: Nurse Practitioner

## 2020-05-06 VITALS — BP 130/70 | HR 57 | Ht 70.0 in | Wt 235.8 lb

## 2020-05-06 DIAGNOSIS — R0609 Other forms of dyspnea: Secondary | ICD-10-CM

## 2020-05-06 DIAGNOSIS — R0600 Dyspnea, unspecified: Secondary | ICD-10-CM

## 2020-05-06 DIAGNOSIS — Z951 Presence of aortocoronary bypass graft: Secondary | ICD-10-CM | POA: Diagnosis not present

## 2020-05-06 DIAGNOSIS — L570 Actinic keratosis: Secondary | ICD-10-CM | POA: Diagnosis not present

## 2020-05-06 DIAGNOSIS — L940 Localized scleroderma [morphea]: Secondary | ICD-10-CM | POA: Diagnosis not present

## 2020-05-06 DIAGNOSIS — E7800 Pure hypercholesterolemia, unspecified: Secondary | ICD-10-CM

## 2020-05-06 DIAGNOSIS — I2510 Atherosclerotic heart disease of native coronary artery without angina pectoris: Secondary | ICD-10-CM

## 2020-05-06 NOTE — Patient Instructions (Addendum)
After Visit Summary:  We will be checking the following labs today - CMET/CBC   Medication Instructions:    Continue with your current medicines.    If you need a refill on your cardiac medications before your next appointment, please call your pharmacy.     Testing/Procedures To Be Arranged:  Cardiac catheterization  Follow-Up:   See me back in a few weeks for discussion    At [facility], you and your health needs are our priority.  As part of our continuing mission to provide you with exceptional heart care, we have created designated Provider Care Teams.  These Care Teams include your primary Cardiologist (physician) and Advanced Practice Providers (APPs -  Physician Assistants and Nurse Practitioners) who all work together to provide you with the care you need, when you need it.  Special Instructions:  . Stay safe, wash your hands for at least 20 seconds and wear a mask when needed.  . It was good to talk with you today.   Your provider has recommended a cardiac catherization.   You are scheduled for a cardiac catheterization on Tuesday, June 29th at 7:30AM with Dr. Tamala Julian or associate.  Please arrive at the [facility] (Main Entrance) at [facility] at [address] [place] Stay on Tuesday, June 29th at [place] parking is available.   You are allowed only one visitor in the hospital with you. Both you and your visitor must wear masks.    Special note: Every effort is made to have your procedure done on time.   Please understand that emergencies sometimes delay a scheduled   procedure.  No food or drink after midnight on Monday.   On the morning of your procedure, take your Plavix .   You may take your morning medications with a sip of water on the day of your procedure.  Please take a baby aspirin (81 mg) on the morning of your procedure.   Medications to [place] - Lisinopril HCTZ   Plan for a one  night stay -- bring personal belongings.  Bring a current list of your medications and current insurance cards.  You MUST have a responsible person to drive you home. Someone MUST be with you the first 24 hours after you arrive home or your discharge will be delayed. Wear clothes that are easy to get on and off and wear slip on shoes.    Coronary Angiogram A coronary angiogram, also called coronary angiography, is an X-ray procedure used to look at the arteries in the heart. In this procedure, a dye (contrast dye) is injected through a long, hollow tube (catheter). The catheter is about the size of a piece of cooked spaghetti and is inserted through your groin, wrist, or arm. The dye is injected into each artery, and X-rays are then taken to show if there is a blockage in the arteries of your heart.  LET [facility] CARE PROVIDER KNOW ABOUT:  Any allergies you have, including allergies to shellfish or contrast dye.    All medicines you are taking, including vitamins, herbs, eye drops, creams, and over-the-counter medicines.    Previous problems you or members of your family have had with the use of anesthetics.    Any blood disorders you have.    Previous surgeries you have had.  History of kidney problems or failure.    Other medical conditions you have.  RISKS AND COMPLICATIONS  Generally, a coronary angiogram is a safe procedure. However, about 1 person out of 1000 can have problems that may include:  Allergic reaction to the dye.  Bleeding/bruising from the access site or other locations.  Kidney injury, especially in people with impaired kidney function.   Stroke (rare).  Heart attack (rare).  Irregular rhythms (rare)  Death (rare)  BEFORE THE PROCEDURE   Do not eat or drink anything after midnight the night before the procedure or as directed by your health care provider.    Ask your health care provider about changing or stopping your regular medicines. This is  especially important if you are taking diabetes medicines or blood thinners.  PROCEDURE  You may be given a medicine to help you relax (sedative) before the procedure. This medicine is given through an intravenous (IV) access tube that is inserted into one of your veins.    The area where the catheter will be inserted will be washed and shaved. This is usually done in the groin but may be done in the fold of your arm (near your elbow) or in the wrist.     A medicine will be given to numb the area where the catheter will be inserted (local anesthetic).    The health care provider will insert the catheter into an artery. The catheter will be guided by using a special type of X-ray (fluoroscopy) of the blood vessel being examined.    A special dye will then be injected into the catheter, and X-rays will be taken. The dye will help to show where any narrowing or blockages are located in the heart arteries.     AFTER THE PROCEDURE   If the procedure is done through the leg, you will be kept in bed lying flat for several hours. You will be instructed to not bend or cross your legs.  The insertion site will be checked frequently.    The pulse in your feet or wrist will be checked frequently.    Additional blood tests, X-rays, and an electrocardiogram may be done.       Call the [place] office at [phone] if you have any questions, problems or concerns.

## 2020-05-07 ENCOUNTER — Other Ambulatory Visit: Payer: Self-pay | Admitting: *Deleted

## 2020-05-07 LAB — CBC
Hematocrit: 47.3 % (ref 37.5–51.0)
Hemoglobin: 16.5 g/dL (ref 13.0–17.7)
MCH: 32.2 pg (ref 26.6–33.0)
MCHC: 34.9 g/dL (ref 31.5–35.7)
MCV: 92 fL (ref 79–97)
Platelets: 172 10*3/uL (ref 150–450)
RBC: 5.12 x10E6/uL (ref 4.14–5.80)
RDW: 12.8 % (ref 11.6–15.4)
WBC: 8.1 10*3/uL (ref 3.4–10.8)

## 2020-05-07 LAB — COMPREHENSIVE METABOLIC PANEL
ALT: 32 IU/L (ref 0–44)
AST: 27 IU/L (ref 0–40)
Albumin/Globulin Ratio: 2.1 (ref 1.2–2.2)
Albumin: 4.2 g/dL (ref 3.7–4.7)
Alkaline Phosphatase: 90 IU/L (ref 48–121)
BUN/Creatinine Ratio: 11 (ref 10–24)
BUN: 14 mg/dL (ref 8–27)
Bilirubin Total: 1.1 mg/dL (ref 0.0–1.2)
CO2: 27 mmol/L (ref 20–29)
Calcium: 9.7 mg/dL (ref 8.6–10.2)
Chloride: 103 mmol/L (ref 96–106)
Creatinine, Ser: 1.26 mg/dL (ref 0.76–1.27)
GFR calc Af Amer: 65 mL/min/{1.73_m2} (ref >59)
GFR calc non Af Amer: 57 mL/min/{1.73_m2} — ABNORMAL LOW (ref >59)
Globulin, Total: 2 g/dL (ref 1.5–4.5)
Glucose: 108 mg/dL — ABNORMAL HIGH (ref 65–99)
Potassium: 4.1 mmol/L (ref 3.5–5.2)
Sodium: 142 mmol/L (ref 134–144)
Total Protein: 6.2 g/dL (ref 6.0–8.5)

## 2020-05-07 NOTE — Progress Notes (Signed)
Pt is on a Calcipotriene Creme .005% bid per dermatology. Cannot find in database and will not go through with non formulary.

## 2020-05-12 ENCOUNTER — Telehealth: Payer: Self-pay

## 2020-05-12 NOTE — Telephone Encounter (Signed)
Pt contacted pre-catheterization scheduled at [facility] for: 05/13/2020 @ 7:30 Verified arrival time and place: [facility]) at: 7:30   No solid food after midnight prior to cath, clear liquids until 5 AM day of procedure. Aware  AM meds can be  taken pre-cath with sips of water including: ASA 81 mg and Plavix  Hold Zestoretic beginning 05/12/2020  Confirmed patient has responsible adult to drive home post procedure and observe 24 hours after arriving home: Yes (wife)  You are allowed ONE visitor in the waiting room during your procedure. Both you and your visitor must wear masks. Aware      COVID-19 Pre-Screening Questions:  . In the past 7 to 10 days have you had a new cough, shortness of breath, headache, congestion, fever (100 or greater) unexplained body aches, new sore throat, or sudden loss of taste or sense of smell? No . In the past 7 to 10 days have you been around anyone with known Covid 19? No  Pt is fully vaccinated had 1st vaccine in [place].

## 2020-05-12 NOTE — H&P (Signed)
   Recent development of exertional dyspnea/discomfort similar to prior angina.  Has history of prior CABG and known relatively recent stent and saphenous vein graft to RCA.  Coronary angiography to define anatomy and help guide therapy.

## 2020-05-13 ENCOUNTER — Encounter (HOSPITAL_COMMUNITY)
Admission: RE | Disposition: A | Discharge status: Home / Self Care | Payer: Self-pay | Source: Home / Self Care | Attending: Interventional Cardiology

## 2020-05-13 ENCOUNTER — Encounter (HOSPITAL_COMMUNITY): Payer: Self-pay | Admitting: Interventional Cardiology

## 2020-05-13 ENCOUNTER — Other Ambulatory Visit: Payer: Self-pay

## 2020-05-13 ENCOUNTER — Ambulatory Visit (HOSPITAL_COMMUNITY)
Admission: RE | Admit: 2020-05-13 | Discharge: 2020-05-13 | Disposition: A | Discharge status: Home / Self Care | Payer: Medicare HMO | Attending: Interventional Cardiology | Admitting: Interventional Cardiology

## 2020-05-13 DIAGNOSIS — I2510 Atherosclerotic heart disease of native coronary artery without angina pectoris: Secondary | ICD-10-CM | POA: Diagnosis not present

## 2020-05-13 DIAGNOSIS — Z6833 Body mass index (BMI) 33.0-33.9, adult: Secondary | ICD-10-CM | POA: Diagnosis not present

## 2020-05-13 DIAGNOSIS — I714 Abdominal aortic aneurysm, without rupture, unspecified: Secondary | ICD-10-CM | POA: Diagnosis present

## 2020-05-13 DIAGNOSIS — E669 Obesity, unspecified: Secondary | ICD-10-CM | POA: Diagnosis not present

## 2020-05-13 DIAGNOSIS — R69 Illness, unspecified: Secondary | ICD-10-CM | POA: Diagnosis not present

## 2020-05-13 DIAGNOSIS — Z87891 Personal history of nicotine dependence: Secondary | ICD-10-CM | POA: Insufficient documentation

## 2020-05-13 DIAGNOSIS — E7800 Pure hypercholesterolemia, unspecified: Secondary | ICD-10-CM

## 2020-05-13 DIAGNOSIS — Z96641 Presence of right artificial hip joint: Secondary | ICD-10-CM | POA: Diagnosis not present

## 2020-05-13 DIAGNOSIS — I2582 Chronic total occlusion of coronary artery: Secondary | ICD-10-CM | POA: Insufficient documentation

## 2020-05-13 DIAGNOSIS — I25118 Atherosclerotic heart disease of native coronary artery with other forms of angina pectoris: Secondary | ICD-10-CM | POA: Insufficient documentation

## 2020-05-13 DIAGNOSIS — J45909 Unspecified asthma, uncomplicated: Secondary | ICD-10-CM | POA: Insufficient documentation

## 2020-05-13 DIAGNOSIS — R0600 Dyspnea, unspecified: Secondary | ICD-10-CM | POA: Diagnosis present

## 2020-05-13 DIAGNOSIS — M1990 Unspecified osteoarthritis, unspecified site: Secondary | ICD-10-CM | POA: Diagnosis not present

## 2020-05-13 DIAGNOSIS — E785 Hyperlipidemia, unspecified: Secondary | ICD-10-CM | POA: Diagnosis not present

## 2020-05-13 DIAGNOSIS — Z8249 Family history of ischemic heart disease and other diseases of the circulatory system: Secondary | ICD-10-CM | POA: Insufficient documentation

## 2020-05-13 DIAGNOSIS — T82855A Stenosis of coronary artery stent, initial encounter: Secondary | ICD-10-CM | POA: Diagnosis not present

## 2020-05-13 DIAGNOSIS — I10 Essential (primary) hypertension: Secondary | ICD-10-CM | POA: Insufficient documentation

## 2020-05-13 DIAGNOSIS — Z7902 Long term (current) use of antithrombotics/antiplatelets: Secondary | ICD-10-CM | POA: Diagnosis not present

## 2020-05-13 DIAGNOSIS — F329 Major depressive disorder, single episode, unspecified: Secondary | ICD-10-CM | POA: Insufficient documentation

## 2020-05-13 DIAGNOSIS — Z79899 Other long term (current) drug therapy: Secondary | ICD-10-CM | POA: Diagnosis not present

## 2020-05-13 DIAGNOSIS — I2584 Coronary atherosclerosis due to calcified coronary lesion: Secondary | ICD-10-CM | POA: Insufficient documentation

## 2020-05-13 DIAGNOSIS — I252 Old myocardial infarction: Secondary | ICD-10-CM | POA: Diagnosis not present

## 2020-05-13 DIAGNOSIS — R0609 Other forms of dyspnea: Secondary | ICD-10-CM

## 2020-05-13 DIAGNOSIS — I25810 Atherosclerosis of coronary artery bypass graft(s) without angina pectoris: Secondary | ICD-10-CM | POA: Insufficient documentation

## 2020-05-13 HISTORY — PX: LEFT HEART CATH AND CORS/GRAFTS ANGIOGRAPHY: CATH118250

## 2020-05-13 SURGERY — LEFT HEART CATH AND CORS/GRAFTS ANGIOGRAPHY
Anesthesia: LOCAL

## 2020-05-13 MED ORDER — HEPARIN (PORCINE) IN NACL 1000-0.9 UT/500ML-% IV SOLN
INTRAVENOUS | Status: AC
  Filled 2020-05-13: qty 500

## 2020-05-13 MED ORDER — MIDAZOLAM HCL 2 MG/2ML IJ SOLN
INTRAMUSCULAR | Status: AC
  Filled 2020-05-13: qty 2

## 2020-05-13 MED ORDER — LABETALOL HCL 5 MG/ML IV SOLN: 10.0000 mg | INTRAVENOUS | Status: DC | PRN

## 2020-05-13 MED ORDER — SODIUM CHLORIDE 0.9% FLUSH: 3.0000 mL | INTRAVENOUS | Status: DC | PRN

## 2020-05-13 MED ORDER — SODIUM CHLORIDE 0.9 % IV SOLN: 250.0000 mL | INTRAVENOUS | Status: DC | PRN

## 2020-05-13 MED ORDER — OXYCODONE HCL 5 MG PO TABS: 5.0000 mg | ORAL_TABLET | ORAL | Status: DC | PRN

## 2020-05-13 MED ORDER — IOHEXOL 350 MG/ML SOLN
INTRAVENOUS | Status: AC
  Filled 2020-05-13: qty 1

## 2020-05-13 MED ORDER — SODIUM CHLORIDE 0.9 % IV SOLN: INTRAVENOUS | Status: DC

## 2020-05-13 MED ORDER — LIDOCAINE HCL (PF) 1 % IJ SOLN
INTRAMUSCULAR | Status: AC
  Filled 2020-05-13: qty 30

## 2020-05-13 MED ORDER — ASPIRIN 81 MG PO CHEW: 81.0000 mg | CHEWABLE_TABLET | ORAL | Status: DC

## 2020-05-13 MED ORDER — HEPARIN (PORCINE) IN NACL 1000-0.9 UT/500ML-% IV SOLN
INTRAVENOUS | Status: DC | PRN
  Administered 2020-05-13 (×2): 500 mL

## 2020-05-13 MED ORDER — SODIUM CHLORIDE 0.9 % IV SOLN: INTRAVENOUS | Status: AC

## 2020-05-13 MED ORDER — ONDANSETRON HCL 4 MG/2ML IJ SOLN: 4.0000 mg | Freq: Four times a day (QID) | INTRAMUSCULAR | Status: DC | PRN

## 2020-05-13 MED ORDER — ISOSORBIDE MONONITRATE ER 30 MG PO TB24
30.0000 mg | ORAL_TABLET | Freq: Every day | ORAL | 11 refills | Status: DC
Start: 2020-05-13 — End: 2020-07-09

## 2020-05-13 MED ORDER — HEPARIN (PORCINE) IN NACL 2000-0.9 UNIT/L-% IV SOLN
INTRAVENOUS | Status: AC
  Filled 2020-05-13: qty 1000

## 2020-05-13 MED ORDER — FENTANYL CITRATE (PF) 100 MCG/2ML IJ SOLN
INTRAMUSCULAR | Status: AC
  Filled 2020-05-13: qty 2

## 2020-05-13 MED ORDER — LIDOCAINE HCL (PF) 1 % IJ SOLN
INTRAMUSCULAR | Status: DC | PRN
  Administered 2020-05-13: 30 mL via INTRADERMAL

## 2020-05-13 MED ORDER — FENTANYL CITRATE (PF) 100 MCG/2ML IJ SOLN
INTRAMUSCULAR | Status: DC | PRN
  Administered 2020-05-13 (×2): 25 ug via INTRAVENOUS

## 2020-05-13 MED ORDER — MIDAZOLAM HCL 2 MG/2ML IJ SOLN
INTRAMUSCULAR | Status: DC | PRN
  Administered 2020-05-13 (×2): 1 mg via INTRAVENOUS

## 2020-05-13 MED ORDER — IOHEXOL 350 MG/ML SOLN
INTRAVENOUS | Status: DC | PRN
  Administered 2020-05-13: 130 mL via INTRA_ARTERIAL

## 2020-05-13 MED ORDER — HYDRALAZINE HCL 20 MG/ML IJ SOLN: 10.0000 mg | INTRAMUSCULAR | Status: DC | PRN

## 2020-05-13 MED ORDER — SODIUM CHLORIDE 0.9% FLUSH: 3.0000 mL | Freq: Two times a day (BID) | INTRAVENOUS | Status: DC

## 2020-05-13 MED ORDER — ACETAMINOPHEN 325 MG PO TABS: 650.0000 mg | ORAL_TABLET | ORAL | Status: DC | PRN

## 2020-05-13 MED ORDER — DIAZEPAM 5 MG PO TABS
5.0000 mg | ORAL_TABLET | ORAL | Status: AC
  Administered 2020-05-13: 5 mg via ORAL
  Filled 2020-05-13: qty 1

## 2020-05-13 SURGICAL SUPPLY — 9 items
CATH INFINITI 5 FR IM (CATHETERS) ×1 IMPLANT
CATH INFINITI 5FR JL4 (CATHETERS) ×1 IMPLANT
CATH INFINITI 5FR MPB2 (CATHETERS) ×1 IMPLANT
KIT HEART LEFT (KITS) ×2 IMPLANT
PACK CARDIAC CATHETERIZATION (CUSTOM PROCEDURE TRAY) ×2 IMPLANT
SHEATH PINNACLE 5F 10CM (SHEATH) ×1 IMPLANT
TRANSDUCER W/STOPCOCK (MISCELLANEOUS) ×2 IMPLANT
TUBING CIL FLEX 10 FLL-RA (TUBING) ×2 IMPLANT
WIRE EMERALD 3MM-J .035X150CM (WIRE) ×1 IMPLANT

## 2020-05-13 NOTE — Progress Notes (Signed)
Site: right femoral Sheath Size: 106fr arterial Condition prior to removal:  Level 0 / bruising below site / present on arrival Type of pressure held:manual Time pressure held: 20 minutes Status of patient during pull: stable Condition of site post pull:  Level 0 Type of dressing applied: gauze w/ transparent Pulses verified: right dorsalis pedis 3+ Patient's condition post pull: stable Bedrest begins at:  0920 Post instructions given to patient: yes

## 2020-05-13 NOTE — Discharge Instructions (Signed)
Angiogram, Care After °This sheet gives you information about how to care for yourself after your procedure. Your health care provider may also give you more specific instructions. If you have problems or questions, contact your health care provider. °What can I expect after the procedure? °After the procedure, it is common to have bruising and tenderness at the catheter insertion area. °Follow these instructions at home: °Insertion site care °· Follow instructions from your health care provider about how to take care of your insertion site. Make sure you: °? Wash your hands with soap and water before you change your bandage (dressing). If soap and water are not available, use hand sanitizer. °? Change your dressing as told by your health care provider. °? Leave stitches (sutures), skin glue, or adhesive strips in place. These skin closures may need to stay in place for 2 weeks or longer. If adhesive strip edges start to loosen and curl up, you may trim the loose edges. Do not remove adhesive strips completely unless your health care provider tells you to do that. °· Do not take baths, swim, or use a hot tub until your health care provider approves. °· You may shower 24-48 hours after the procedure or as told by your health care provider. °? Gently wash the site with plain soap and water. °? Pat the area dry with a clean towel. °? Do not rub the site. This may cause bleeding. °· Do not apply powder or lotion to the site. Keep the site clean and dry. °· Check your insertion site every day for signs of infection. Check for: °? Redness, swelling, or pain. °? Fluid or blood. °? Warmth. °? Pus or a bad smell. °Activity °· Rest as told by your health care provider, usually for 1-2 days. °· Do not lift anything that is heavier than 10 lbs. (4.5 kg) or as told by your health care provider. °· Do not drive for 24 hours if you were given a medicine to help you relax (sedative). °· Do not drive or use heavy machinery while  taking prescription pain medicine. °General instructions ° °· Return to your normal activities as told by your health care provider, usually in about a week. Ask your health care provider what activities are safe for you. °· If the catheter site starts bleeding, lie flat and put pressure on the site. If the bleeding does not stop, get help right away. This is a medical emergency. °· Drink enough fluid to keep your urine clear or pale yellow. This helps flush the contrast dye from your body. °· Take over-the-counter and prescription medicines only as told by your health care provider. °· Keep all follow-up visits as told by your health care provider. This is important. °Contact a health care provider if: °· You have a fever or chills. °· You have redness, swelling, or pain around your insertion site. °· You have fluid or blood coming from your insertion site. °· The insertion site feels warm to the touch. °· You have pus or a bad smell coming from your insertion site. °· You have bruising around the insertion site. °· You notice blood collecting in the tissue around the catheter site (hematoma). The hematoma may be painful to the touch. °Get help right away if: °· You have severe pain at the catheter insertion area. °· The catheter insertion area swells very fast. °· The catheter insertion area is bleeding, and the bleeding does not stop when you hold steady pressure on the area. °·   The area near or just beyond the catheter insertion site becomes pale, cool, tingly, or numb. These symptoms may represent a serious problem that is an emergency. Do not wait to see if the symptoms will go away. Get medical help right away. Call your local emergency services (911 in the [place] Do not drive yourself to the hospital. Summary  After the procedure, it is common to have bruising and tenderness at the catheter insertion area.  After the procedure, it is important to rest and drink plenty of fluids.  Do not take baths,  swim, or use a hot tub until your health care provider says it is okay to do so. You may shower 24-48 hours after the procedure or as told by your health care provider.  If the catheter site starts bleeding, lie flat and put pressure on the site. If the bleeding does not stop, get help right away. This is a medical emergency. This information is not intended to replace advice given to you by your health care provider. Make sure you discuss any questions you have with your health care provider. Document Revised: 10/14/2017 Document Reviewed: 10/06/2016 Elsevier Patient Education  [place].   Isosorbide Mononitrate extended-release tablets What is this medicine? ISOSORBIDE MONONITRATE (eye soe SOR bide mon oh NYE trate) is a vasodilator. It relaxes blood vessels, increasing the blood and oxygen supply to your heart. This medicine is used to prevent chest pain caused by angina. It will not help to stop an episode of chest pain. This medicine may be used for other purposes; ask your health care provider or pharmacist if you have questions. COMMON BRAND NAME(S): Imdur, Isotrate ER What should I tell my health care provider before I take this medicine? They need to know if you have any of these conditions:  previous heart attack or heart failure  an unusual or allergic reaction to isosorbide mononitrate, nitrates, other medicines, foods, dyes, or preservatives  pregnant or trying to get pregnant  breast-feeding How should I use this medicine? Take this medicine by mouth with a glass of water. Follow the directions on the prescription label. Do not crush or chew. Take your medicine at regular intervals. Do not take your medicine more often than directed. Do not stop taking this medicine except on the advice of your doctor or health care professional. Talk to your pediatrician regarding the use of this medicine in children. Special care may be needed. Overdosage: If you think you have  taken too much of this medicine contact a poison control center or emergency room at once. NOTE: This medicine is only for you. Do not share this medicine with others. What if I miss a dose? If you miss a dose, take it as soon as you can. If it is almost time for your next dose, take only that dose. Do not take double or extra doses. What may interact with this medicine? Do not take this medicine with any of the following medications:  medicines used to treat erectile dysfunction (ED) like avanafil, sildenafil, tadalafil, and vardenafil  riociguat This medicine may also interact with the following medications:  medicines for high blood pressure  other medicines for angina or heart failure This list may not describe all possible interactions. Give your health care provider a list of all the medicines, herbs, non-prescription drugs, or dietary supplements you use. Also tell them if you smoke, drink alcohol, or use illegal drugs. Some items may interact with your medicine. What should I watch  for while using this medicine? Check your heart rate and blood pressure regularly while you are taking this medicine. Ask your doctor or health care professional what your heart rate and blood pressure should be and when you should contact him or her. Tell your doctor or health care professional if you feel your medicine is no longer working. You may get dizzy. Do not drive, use machinery, or do anything that needs mental alertness until you know how this medicine affects you. To reduce the risk of dizzy or fainting spells, do not sit or stand up quickly, especially if you are an older patient. Alcohol can make you more dizzy, and increase flushing and rapid heartbeats. Avoid alcoholic drinks. Do not treat yourself for coughs, colds, or pain while you are taking this medicine without asking your doctor or health care professional for advice. Some ingredients may increase your blood pressure. What side effects  may I notice from receiving this medicine? Side effects that you should report to your doctor or health care professional as soon as possible:  bluish discoloration of lips, fingernails, or palms of hands  irregular heartbeat, palpitations  low blood pressure  nausea, vomiting  persistent headache  unusually weak or tired Side effects that usually do not require medical attention (report to your doctor or health care professional if they continue or are bothersome):  flushing of the face or neck  rash This list may not describe all possible side effects. Call your doctor for medical advice about side effects. You may report side effects to FDA at 1-800-FDA-1088. Where should I keep my medicine? Keep out of the reach of children. Store between 15 and 30 degrees C (59 and 86 degrees F). Keep container tightly closed. Throw away any unused medicine after the expiration date. NOTE: This sheet is a summary. It may not cover all possible information. If you have questions about this medicine, talk to your doctor, pharmacist, or health care provider.  2020 Elsevier/Gold Standard (2013-08-31 14:48:19)

## 2020-05-13 NOTE — Interval H&P Note (Signed)
Cath Lab Visit (complete for each Cath Lab visit)  Clinical Evaluation Leading to the Procedure:   ACS: No.  Non-ACS:    Anginal Classification: CCS III  Anti-ischemic medical therapy: Minimal Therapy (1 class of medications)  Non-Invasive Test Results: No non-invasive testing performed  Prior CABG: Previous CABG      History and Physical Interval Note:  05/13/2020 7:24 AM  Dave Robles  has presented today for surgery, with the diagnosis of shortness of breath.  The various methods of treatment have been discussed with the patient and family. After consideration of risks, benefits and other options for treatment, the patient has consented to  Procedure(s): LEFT HEART CATH AND CORS/GRAFTS ANGIOGRAPHY (N/A) as a surgical intervention.  The patient's history has been reviewed, patient examined, no change in status, stable for surgery.  I have reviewed the patient's chart and labs.  Questions were answered to the patient's satisfaction.     Belva Crome III

## 2020-05-13 NOTE — CV Procedure (Signed)
   Left heart cath with coronary angiography and bypass graft angiography via right femoral approach.  Real-time vascular ultrasound used for access.  Patent LIMA to LAD which collateralizes the acute right ventricular branch of the RCA.  Patent saphenous vein graft to PDA but with diffuse in-stent restenosis in the ostial to proximal stent from 2016.  Widely patent saphenous vein graft to the diagonal with diffuse 70 to 80% stenosis beyond the graft insertion site  Total occlusion of the mid LAD distal to the first septal perforator and diagonal.  Diffuse 40 to 50% left main and calcified 95% ostial to proximal circumflex which supplies a large obtuse marginal.  Total occlusion of the mid RCA  Normal LVEDP and LV function.

## 2020-05-14 ENCOUNTER — Institutional Professional Consult (permissible substitution): Payer: Medicare HMO | Admitting: Cardiothoracic Surgery

## 2020-05-14 ENCOUNTER — Ambulatory Visit
Admission: RE | Admit: 2020-05-14 | Discharge: 2020-05-14 | Disposition: A | Discharge status: Home / Self Care | Payer: Medicare HMO | Source: Ambulatory Visit | Attending: Cardiothoracic Surgery | Admitting: Cardiothoracic Surgery

## 2020-05-14 ENCOUNTER — Other Ambulatory Visit: Payer: Self-pay | Admitting: *Deleted

## 2020-05-14 ENCOUNTER — Encounter: Payer: Self-pay | Admitting: Cardiothoracic Surgery

## 2020-05-14 VITALS — BP 127/66 | HR 52 | Temp 97.7°F | Resp 16 | Ht 70.0 in | Wt 230.0 lb

## 2020-05-14 DIAGNOSIS — R079 Chest pain, unspecified: Secondary | ICD-10-CM | POA: Diagnosis not present

## 2020-05-14 DIAGNOSIS — I259 Chronic ischemic heart disease, unspecified: Secondary | ICD-10-CM

## 2020-05-14 DIAGNOSIS — Z951 Presence of aortocoronary bypass graft: Secondary | ICD-10-CM | POA: Diagnosis not present

## 2020-05-14 DIAGNOSIS — I2510 Atherosclerotic heart disease of native coronary artery without angina pectoris: Secondary | ICD-10-CM

## 2020-05-14 NOTE — Progress Notes (Deleted)
CARDIOLOGY OFFICE NOTE  Date:  05/14/2020    Dave Robles Date of Birth: 08-May-1946 Medical Record #[id number]  PCP:  Dorothyann Peng, NP  Cardiologist:  Servando Snare & ***    No chief complaint on file.   History of Present Illness: Dave Robles is a 74 y.o. male who presents today for a ***  Former patient of Dr. Claris Gladden - typically follows with me.  He has a history of known CAD with remoteCABG in 1991 per Dr. Kateri Plummer, [place], ED, pasttobacco abuse, asthma, AAA (last measured in 10/2019 - 3.4 cm)and depression.He had a non-ST elevationmyocardial infarction in 2016 while vacationing in [place].Cathnoted to have NL left main, 90% prox LAD, 99% mid LAD with patent LIMA to mid LAD and patent SVG to DX. LCX origin stenosis vs flow void? Fistula vs competitive flow in branch?, 100% RCA, SVG to RCA 99% origin treated with Xience DES and PTCA to the distal.  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 Burt Knack did NOT feel any treatment is necessary - this is oxygenated blood going to oxygenated blood.  Myoviewdonein April 2019 for atypical shoulder pain(no DOE at that time) was low risk with EF 54 and no ischemia.He has had mild LV dysfunction.He does have cardiology arranged in [place] due to having a home there as well as here. When seen here in March - he was doing well.   Called last week with new onset of shortness of breath - thus added on for today.   The patient does not have symptoms concerning for COVID-19 infection (fever, chills, cough, or new shortness of breath).   Comes in today. Here alone. He has been fully vaccinated. He notes he is now short of breath. Identical to his issues 5 years ago while he was in [place] - ended up with NSTEMI and a stent at that time. Feels like he has to work harder than needs to when riding his bike. Over last 4  to 6 weeks - going up the inclines - he now has to stop and catch his breath due to being short of breath. This has happened with just walking as well.  No chest pain - has never had any chest pain syndrome - shortness of breath has always been his anginal equivalent. Tells me several times how this current situation is like when he had his NSTEMI in 2016 while in [place]. He is concerned that something "is going on in there". He has had CXR and blood drawn to make sure he did not have "Valley Fever" - does not have. CXR was ok. Lab was ok. No NTG use. His grafts are now 73 years old. He has been working on his weight.   Comes in today. Here with   Past Medical History:  Diagnosis Date  . AAA (abdominal aortic aneurysm) ([place])   . Adjustment disorder with depressed mood 03/04/2009  . Arthritis   . Colon polyps   . CORONARY ARTERY DISEASE 07/10/2007  . Depression   . ERECTILE DYSFUNCTION 07/10/2007  . Extrinsic asthma, unspecified 09/10/2008  . HYPERLIPIDEMIA 07/10/2007  . HYPERTENSION 07/10/2007  . Myocardial infarction [facility]) 2016   when had angioplasty- was told had a silent MI  . TOBACCO USE 12/15/2009    Past Surgical History:  Procedure Laterality Date  . COLONOSCOPY    . CORONARY ANGIOPLASTY  12/2014  . CORONARY  ARTERY BYPASS GRAFT  11/15/89   5 vessels  . HYDROCELE EXCISION / REPAIR  2011  . INGUINAL HERNIA REPAIR Left 1985  . LEFT HEART CATH AND CORS/GRAFTS ANGIOGRAPHY N/A 05/13/2020   Procedure: LEFT HEART CATH AND CORS/GRAFTS ANGIOGRAPHY;  Surgeon: Belva Crome, MD;  Location: [facility];  Service: Cardiovascular;  Laterality: N/A;  . TOTAL HIP ARTHROPLASTY Right 04/19/2018   Procedure: RIGHT TOTAL HIP ARTHROPLASTY ANTERIOR APPROACH;  Surgeon: Gaynelle Arabian, MD;  Location: [facility];  Service: Orthopedics;  Laterality: Right;     Medications: No outpatient medications have been marked as taking for the 05/28/20 encounter (Appointment) with [name], NP.      Allergies: No Known Allergies  Social History: The patient  reports that he quit smoking about 10 years ago. He smoked 0.50 packs per day. He has never used smokeless tobacco. He reports current alcohol use of about 5.0 standard drinks of alcohol per week. He reports that he does not use drugs.   Family History: The patient's ***family history includes Cancer in his mother; Coronary artery disease in his father; Depression in his mother and paternal grandfather; Hyperlipidemia in an other family member; Hypertension in an other family member.   Review of Systems: Please see the history of present illness.   All other systems are reviewed and negative.   Physical Exam: VS:  There were no vitals taken for this visit. Marland Kitchen  BMI There is no height or weight on file to calculate BMI.  Wt Readings from Last 3 Encounters:  05/13/20 232 lb (105.2 kg)  05/06/20 235 lb 12.8 oz (107 kg)  04/29/20 242 lb (109.8 kg)    General: Pleasant. Well developed, well nourished and in no acute distress.   HEENT: Normal.  Neck: Supple, no JVD, carotid bruits, or masses noted.  Cardiac: ***Regular rate and rhythm. No murmurs, rubs, or gallops. No edema.  Respiratory:  Lungs are clear to auscultation bilaterally with normal work of breathing.  GI: Soft and nontender.  MS: No deformity or atrophy. Gait and ROM intact.  Skin: Warm and dry. Color is normal.  Neuro:  Strength and sensation are intact and no gross focal deficits noted.  Psych: Alert, appropriate and with normal affect.   LABORATORY DATA:  EKG:  EKG {ACTION; IS/IS ION:[id number]} ordered today.  Personally reviewed by me. This demonstrates ***.  Lab Results  Component Value Date   WBC 8.1 05/06/2020   HGB 16.5 05/06/2020   HCT 47.3 05/06/2020   PLT 172 05/06/2020   GLUCOSE 108 (H) 05/06/2020   CHOL 119 01/10/2020   TRIG 90.0 01/10/2020   HDL 28.90 (L) 01/10/2020   LDLDIRECT 66.7 11/30/2011   LDLCALC 72 01/10/2020   ALT 32  05/06/2020   AST 27 05/06/2020   NA 142 05/06/2020   K 4.1 05/06/2020   CL 103 05/06/2020   CREATININE 1.26 05/06/2020   BUN 14 05/06/2020   CO2 27 05/06/2020   TSH 2.57 01/10/2020   PSA 1.30 01/10/2020   INR 1.06 04/11/2018     BNP (last 3 results) No results for input(s): BNP in the last 8760 hours.  ProBNP (last 3 results) No results for input(s): PROBNP in the last 8760 hours.   Other Studies Reviewed Today:  LEFT HEART CATH AND CORS/GRAFTS ANGIOGRAPHY 04/2020  Conclusion   Total occlusion of the mid LAD, total occlusion of the mid RCA, and high-grade calcified obstruction in the ostial to proximal circumflex with diffuse 50% left  main stenosis.  Patent saphenous vein graft to the diagonal with 70 to 80% stenosis distal to the graft insertion site.  The diagonal fills retrogradely into the first septal perforator and the proximal LAD segment.  Diffuse 95% in-stent restenosis in the ostial to proximal saphenous vein graft to the distal RCA.  Normal LV function.  LVEDP is normal.  EF is estimated to be 60%.  RECOMMENDATIONS:   Critical decision point for this patient who had coronary bypass surgery in 1991, and now has diffuse in-stent restenosis in the ostium and proximal segment of the saphenous vein graft to the right coronary.  The native circumflex is heavily calcified and has critical stenosis.  The diagonal beyond the saphenous vein graft insertion site has significant disease.  Long-term patency of the right coronary graft will be markedly reduced PCI and restenting.  The technical standpoint it appears that the stent extends outside the ostium of the bypass graft.  The right coronary territory is large.  The patient is relatively healthy.  His best long-term approach may be repeat coronary bypass surgery.  Before starting down the path of stent implantation, will  refer for heart team evaluation to consider surgical revascularization.  If deemed not a surgical  candidate or if the patient prefers PCI, this could be attempted but will carry higher technical risk and likelihood of repeat revascularization.    AAA USSummary12/2020:  Abdominal Aorta: There is evidence of abnormal dilatation of the proximal, mid and distal Abdominal aorta. The largest aortic measurement is 3.4 cm. The largest aortic diameter remains essentially unchanged compared to prior exam. Previous diameter  measurement was 3.4 cm obtained on 11/01/18.  Stenosis: +------------------+-------------+  Location     Stenosis     +------------------+-------------+  Right Common Iliac<50% stenosis  +------------------+-------------+  Left Common Iliac <50% stenosis  +------------------+-------------+   IVC/Iliac: There is no evidence of thrombus involving the IVC.    *See table(s) above for measurements and observations.  Suggest follow up study in 12 months.    MyoviewStudy Highlights4/2019   Nuclear stress EF: 54%.  There is a small defect of mild severity present in the basal inferior location. The defect is non-reversible and consistent with diaphragmatic attenuation artifact. No ischemia noted.  The left ventricular ejection fraction is mildly decreased (45-54%).  Compared to study of 2014, fixed basal inferior defect is new.  There was no ST segment deviation noted during stress.  This is a low risk study.    Echo Study Conclusions from 03/2015  - Left ventricle: The cavity size was normal. Systolic function was normal. The estimated ejection fraction was in the range of 55% to 60%. Wall motion was normal; there were no regional wall motion abnormalities. Left ventricular diastolic function parameters were normal. - Left atrium: The atrium was mildly dilated. - Atrial septum: No defect or patent foramen ovale was identified    Assessment/Plan:  1.Recurrent DOE - his anginal equivalent - we discussed options - given  that his bypass grafts are now 73 years old - he had DES to SVG back in RCA from 2016 while in [place] -  will proceed with repeat cath - The patient understands that risks include but are not limited to stroke (1 in 1000), death (1 in 1000), kidney failure [usually temporary] (1 in 500), bleeding (1 in 200), allergic reaction [possibly serious] (1 in 200), and agrees to proceed. Arranged for next week with Dr. Tamala Julian. He is to limit his activities in the meantime. He  does have NTG on hand but has never used. Lab today. Will try to get his graft report. Noted LIMA to LAD, SVG to DX, SVG to RCA which has been intervened on.   2. 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 Burt Knack did NOT feel any treatment was necessary at that time and that this is oxygenated blood going to oxygenated blood.  3. HTN - BP is fine here today.   4. HLD - on statin therapy  5. Obesity - actively losing weight.   6. AAA - last studied in December and was stable.   7. Prior LV dysfunction - normal EF by echo from 2016 - he is on ACE and beta blocker. See #1.   8. Resting bradycardia - he is not symptomatic.     Current medicines are reviewed with the patient today.  The patient does not have concerns regarding medicines other than what has been noted above.  The following changes have been made:  See above.  Labs/ tests ordered today include:   No orders of the defined types were placed in this encounter.    Disposition:   FU with *** in {gen number [id number]} {Days to years:10300}.   Patient is agreeable to this plan and will call if any problems develop in the interim.   SignedTruitt Merle, NP  05/14/2020 7:37 AM  [facility] [address] [place], [place]  [address] Phone: [phone] Fax: [phone]

## 2020-05-14 NOTE — Progress Notes (Signed)
[address]       [place] [address]             [facility] OUTPATIENT REPORT  Referring Provider is Dorothyann Peng, NP Primary Cardiologist is No primary care provider on file. PCP is Dorothyann Peng, NP  Chief Complaint  Patient presents with  . Coronary Artery Disease    eval for REDO CABG...had CABG in 1991 Dr. Merleen Nicely, CVTS...CATH 05/13/20    HPI:  Pleasant 74 year old gentleman is referred for consideration of redo CABG.  He had previously undergone bypass grafting in 1991 by Dr. Redmond Pulling and did well for several years.  He underwent stent grafting of his right coronary artery graft approximately 5 years ago and has done well since.  More recently, he has experienced shortness of breath and vague chest pain when exerting himself on his bicycle.  He denies resting chest pain or other typical symptoms of recurrent angina.  Given the suspicion of the symptoms he was taken to the Cath Lab yesterday which showed recurrent disease of the vein graft previously stented, mild diagonal disease distal to the vein graft, patent LIMA to LAD, and previously unrecognized circumflex disease.  He has preserved left ventricular function.  He is referred for consideration of CABG.  Past Medical History:  Diagnosis Date  . AAA (abdominal aortic aneurysm) ([place])   . Adjustment disorder with depressed mood 03/04/2009  . Arthritis   . Colon polyps   . CORONARY ARTERY DISEASE 07/10/2007  . Depression   . ERECTILE DYSFUNCTION 07/10/2007  . Extrinsic asthma, unspecified 09/10/2008  . HYPERLIPIDEMIA 07/10/2007  . HYPERTENSION 07/10/2007  . Myocardial infarction [facility]) 2016   when had angioplasty- was told had a silent MI  . TOBACCO USE 12/15/2009    Past Surgical History:  Procedure Laterality Date  . COLONOSCOPY    . CORONARY ANGIOPLASTY  12/2014  . CORONARY ARTERY BYPASS GRAFT  11/15/89   5 vessels  . HYDROCELE EXCISION / REPAIR  2011  .  INGUINAL HERNIA REPAIR Left 1985  . LEFT HEART CATH AND CORS/GRAFTS ANGIOGRAPHY N/A 05/13/2020   Procedure: LEFT HEART CATH AND CORS/GRAFTS ANGIOGRAPHY;  Surgeon: Belva Crome, MD;  Location: [facility];  Service: Cardiovascular;  Laterality: N/A;  . TOTAL HIP ARTHROPLASTY Right 04/19/2018   Procedure: RIGHT TOTAL HIP ARTHROPLASTY ANTERIOR APPROACH;  Surgeon: Gaynelle Arabian, MD;  Location: [facility];  Service: Orthopedics;  Laterality: Right;    Family History  Problem Relation Age of Onset  . Coronary artery disease Father        cabg  . Cancer Mother        Breast and or lung   . Depression Mother   . Hyperlipidemia Other   . Hypertension Other   . Depression Paternal Grandfather   . Colon cancer Neg Hx     Social History   Socioeconomic History  . Marital status: Married    Spouse name: Not on file  . Number of children: Not on file  . Years of education: Not on file  . Highest education level: Not on file  Occupational History  . Not on file  Tobacco Use  . Smoking status: Former Smoker    Packs/day: 0.50    Quit date: 01/13/2010    Years since quitting: 10.3  . Smokeless tobacco: Never Used  Vaping Use  . Vaping Use: Never used  Substance and Sexual Activity  .  Alcohol use: Yes    Alcohol/week: 5.0 standard drinks    Types: 5 Cans of beer per week  . Drug use: No  . Sexual activity: Yes    Comment: regular exercise - yes  Other Topics Concern  . Not on file  Social History Narrative   Retired from being an Financial controller of a small business    Married    No kids       He likes to play golf and rides bike       Social Determinants of Radio broadcast assistant Strain:   . Difficulty of Paying Living Expenses:   Food Insecurity:   . Worried About Charity fundraiser in the Last Year:   . Arboriculturist in the Last Year:   Transportation Needs:   . Film/video editor (Medical):   Marland Kitchen Lack of Transportation (Non-Medical):   Physical Activity:   . Days  of Exercise per Week:   . Minutes of Exercise per Session:   Stress:   . Feeling of Stress :   Social Connections:   . Frequency of Communication with Friends and Family:   . Frequency of Social Gatherings with Friends and Family:   . Attends Religious Services:   . Active Member of Clubs or Organizations:   . Attends Archivist Meetings:   Marland Kitchen Marital Status:   Intimate Partner Violence:   . Fear of Current or Ex-Partner:   . Emotionally Abused:   Marland Kitchen Physically Abused:   . Sexually Abused:     Current Outpatient Medications  Medication Sig Dispense Refill  . acetaminophen (TYLENOL) 325 MG tablet Take 650 mg by mouth every 6 (six) hours as needed for moderate pain or headache.     Marland Kitchen atorvastatin (LIPITOR) 80 MG tablet TAKE 1 TABLET DAILY (Patient taking differently: Take 80 mg by mouth daily. ) 90 tablet 3  . buPROPion (WELLBUTRIN XL) 150 MG 24 hr tablet Take 1 tablet (150 mg total) by mouth daily. (Patient taking differently: Take 300 mg by mouth daily. ) 90 tablet 3  . calcipotriene (DOVONOX) 0.005 % cream Apply 1 application topically 2 (two) times daily.    . clobetasol cream (TEMOVATE) 3.61 % Apply 1 application topically daily.    . clopidogrel (PLAVIX) 75 MG tablet Take 1 tablet (75 mg total) by mouth daily. 90 tablet 3  . isosorbide mononitrate (IMDUR) 30 MG 24 hr tablet Take 1 tablet (30 mg total) by mouth daily. 30 tablet 11  . lisinopril-hydrochlorothiazide (ZESTORETIC) 20-12.5 MG tablet Take 1 tablet by mouth daily. 90 tablet 3  . Melatonin 10 MG TABS Take 20 mg by mouth at bedtime.    . metoprolol tartrate (LOPRESSOR) 25 MG tablet Take 1 tablet (25 mg total) by mouth 2 (two) times daily. 180 tablet 3  . Multiple Vitamin (MULTIVITAMIN) capsule Take 1 capsule by mouth daily.      . naproxen sodium (ALEVE) 220 MG tablet Take 220 mg by mouth daily as needed (pain).     . nitroGLYCERIN (NITROSTAT) 0.4 MG SL tablet Place 1 tablet (0.4 mg total) under the tongue every 5  (five) minutes as needed. 25 tablet 0  . sildenafil (VIAGRA) 100 MG tablet Take 100 mg by mouth daily as needed for erectile dysfunction.    . traZODone (DESYREL) 50 MG tablet Take 1 tablet (50 mg total) by mouth at bedtime as needed for sleep. 90 tablet 1   No current facility-administered medications for  this visit.    No Known Allergies    Review of Systems:   General:  7 pound weight loss,   Cardiac:  As per HPI  Respiratory:  Shortness of breath with exertion  GI:   Hiatal hernia  GU:   Negative  Vascular:  History of abdominal aortic aneurysm  Neuro:   Negative  Musculoskeletal:  arthritis   Skin:     Psych:   History of depression,    Eyes:    wears glasses or contacts  ENT:   Last saw dentist February '21  Hematologic:  History of easy bruising   Endocrine:  No diabetes,      Physical Exam:   BP 127/66 (BP Location: Right Arm, Patient Position: Sitting, Cuff Size: Normal)   Pulse (!) 52   Temp 97.7 F (36.5 C)   Resp 16   Ht 5\' 10"  (1.778 m)   Wt 104.3 kg   SpO2 95% Comment: RA  BMI 33.00 kg/m   General:  well-appearing  HEENT:  Unremarkable   Neck:   no JVD, no bruits, no adenopathy    Chest:   clear to auscultation, symmetrical breath sounds, no wheezes, no rhonchi   CV:   RRR, no murmur   Abdomen:  soft, non-tender, no masses    Extremities:  warm, well-perfused, pulses intact, no LE edema  Rectal/GU  Deferred  Neuro:   Grossly non-focal and symmetrical throughout  Skin:   Clean and dry, no rashes, no breakdown   Diagnostic Tests:  I have personally reviewed his left heart catheterization from 05/13/20 and agree with the interpretation.  He has a patent left internal mammary artery to LAD, diseased vein graft to the right coronary artery, and native disease of his circumflex vessels.   Impression:  74 year old gentleman with recurrent coronary artery disease causing exercise intolerance and lifestyle limitation.  His anatomy is certainly amenable  to redo coronary artery bypass grafting, and this may be preferred in regards to durability compared with other treatment options.   Plan:  The patient will discuss his treatment options with his wife.  He will call us back once he has made a decision.   I spent in excess of 45 minutes during the conduct of this office consultation and >50% of this time involved direct face-to-face encounter with the patient for counseling and/or coordination of their care.          Level 3 Office Consult = 40 minutes         Level 4 Office Consult = 60 minutes         Level 5 Office Consult = 80 minutes  B. Murvin Natal, MD 05/14/2020 7:09 PM

## 2020-05-15 NOTE — Telephone Encounter (Signed)
I have talked with Mr. Moronta - he is asking for 2nd opinion.   This has been arranged for tomorrow with [facility] - their office will reach out to him with time.   [name], RN, [facility] [address] [place], [place]  [address] [phone]

## 2020-05-16 ENCOUNTER — Other Ambulatory Visit: Payer: Self-pay | Admitting: *Deleted

## 2020-05-16 ENCOUNTER — Other Ambulatory Visit: Payer: Self-pay

## 2020-05-16 ENCOUNTER — Institutional Professional Consult (permissible substitution): Payer: Medicare HMO | Admitting: Cardiothoracic Surgery

## 2020-05-16 ENCOUNTER — Encounter: Payer: Self-pay | Admitting: Cardiothoracic Surgery

## 2020-05-16 VITALS — BP 154/72 | HR 51 | Temp 98.4°F | Resp 18 | Ht 70.0 in | Wt 231.0 lb

## 2020-05-16 DIAGNOSIS — I2510 Atherosclerotic heart disease of native coronary artery without angina pectoris: Secondary | ICD-10-CM | POA: Diagnosis not present

## 2020-05-16 NOTE — Patient Instructions (Signed)
Stop Plavix July 6 Resume 81 mg one a day  Aspirin on July 10 Stop Lisinopril after dose the morning of July 11 No Viagra  No Aleve    Coronary Artery Bypass Grafting  Coronary artery bypass grafting (CABG) is a surgery to bypassor to fix arteries of the heart (coronary arteries) that are narrow or blocked. This narrowing is usually the result of a buildup of fatty deposits (plaques) in the walls of the vessels. The coronary arteries supply the heart with the oxygen and nutrients that it needs to pump blood through your body. In this surgery, a section of blood vessel from another part of the body (usually the chest, arm, or leg) is removed and then placed where it will allow blood to flow around the damaged part of the coronary artery. The new section of blood vessel is called the graft. Tell a health care provider about:  Any allergies you have.  All medicines you are taking or using, including steroids, blood thinners, vitamins, herbs, eye drops, creams, and over-the-counter medicines.  Any problems you or family members have had with anesthetic medicines.  Any blood disorders you have.  Any surgeries you have had.  Any medical conditions you have.  Whether you are pregnant or may be pregnant. What are the risks? Generally, this is a safe procedure. However, problems may occur, including:  Bleeding, which may require transfusions.  Infection.  Allergic reactions to medicines or dyes.  Pain at the surgical site.  Damage to other structures or organs.  Short-term memory loss, confusion, and personality changes.  Heart rhythm problems (arrhythmias).  Stroke.  Heart attack during or after surgery.  Kidney failure. What happens before the procedure? Staying hydrated Follow instructions from your health care provider about hydration, which may include:  Up to 2 hours before the procedure - you may continue to drink clear liquids, such as water, clear fruit juice,  black coffee, and plain tea.  Eating and drinking Follow instructions from your health care provider about eating and drinking, which may include:  8 hours before the procedure - stop eating heavy meals or foods, such as meat, fried foods, or fatty foods.  6 hours before the procedure - stop eating light meals or foods, such as toast or cereal.  6 hours before the procedure - stop drinking milk or drinks that contain milk.  2 hours before the procedure - stop drinking clear liquids. Medicines  Take over-the-counter and prescription medicines only as told by your health care provider.  Ask your health care provider about: ? Changing or stopping your regular medicines. This is especially important if you are taking diabetes medicines or blood thinners. You may be asked to start new medicines and stop taking others. Do not stop medicines or adjust dosages on your own. ? Taking medicines such as aspirin and ibuprofen. These medicines can thin your blood. Do not take these medicines unless your health care provider tells you to take them. ? Taking over-the-counter medicines, vitamins, herbs, and supplements. General instructions  Ask your health care provider: ? How your surgical site will be marked or identified. ? What steps will be taken to help prevent infection. These may include:  Removing hair at the surgery site.  Washing skin with a germ-killing soap.  Taking antibiotic medicine.  You may be asked to shower with a germ-killing soap.  For 3-6 weeks before the procedure, do not use any products that contain nicotine or tobacco, such as cigarettes, e-cigarettes, and  chewing tobacco. Quitting smoking is one of the best things you can do for your heart health. If you need help quitting, ask your health care provider.  Talk with your health care provider about where the grafts will be taken from for your surgery. What happens during the procedure?  An IV will be inserted into one  of your veins.  You will be given one or more of the following: ? A medicine to help you relax (sedative). ? A medicine to make you fall asleep (general anesthetic).  An incision will be made down the front of the chest through the breastbone (sternum). The sternum will be opened so the surgeon can see the heart.  You may or may not be placed on a heart-lung bypass machine. If this machine is used, your heart will be temporarily stopped. The machine will provide oxygen to your blood while the surgeon works on your heart. If the machine is not used, it is called beating heart bypass surgery.  A section of blood vessel will be removed from another part of your body (usually the chest, arm, or leg).  The blood vessel will be attached above and below the blocked artery of your heart. This may be done on more than one artery of the heart.  When the bypass is done, you will be taken off the heart-lung machine if it was used.  If your heart was stopped, it will be restarted and will take over again normally.  Your chest will be closed with special surgical wire to hold your bones together for healing.  Your incision(s) will be closed with stitches (sutures), skin glue, or adhesive strips.  Bandages (dressings) will be placed over the incision(s).  Tubes will remain in your chest and will be connected to a suction device to help drain fluid and reinflate the lungs. The procedure may vary among health care providers and hospitals. What happens after the procedure?  Your blood pressure, heart rate, breathing rate, and blood oxygen level will be monitored until you leave the hospital.  You may wake up with a tube in your throat to help your breathing. You may be connected to a breathing machine. You will not be able to talk while the tube is in place. The tube will be taken out as soon as it is safe.  You will be groggy and may have some pain. You will be given pain medicine to help control the  pain.  You may be in the intensive care unit for 1-2 days.  You may be given oxygen to help you breathe.  You will be shown how to do deep breathing exercises.  You may have to wear compression stockings. These stockings help to prevent blood clots and reduce swelling in your legs.  You may be given new medicines to take after your surgery.  Cardiac rehabilitation will be started while you are in the hospital. This may include education and exercises to help you recover from your surgery. Summary  In this procedure, a section of blood vessel from another part of the body (usually the chest, arm, or leg) is removed and then placed where it will allow blood to bypass the narrowed or blocked part of the coronary artery.  For 3-6 weeks before the procedure, do not use any products that contain nicotine or tobacco, such as cigarettes, e-cigarettes, and chewing tobacco. If you need help quitting, ask your health care provider.  You may or may not be placed on a  heart-lung bypass machine during the surgery. If used, this machine will provide oxygen to your blood while the surgeon works on your heart.  You may wake up with a tube in your throat to help your breathing. You may be connected to a breathing machine. The tube will be taken out as soon as it is safe. This information is not intended to replace advice given to you by your health care provider. Make sure you discuss any questions you have with your health care provider. Document Revised: 07/11/2018 Document Reviewed: 07/11/2018 Elsevier Patient Education  [place].   Coronary Artery Bypass Grafting, Care After This sheet gives you information about how to care for yourself after your procedure. Your health care provider may also give you more specific instructions. If you have problems or questions, contact your health care provider. What can I expect after the procedure? After the procedure, it is common to  have:  Nausea.  Lack of appetite.  Constipation.  Weakness and fatigue.  Depression or irritability.  Pain or discomfort in your incision areas. Follow these instructions at home: Medicines  Take over-the-counter and prescription medicines only as told by your health care provider. Do not stop taking medicines or start any new medicines without approval from your health care provider.  If you were prescribed an antibiotic medicine, take it as told by your health care provider. Do not stop using the antibiotic even if you start to feel better. Incision care   Follow instructions from your health care provider about how to take care of your incisions. Make sure you: ? Wash your hands with soap and water before and after you change your bandage (dressing). If soap and water are not available, use hand sanitizer. ? Change your dressing as told by your health care provider. ? Leave stitches (sutures), skin glue, or adhesive strips in place. These skin closures may need to stay in place for 2 weeks or longer. If adhesive strip edges start to loosen and curl up, you may trim the loose edges. Do not remove adhesive strips completely unless your health care provider tells you to do that.  Keep incision areas clean, dry, and protected.  Check your incision areas every day for signs of infection. Check for: ? More redness, swelling, or pain. ? More fluid or blood. ? Warmth. ? Pus or a bad smell.  If incisions were made in your legs: ? Avoid crossing your legs. ? Avoid sitting for long periods of time. Change positions every 30 minutes. ? Raise (elevate) your legs when you are sitting. Bathing  Do not take baths, swim, or use a hot tub until your health care provider approves.  Only take sponge baths. Pat the incisions dry. Do not rub incisions with a washcloth or towel.  Ask your health care provider when you can shower. Eating and drinking   Eat foods that are high in fiber,  such as beans, nuts, whole grains, and raw fruits and vegetables. Meats, if eaten, should be lean cut. Avoid canned, processed, and fried foods. This can help prevent constipation and is a recommended part of a heart-healthy diet.  Drink enough fluid to keep your urine pale yellow.  Do not drink alcohol until your recovery is complete. Ask your health care provider when it is safe to drink alcohol. Activity  Rest and limit your activity as told by your health care provider. You may be instructed to: ? Stop any activity right away if you have chest  pain, shortness of breath, irregular heartbeats, or dizziness. Get help right away if you have any of these symptoms. ? Move around frequently for short periods or take short walks as told by your health care provider. Gradually increase your activities. ? Avoid lifting, pushing, or pulling anything that is heavier than 10 lb (4.5 kg) for at least 6 weeks or as told by your health care provider.  Participate in physical therapy or a cardiac rehabilitation program as told by your health care provider. ? Physical therapy involves doing exercises to maintain movement, strengthen your muscles, and build your endurance. ? A cardiac rehabilitation program is a treatment program to improve your health and well-being through exercise training, education, and counseling.  Do not drive until your health care provider approves.  Ask your health care provider when you may return to work.  Ask your health care provider when you may resume sexual activity. General instructions  Do not drive or use heavy machinery while taking prescription pain medicine.  Do not use any products that contain nicotine or tobacco, such as cigarettes, e-cigarettes, and chewing tobacco. If you need help quitting, ask your health care provider.  Take 2-3 deep breaths every few hours during the day, while you recover. This helps expand your lungs and prevent complications like  pneumonia after surgery.  If you were given a device called an incentive spirometer, use it several times a day to practice deep breathing. Support your chest with a pillow or your arms when you take deep breaths or cough.  Wear compression stockings as told by your health care provider. These stockings help to prevent blood clots and reduce swelling in your legs.  Weigh yourself every day. This helps identify if your body is holding (retaining) fluid that may make your heart and lungs work harder.  Keep all follow-up visits as told by your health care provider. This is important. Contact a health care provider if:  You have more redness, swelling, or pain around any incision.  You have more fluid or blood coming from any incision.  Any incision feels warm to the touch.  You have pus or a bad smell coming from any incision.  You have a fever.  You have swelling in your ankles or legs.  You have pain in your legs.  You gain 2 lb (0.9 kg) or more a day.  You are nauseous or you vomit.  You have diarrhea. Get help right away if:  You have chest pain that spreads to your jaw or arms.  You are short of breath.  You have a fast or irregular heartbeat.  You notice a "clicking" in your breastbone (sternum) when you move.  You have any symptoms of a stroke. "BE FAST" is an easy way to remember the main warning signs of a stroke: ? B - Balance. Signs are dizziness, sudden trouble walking, or loss of balance. ? E - Eyes. Signs are trouble seeing or a sudden change in vision. ? F - Face. Signs are sudden weakness or numbness of the face, or the face or eyelid drooping on one side. ? A - Arms. Signs are weakness or numbness in an arm. This happens suddenly and usually on one side of the body. ? S - Speech. Signs are sudden trouble speaking, slurred speech, or trouble understanding what people say. ? T - Time. Time to call emergency services. Write down what time symptoms  started.  You have other signs of a stroke, such as: ?  A sudden, severe headache with no known cause. ? Nausea or vomiting. ? Seizure. These symptoms may represent a serious problem that is an emergency. Do not wait to see if the symptoms will go away. Get medical help right away. Call your local emergency services (911 in the [place] Do not drive yourself to the hospital. Summary  After the procedure, it is common to have pain or discomfort in the incision areas.  Do not take baths, swim, or use a hot tub until your health care provider approves.  Gradually increase your activities. You may need physical therapy or cardiac rehabilitation to help strengthen your muscles and build your endurance.  Weigh yourself every day. This helps identify if your body is holding (retaining) fluid that may make your heart and lungs work harder. This information is not intended to replace advice given to you by your health care provider. Make sure you discuss any questions you have with your health care provider. Document Revised: 07/11/2018 Document Reviewed: 07/11/2018 Elsevier Patient Education  2020 [employer].

## 2020-05-16 NOTE — Progress Notes (Signed)
[address]       ,[place] [address]             [phone]                    [name] Medical Record #[id number] Date of Birth: 10/16/1946  Referring: [name], NP Primary Care: Dorothyann Peng, NP Primary Cardiologist: No primary care provider on file.  Chief Complaint:    Chief Complaint  Patient presents with  . Coronary Artery Disease    new patient consulation, redo CABG    History of Present Illness:    Dave Robles 74 y.o. male is seen in the office  today for second opinion regarding redo coronary artery bypass grafting.  The patient recently underwent outpatient cardiac catheterization Dr. Tamala Julian.  This was precipitated by increasing dyspnea on exertion.  This is usually brought on with riding his bicycle especially going uphill.. He has a long history of known coronary artery disease.  He developed unstable angina in August 1991 while traveling, returned home and ultimately underwent coronary artery bypass grafting x3 by Dr. Redmond Pulling with the left internal mammary to the left anterior descending coronary artery reverse saphenous vein graft to the diagonal) reverse saphenous vein graft to the distal right coronary artery with vein harvest from the left lower leg.  Patient went 25 years with no cardiac symptoms.  5 years ago while in [place] he was admitted to the hospital diagnosed with myocardial infarction based on enzymes, repeat catheterization and a stent placed in the proximal right coronary artery graft.   Is been maintained on statin, Plavix, and 81 mg of aspirin.  Last year his aspirin was discontinued because of increased bruising.  Because of recurrent anginal equivalent symptoms primarily dyspnea on exertion the patient underwent repeat cardiac catheterization by Dr. Tamala Julian demonstrating preserved LV function, in-stent stenosis of the stent in the proximal right coronary artery, progression of disease in the proximal  circumflex and diagonal.  The mammary artery is large and patent with collateral filling to the right.     Patient has no history of diabetes, he was a smoker until his bypass surgery in 1991 and has not smoked since.  Current Activity/ Functional Status:  Patient is independent with mobility/ambulation, transfers, ADL's, IADL's.   Zubrod Score: At the time of surgery this patient's most appropriate activity status/level should be described as: []     0    Normal activity, no symptoms [x]     1    Restricted in physical strenuous activity but ambulatory, able to do out light work []     2    Ambulatory and capable of self care, unable to do work activities, up and about               >50 % of waking hours                              []     3    Only limited self care, in bed greater than 50% of waking hours []     4    Completely disabled, no self care, confined to bed or chair []     5    Moribund   Past Medical History:  Diagnosis Date  . AAA (abdominal aortic aneurysm) ([place])   . Adjustment disorder with depressed mood 03/04/2009  . Arthritis   .  Colon polyps   . CORONARY ARTERY DISEASE 07/10/2007  . Depression   . ERECTILE DYSFUNCTION 07/10/2007  . Extrinsic asthma, unspecified 09/10/2008  . HYPERLIPIDEMIA 07/10/2007  . HYPERTENSION 07/10/2007  . Myocardial infarction [facility]) 2016   when had angioplasty- was told had a silent MI  . TOBACCO USE 12/15/2009    Past Surgical History:  Procedure Laterality Date  . COLONOSCOPY    . CORONARY ANGIOPLASTY  12/2014  . CORONARY ARTERY BYPASS GRAFT  11/15/89   5 vessels  . HYDROCELE EXCISION / REPAIR  2011  . INGUINAL HERNIA REPAIR Left 1985  . LEFT HEART CATH AND CORS/GRAFTS ANGIOGRAPHY N/A 05/13/2020   Procedure: LEFT HEART CATH AND CORS/GRAFTS ANGIOGRAPHY;  Surgeon: Belva Crome, MD;  Location: [facility];  Service: Cardiovascular;  Laterality: N/A;  . TOTAL HIP ARTHROPLASTY Right 04/19/2018   Procedure: RIGHT TOTAL HIP ARTHROPLASTY  ANTERIOR APPROACH;  Surgeon: Gaynelle Arabian, MD;  Location: [facility];  Service: Orthopedics;  Laterality: Right;    Family History  Problem Relation Age of Onset  . Coronary artery disease Father        cabg  . Cancer Mother        Breast and or lung   . Depression Mother   . Hyperlipidemia Other   . Hypertension Other   . Depression Paternal Grandfather   . Colon cancer Neg Hx    Family history is significant for his father who had bypass surgery in 31 at age 70 died in the postop.  Temporal [facility]  Patient's mother died of lung cancer and had history of breast cancer He has 1 brother 1 sister neither who have cardiac disease he has no children  Social History   Tobacco Use  Smoking Status Former Smoker  . Packs/day: 0.50  . Quit date: 38  . Years since quitting: 30.5  Smokeless Tobacco Never Used    Social History   Substance and Sexual Activity  Alcohol Use Yes  . Alcohol/week: 5.0 standard drinks  . Types: 5 Cans of beer per week     No Known Allergies  Current Outpatient Medications  Medication Sig Dispense Refill  . acetaminophen (TYLENOL) 325 MG tablet Take 650 mg by mouth every 6 (six) hours as needed for moderate pain or headache.     Marland Kitchen atorvastatin (LIPITOR) 80 MG tablet TAKE 1 TABLET DAILY (Patient taking differently: Take 80 mg by mouth daily. ) 90 tablet 3  . calcipotriene (DOVONOX) 0.005 % cream Apply 1 application topically 2 (two) times daily.    . clobetasol cream (TEMOVATE) 7.93 % Apply 1 application topically daily.    . clopidogrel (PLAVIX) 75 MG tablet Take 1 tablet (75 mg total) by mouth daily. 90 tablet 3  . isosorbide mononitrate (IMDUR) 30 MG 24 hr tablet Take 1 tablet (30 mg total) by mouth daily. 30 tablet 11  . lisinopril-hydrochlorothiazide (ZESTORETIC) 20-12.5 MG tablet Take 1 tablet by mouth daily. 90 tablet 3  . Melatonin 10 MG TABS Take 20 mg by mouth at bedtime.    . metoprolol tartrate (LOPRESSOR) 25 MG tablet Take 1  tablet (25 mg total) by mouth 2 (two) times daily. 180 tablet 3  . Multiple Vitamin (MULTIVITAMIN) capsule Take 1 capsule by mouth daily.      . naproxen sodium (ALEVE) 220 MG tablet Take 220 mg by mouth daily as needed (pain).     . nitroGLYCERIN (NITROSTAT) 0.4 MG SL tablet Place 1 tablet (0.4 mg total)  under the tongue every 5 (five) minutes as needed. 25 tablet 0  . sildenafil (VIAGRA) 100 MG tablet Take 100 mg by mouth daily as needed for erectile dysfunction.    Marland Kitchen buPROPion (WELLBUTRIN XL) 150 MG 24 hr tablet Take 1 tablet (150 mg total) by mouth daily. (Patient taking differently: Take 300 mg by mouth daily. ) 90 tablet 3  . traZODone (DESYREL) 50 MG tablet Take 1 tablet (50 mg total) by mouth at bedtime as needed for sleep. 90 tablet 1   No current facility-administered medications for this visit.    Pertinent items are noted in HPI.   Review of Systems:     Cardiac Review of Systems: [Y] = yes  or   [ N ] = no   Chest Pain [  n  ]  Resting SOB [ n  ] Exertional SOB  [ y ]  Vertell Limber Florencio.Farrier  ]   Pedal Edema [ n  ]    Palpitations [ n ] Syncope  [  n]   Presyncope [  n ]   General Review of Systems: [Y] = yes [  ]=no Constitional: recent weight change [5-7 lbs  ];  Wt loss over the last 3 months [   ] anorexia [  ]; fatigue [ y ]; nausea [  ]; night sweats [  ]; fever [  ]; or chills [  ];           Eye : blurred vision [  ]; diplopia [   ]; vision changes [  ];  Amaurosis fugax[  ]; Resp: cough [  ];  wheezing[  ];  hemoptysis[  ]; shortness of breath[y  ]; paroxysmal nocturnal dyspnea[  ]; dyspnea on exertion[ y ]; or orthopnea[  ];  GI:  gallstones[  ], vomiting[  ];  dysphagia[  ]; melena[  ];  hematochezia [  ]; heartburn[  ];   Hx of  Colonoscopy[  ]; GU: kidney stones [  ]; hematuria[  ];   dysuria [  ];  nocturia[  ];  history of     obstruction [  ]; urinary frequency [  ]             Skin: rash, swelling[  ];, hair loss[  ];  peripheral edema[  ];  or itching[  ];  Musculosketetal: myalgias[  ];  joint swelling[  ];  joint erythema[  ];  joint pain[  ];  back pain[  ];  Heme/Lymph: bruising[  ];  bleeding[  ];  anemia[  ];  Neuro: TIA[  ];  headaches[  ];  stroke[  ];  vertigo[  ];  seizures[  ];   paresthesias[  ];  difficulty walking[  ];  Psych:depression[  ]; anxiety[  ];  Endocrine: diabetes[ n ];  thyroid dysfunction[ n ];  Immunizations: Flu up to date Blue.Reese  ]; Pneumococcal up to date [y  ];covid x2   Other:     PHYSICAL EXAMINATION: BP (!) 154/72 (BP Location: Left Arm, Patient Position: Sitting, Cuff Size: Normal)   Pulse (!) 51   Temp 98.4 F (36.9 C)   Resp 18   Ht 5\' 10"  (1.778 m)   Wt 231 lb (104.8 kg)   SpO2 94% Comment: RA  BMI 33.15 kg/m  General appearance: alert, cooperative, appears stated age and no distress Head: Normocephalic, without obvious abnormality, atraumatic Neck: no adenopathy, no carotid bruit, no JVD, supple, symmetrical, trachea midline and  thyroid not enlarged, symmetric, no tenderness/mass/nodules Lymph nodes: Cervical, supraclavicular, and axillary nodes normal. Resp: clear to auscultation bilaterally Back: symmetric, no curvature. ROM normal. No CVA tenderness. Cardio: regular rate and rhythm, S1, S2 normal, no murmur, click, rub or gallop GI: soft, non-tender; bowel sounds normal; no masses,  no organomegaly Extremities: extremities normal, atraumatic, no cyanosis or edema, Homans sign is negative, no sign of DVT and Vein harvest site left lower leg Neurologic: Grossly normal Positive bilateral modified Allen's test Right chest site with some superficial bruising no palpable hematoma-dressings were removed  Diagnostic Studies & Laboratory data:     Recent Radiology Findings:   DG Chest 2 View  Result Date: 04/30/2020 CLINICAL DATA:  74 year old male with history of dyspnea on exertion. Productive cough and shortness of breath for the past 10 days. EXAM: CHEST - 2 VIEW COMPARISON:  Chest x-ray  11/25/2015. FINDINGS: Lung volumes are normal. No consolidative airspace disease. No pleural effusions. No pneumothorax. Small calcified granulomas in the right lung. No no other suspicious appearing pulmonary nodule or mass noted. Pulmonary vasculature and the cardiomediastinal silhouette are within normal limits. Atherosclerosis in the thoracic aorta. Status post median sternotomy for CABG. IMPRESSION: 1.  No radiographic evidence of acute cardiopulmonary disease. 2. Aortic atherosclerosis. Electronically Signed   By: Vinnie Langton M.D.   On: 04/30/2020 15:05   CT Chest Wo Contrast  Result Date: 05/14/2020 CLINICAL DATA:  Chest pain.  Coronary artery disease.  CABG. EXAM: CT CHEST WITHOUT CONTRAST TECHNIQUE: Multidetector CT imaging of the chest was performed following the standard protocol without IV contrast. COMPARISON:  Chest x-ray dated 04/30/2020 FINDINGS: Cardiovascular: Aortic atherosclerosis. Coronary artery calcifications. CABG. Heart size is normal. No pericardial effusion. Mediastinum/Nodes: No enlarged mediastinal or axillary lymph nodes. Thyroid gland, trachea, and esophagus demonstrate no significant findings. Lungs/Pleura: None mm calcified granuloma anteromedial aspect of the right upper lobe. The lungs are otherwise clear. No effusions. Upper Abdomen: No acute abnormality. Musculoskeletal: Osteophytes fuse the thoracic spine from T2 through L1. Does the patient have ankylosing spondylitis? IMPRESSION: 1. No acute abnormalities of the chest. 2. Aortic atherosclerosis. 3. Osteophytes fuse the thoracic spine from T2 through L1. Does the patient have ankylosing spondylitis. Aortic Atherosclerosis (ICD10-I70.0). Electronically Signed   By: Lorriane Shire M.D.   On: 05/14/2020 12:36   CARDIAC CATHETERIZATION  Result Date: 05/13/2020  Total occlusion of the mid LAD, total occlusion of the mid RCA, and high-grade calcified obstruction in the ostial to proximal circumflex with diffuse 50% left  main stenosis.  Patent saphenous vein graft to the diagonal with 70 to 80% stenosis distal to the graft insertion site.  The diagonal fills retrogradely into the first septal perforator and the proximal LAD segment.  Diffuse 95% in-stent restenosis in the ostial to proximal saphenous vein graft to the distal RCA.  Normal LV function.  LVEDP is normal.  EF is estimated to be 60%. RECOMMENDATIONS:  Critical decision point for this patient who had coronary bypass surgery in 1991, and now has diffuse in-stent restenosis in the ostium and proximal segment of the saphenous vein graft to the right coronary.  The native circumflex is heavily calcified and has critical stenosis.  The diagonal beyond the saphenous vein graft insertion site has significant disease.  Long-term patency of the right coronary graft will be markedly reduced PCI and restenting.  The technical standpoint it appears that the stent extends outside the ostium of the bypass graft.  The right coronary territory is large.  The  patient is relatively healthy.  His best long-term approach may be repeat coronary bypass surgery.  Before starting down the path of stent implantation, will  refer for heart team evaluation to consider surgical revascularization.  If deemed not a surgical candidate or if the patient prefers PCI, this could be attempted but will carry higher technical risk and likelihood of repeat revascularization.    I have independently reviewed the above radiology studies  and reviewed the findings with the patient.   Recent Lab Findings: Lab Results  Component Value Date   WBC 8.1 05/06/2020   HGB 16.5 05/06/2020   HCT 47.3 05/06/2020   PLT 172 05/06/2020   GLUCOSE 108 (H) 05/06/2020   CHOL 119 01/10/2020   TRIG 90.0 01/10/2020   HDL 28.90 (L) 01/10/2020   LDLDIRECT 66.7 11/30/2011   LDLCALC 72 01/10/2020   ALT 32 05/06/2020   AST 27 05/06/2020   NA 142 05/06/2020   K 4.1 05/06/2020   CL 103 05/06/2020   CREATININE  1.26 05/06/2020   BUN 14 05/06/2020   CO2 27 05/06/2020   TSH 2.57 01/10/2020   INR 1.06 04/11/2018   CATH: Diagnostic Dominance: Right Left Main  Ost LM to Dist LM lesion is 50% stenosed.  Left Anterior Descending  Ost LAD to Prox LAD lesion is 80% stenosed.  Prox LAD to Mid LAD lesion is 100% stenosed.  First Diagonal Branch  1st Diag lesion is 75% stenosed.  Left Circumflex  Ost Cx to Prox Cx lesion is 95% stenosed.  First Obtuse Marginal Branch  Vessel is small in size.  Second Obtuse Marginal Branch  Vessel is large in size.  Right Coronary Artery  Prox RCA to Mid RCA lesion is 100% stenosed.  LIMA Graft To Dist LAD  Graft To 1st Diag  Graft To RPDA  Origin to Prox Graft lesion is 95% stenosed. The lesion was previously treated.      Assessment / Plan:   #1 significant native three-vessel coronary artery disease with total occlusion of the right coronary artery and LAD, with high-grade proximal circumflex #2 status post coronary artery bypass grafting times 01/31/1990, with patent left internal mammary to the LAD proximal disease in the vein graft to the right coronary artery status post stenting with recurrent in-stent stenosis and progression of disease and diagonal coronary artery #3 preserved LV function  With the patient is otherwise good functional status and health status redo coronary artery bypass grafting offers the best option for full revascularization and to avoid early restenosis trying to attempt repeat stent placement in the right coronary artery.  I discussed with the patient and his wife in detail the risks and options involved.  This included the risk of redo coronary artery bypass grafting, such as bleeding blood transfusion, myocardial infarction, stroke, possible death  The patient would like to proceed with redo coronary bypass grafting during the week of July 12, we we will hold his Plavix 5 days prior to surgery, resume low-dose aspirin,  preoperatively obtain echocardiogram and Dopplers and venous studies of the carotids and radial arteries.    Plan redo coronary artery bypass grafting on July 13th.    Grace Isaac MD      [address] [place], [address] Office [phone]     05/16/2020 11:19 AM

## 2020-05-18 ENCOUNTER — Encounter: Payer: Self-pay | Admitting: Adult Health

## 2020-05-20 ENCOUNTER — Other Ambulatory Visit: Payer: Self-pay | Admitting: Adult Health

## 2020-05-20 DIAGNOSIS — F339 Major depressive disorder, recurrent, unspecified: Secondary | ICD-10-CM

## 2020-05-20 MED ORDER — BUPROPION HCL ER (XL) 300 MG PO TB24: 300.0000 mg | ORAL_TABLET | Freq: Every day | ORAL | 1 refills | Status: DC

## 2020-05-22 NOTE — Progress Notes (Signed)
Your procedure is scheduled on Tuesday July 13.  Report to [facility] Main Entrance "A" at 05:30 A.M., and check in at the Admitting office.  Call this number if you have problems the morning of surgery: [phone]  Call [phone] if you have any questions prior to your surgery date Monday-Friday 8am-4pm   Remember: Do not eat or drink after midnight the night before your surgery    Take these medicines the morning of surgery with A SIP OF WATER: atorvastatin (LIPITOR) buPROPion (WELLBUTRIN XL) isosorbide mononitrate (IMDUR)  metoprolol tartrate (LOPRESSOR)  IF NEEDED:  acetaminophen (TYLENOL)  nitroGLYCERIN (NITROSTAT)   Follow your surgeon's instructions on when to stop clopidogrel (PLAVIX).  If no instructions were given by your surgeon then you will need to call the office to get those instructions.     As of today, STOP taking any Aspirin (unless otherwise instructed by your surgeon) and Aspirin containing products, Aleve, Naproxen, Ibuprofen, Motrin, Advil, Goody's, BC's, all herbal medications, fish oil, and all vitamins.    The Morning of Surgery  Do not wear jewelry  Do not wear lotions, powders, colognes, or deodorant Men may shave face and neck.  Do not bring valuables to the hospital.  [facility] is not responsible for any belongings or valuables.  If you are a smoker, DO NOT Smoke 24 hours prior to surgery  If you wear a CPAP at night please bring your mask the morning of surgery   Remember that you must have someone to transport you home after your surgery, and remain with you for 24 hours if you are discharged the same day.   Please bring cases for contacts, glasses, hearing aids, dentures or bridgework because it cannot be worn into surgery.    Leave your suitcase in the car.  After surgery it may be brought to your room.  For patients admitted to the hospital, discharge time will be determined by your treatment team.  Patients discharged  the day of surgery will not be allowed to drive home.    Special instructions:   [place]- Preparing For Surgery  Before surgery, you can play an important role. Because skin is not sterile, your skin needs to be as free of germs as possible. You can reduce the number of germs on your skin by washing with CHG (chlorahexidine gluconate) Soap before surgery.  CHG is an antiseptic cleaner which kills germs and bonds with the skin to continue killing germs even after washing.    Oral Hygiene is also important to reduce your risk of infection.  Remember - BRUSH YOUR TEETH THE MORNING OF SURGERY WITH YOUR REGULAR TOOTHPASTE  Please do not use if you have an allergy to CHG or antibacterial soaps. If your skin becomes reddened/irritated stop using the CHG.  Do not shave (including legs and underarms) for at least 48 hours prior to first CHG shower. It is OK to shave your face.  Please follow these instructions carefully.   1. Shower the NIGHT BEFORE SURGERY and the MORNING OF SURGERY with CHG Soap.   2. If you chose to wash your hair and body, wash as usual with your normal shampoo and body-wash/soap.  3. Rinse your hair and body thoroughly to remove the shampoo and soap.  4. Apply CHG directly to the skin (ONLY FROM THE NECK DOWN) and wash gently with a scrungie or a clean washcloth.   5. Do not use on open wounds or open sores. Avoid contact with your  eyes, ears, mouth and genitals (private parts). Wash Face and genitals (private parts)  with your normal soap.   6. Wash thoroughly, paying special attention to the area where your surgery will be performed.  7. Thoroughly rinse your body with warm water from the neck down.  8. DO NOT shower/wash with your normal soap after using and rinsing off the CHG Soap.  9. Pat yourself dry with a CLEAN TOWEL.  10. Wear CLEAN PAJAMAS to bed the night before surgery  11. Place CLEAN SHEETS on your bed the night of your first shower and DO NOT SLEEP  WITH PETS.  12. Wear comfortable clothes the morning of surgery.     Day of Surgery:  Please shower the morning of surgery with the CHG soap Do not apply any deodorants/lotions. Please wear clean clothes to the hospital/surgery center.   Remember to brush your teeth WITH YOUR REGULAR TOOTHPASTE.   Please read over the following fact sheets that you were given.

## 2020-05-23 ENCOUNTER — Ambulatory Visit (HOSPITAL_BASED_OUTPATIENT_CLINIC_OR_DEPARTMENT_OTHER)
Admission: RE | Admit: 2020-05-23 | Discharge: 2020-05-23 | Disposition: A | Discharge status: Home / Self Care | Payer: Medicare HMO | Source: Ambulatory Visit | Attending: Cardiothoracic Surgery | Admitting: Cardiothoracic Surgery

## 2020-05-23 ENCOUNTER — Ambulatory Visit (HOSPITAL_COMMUNITY)
Admission: RE | Admit: 2020-05-23 | Discharge: 2020-05-23 | Disposition: A | Discharge status: Home / Self Care | Payer: Medicare HMO | Source: Ambulatory Visit | Attending: Cardiothoracic Surgery | Admitting: Cardiothoracic Surgery

## 2020-05-23 ENCOUNTER — Other Ambulatory Visit (HOSPITAL_COMMUNITY)
Admission: RE | Admit: 2020-05-23 | Discharge: 2020-05-23 | Disposition: A | Discharge status: Home / Self Care | Payer: Medicare HMO | Source: Ambulatory Visit | Attending: Cardiothoracic Surgery | Admitting: Cardiothoracic Surgery

## 2020-05-23 ENCOUNTER — Other Ambulatory Visit: Payer: Self-pay

## 2020-05-23 ENCOUNTER — Encounter (HOSPITAL_COMMUNITY): Payer: Self-pay

## 2020-05-23 ENCOUNTER — Encounter (HOSPITAL_COMMUNITY)
Admission: RE | Admit: 2020-05-23 | Discharge: 2020-05-23 | Disposition: A | Discharge status: Home / Self Care | Payer: Medicare HMO | Source: Ambulatory Visit | Attending: Cardiothoracic Surgery | Admitting: Cardiothoracic Surgery

## 2020-05-23 DIAGNOSIS — I2510 Atherosclerotic heart disease of native coronary artery without angina pectoris: Secondary | ICD-10-CM

## 2020-05-23 DIAGNOSIS — Z20822 Contact with and (suspected) exposure to covid-19: Secondary | ICD-10-CM | POA: Diagnosis not present

## 2020-05-23 DIAGNOSIS — Z951 Presence of aortocoronary bypass graft: Secondary | ICD-10-CM | POA: Diagnosis not present

## 2020-05-23 DIAGNOSIS — E785 Hyperlipidemia, unspecified: Secondary | ICD-10-CM | POA: Insufficient documentation

## 2020-05-23 DIAGNOSIS — I10 Essential (primary) hypertension: Secondary | ICD-10-CM | POA: Insufficient documentation

## 2020-05-23 DIAGNOSIS — Z01818 Encounter for other preprocedural examination: Secondary | ICD-10-CM | POA: Insufficient documentation

## 2020-05-23 LAB — CBC
HCT: 46.8 % (ref 39.0–52.0)
Hemoglobin: 15.9 g/dL (ref 13.0–17.0)
MCH: 31.4 pg (ref 26.0–34.0)
MCHC: 34 g/dL (ref 30.0–36.0)
MCV: 92.3 fL (ref 80.0–100.0)
Platelets: 141 10*3/uL — ABNORMAL LOW (ref 150–400)
RBC: 5.07 MIL/uL (ref 4.22–5.81)
RDW: 13.1 % (ref 11.5–15.5)
WBC: 7.6 10*3/uL (ref 4.0–10.5)
nRBC: 0 % (ref 0.0–0.2)

## 2020-05-23 LAB — COMPREHENSIVE METABOLIC PANEL
ALT: 38 U/L (ref 0–44)
AST: 26 U/L (ref 15–41)
Albumin: 3.6 g/dL (ref 3.5–5.0)
Alkaline Phosphatase: 69 U/L (ref 38–126)
Anion gap: 9 (ref 5–15)
BUN: 12 mg/dL (ref 8–23)
CO2: 24 mmol/L (ref 22–32)
Calcium: 9.3 mg/dL (ref 8.9–10.3)
Chloride: 106 mmol/L (ref 98–111)
Creatinine, Ser: 1.2 mg/dL (ref 0.61–1.24)
GFR calc Af Amer: >60 mL/min (ref >60)
GFR calc non Af Amer: >60 mL/min (ref >60)
Glucose, Bld: 108 mg/dL — ABNORMAL HIGH (ref 70–99)
Potassium: 3.9 mmol/L (ref 3.5–5.1)
Sodium: 139 mmol/L (ref 135–145)
Total Bilirubin: 1.1 mg/dL (ref 0.3–1.2)
Total Protein: 6.5 g/dL (ref 6.5–8.1)

## 2020-05-23 LAB — URINALYSIS, ROUTINE W REFLEX MICROSCOPIC
Bilirubin Urine: NEGATIVE
Glucose, UA: NEGATIVE mg/dL
Hgb urine dipstick: NEGATIVE
Ketones, ur: NEGATIVE mg/dL
Leukocytes,Ua: NEGATIVE
Nitrite: NEGATIVE
Protein, ur: NEGATIVE mg/dL
Specific Gravity, Urine: 1.009 (ref 1.005–1.030)
pH: 6 (ref 5.0–8.0)

## 2020-05-23 LAB — BLOOD GAS, ARTERIAL
Acid-Base Excess: 2.1 mmol/L — ABNORMAL HIGH (ref 0.0–2.0)
Bicarbonate: 26 mmol/L (ref 20.0–28.0)
Drawn by: 421801
FIO2: 21
O2 Saturation: 97.6 %
Patient temperature: 37
pCO2 arterial: 40 mmHg (ref 32.0–48.0)
pH, Arterial: 7.429 (ref 7.350–7.450)
pO2, Arterial: 100 mmHg (ref 83.0–108.0)

## 2020-05-23 LAB — HEMOGLOBIN A1C
Hgb A1c MFr Bld: 5.6 % (ref 4.8–5.6)
Mean Plasma Glucose: 114.02 mg/dL

## 2020-05-23 LAB — SURGICAL PCR SCREEN
MRSA, PCR: NEGATIVE
Staphylococcus aureus: POSITIVE — AB

## 2020-05-23 LAB — PROTIME-INR
INR: 1.1 (ref 0.8–1.2)
Prothrombin Time: 13.9 seconds (ref 11.4–15.2)

## 2020-05-23 LAB — SARS CORONAVIRUS 2 (TAT 6-24 HRS): SARS Coronavirus 2: NEGATIVE

## 2020-05-23 LAB — APTT: aPTT: 31 seconds (ref 24–36)

## 2020-05-23 NOTE — Progress Notes (Signed)
Pre-CABG completed. Refer to "CV Proc" under chart review to view preliminary results.  05/23/2020 3:59 PM Kelby Aline., MHA, RVT, RDCS, RDMS

## 2020-05-23 NOTE — Progress Notes (Signed)
  Echocardiogram 2D Echocardiogram has been performed.  Dave Robles 05/23/2020, 2:18 PM

## 2020-05-23 NOTE — Progress Notes (Addendum)
PCP:  Dorothyann Peng, NP Cardiologist:  Denice Bors, MD  EKG:  05/06/20 CXR:  05/23/20 ECHO:  04/07/15 Stress Test:  02/14/18 Cardiac Cath:  05/13/20  Covid test 05/23/20  Anesthesia Review:  Yes, cardiac history.  Last dose Plavix 05/21/20  Patient denies shortness of breath, fever, cough, and chest pain at PAT appointment.  Patient verbalized understanding of instructions provided today at the PAT appointment.  Patient asked to review instructions at home and day of surgery.

## 2020-05-24 LAB — ECHOCARDIOGRAM COMPLETE
Height: 70 in
Weight: 3656 oz

## 2020-05-26 MED ORDER — TRANEXAMIC ACID (OHS) PUMP PRIME SOLUTION
2.0000 mg/kg | INTRAVENOUS | Status: DC
  Filled 2020-05-26: qty 2.07

## 2020-05-26 MED ORDER — PLASMA-LYTE 148 IV SOLN
INTRAVENOUS | Status: DC
  Filled 2020-05-26: qty 2.5

## 2020-05-26 MED ORDER — PHENYLEPHRINE HCL-NACL 20-0.9 MG/250ML-% IV SOLN
30.0000 ug/min | INTRAVENOUS | Status: AC
  Administered 2020-05-27: 25 ug/min via INTRAVENOUS
  Filled 2020-05-26: qty 250

## 2020-05-26 MED ORDER — NITROGLYCERIN IN D5W 200-5 MCG/ML-% IV SOLN
2.0000 ug/min | INTRAVENOUS | Status: AC
  Administered 2020-05-27: 16.6 ug/min via INTRAVENOUS
  Filled 2020-05-26: qty 250

## 2020-05-26 MED ORDER — POTASSIUM CHLORIDE 2 MEQ/ML IV SOLN
80.0000 meq | INTRAVENOUS | Status: DC
  Filled 2020-05-26: qty 40

## 2020-05-26 MED ORDER — EPINEPHRINE HCL 5 MG/250ML IV SOLN IN NS
0.0000 ug/min | INTRAVENOUS | Status: DC
  Filled 2020-05-26: qty 250

## 2020-05-26 MED ORDER — TRANEXAMIC ACID (OHS) BOLUS VIA INFUSION
15.0000 mg/kg | INTRAVENOUS | Status: AC
  Administered 2020-05-27: 1554 mg via INTRAVENOUS
  Filled 2020-05-26: qty 1554

## 2020-05-26 MED ORDER — VANCOMYCIN HCL 1500 MG/300ML IV SOLN
1500.0000 mg | INTRAVENOUS | Status: AC
  Administered 2020-05-27: 1500 mg via INTRAVENOUS
  Filled 2020-05-26: qty 300

## 2020-05-26 MED ORDER — SODIUM CHLORIDE 0.9 % IV SOLN
INTRAVENOUS | Status: DC
  Filled 2020-05-26: qty 30

## 2020-05-26 MED ORDER — DEXMEDETOMIDINE HCL IN NACL 400 MCG/100ML IV SOLN
0.1000 ug/kg/h | INTRAVENOUS | Status: AC
  Administered 2020-05-27: .2 ug/kg/h via INTRAVENOUS
  Filled 2020-05-26: qty 100

## 2020-05-26 MED ORDER — NOREPINEPHRINE 4 MG/250ML-% IV SOLN
0.0000 ug/min | INTRAVENOUS | Status: AC
  Administered 2020-05-27: 2 ug/min via INTRAVENOUS
  Filled 2020-05-26: qty 250

## 2020-05-26 MED ORDER — MILRINONE LACTATE IN DEXTROSE 20-5 MG/100ML-% IV SOLN
0.3000 ug/kg/min | INTRAVENOUS | Status: AC
  Administered 2020-05-27: .25 ug/kg/min via INTRAVENOUS
  Administered 2020-05-27: 5180 ug via INTRAVENOUS
  Filled 2020-05-26: qty 100

## 2020-05-26 MED ORDER — SODIUM CHLORIDE 0.9 % IV SOLN
750.0000 mg | INTRAVENOUS | Status: AC
  Administered 2020-05-27: 750 mg via INTRAVENOUS
  Filled 2020-05-26: qty 750

## 2020-05-26 MED ORDER — MAGNESIUM SULFATE 50 % IJ SOLN
40.0000 meq | INTRAMUSCULAR | Status: DC
  Filled 2020-05-26: qty 9.85

## 2020-05-26 MED ORDER — SODIUM CHLORIDE 0.9 % IV SOLN
1.5000 g | INTRAVENOUS | Status: AC
  Administered 2020-05-27: 1.5 g via INTRAVENOUS
  Filled 2020-05-26: qty 1.5

## 2020-05-26 MED ORDER — TRANEXAMIC ACID 1000 MG/10ML IV SOLN
1.5000 mg/kg/h | INTRAVENOUS | Status: AC
  Administered 2020-05-27: 1.5 mg/kg/h via INTRAVENOUS
  Filled 2020-05-26: qty 25

## 2020-05-26 MED ORDER — INSULIN REGULAR(HUMAN) IN NACL 100-0.9 UT/100ML-% IV SOLN
INTRAVENOUS | Status: AC
  Administered 2020-05-27: 1.3 [IU]/h via INTRAVENOUS
  Filled 2020-05-26: qty 100

## 2020-05-27 ENCOUNTER — Other Ambulatory Visit: Payer: Self-pay

## 2020-05-27 ENCOUNTER — Inpatient Hospital Stay (HOSPITAL_COMMUNITY): Payer: Medicare HMO | Admitting: Vascular Surgery

## 2020-05-27 ENCOUNTER — Inpatient Hospital Stay (HOSPITAL_COMMUNITY): Payer: Medicare HMO | Admitting: Anesthesiology

## 2020-05-27 ENCOUNTER — Inpatient Hospital Stay (HOSPITAL_COMMUNITY): Payer: Medicare HMO

## 2020-05-27 ENCOUNTER — Inpatient Hospital Stay (HOSPITAL_COMMUNITY)
Admission: RE | Admit: 2020-05-27 | Discharge: 2020-06-02 | DRG: 236 | Disposition: A | Discharge status: Home / Self Care | Payer: Medicare HMO | Attending: Cardiothoracic Surgery | Admitting: Cardiothoracic Surgery

## 2020-05-27 ENCOUNTER — Inpatient Hospital Stay (HOSPITAL_COMMUNITY)
Admission: RE | Disposition: A | Discharge status: Home / Self Care | Payer: Self-pay | Source: Home / Self Care | Attending: Cardiothoracic Surgery

## 2020-05-27 DIAGNOSIS — Z9861 Coronary angioplasty status: Secondary | ICD-10-CM | POA: Diagnosis not present

## 2020-05-27 DIAGNOSIS — J9811 Atelectasis: Secondary | ICD-10-CM | POA: Diagnosis not present

## 2020-05-27 DIAGNOSIS — Z87891 Personal history of nicotine dependence: Secondary | ICD-10-CM | POA: Diagnosis not present

## 2020-05-27 DIAGNOSIS — I714 Abdominal aortic aneurysm, without rupture: Secondary | ICD-10-CM | POA: Diagnosis present

## 2020-05-27 DIAGNOSIS — T82855A Stenosis of coronary artery stent, initial encounter: Secondary | ICD-10-CM | POA: Diagnosis not present

## 2020-05-27 DIAGNOSIS — G8918 Other acute postprocedural pain: Secondary | ICD-10-CM

## 2020-05-27 DIAGNOSIS — Y831 Surgical operation with implant of artificial internal device as the cause of abnormal reaction of the patient, or of later complication, without mention of misadventure at the time of the procedure: Secondary | ICD-10-CM | POA: Diagnosis present

## 2020-05-27 DIAGNOSIS — D6959 Other secondary thrombocytopenia: Secondary | ICD-10-CM | POA: Diagnosis not present

## 2020-05-27 DIAGNOSIS — Z96641 Presence of right artificial hip joint: Secondary | ICD-10-CM | POA: Diagnosis present

## 2020-05-27 DIAGNOSIS — D62 Acute posthemorrhagic anemia: Secondary | ICD-10-CM | POA: Diagnosis not present

## 2020-05-27 DIAGNOSIS — E785 Hyperlipidemia, unspecified: Secondary | ICD-10-CM | POA: Diagnosis present

## 2020-05-27 DIAGNOSIS — I2510 Atherosclerotic heart disease of native coronary artery without angina pectoris: Principal | ICD-10-CM

## 2020-05-27 DIAGNOSIS — Z818 Family history of other mental and behavioral disorders: Secondary | ICD-10-CM

## 2020-05-27 DIAGNOSIS — I252 Old myocardial infarction: Secondary | ICD-10-CM

## 2020-05-27 DIAGNOSIS — M1990 Unspecified osteoarthritis, unspecified site: Secondary | ICD-10-CM | POA: Diagnosis not present

## 2020-05-27 DIAGNOSIS — Z951 Presence of aortocoronary bypass graft: Secondary | ICD-10-CM

## 2020-05-27 DIAGNOSIS — Z803 Family history of malignant neoplasm of breast: Secondary | ICD-10-CM | POA: Diagnosis not present

## 2020-05-27 DIAGNOSIS — I10 Essential (primary) hypertension: Secondary | ICD-10-CM | POA: Diagnosis present

## 2020-05-27 DIAGNOSIS — J90 Pleural effusion, not elsewhere classified: Secondary | ICD-10-CM | POA: Diagnosis not present

## 2020-05-27 DIAGNOSIS — Z09 Encounter for follow-up examination after completed treatment for conditions other than malignant neoplasm: Secondary | ICD-10-CM

## 2020-05-27 DIAGNOSIS — Z801 Family history of malignant neoplasm of trachea, bronchus and lung: Secondary | ICD-10-CM | POA: Diagnosis not present

## 2020-05-27 DIAGNOSIS — Z8249 Family history of ischemic heart disease and other diseases of the circulatory system: Secondary | ICD-10-CM

## 2020-05-27 DIAGNOSIS — I517 Cardiomegaly: Secondary | ICD-10-CM | POA: Diagnosis not present

## 2020-05-27 DIAGNOSIS — E876 Hypokalemia: Secondary | ICD-10-CM | POA: Diagnosis not present

## 2020-05-27 DIAGNOSIS — I081 Rheumatic disorders of both mitral and tricuspid valves: Secondary | ICD-10-CM | POA: Diagnosis not present

## 2020-05-27 DIAGNOSIS — J811 Chronic pulmonary edema: Secondary | ICD-10-CM | POA: Diagnosis not present

## 2020-05-27 HISTORY — DX: Presence of aortocoronary bypass graft: Z95.1

## 2020-05-27 HISTORY — PX: RADIAL ARTERY HARVEST: SHX5067

## 2020-05-27 HISTORY — PX: TEE WITHOUT CARDIOVERSION: SHX5443

## 2020-05-27 HISTORY — PX: MEDIASTERNOTOMY: SHX5084

## 2020-05-27 HISTORY — PX: CORONARY ARTERY BYPASS GRAFT: SHX141

## 2020-05-27 LAB — POCT I-STAT, CHEM 8
BUN: 11 mg/dL (ref 8–23)
BUN: 12 mg/dL (ref 8–23)
BUN: 11 mg/dL (ref 8–23)
BUN: 11 mg/dL (ref 8–23)
BUN: 13 mg/dL (ref 8–23)
BUN: 13 mg/dL (ref 8–23)
BUN: 12 mg/dL (ref 8–23)
Calcium, Ion: 1.14 mmol/L — ABNORMAL LOW (ref 1.15–1.40)
Calcium, Ion: 1.21 mmol/L (ref 1.15–1.40)
Calcium, Ion: 0.94 mmol/L — ABNORMAL LOW (ref 1.15–1.40)
Calcium, Ion: 1.02 mmol/L — ABNORMAL LOW (ref 1.15–1.40)
Calcium, Ion: 0.96 mmol/L — ABNORMAL LOW (ref 1.15–1.40)
Calcium, Ion: 0.97 mmol/L — ABNORMAL LOW (ref 1.15–1.40)
Calcium, Ion: 0.95 mmol/L — ABNORMAL LOW (ref 1.15–1.40)
Chloride: 103 mmol/L (ref 98–111)
Chloride: 105 mmol/L (ref 98–111)
Chloride: 100 mmol/L (ref 98–111)
Chloride: 102 mmol/L (ref 98–111)
Chloride: 102 mmol/L (ref 98–111)
Chloride: 102 mmol/L (ref 98–111)
Chloride: 103 mmol/L (ref 98–111)
Creatinine, Ser: 0.8 mg/dL (ref 0.61–1.24)
Creatinine, Ser: 0.8 mg/dL (ref 0.61–1.24)
Creatinine, Ser: 0.7 mg/dL (ref 0.61–1.24)
Creatinine, Ser: 0.8 mg/dL (ref 0.61–1.24)
Creatinine, Ser: 0.7 mg/dL (ref 0.61–1.24)
Creatinine, Ser: 0.8 mg/dL (ref 0.61–1.24)
Creatinine, Ser: 0.7 mg/dL (ref 0.61–1.24)
Glucose, Bld: 93 mg/dL (ref 70–99)
Glucose, Bld: 134 mg/dL — ABNORMAL HIGH (ref 70–99)
Glucose, Bld: 118 mg/dL — ABNORMAL HIGH (ref 70–99)
Glucose, Bld: 125 mg/dL — ABNORMAL HIGH (ref 70–99)
Glucose, Bld: 123 mg/dL — ABNORMAL HIGH (ref 70–99)
Glucose, Bld: 144 mg/dL — ABNORMAL HIGH (ref 70–99)
Glucose, Bld: 126 mg/dL — ABNORMAL HIGH (ref 70–99)
HCT: 40 % (ref 39.0–52.0)
HCT: 35 % — ABNORMAL LOW (ref 39.0–52.0)
HCT: 26 % — ABNORMAL LOW (ref 39.0–52.0)
HCT: 33 % — ABNORMAL LOW (ref 39.0–52.0)
HCT: 29 % — ABNORMAL LOW (ref 39.0–52.0)
HCT: 31 % — ABNORMAL LOW (ref 39.0–52.0)
HCT: 33 % — ABNORMAL LOW (ref 39.0–52.0)
Hemoglobin: 13.6 g/dL (ref 13.0–17.0)
Hemoglobin: 11.9 g/dL — ABNORMAL LOW (ref 13.0–17.0)
Hemoglobin: 11.2 g/dL — ABNORMAL LOW (ref 13.0–17.0)
Hemoglobin: 8.8 g/dL — ABNORMAL LOW (ref 13.0–17.0)
Hemoglobin: 10.5 g/dL — ABNORMAL LOW (ref 13.0–17.0)
Hemoglobin: 9.9 g/dL — ABNORMAL LOW (ref 13.0–17.0)
Hemoglobin: 11.2 g/dL — ABNORMAL LOW (ref 13.0–17.0)
Potassium: 4 mmol/L (ref 3.5–5.1)
Potassium: 4.2 mmol/L (ref 3.5–5.1)
Potassium: 4.3 mmol/L (ref 3.5–5.1)
Potassium: 4.5 mmol/L (ref 3.5–5.1)
Potassium: 4.7 mmol/L (ref 3.5–5.1)
Potassium: 4.8 mmol/L (ref 3.5–5.1)
Potassium: 4.1 mmol/L (ref 3.5–5.1)
Sodium: 143 mmol/L (ref 135–145)
Sodium: 141 mmol/L (ref 135–145)
Sodium: 140 mmol/L (ref 135–145)
Sodium: 143 mmol/L (ref 135–145)
Sodium: 139 mmol/L (ref 135–145)
Sodium: 140 mmol/L (ref 135–145)
Sodium: 141 mmol/L (ref 135–145)
TCO2: 25 mmol/L (ref 22–32)
TCO2: 27 mmol/L (ref 22–32)
TCO2: 26 mmol/L (ref 22–32)
TCO2: 27 mmol/L (ref 22–32)
TCO2: 25 mmol/L (ref 22–32)
TCO2: 26 mmol/L (ref 22–32)
TCO2: 24 mmol/L (ref 22–32)

## 2020-05-27 LAB — POCT I-STAT 7, (LYTES, BLD GAS, ICA,H+H)
Acid-Base Excess: 2 mmol/L (ref 0.0–2.0)
Acid-Base Excess: 3 mmol/L — ABNORMAL HIGH (ref 0.0–2.0)
Acid-Base Excess: 1 mmol/L (ref 0.0–2.0)
Acid-Base Excess: 2 mmol/L (ref 0.0–2.0)
Acid-Base Excess: 0 mmol/L (ref 0.0–2.0)
Bicarbonate: 28.2 mmol/L — ABNORMAL HIGH (ref 20.0–28.0)
Bicarbonate: 30 mmol/L — ABNORMAL HIGH (ref 20.0–28.0)
Bicarbonate: 25.8 mmol/L (ref 20.0–28.0)
Bicarbonate: 27.3 mmol/L (ref 20.0–28.0)
Bicarbonate: 26.6 mmol/L (ref 20.0–28.0)
Calcium, Ion: 1.22 mmol/L (ref 1.15–1.40)
Calcium, Ion: 0.95 mmol/L — ABNORMAL LOW (ref 1.15–1.40)
Calcium, Ion: 0.99 mmol/L — ABNORMAL LOW (ref 1.15–1.40)
Calcium, Ion: 1.02 mmol/L — ABNORMAL LOW (ref 1.15–1.40)
Calcium, Ion: 1 mmol/L — ABNORMAL LOW (ref 1.15–1.40)
HCT: 39 % (ref 39.0–52.0)
HCT: 30 % — ABNORMAL LOW (ref 39.0–52.0)
HCT: 26 % — ABNORMAL LOW (ref 39.0–52.0)
HCT: 31 % — ABNORMAL LOW (ref 39.0–52.0)
HCT: 33 % — ABNORMAL LOW (ref 39.0–52.0)
Hemoglobin: 13.3 g/dL (ref 13.0–17.0)
Hemoglobin: 10.2 g/dL — ABNORMAL LOW (ref 13.0–17.0)
Hemoglobin: 8.8 g/dL — ABNORMAL LOW (ref 13.0–17.0)
Hemoglobin: 10.5 g/dL — ABNORMAL LOW (ref 13.0–17.0)
Hemoglobin: 11.2 g/dL — ABNORMAL LOW (ref 13.0–17.0)
O2 Saturation: 100 %
O2 Saturation: 100 %
O2 Saturation: 100 %
O2 Saturation: 100 %
O2 Saturation: 100 %
Potassium: 3.9 mmol/L (ref 3.5–5.1)
Potassium: 4.5 mmol/L (ref 3.5–5.1)
Potassium: 4.5 mmol/L (ref 3.5–5.1)
Potassium: 4.9 mmol/L (ref 3.5–5.1)
Potassium: 4.2 mmol/L (ref 3.5–5.1)
Sodium: 142 mmol/L (ref 135–145)
Sodium: 143 mmol/L (ref 135–145)
Sodium: 140 mmol/L (ref 135–145)
Sodium: 139 mmol/L (ref 135–145)
Sodium: 142 mmol/L (ref 135–145)
TCO2: 30 mmol/L (ref 22–32)
TCO2: 32 mmol/L (ref 22–32)
TCO2: 27 mmol/L (ref 22–32)
TCO2: 29 mmol/L (ref 22–32)
TCO2: 28 mmol/L (ref 22–32)
pCO2 arterial: 50 mmHg — ABNORMAL HIGH (ref 32.0–48.0)
pCO2 arterial: 55 mmHg — ABNORMAL HIGH (ref 32.0–48.0)
pCO2 arterial: 39.2 mmHg (ref 32.0–48.0)
pCO2 arterial: 43.6 mmHg (ref 32.0–48.0)
pCO2 arterial: 48.7 mmHg — ABNORMAL HIGH (ref 32.0–48.0)
pH, Arterial: 7.359 (ref 7.350–7.450)
pH, Arterial: 7.344 — ABNORMAL LOW (ref 7.350–7.450)
pH, Arterial: 7.426 (ref 7.350–7.450)
pH, Arterial: 7.404 (ref 7.350–7.450)
pH, Arterial: 7.346 — ABNORMAL LOW (ref 7.350–7.450)
pO2, Arterial: 523 mmHg — ABNORMAL HIGH (ref 83.0–108.0)
pO2, Arterial: 453 mmHg — ABNORMAL HIGH (ref 83.0–108.0)
pO2, Arterial: 311 mmHg — ABNORMAL HIGH (ref 83.0–108.0)
pO2, Arterial: 377 mmHg — ABNORMAL HIGH (ref 83.0–108.0)
pO2, Arterial: 286 mmHg — ABNORMAL HIGH (ref 83.0–108.0)

## 2020-05-27 LAB — CBC
HCT: 23 % — ABNORMAL LOW (ref 39.0–52.0)
HCT: 19 % — ABNORMAL LOW (ref 39.0–52.0)
Hemoglobin: 7.7 g/dL — ABNORMAL LOW (ref 13.0–17.0)
Hemoglobin: 6.5 g/dL — CL (ref 13.0–17.0)
MCH: 32 pg (ref 26.0–34.0)
MCH: 32.5 pg (ref 26.0–34.0)
MCHC: 33.5 g/dL (ref 30.0–36.0)
MCHC: 34.2 g/dL (ref 30.0–36.0)
MCV: 95.4 fL (ref 80.0–100.0)
MCV: 95 fL (ref 80.0–100.0)
Platelets: 103 10*3/uL — ABNORMAL LOW (ref 150–400)
Platelets: 84 10*3/uL — ABNORMAL LOW (ref 150–400)
RBC: 2.41 MIL/uL — ABNORMAL LOW (ref 4.22–5.81)
RBC: 2 MIL/uL — ABNORMAL LOW (ref 4.22–5.81)
RDW: 13 % (ref 11.5–15.5)
RDW: 13.1 % (ref 11.5–15.5)
WBC: 15.5 10*3/uL — ABNORMAL HIGH (ref 4.0–10.5)
WBC: 12.3 10*3/uL — ABNORMAL HIGH (ref 4.0–10.5)
nRBC: 0 % (ref 0.0–0.2)
nRBC: 0 % (ref 0.0–0.2)

## 2020-05-27 LAB — GLUCOSE, CAPILLARY
Glucose-Capillary: 119 mg/dL — ABNORMAL HIGH (ref 70–99)
Glucose-Capillary: 118 mg/dL — ABNORMAL HIGH (ref 70–99)
Glucose-Capillary: 112 mg/dL — ABNORMAL HIGH (ref 70–99)
Glucose-Capillary: 98 mg/dL (ref 70–99)
Glucose-Capillary: 132 mg/dL — ABNORMAL HIGH (ref 70–99)

## 2020-05-27 LAB — HEMOGLOBIN AND HEMATOCRIT, BLOOD
HCT: 30.2 % — ABNORMAL LOW (ref 39.0–52.0)
Hemoglobin: 10.1 g/dL — ABNORMAL LOW (ref 13.0–17.0)

## 2020-05-27 LAB — PREPARE RBC (CROSSMATCH)

## 2020-05-27 LAB — PROTIME-INR
INR: 2.2 — ABNORMAL HIGH (ref 0.8–1.2)
Prothrombin Time: 24 seconds — ABNORMAL HIGH (ref 11.4–15.2)

## 2020-05-27 LAB — APTT: aPTT: 39 seconds — ABNORMAL HIGH (ref 24–36)

## 2020-05-27 LAB — PLATELET COUNT: Platelets: 80 10*3/uL — ABNORMAL LOW (ref 150–400)

## 2020-05-27 SURGERY — REDO CORONARY ARTERY BYPASS GRAFTING (CABG)
Anesthesia: General | Site: Chest

## 2020-05-27 MED ORDER — MIDAZOLAM HCL 2 MG/2ML IJ SOLN
2.0000 mg | INTRAMUSCULAR | Status: DC | PRN
  Administered 2020-05-27: 2 mg via INTRAVENOUS
  Filled 2020-05-27: qty 2

## 2020-05-27 MED ORDER — HEMOSTATIC AGENTS (NO CHARGE) OPTIME
TOPICAL | Status: DC | PRN
  Administered 2020-05-27: 1 via TOPICAL

## 2020-05-27 MED ORDER — LACTATED RINGERS IV SOLN: INTRAVENOUS | Status: DC

## 2020-05-27 MED ORDER — POTASSIUM CHLORIDE 10 MEQ/50ML IV SOLN: 10.0000 meq | INTRAVENOUS | Status: AC

## 2020-05-27 MED ORDER — CHLORHEXIDINE GLUCONATE 0.12% ORAL RINSE (MEDLINE KIT): 15.0000 mL | Freq: Two times a day (BID) | OROMUCOSAL | Status: DC

## 2020-05-27 MED ORDER — HEPARIN SODIUM (PORCINE) 1000 UNIT/ML IJ SOLN
INTRAMUSCULAR | Status: AC
  Filled 2020-05-27: qty 1

## 2020-05-27 MED ORDER — VANCOMYCIN HCL IN DEXTROSE 1-5 GM/200ML-% IV SOLN
1000.0000 mg | Freq: Once | INTRAVENOUS | Status: AC
  Administered 2020-05-28: 1000 mg via INTRAVENOUS
  Filled 2020-05-27: qty 200

## 2020-05-27 MED ORDER — NOREPINEPHRINE 4 MG/250ML-% IV SOLN
0.0000 ug/min | INTRAVENOUS | Status: DC
  Filled 2020-05-27 (×2): qty 250

## 2020-05-27 MED ORDER — ACETAMINOPHEN 650 MG RE SUPP
650.0000 mg | Freq: Once | RECTAL | Status: AC
  Administered 2020-05-27: 650 mg via RECTAL

## 2020-05-27 MED ORDER — ACETAMINOPHEN 160 MG/5ML PO SOLN: 650.0000 mg | Freq: Once | ORAL | Status: AC

## 2020-05-27 MED ORDER — SODIUM CHLORIDE 0.9 % IR SOLN
Status: DC | PRN
  Administered 2020-05-27: 6000 mL

## 2020-05-27 MED ORDER — DEXMEDETOMIDINE HCL IN NACL 200 MCG/50ML IV SOLN
INTRAVENOUS | Status: AC
  Filled 2020-05-27: qty 50

## 2020-05-27 MED ORDER — METOPROLOL TARTRATE 12.5 MG HALF TABLET
12.5000 mg | ORAL_TABLET | Freq: Two times a day (BID) | ORAL | Status: DC
  Filled 2020-05-27: qty 1

## 2020-05-27 MED ORDER — FENTANYL CITRATE (PF) 250 MCG/5ML IJ SOLN
INTRAMUSCULAR | Status: AC
  Filled 2020-05-27: qty 25

## 2020-05-27 MED ORDER — METOPROLOL TARTRATE 25 MG/10 ML ORAL SUSPENSION: 12.5000 mg | Freq: Two times a day (BID) | ORAL | Status: DC

## 2020-05-27 MED ORDER — BISACODYL 10 MG RE SUPP: 10.0000 mg | Freq: Every day | RECTAL | Status: DC

## 2020-05-27 MED ORDER — ORAL CARE MOUTH RINSE: 15.0000 mL | OROMUCOSAL | Status: DC

## 2020-05-27 MED ORDER — HEMOSTATIC AGENTS (NO CHARGE) OPTIME
TOPICAL | Status: DC | PRN
  Administered 2020-05-27 (×2): 1 via TOPICAL

## 2020-05-27 MED ORDER — LACTATED RINGERS IV SOLN: INTRAVENOUS | Status: DC | PRN

## 2020-05-27 MED ORDER — METOPROLOL TARTRATE 5 MG/5ML IV SOLN: 2.5000 mg | INTRAVENOUS | Status: DC | PRN

## 2020-05-27 MED ORDER — ACETAMINOPHEN 160 MG/5ML PO SOLN
1000.0000 mg | Freq: Four times a day (QID) | ORAL | Status: DC
  Administered 2020-05-28: 1000 mg
  Filled 2020-05-27: qty 40.6

## 2020-05-27 MED ORDER — DOPAMINE-DEXTROSE 3.2-5 MG/ML-% IV SOLN
0.0000 ug/kg/min | INTRAVENOUS | Status: DC
  Administered 2020-05-29: 3 ug/kg/min via INTRAVENOUS
  Filled 2020-05-27 (×2): qty 250

## 2020-05-27 MED ORDER — FENTANYL CITRATE (PF) 250 MCG/5ML IJ SOLN
INTRAMUSCULAR | Status: AC
  Filled 2020-05-27: qty 5

## 2020-05-27 MED ORDER — PHENYLEPHRINE HCL-NACL 20-0.9 MG/250ML-% IV SOLN
0.0000 ug/min | INTRAVENOUS | Status: DC
  Filled 2020-05-27: qty 250

## 2020-05-27 MED ORDER — SODIUM CHLORIDE 0.9% FLUSH: 10.0000 mL | INTRAVENOUS | Status: DC | PRN

## 2020-05-27 MED ORDER — MAGNESIUM SULFATE 4 GM/100ML IV SOLN: 4.0000 g | Freq: Once | INTRAVENOUS | Status: AC

## 2020-05-27 MED ORDER — SODIUM CHLORIDE 0.9 % IV SOLN
1.5000 g | Freq: Two times a day (BID) | INTRAVENOUS | Status: AC
  Administered 2020-05-27 – 2020-05-29 (×4): 1.5 g via INTRAVENOUS
  Filled 2020-05-27 (×5): qty 1.5

## 2020-05-27 MED ORDER — CHLORHEXIDINE GLUCONATE 0.12 % MT SOLN: 15.0000 mL | OROMUCOSAL | Status: AC

## 2020-05-27 MED ORDER — ISOSORBIDE MONONITRATE ER 30 MG PO TB24
15.0000 mg | ORAL_TABLET | Freq: Every day | ORAL | Status: DC
  Filled 2020-05-27: qty 1

## 2020-05-27 MED ORDER — SODIUM CHLORIDE 0.9 % IV SOLN: 250.0000 mL | INTRAVENOUS | Status: DC

## 2020-05-27 MED ORDER — DOPAMINE-DEXTROSE 1.6-5 MG/ML-% IV SOLN
INTRAVENOUS | Status: DC | PRN
  Administered 2020-05-27: 3 ug/kg/min via INTRAVENOUS

## 2020-05-27 MED ORDER — DOCUSATE SODIUM 100 MG PO CAPS
200.0000 mg | ORAL_CAPSULE | Freq: Every day | ORAL | Status: DC
  Administered 2020-05-28 – 2020-06-02 (×5): 200 mg via ORAL
  Filled 2020-05-27 (×5): qty 2

## 2020-05-27 MED ORDER — ALBUMIN HUMAN 5 % IV SOLN
INTRAVENOUS | Status: DC | PRN
Start: 2020-05-27 — End: 2020-05-27

## 2020-05-27 MED ORDER — SODIUM CHLORIDE (PF) 0.9 % IJ SOLN: OROMUCOSAL | Status: DC | PRN

## 2020-05-27 MED ORDER — FAMOTIDINE IN NACL 20-0.9 MG/50ML-% IV SOLN
20.0000 mg | Freq: Two times a day (BID) | INTRAVENOUS | Status: AC
  Administered 2020-05-27: 20 mg via INTRAVENOUS
  Filled 2020-05-27: qty 50

## 2020-05-27 MED ORDER — ROCURONIUM BROMIDE 10 MG/ML (PF) SYRINGE
PREFILLED_SYRINGE | INTRAVENOUS | Status: AC
  Filled 2020-05-27: qty 10

## 2020-05-27 MED ORDER — SODIUM CHLORIDE 0.9% IV SOLUTION: Freq: Once | INTRAVENOUS | Status: AC

## 2020-05-27 MED ORDER — NITROGLYCERIN IN D5W 200-5 MCG/ML-% IV SOLN: 7.0000 ug/min | INTRAVENOUS | Status: DC

## 2020-05-27 MED ORDER — CHLORHEXIDINE GLUCONATE 0.12% ORAL RINSE (MEDLINE KIT)
15.0000 mL | Freq: Two times a day (BID) | OROMUCOSAL | Status: DC
  Administered 2020-05-27 – 2020-05-28 (×3): 15 mL via OROMUCOSAL

## 2020-05-27 MED ORDER — CLOBETASOL PROPIONATE 0.05 % EX CREA
1.0000 "application " | TOPICAL_CREAM | Freq: Every day | CUTANEOUS | Status: DC
  Administered 2020-05-27 – 2020-05-29 (×3): 1 via TOPICAL
  Filled 2020-05-27: qty 15

## 2020-05-27 MED ORDER — SODIUM CHLORIDE 0.9 % IV SOLN: 10.0000 mL/h | Freq: Once | INTRAVENOUS | Status: DC

## 2020-05-27 MED ORDER — ROCURONIUM BROMIDE 10 MG/ML (PF) SYRINGE
PREFILLED_SYRINGE | INTRAVENOUS | Status: AC
  Filled 2020-05-27: qty 20

## 2020-05-27 MED ORDER — SODIUM CHLORIDE 0.9 % IV SOLN: INTRAVENOUS | Status: DC | PRN

## 2020-05-27 MED ORDER — PLASMA-LYTE 148 IV SOLN
INTRAVENOUS | Status: DC | PRN
  Administered 2020-05-27: 500 mL

## 2020-05-27 MED ORDER — MIDAZOLAM HCL 2 MG/2ML IJ SOLN
INTRAMUSCULAR | Status: AC
  Filled 2020-05-27: qty 2

## 2020-05-27 MED ORDER — CHLORHEXIDINE GLUCONATE 4 % EX LIQD: 30.0000 mL | CUTANEOUS | Status: DC

## 2020-05-27 MED ORDER — MIDAZOLAM HCL (PF) 5 MG/ML IJ SOLN
INTRAMUSCULAR | Status: DC | PRN
  Administered 2020-05-27 (×6): 2 mg via INTRAVENOUS

## 2020-05-27 MED ORDER — LACTATED RINGERS IV SOLN: 500.0000 mL | Freq: Once | INTRAVENOUS | Status: DC | PRN

## 2020-05-27 MED ORDER — CHLORHEXIDINE GLUCONATE CLOTH 2 % EX PADS
6.0000 | MEDICATED_PAD | Freq: Every day | CUTANEOUS | Status: DC
  Administered 2020-05-28 – 2020-06-01 (×5): 6 via TOPICAL

## 2020-05-27 MED ORDER — PHENYLEPHRINE 40 MCG/ML (10ML) SYRINGE FOR IV PUSH (FOR BLOOD PRESSURE SUPPORT)
PREFILLED_SYRINGE | INTRAVENOUS | Status: AC
  Filled 2020-05-27: qty 10

## 2020-05-27 MED ORDER — PROPOFOL 10 MG/ML IV BOLUS
INTRAVENOUS | Status: AC
  Filled 2020-05-27: qty 20

## 2020-05-27 MED ORDER — ROCURONIUM BROMIDE 10 MG/ML (PF) SYRINGE
PREFILLED_SYRINGE | INTRAVENOUS | Status: DC | PRN
  Administered 2020-05-27: 50 mg via INTRAVENOUS
  Administered 2020-05-27: 100 mg via INTRAVENOUS
  Administered 2020-05-27 (×5): 50 mg via INTRAVENOUS

## 2020-05-27 MED ORDER — ALBUMIN HUMAN 5 % IV SOLN
250.0000 mL | INTRAVENOUS | Status: AC | PRN
  Administered 2020-05-27 (×2): 12.5 g via INTRAVENOUS
  Filled 2020-05-27: qty 250

## 2020-05-27 MED ORDER — OXYCODONE HCL 5 MG PO TABS
5.0000 mg | ORAL_TABLET | ORAL | Status: DC | PRN
  Administered 2020-05-29: 10 mg via ORAL
  Filled 2020-05-27: qty 2

## 2020-05-27 MED ORDER — TRAMADOL HCL 50 MG PO TABS
50.0000 mg | ORAL_TABLET | ORAL | Status: DC | PRN
  Administered 2020-05-28: 50 mg via ORAL
  Administered 2020-05-28: 100 mg via ORAL
  Administered 2020-05-28 – 2020-05-30 (×4): 50 mg via ORAL
  Filled 2020-05-27 (×2): qty 1
  Filled 2020-05-27 (×2): qty 2
  Filled 2020-05-27 (×3): qty 1

## 2020-05-27 MED ORDER — SODIUM CHLORIDE 0.9% FLUSH
10.0000 mL | Freq: Two times a day (BID) | INTRAVENOUS | Status: DC
  Administered 2020-05-27 – 2020-05-29 (×4): 10 mL

## 2020-05-27 MED ORDER — MILRINONE LACTATE IN DEXTROSE 20-5 MG/100ML-% IV SOLN
0.2500 ug/kg/min | INTRAVENOUS | Status: DC
  Administered 2020-05-28 – 2020-05-29 (×2): 0.25 ug/kg/min via INTRAVENOUS
  Filled 2020-05-27 (×2): qty 100

## 2020-05-27 MED ORDER — PANTOPRAZOLE SODIUM 40 MG PO TBEC
40.0000 mg | DELAYED_RELEASE_TABLET | Freq: Every day | ORAL | Status: DC
  Administered 2020-05-29 – 2020-06-02 (×5): 40 mg via ORAL
  Filled 2020-05-27 (×5): qty 1

## 2020-05-27 MED ORDER — SODIUM CHLORIDE 0.9% FLUSH: 3.0000 mL | INTRAVENOUS | Status: DC | PRN

## 2020-05-27 MED ORDER — ORAL CARE MOUTH RINSE
15.0000 mL | OROMUCOSAL | Status: DC
  Administered 2020-05-27 – 2020-05-28 (×3): 15 mL via OROMUCOSAL

## 2020-05-27 MED ORDER — ASPIRIN EC 325 MG PO TBEC
325.0000 mg | DELAYED_RELEASE_TABLET | Freq: Every day | ORAL | Status: DC
  Administered 2020-05-28 – 2020-05-29 (×2): 325 mg via ORAL
  Filled 2020-05-27 (×2): qty 1

## 2020-05-27 MED ORDER — CALCIUM CHLORIDE 10 % IV SOLN
INTRAVENOUS | Status: AC
  Filled 2020-05-27: qty 10

## 2020-05-27 MED ORDER — SODIUM CHLORIDE 0.9 % IV SOLN: INTRAVENOUS | Status: DC

## 2020-05-27 MED ORDER — INSULIN REGULAR(HUMAN) IN NACL 100-0.9 UT/100ML-% IV SOLN: INTRAVENOUS | Status: DC

## 2020-05-27 MED ORDER — ONDANSETRON HCL 4 MG/2ML IJ SOLN: 4.0000 mg | Freq: Four times a day (QID) | INTRAMUSCULAR | Status: DC | PRN

## 2020-05-27 MED ORDER — PLASMA-LYTE 148 IV SOLN: INTRAVENOUS | Status: DC | PRN

## 2020-05-27 MED ORDER — FAMOTIDINE IN NACL 20-0.9 MG/50ML-% IV SOLN
INTRAVENOUS | Status: AC
  Administered 2020-05-27: 20 mg via INTRAVENOUS
  Filled 2020-05-27: qty 50

## 2020-05-27 MED ORDER — METOPROLOL TARTRATE 12.5 MG HALF TABLET
12.5000 mg | ORAL_TABLET | Freq: Once | ORAL | Status: DC
  Filled 2020-05-27: qty 1

## 2020-05-27 MED ORDER — CALCIUM CHLORIDE 10 % IV SOLN
INTRAVENOUS | Status: DC | PRN
  Administered 2020-05-27 (×3): 200 mg via INTRAVENOUS

## 2020-05-27 MED ORDER — MIDAZOLAM HCL (PF) 10 MG/2ML IJ SOLN
INTRAMUSCULAR | Status: AC
  Filled 2020-05-27: qty 2

## 2020-05-27 MED ORDER — DEXMEDETOMIDINE HCL IN NACL 400 MCG/100ML IV SOLN
0.0000 ug/kg/h | INTRAVENOUS | Status: DC
  Filled 2020-05-27: qty 200

## 2020-05-27 MED ORDER — MORPHINE SULFATE (PF) 2 MG/ML IV SOLN
1.0000 mg | INTRAVENOUS | Status: DC | PRN
  Administered 2020-05-27 – 2020-05-28 (×2): 2 mg via INTRAVENOUS
  Administered 2020-05-28: 1 mg via INTRAVENOUS
  Administered 2020-05-29: 4 mg via INTRAVENOUS
  Filled 2020-05-27 (×3): qty 2

## 2020-05-27 MED ORDER — HEPARIN SODIUM (PORCINE) 1000 UNIT/ML IJ SOLN
INTRAMUSCULAR | Status: DC | PRN
  Administered 2020-05-27: 37000 [IU] via INTRAVENOUS

## 2020-05-27 MED ORDER — PROTAMINE SULFATE 10 MG/ML IV SOLN
INTRAVENOUS | Status: DC | PRN
  Administered 2020-05-27: 370 mg via INTRAVENOUS
  Administered 2020-05-27: 30 mg via INTRAVENOUS

## 2020-05-27 MED ORDER — CHLORHEXIDINE GLUCONATE 0.12 % MT SOLN
15.0000 mL | Freq: Once | OROMUCOSAL | Status: DC
  Filled 2020-05-27: qty 15

## 2020-05-27 MED ORDER — ACETAMINOPHEN 500 MG PO TABS
1000.0000 mg | ORAL_TABLET | Freq: Four times a day (QID) | ORAL | Status: AC
  Administered 2020-05-28 – 2020-06-01 (×19): 1000 mg via ORAL
  Filled 2020-05-27 (×19): qty 2

## 2020-05-27 MED ORDER — FENTANYL CITRATE (PF) 250 MCG/5ML IJ SOLN
INTRAMUSCULAR | Status: DC | PRN
  Administered 2020-05-27: 250 ug via INTRAVENOUS
  Administered 2020-05-27: 50 ug via INTRAVENOUS
  Administered 2020-05-27: 100 ug via INTRAVENOUS
  Administered 2020-05-27: 900 ug via INTRAVENOUS

## 2020-05-27 MED ORDER — SODIUM CHLORIDE 0.45 % IV SOLN: INTRAVENOUS | Status: DC | PRN

## 2020-05-27 MED ORDER — CALCIPOTRIENE 0.005 % EX CREA: 1.0000 "application " | TOPICAL_CREAM | Freq: Two times a day (BID) | CUTANEOUS | Status: DC

## 2020-05-27 MED ORDER — ATORVASTATIN CALCIUM 80 MG PO TABS
80.0000 mg | ORAL_TABLET | Freq: Every day | ORAL | Status: DC
  Administered 2020-05-28 – 2020-06-02 (×6): 80 mg via ORAL
  Filled 2020-05-27 (×6): qty 1

## 2020-05-27 MED ORDER — ASPIRIN 81 MG PO CHEW: 324.0000 mg | CHEWABLE_TABLET | Freq: Every day | ORAL | Status: DC

## 2020-05-27 MED ORDER — DEXTROSE 50 % IV SOLN: 0.0000 mL | INTRAVENOUS | Status: DC | PRN

## 2020-05-27 MED ORDER — SODIUM CHLORIDE 0.9% FLUSH
3.0000 mL | Freq: Two times a day (BID) | INTRAVENOUS | Status: DC
  Administered 2020-05-28 – 2020-05-31 (×4): 3 mL via INTRAVENOUS

## 2020-05-27 MED ORDER — MAGNESIUM SULFATE 4 GM/100ML IV SOLN
INTRAVENOUS | Status: AC
  Administered 2020-05-27: 4 g via INTRAVENOUS
  Filled 2020-05-27: qty 100

## 2020-05-27 MED ORDER — PROPOFOL 10 MG/ML IV BOLUS
INTRAVENOUS | Status: DC | PRN
  Administered 2020-05-27: 50 mg via INTRAVENOUS

## 2020-05-27 MED ORDER — BISACODYL 5 MG PO TBEC
10.0000 mg | DELAYED_RELEASE_TABLET | Freq: Every day | ORAL | Status: DC
  Administered 2020-05-28 – 2020-05-30 (×3): 10 mg via ORAL
  Filled 2020-05-27 (×4): qty 2

## 2020-05-27 MED ORDER — BUPROPION HCL ER (XL) 300 MG PO TB24
300.0000 mg | ORAL_TABLET | Freq: Every day | ORAL | Status: DC
  Administered 2020-05-28 – 2020-06-02 (×6): 300 mg via ORAL
  Filled 2020-05-27 (×7): qty 1

## 2020-05-27 MED ORDER — SODIUM CHLORIDE (PF) 0.9 % IJ SOLN
INTRAMUSCULAR | Status: AC
  Filled 2020-05-27: qty 10

## 2020-05-27 MED ORDER — PROTAMINE SULFATE 10 MG/ML IV SOLN
INTRAVENOUS | Status: AC
  Filled 2020-05-27: qty 50

## 2020-05-27 MED ORDER — TRANEXAMIC ACID 1000 MG/10ML IV SOLN
1.5000 mg/kg/h | INTRAVENOUS | Status: DC
  Filled 2020-05-27: qty 25

## 2020-05-27 SURGICAL SUPPLY — 124 items
ADAPTER CARDIO PERF ANTE/RETRO (ADAPTER) ×1 IMPLANT
ADPR PRFSN 84XANTGRD RTRGD (ADAPTER) ×3
AGENT HMST KT MTR STRL THRMB (HEMOSTASIS) ×3
BAG DECANTER FOR FLEXI CONT (MISCELLANEOUS) ×4 IMPLANT
BLADE CLIPPER SURG (BLADE) ×4 IMPLANT
BLADE CORE FAN STRYKER (BLADE) ×7 IMPLANT
BLADE OSCILLATING /SAGITTAL (BLADE) ×4 IMPLANT
BLADE STERNUM SYSTEM 6 (BLADE) ×4 IMPLANT
BLADE SURG 15 STRL LF DISP TIS (BLADE) IMPLANT
BLADE SURG 15 STRL SS (BLADE)
BNDG ELASTIC 4X5.8 VLCR STR LF (GAUZE/BANDAGES/DRESSINGS) ×4 IMPLANT
BNDG ELASTIC 6X5.8 VLCR STR LF (GAUZE/BANDAGES/DRESSINGS) ×3 IMPLANT
BNDG GAUZE ELAST 4 BULKY (GAUZE/BANDAGES/DRESSINGS) ×4 IMPLANT
CANISTER SUCT 3000ML PPV (MISCELLANEOUS) ×4 IMPLANT
CANN PRFSN .5XCNCT 15X34-48 (MISCELLANEOUS) ×3
CANNULA GUNDRY RCSP 15FR (MISCELLANEOUS) ×1 IMPLANT
CANNULA PRFSN .5XCNCT 15X34-48 (MISCELLANEOUS) ×3 IMPLANT
CANNULA VEN 2 STAGE (MISCELLANEOUS) ×5 IMPLANT
CATH ACUITYPRO IC 90 7F (CATHETERS)
CATH CPB KIT GERHARDT (MISCELLANEOUS) ×4 IMPLANT
CATH GD CS-IC 90 CVD 60X7FR (CATHETERS) IMPLANT
CATH THORACIC 28FR (CATHETERS) ×5 IMPLANT
CLIP RETRACTION 3.0MM CORONARY (MISCELLANEOUS) ×1 IMPLANT
CLIP VESOCCLUDE MED 24/CT (CLIP) ×1 IMPLANT
CLIP VESOCCLUDE SM WIDE 24/CT (CLIP) ×1 IMPLANT
CONN ST 1/4X3/8  BEN (MISCELLANEOUS) ×4
CONN ST 1/4X3/8 BEN (MISCELLANEOUS) IMPLANT
COVER MAYO STAND STRL (DRAPES) ×1 IMPLANT
COVER SURGICAL LIGHT HANDLE (MISCELLANEOUS) ×2 IMPLANT
CUFF TOURN SGL QUICK 18X4 (TOURNIQUET CUFF) IMPLANT
CUFF TOURN SGL QUICK 24 (TOURNIQUET CUFF)
CUFF TRNQT CYL 24X4X16.5-23 (TOURNIQUET CUFF) IMPLANT
DRAIN CHANNEL 28F RND 3/8 FF (WOUND CARE) ×5 IMPLANT
DRAPE CARDIOVASCULAR INCISE (DRAPES) ×4
DRAPE EXTREMITY T 121X128X90 (DISPOSABLE) ×4 IMPLANT
DRAPE HALF SHEET 40X57 (DRAPES) IMPLANT
DRAPE INCISE IOBAN 66X45 STRL (DRAPES) ×1 IMPLANT
DRAPE SLUSH/WARMER DISC (DRAPES) ×1 IMPLANT
DRAPE SRG 135X102X78XABS (DRAPES) ×3 IMPLANT
DRSG AQUACEL AG ADV 3.5X14 (GAUZE/BANDAGES/DRESSINGS) ×10 IMPLANT
ELECT BLADE 4.0 EZ CLEAN MEGAD (MISCELLANEOUS) ×4
ELECT CAUTERY BLADE 6.4 (BLADE) ×4 IMPLANT
ELECT REM PT RETURN 9FT ADLT (ELECTROSURGICAL) ×8
ELECTRODE BLDE 4.0 EZ CLN MEGD (MISCELLANEOUS) ×3 IMPLANT
ELECTRODE REM PT RTRN 9FT ADLT (ELECTROSURGICAL) ×6 IMPLANT
FELT TEFLON 1X6 (MISCELLANEOUS) ×4 IMPLANT
FILTER SMOKE EVAC ULPA (FILTER) ×4 IMPLANT
GAUZE SPONGE 4X4 12PLY STRL (GAUZE/BANDAGES/DRESSINGS) ×8 IMPLANT
GEL ULTRASOUND 20GR AQUASONIC (MISCELLANEOUS) ×1 IMPLANT
GLOVE BIO SURGEON STRL SZ 6 (GLOVE) ×4 IMPLANT
GLOVE BIO SURGEON STRL SZ 6.5 (GLOVE) ×14 IMPLANT
GLOVE BIOGEL M STRL SZ7.5 (GLOVE) ×3 IMPLANT
GLOVE BIOGEL PI IND STRL 6 (GLOVE) IMPLANT
GLOVE BIOGEL PI INDICATOR 6 (GLOVE) ×1
GLOVE SS BIOGEL STRL SZ 6 (GLOVE) IMPLANT
GLOVE SS BIOGEL STRL SZ 6.5 (GLOVE) IMPLANT
GLOVE SS BIOGEL STRL SZ 7.5 (GLOVE) IMPLANT
GLOVE SUPERSENSE BIOGEL SZ 6 (GLOVE) ×2
GLOVE SUPERSENSE BIOGEL SZ 6.5 (GLOVE) ×5
GLOVE SUPERSENSE BIOGEL SZ 7.5 (GLOVE) ×1
GOWN STRL REUS W/ TWL LRG LVL3 (GOWN DISPOSABLE) ×12 IMPLANT
GOWN STRL REUS W/ TWL XL LVL3 (GOWN DISPOSABLE) IMPLANT
GOWN STRL REUS W/TWL LRG LVL3 (GOWN DISPOSABLE) ×28
GOWN STRL REUS W/TWL XL LVL3 (GOWN DISPOSABLE) ×4
HEMOSTAT POWDER KIT SURGIFOAM (HEMOSTASIS) ×14 IMPLANT
HEMOSTAT POWDER SURGIFOAM 1G (HEMOSTASIS) ×5 IMPLANT
HEMOSTAT SURGICEL 2X14 (HEMOSTASIS) ×1 IMPLANT
INSERT FOGARTY XLG (MISCELLANEOUS) ×1 IMPLANT
KIT BASIN OR (CUSTOM PROCEDURE TRAY) ×4 IMPLANT
KIT SUCTION CATH 14FR (SUCTIONS) ×6 IMPLANT
KIT TURNOVER KIT B (KITS) ×4 IMPLANT
KIT VASOVIEW HEMOPRO 2 VH 4000 (KITS) ×3 IMPLANT
LEAD PACING MYOCARDI (MISCELLANEOUS) ×4 IMPLANT
MARKER GRAFT CORONARY BYPASS (MISCELLANEOUS) ×12 IMPLANT
NS IRRIG 1000ML POUR BTL (IV SOLUTION) ×18 IMPLANT
PACK E OPEN HEART (SUTURE) ×4 IMPLANT
PACK OPEN HEART (CUSTOM PROCEDURE TRAY) ×4 IMPLANT
PAD ARMBOARD 7.5X6 YLW CONV (MISCELLANEOUS) ×8 IMPLANT
PAD ELECT DEFIB RADIOL ZOLL (MISCELLANEOUS) ×4 IMPLANT
PENCIL BUTTON HOLSTER BLD 10FT (ELECTRODE) ×4 IMPLANT
PENCIL SMOKE EVACUATOR (MISCELLANEOUS) ×4 IMPLANT
POSITIONER HEAD DONUT 9IN (MISCELLANEOUS) ×4 IMPLANT
PUNCH AORTIC ROTATE  4.5MM 8IN (MISCELLANEOUS) ×1 IMPLANT
PUNCH AORTIC ROTATE 4.5MM 8IN (MISCELLANEOUS) ×1 IMPLANT
SET CARDIOPLEGIA MPS 5001102 (MISCELLANEOUS) ×1 IMPLANT
SHEARS HARMONIC 9CM CVD (BLADE) ×4 IMPLANT
SLEEVE SUCTION 125 (MISCELLANEOUS) ×4 IMPLANT
SOL ANTI FOG 6CC (MISCELLANEOUS) ×3 IMPLANT
SOLUTION ANTI FOG 6CC (MISCELLANEOUS) ×1
SPONGE LAP 18X18 RF (DISPOSABLE) ×5 IMPLANT
SPONGE LAP 4X18 RFD (DISPOSABLE) ×1 IMPLANT
SURGIFLO W/THROMBIN 8M KIT (HEMOSTASIS) ×1 IMPLANT
SUT BONE WAX W31G (SUTURE) ×5 IMPLANT
SUT ETHIBOND 2 0 SH (SUTURE) ×4
SUT ETHIBOND 2 0 SH 36X2 (SUTURE) IMPLANT
SUT PROLENE 3 0 SH 1 (SUTURE) ×4 IMPLANT
SUT PROLENE 4 0 RB 1 (SUTURE) ×20
SUT PROLENE 4 0 TF (SUTURE) ×8 IMPLANT
SUT PROLENE 4-0 RB1 .5 CRCL 36 (SUTURE) IMPLANT
SUT PROLENE 6 0 C 1 30 (SUTURE) ×4 IMPLANT
SUT PROLENE 6 0 CC (SUTURE) ×8 IMPLANT
SUT PROLENE 7 0 BV1 MDA (SUTURE) ×5 IMPLANT
SUT PROLENE 7.0 RB 3 (SUTURE) ×5 IMPLANT
SUT PROLENE 8 0 BV175 6 (SUTURE) ×2 IMPLANT
SUT SILK  1 MH (SUTURE) ×4
SUT SILK 1 MH (SUTURE) IMPLANT
SUT SILK 2 0 SH CR/8 (SUTURE) ×2 IMPLANT
SUT STEEL 6MS V (SUTURE) ×4 IMPLANT
SUT STEEL SZ 6 DBL 3X14 BALL (SUTURE) ×4 IMPLANT
SUT VIC AB 1 CTX 18 (SUTURE) ×8 IMPLANT
SUT VIC AB 3-0 SH 27 (SUTURE)
SUT VIC AB 3-0 SH 27X BRD (SUTURE) IMPLANT
SYR 50ML SLIP (SYRINGE) IMPLANT
SYSTEM SAHARA CHEST DRAIN ATS (WOUND CARE) ×5 IMPLANT
TAPE CLOTH SOFT 2X10 (GAUZE/BANDAGES/DRESSINGS) ×1 IMPLANT
TAPE CLOTH SURG 4X10 WHT LF (GAUZE/BANDAGES/DRESSINGS) ×1 IMPLANT
TOWEL GREEN STERILE (TOWEL DISPOSABLE) ×4 IMPLANT
TOWEL GREEN STERILE FF (TOWEL DISPOSABLE) ×4 IMPLANT
TRAP FLUID SMOKE EVACUATOR (MISCELLANEOUS) ×4 IMPLANT
TRAY FOLEY SLVR 16FR TEMP STAT (SET/KITS/TRAYS/PACK) ×4 IMPLANT
TUBE FEEDING 8FR 16IN STR KANG (MISCELLANEOUS) ×4 IMPLANT
TUBING LAP HI FLOW INSUFFLATIO (TUBING) ×3 IMPLANT
UNDERPAD 30X36 HEAVY ABSORB (UNDERPADS AND DIAPERS) ×4 IMPLANT
WATER STERILE IRR 1000ML POUR (IV SOLUTION) ×8 IMPLANT

## 2020-05-27 NOTE — Transfer of Care (Signed)
Immediate Anesthesia Transfer of Care Note  Patient: Dave Robles  Procedure(s) Performed: REDO CORONARY ARTERY BYPASS GRAFTING (CABG), ON PUMP, TIMES TWO, USING RIGHT INTERNAL MAMMARY ARTERY AND LEFT RADIAL ARTERY (OPEN) (N/A Chest) RADIAL ARTERY HARVEST (Left Arm Lower) TRANSESOPHAGEAL ECHOCARDIOGRAM (TEE) (N/A ) MEDIAN STERNOTOMY (N/A )  Patient Location: ICU  Anesthesia Type:General  Level of Consciousness: Patient remains intubated per anesthesia plan  Airway & Oxygen Therapy: Patient remains intubated per anesthesia plan and Patient placed on Ventilator (see vital sign flow sheet for setting)  Post-op Assessment: Report given to RN and Post -op Vital signs reviewed and stable  Post vital signs: Reviewed and stable  Last Vitals:  Vitals Value Taken Time  BP 104/62 05/27/20 1840  Temp 35.6 C 05/27/20 1850  Pulse 120 05/27/20 1850  Resp 21 05/27/20 1850  SpO2 98 % 05/27/20 1850  Vitals shown include unvalidated device data.  Last Pain:  Vitals:   05/27/20 0620  TempSrc: Oral  PainSc: 0-No pain         Complications: No complications documented.

## 2020-05-27 NOTE — Anesthesia Procedure Notes (Signed)
Arterial Line Insertion Start/End7/13/2021 6:40 AM, 05/27/2020 6:45 AM Performed by: Lillia Abed, MD, Mariea Clonts, CRNA, CRNA  Patient location: Pre-op. Preanesthetic checklist: patient identified, IV checked, site marked, risks and benefits discussed, surgical consent, monitors and equipment checked, pre-op evaluation, timeout performed and anesthesia consent Lidocaine 1% used for infiltration Right, radial was placed Catheter size: 20 Fr Hand hygiene performed  and maximum sterile barriers used   Attempts: 1 Procedure performed without using ultrasound guided technique. Following insertion, dressing applied. Post procedure assessment: normal and unchanged  Patient tolerated the procedure well with no immediate complications. Additional procedure comments: Placed by Rexford Maus, SRNA.

## 2020-05-27 NOTE — Brief Op Note (Addendum)
      [address]       [place] [address]             [phone]      05/27/2020  4:22 PM  PATIENT:  Alanda Slim Ezzell  74 y.o. male  PRE-OPERATIVE DIAGNOSIS:  Cornary Artery Disease  POST-OPERATIVE DIAGNOSIS:  Coronary Artery Disease  PROCEDURE:  Procedure(s) with comments: REDO CORONARY ARTERY BYPASS GRAFTING (CABG), ON PUMP, TIMES TWO, USING RIGHT INTERNAL MAMMARY ARTERY AND LEFT RADIAL ARTERY (OPEN) (N/A) - Left Radial Artery to PDA FREE RIMA to OM RADIAL ARTERY HARVEST (Left) TRANSESOPHAGEAL ECHOCARDIOGRAM (TEE) (N/A) MEDIAN STERNOTOMY (N/A) - Redo Median Sternotomy  SURGEON:  Surgeon(s) and Role:    * Grace Isaac, MD - Primary  PHYSICIAN ASSISTANT: WAYNE GOLD PA-C  ANESTHESIA:   general  EBL:  500 mL   BLOOD ADMINISTERED:none  DRAINS: PLEURAL AND PERICARDIAL CHEST DRAINS   LOCAL MEDICATIONS USED:  NONE  SPECIMEN:  No Specimen  DISPOSITION OF SPECIMEN:  N/A  COUNTS:  YES   DICTATION: .Other Dictation: Dictation Number PENDING  PLAN OF CARE: Admit to inpatient   PATIENT DISPOSITION:  ICU - intubated and hemodynamically stable.   Delay start of Pharmacological VTE agent (>24hrs) due to surgical blood loss or risk of bleeding: yes  COMPLICATIONS: NO KNOWN  cryoprecipitate and Platelets given in OR based on TEG findings

## 2020-05-27 NOTE — Progress Notes (Signed)
Vent changes made per Dr. Servando Snare.

## 2020-05-27 NOTE — Anesthesia Preprocedure Evaluation (Signed)
Anesthesia Evaluation  Patient identified by MRN, date of birth, ID band Patient awake    Reviewed: Allergy & Precautions, NPO status   Airway Mallampati: I  TM Distance: >3 FB Neck ROM: Full    Dental   Pulmonary former smoker,    Pulmonary exam normal        Cardiovascular hypertension, Pt. on medications + CAD, + Past MI and + CABG  Normal cardiovascular exam     Neuro/Psych Depression    GI/Hepatic   Endo/Other    Renal/GU      Musculoskeletal   Abdominal   Peds  Hematology   Anesthesia Other Findings   Reproductive/Obstetrics                             Anesthesia Physical Anesthesia Plan  ASA: III  Anesthesia Plan: General   Post-op Pain Management:    Induction: Intravenous  PONV Risk Score and Plan: 2 and Treatment may vary due to age or medical condition  Airway Management Planned: Oral ETT  Additional Equipment: Arterial line, PA Cath, TEE and Ultrasound Guidance Line Placement  Intra-op Plan:   Post-operative Plan: Post-operative intubation/ventilation  Informed Consent: I have reviewed the patients History and Physical, chart, labs and discussed the procedure including the risks, benefits and alternatives for the proposed anesthesia with the patient or authorized representative who has indicated his/her understanding and acceptance.       Plan Discussed with: Surgeon and CRNA  Anesthesia Plan Comments:         Anesthesia Quick Evaluation

## 2020-05-27 NOTE — Anesthesia Postprocedure Evaluation (Signed)
Anesthesia Post Note  Patient: Dave Robles  Procedure(s) Performed: REDO CORONARY ARTERY BYPASS GRAFTING (CABG), ON PUMP, TIMES TWO, USING RIGHT INTERNAL MAMMARY ARTERY AND LEFT RADIAL ARTERY (OPEN) (N/A Chest) RADIAL ARTERY HARVEST (Left Arm Lower) TRANSESOPHAGEAL ECHOCARDIOGRAM (TEE) (N/A ) MEDIAN STERNOTOMY (N/A )     Patient location during evaluation: SICU Anesthesia Type: General Level of consciousness: sedated Pain management: pain level controlled Vital Signs Assessment: post-procedure vital signs reviewed and stable Respiratory status: patient remains intubated per anesthesia plan Cardiovascular status: stable Postop Assessment: no apparent nausea or vomiting Anesthetic complications: no   No complications documented.  Last Vitals:  Vitals:   05/27/20 1837 05/27/20 1855  BP: 104/62   Pulse: 66   Resp: (!) 21   Temp:    SpO2: 99% 98%    Last Pain:  Vitals:   05/27/20 0620  TempSrc: Oral  PainSc: 0-No pain                 Aleanna Menge S

## 2020-05-27 NOTE — Anesthesia Procedure Notes (Signed)
Central Venous Catheter Insertion Performed by: Roberts Gaudy, MD, anesthesiologist Start/End7/13/2021 5:40 AM, 05/27/2020 5:50 AM Patient location: Pre-op. Preanesthetic checklist: patient identified, IV checked, site marked, risks and benefits discussed, surgical consent, monitors and equipment checked, pre-op evaluation, timeout performed and anesthesia consent Lidocaine 1% used for infiltration and patient sedated Hand hygiene performed  and maximum sterile barriers used  Catheter size: 9 Fr Sheath introducer Procedure performed using ultrasound guided technique. Ultrasound Notes:anatomy identified, needle tip was noted to be adjacent to the nerve/plexus identified, no ultrasound evidence of intravascular and/or intraneural injection and image(s) printed for medical record Attempts: 1 Following insertion, line sutured and dressing applied. Post procedure assessment: blood return through all ports, free fluid flow and no air  Patient tolerated the procedure well with no immediate complications.

## 2020-05-27 NOTE — Anesthesia Procedure Notes (Signed)
Procedure Name: Intubation Date/Time: 05/27/2020 7:47 AM Performed by: Mariea Clonts, CRNA Pre-anesthesia Checklist: Patient identified, Emergency Drugs available, Suction available and Patient being monitored Patient Re-evaluated:Patient Re-evaluated prior to induction Oxygen Delivery Method: Circle System Utilized Preoxygenation: Pre-oxygenation with 100% oxygen Induction Type: IV induction Ventilation: Mask ventilation without difficulty Laryngoscope Size: Miller and 2 Grade View: Grade I Tube type: Oral Tube size: 8.0 mm Number of attempts: 1 Airway Equipment and Method: Stylet and Oral airway Placement Confirmation: ETT inserted through vocal cords under direct vision,  positive ETCO2 and breath sounds checked- equal and bilateral Secured at: 22 cm Tube secured with: Tape Dental Injury: Teeth and Oropharynx as per pre-operative assessment

## 2020-05-27 NOTE — Anesthesia Procedure Notes (Signed)
Central Venous Catheter Insertion Performed by: Roberts Gaudy, MD, anesthesiologist Start/End7/13/2021 6:40 AM, 05/27/2020 6:50 AM Patient location: Pre-op. Preanesthetic checklist: patient identified, IV checked, site marked, risks and benefits discussed, surgical consent, monitors and equipment checked, pre-op evaluation, timeout performed and anesthesia consent Position: supine Hand hygiene performed  and maximum sterile barriers used  PA cath was placed.Swan type:thermodilution Procedure performed without using ultrasound guided technique. Ultrasound Notes:anatomy identified, needle tip was noted to be adjacent to the nerve/plexus identified and no ultrasound evidence of intravascular and/or intraneural injection Attempts: 1 Following insertion, line sutured, dressing applied and Biopatch. Patient tolerated the procedure well with no immediate complications.

## 2020-05-27 NOTE — Progress Notes (Signed)
[facility] BRIEF SICU PROGRESS NOTE  Day of Surgery  S/P Procedure(s) (LRB): REDO CORONARY ARTERY BYPASS GRAFTING (CABG), ON PUMP, TIMES TWO, USING RIGHT INTERNAL MAMMARY ARTERY AND LEFT RADIAL ARTERY (OPEN) (N/A) RADIAL ARTERY HARVEST (Left) TRANSESOPHAGEAL ECHOCARDIOGRAM (TEE) (N/A) MEDIAN STERNOTOMY (N/A)   Sedated on vent NSR w/ stable hemodynamics although PA pressures relatively low and on levophed for BP support Chest tube output < 100 mL initial 30 minutes after return from [place] and CXR pending  Plan: Continue routine early postop - needs volume  Rexene Alberts, MD 05/27/2020 7:04 PM

## 2020-05-27 NOTE — H&P (Signed)
[address]       [place] [address]             [phone]                    Dave Robles Clancy Medical Record #[id number] Date of Birth: 08/10/1946  Referring: Dr Daneen Schick Primary Care: Dorothyann Peng, NP Primary Cardiologist: No primary care provider on file.  Chief Complaint:    Chief Complaint  Patient presents with  . Coronary Artery Disease    new patient consulation, redo CABG    History of Present Illness:    Dave Robles 74 y.o. male was seen in the office   for second opinion regarding redo coronary artery bypass grafting.  The patient recently underwent outpatient cardiac catheterization Dr. Tamala Julian.  This was precipitated by increasing dyspnea on exertion.  This is usually brought on with riding his bicycle especially going uphill.. He has a long history of known coronary artery disease.  He developed unstable angina in August 1991 while traveling, returned home and ultimately underwent coronary artery bypass grafting x3 by Dr. Redmond Pulling with the left internal mammary to the left anterior descending coronary artery reverse saphenous vein graft to the diagonal) reverse saphenous vein graft to the distal right coronary artery with vein harvest from the left lower leg.  Patient went 25 years with no cardiac symptoms.  5 years ago while in [place] he was admitted to the hospital diagnosed with myocardial infarction based on enzymes, repeat catheterization and a stent placed in the proximal right coronary artery graft.   Is been maintained on statin, Plavix, and 81 mg of aspirin.  Last year his aspirin was discontinued because of increased bruising.  Because of recurrent anginal equivalent symptoms primarily dyspnea on exertion the patient underwent repeat cardiac catheterization by Dr. Tamala Julian demonstrating preserved LV function, in-stent stenosis of the stent in the proximal right coronary artery, progression of disease in the proximal circumflex  and diagonal.  The mammary artery is large and patent with collateral filling to the right.     Patient has no history of diabetes, he was a smoker until his bypass surgery in 1991 and has not smoked since.  Medical records reports the old op note was destroyed   Current Activity/ Functional Status:  Patient is independent with mobility/ambulation, transfers, ADL's, IADL's.   Zubrod Score: At the time of surgery this patient's most appropriate activity status/level should be described as: []     0    Normal activity, no symptoms [x]     1    Restricted in physical strenuous activity but ambulatory, able to do out light work []     2    Ambulatory and capable of self care, unable to do work activities, up and about               >50 % of waking hours                              []     3    Only limited self care, in bed greater than 50% of waking hours []     4    Completely disabled, no self care, confined to bed or chair []     5    Moribund   Past Medical History:  Diagnosis Date  . AAA (abdominal aortic aneurysm) ([place])   .  Adjustment disorder with depressed mood 03/04/2009  . Arthritis   . Colon polyps   . CORONARY ARTERY DISEASE 07/10/2007  . Depression   . ERECTILE DYSFUNCTION 07/10/2007  . Extrinsic asthma, unspecified 09/10/2008  . HYPERLIPIDEMIA 07/10/2007  . HYPERTENSION 07/10/2007  . Myocardial infarction [facility]) 2016   when had angioplasty- was told had a silent MI  . TOBACCO USE 12/15/2009    Past Surgical History:  Procedure Laterality Date  . COLONOSCOPY    . CORONARY ANGIOPLASTY  12/2014  . CORONARY ARTERY BYPASS GRAFT  11/15/89   5 vessels  . HYDROCELE EXCISION / REPAIR  2011  . INGUINAL HERNIA REPAIR Left 1985  . LEFT HEART CATH AND CORS/GRAFTS ANGIOGRAPHY N/A 05/13/2020   Procedure: LEFT HEART CATH AND CORS/GRAFTS ANGIOGRAPHY;  Surgeon: Belva Crome, MD;  Location: [facility];  Service: Cardiovascular;  Laterality: N/A;  . TOTAL HIP ARTHROPLASTY Right  04/19/2018   Procedure: RIGHT TOTAL HIP ARTHROPLASTY ANTERIOR APPROACH;  Surgeon: Gaynelle Arabian, MD;  Location: [facility];  Service: Orthopedics;  Laterality: Right;    Family History  Problem Relation Age of Onset  . Coronary artery disease Father        cabg  . Cancer Mother        Breast and or lung   . Depression Mother   . Hyperlipidemia Other   . Hypertension Other   . Depression Paternal Grandfather   . Colon cancer Neg Hx    Family history is significant for his father who had bypass surgery in 22 at age 12 died in the postop.  [facility]  Patient's mother died of lung cancer and had history of breast cancer He has 1 brother 1 sister neither who have cardiac disease he has no children  Social History   Tobacco Use  Smoking Status Former Smoker  . Packs/day: 0.50  . Quit date: 30  . Years since quitting: 30.5  Smokeless Tobacco Never Used    Social History   Substance and Sexual Activity  Alcohol Use Yes  . Alcohol/week: 1.0 standard drink  . Types: 1 Cans of beer per week   Comment: Gin 2 x week     No Known Allergies  Current Facility-Administered Medications  Medication Dose Route Frequency Provider Last Rate Last Admin  . cefUROXime (ZINACEF) 1.5 g in sodium chloride 0.9 % 100 mL IVPB  1.5 g Intravenous To OR Grace Isaac, MD      . cefUROXime (ZINACEF) 750 mg in sodium chloride 0.9 % 100 mL IVPB  750 mg Intravenous To OR Grace Isaac, MD      . chlorhexidine (HIBICLENS) 4 % liquid 2 application  30 mL Topical UD Grace Isaac, MD      . Derrill Memo ON 05/28/2020] chlorhexidine (PERIDEX) 0.12 % solution 15 mL  15 mL Mouth/Throat Once Grace Isaac, MD      . dexmedetomidine (PRECEDEX) 400 MCG/100ML (4 mcg/mL) infusion  0.1-0.7 mcg/kg/hr Intravenous To OR Grace Isaac, MD      . EPINEPHrine (ADRENALIN) 4 mg in NS 250 mL (0.016 mg/mL) premix infusion  0-10 mcg/min Intravenous To OR Grace Isaac, MD      . heparin  30,000 units/NS 1000 mL solution for CELLSAVER   Other To OR Grace Isaac, MD      . heparin sodium (porcine) 2,500 Units, papaverine 30 mg in electrolyte-148 (PLASMALYTE-148) 500 mL irrigation   Irrigation To OR Grace Isaac, MD      .  insulin regular, human (MYXREDLIN) 100 units/ 100 mL infusion   Intravenous To OR Grace Isaac, MD      . magnesium sulfate (IV Push/IM) injection 40 mEq  40 mEq Other To OR Grace Isaac, MD      . metoprolol tartrate (LOPRESSOR) tablet 12.5 mg  12.5 mg Oral Once Grace Isaac, MD      . milrinone (PRIMACOR) 20 MG/100 ML (0.2 mg/mL) infusion  0.3 mcg/kg/min Intravenous To OR Grace Isaac, MD      . nitroGLYCERIN 50 mg in dextrose 5 % 250 mL (0.2 mg/mL) infusion  2-200 mcg/min Intravenous To OR Grace Isaac, MD      . norepinephrine (LEVOPHED) 4mg  in 290mL premix infusion  0-40 mcg/min Intravenous To OR Grace Isaac, MD      . phenylephrine (NEOSYNEPHRINE) 20-0.9 MG/250ML-% infusion  30-200 mcg/min Intravenous To OR Grace Isaac, MD      . potassium chloride injection 80 mEq  80 mEq Other To OR Grace Isaac, MD      . tranexamic acid (CYKLOKAPRON) 2,500 mg in sodium chloride 0.9 % 250 mL (10 mg/mL) infusion  1.5 mg/kg/hr Intravenous To OR Grace Isaac, MD      . tranexamic acid (CYKLOKAPRON) bolus via infusion - over 30 minutes 1,554 mg  15 mg/kg Intravenous To OR Grace Isaac, MD      . tranexamic acid (CYKLOKAPRON) pump prime solution 207 mg  2 mg/kg Intracatheter To OR Grace Isaac, MD      . vancomycin (VANCOREADY) IVPB 1500 mg/300 mL  1,500 mg Intravenous To OR Grace Isaac, MD       Facility-Administered Medications Ordered in Other Encounters  Medication Dose Route Frequency Provider Last Rate Last Admin  . fentaNYL citrate (PF) (SUBLIMAZE) injection   Intravenous Anesthesia Intra-op Mariea Clonts, CRNA   100 mcg at 05/27/20 0640  . midazolam PF (VERSED) injection    Intravenous Anesthesia Intra-op Mariea Clonts, CRNA   2 mg at 05/27/20 0640    Pertinent items are noted in HPI.   Review of Systems:     Cardiac Review of Systems: [Y] = yes  or   [ N ] = no   Chest Pain [  n  ]  Resting SOB [ n  ] Exertional SOB  [ y ]  Vertell Limber Florencio.Farrier  ]   Pedal Edema [ n  ]    Palpitations [ n ] Syncope  [  n]   Presyncope [  n ]   General Review of Systems: [Y] = yes [  ]=no Constitional: recent weight change [5-7 lbs  ];  Wt loss over the last 3 months [   ] anorexia [  ]; fatigue [ y ]; nausea [  ]; night sweats [  ]; fever [  ]; or chills [  ];           Eye : blurred vision [  ]; diplopia [   ]; vision changes [  ];  Amaurosis fugax[  ]; Resp: cough [  ];  wheezing[  ];  hemoptysis[  ]; shortness of breath[y  ]; paroxysmal nocturnal dyspnea[  ]; dyspnea on exertion[ y ]; or orthopnea[  ];  GI:  gallstones[  ], vomiting[  ];  dysphagia[  ]; melena[  ];  hematochezia [  ]; heartburn[  ];   Hx of  Colonoscopy[  ]; GU: kidney stones [  ];  hematuria[  ];   dysuria [  ];  nocturia[  ];  history of     obstruction [  ]; urinary frequency [  ]             Skin: rash, swelling[  ];, hair loss[  ];  peripheral edema[  ];  or itching[  ]; Musculosketetal: myalgias[  ];  joint swelling[  ];  joint erythema[  ];  joint pain[  ];  back pain[  ];  Heme/Lymph: bruising[  ];  bleeding[  ];  anemia[  ];  Neuro: TIA[  ];  headaches[  ];  stroke[  ];  vertigo[  ];  seizures[  ];   paresthesias[  ];  difficulty walking[  ];  Psych:depression[  ]; anxiety[  ];  Endocrine: diabetes[ n ];  thyroid dysfunction[ n ];  Immunizations: Flu up to date Blue.Reese  ]; Pneumococcal up to date [y  ];covid x2   Other:     PHYSICAL EXAMINATION: BP (!) 174/78   Pulse (!) 57   Temp 97.9 F (36.6 C) (Oral)   Resp 18  General appearance: alert, cooperative, appears stated age and no distress Head: Normocephalic, without obvious abnormality, atraumatic Neck: no adenopathy, no carotid bruit, no JVD,  supple, symmetrical, trachea midline and thyroid not enlarged, symmetric, no tenderness/mass/nodules Lymph nodes: Cervical, supraclavicular, and axillary nodes normal. Resp: clear to auscultation bilaterally Back: symmetric, no curvature. ROM normal. No CVA tenderness. Cardio: regular rate and rhythm, S1, S2 normal, no murmur, click, rub or gallop GI: soft, non-tender; bowel sounds normal; no masses,  no organomegaly Extremities: extremities normal, atraumatic, no cyanosis or edema, Homans sign is negative, no sign of DVT and Vein harvest site left lower leg Neurologic: Grossly normal Positive bilateral modified Allen's test Right chest site with some superficial bruising no palpable hematoma-dressings were removed  Diagnostic Studies & Laboratory data:     Recent Radiology Findings:   DG Chest 2 View  Result Date: 05/23/2020 CLINICAL DATA:  Patient having CABG on Tuesday May 27, 2020. EXAM: CHEST - 2 VIEW COMPARISON:  April 30, 2020 FINDINGS: The heart size and mediastinal contours are stable. Both lungs are clear. The visualized skeletal structures are stable. IMPRESSION: No active cardiopulmonary disease. Electronically Signed   By: Abelardo Diesel M.D.   On: 05/23/2020 08:55   DG Chest 2 View  Result Date: 04/30/2020 CLINICAL DATA:  74 year old male with history of dyspnea on exertion. Productive cough and shortness of breath for the past 10 days. EXAM: CHEST - 2 VIEW COMPARISON:  Chest x-ray 11/25/2015. FINDINGS: Lung volumes are normal. No consolidative airspace disease. No pleural effusions. No pneumothorax. Small calcified granulomas in the right lung. No no other suspicious appearing pulmonary nodule or mass noted. Pulmonary vasculature and the cardiomediastinal silhouette are within normal limits. Atherosclerosis in the thoracic aorta. Status post median sternotomy for CABG. IMPRESSION: 1.  No radiographic evidence of acute cardiopulmonary disease. 2. Aortic atherosclerosis. Electronically  Signed   By: Vinnie Langton M.D.   On: 04/30/2020 15:05   CT Chest Wo Contrast  Result Date: 05/14/2020 CLINICAL DATA:  Chest pain.  Coronary artery disease.  CABG. EXAM: CT CHEST WITHOUT CONTRAST TECHNIQUE: Multidetector CT imaging of the chest was performed following the standard protocol without IV contrast. COMPARISON:  Chest x-ray dated 04/30/2020 FINDINGS: Cardiovascular: Aortic atherosclerosis. Coronary artery calcifications. CABG. Heart size is normal. No pericardial effusion. Mediastinum/Nodes: No enlarged mediastinal or axillary lymph nodes. Thyroid gland, trachea, and esophagus demonstrate no  significant findings. Lungs/Pleura: None mm calcified granuloma anteromedial aspect of the right upper lobe. The lungs are otherwise clear. No effusions. Upper Abdomen: No acute abnormality. Musculoskeletal: Osteophytes fuse the thoracic spine from T2 through L1. Does the patient have ankylosing spondylitis? IMPRESSION: 1. No acute abnormalities of the chest. 2. Aortic atherosclerosis. 3. Osteophytes fuse the thoracic spine from T2 through L1. Does the patient have ankylosing spondylitis. Aortic Atherosclerosis (ICD10-I70.0). Electronically Signed   By: Lorriane Shire M.D.   On: 05/14/2020 12:36   CARDIAC CATHETERIZATION  Result Date: 05/13/2020  Total occlusion of the mid LAD, total occlusion of the mid RCA, and high-grade calcified obstruction in the ostial to proximal circumflex with diffuse 50% left main stenosis.  Patent saphenous vein graft to the diagonal with 70 to 80% stenosis distal to the graft insertion site.  The diagonal fills retrogradely into the first septal perforator and the proximal LAD segment.  Diffuse 95% in-stent restenosis in the ostial to proximal saphenous vein graft to the distal RCA.  Normal LV function.  LVEDP is normal.  EF is estimated to be 60%. RECOMMENDATIONS:  Critical decision point for this patient who had coronary bypass surgery in 1991, and now has diffuse  in-stent restenosis in the ostium and proximal segment of the saphenous vein graft to the right coronary.  The native circumflex is heavily calcified and has critical stenosis.  The diagonal beyond the saphenous vein graft insertion site has significant disease.  Long-term patency of the right coronary graft will be markedly reduced PCI and restenting.  The technical standpoint it appears that the stent extends outside the ostium of the bypass graft.  The right coronary territory is large.  The patient is relatively healthy.  His best long-term approach may be repeat coronary bypass surgery.  Before starting down the path of stent implantation, will  refer for heart team evaluation to consider surgical revascularization.  If deemed not a surgical candidate or if the patient prefers PCI, this could be attempted but will carry higher technical risk and likelihood of repeat revascularization.  ECHOCARDIOGRAM COMPLETE  Result Date: 05/24/2020    ECHOCARDIOGRAM REPORT   Patient Name:   JEREMIA GROOT [facility] Date of Exam: 05/23/2020 Medical Rec #:  [id number]        Height:       70.0 in Accession #:    [id number]       Weight:       231.0 lb Date of Birth:  November 30, 1945        BSA:          2.219 m Patient Age:    74 years         BP:           149/84 mmHg Patient Gender: M                HR:           52 bpm. Exam Location:  Inpatient Procedure: 2D Echo, Color Doppler and Cardiac Doppler Indications:    CAD                 Pre-op evaluation  History:        Patient has prior history of Echocardiogram examinations, most                 recent 04/07/2015. CAD; Risk Factors:Hypertension and                 Dyslipidemia. DOE.  Sonographer:  Clayton Lefort RDCS (AE) Referring Phys: [id number] Lilia Argue Ellary Casamento IMPRESSIONS  1. Left ventricular ejection fraction, by estimation, is 60 to 65%. The left ventricle has normal function. The left ventricle has no regional wall motion abnormalities. There is moderate left ventricular hypertrophy.  Left ventricular diastolic parameters were normal.  2. Right ventricular systolic function is mildly reduced. The right ventricular size is normal. Tricuspid regurgitation signal is inadequate for assessing PA pressure.  3. Left atrial size was moderately dilated.  4. The mitral valve is normal in structure. Trivial mitral valve regurgitation.  5. The aortic valve is tricuspid. Aortic valve regurgitation is not visualized. Mild to moderate aortic valve sclerosis/calcification is present, without any evidence of aortic stenosis.  6. The inferior vena cava is normal in size with greater than 50% respiratory variability, suggesting right atrial pressure of 3 mmHg. FINDINGS  Left Ventricle: Left ventricular ejection fraction, by estimation, is 60 to 65%. The left ventricle has normal function. The left ventricle has no regional wall motion abnormalities. The left ventricular internal cavity size was normal in size. There is  moderate left ventricular hypertrophy. Left ventricular diastolic parameters were normal. Right Ventricle: The right ventricular size is normal. Right vetricular wall thickness was not assessed. Right ventricular systolic function is mildly reduced. Tricuspid regurgitation signal is inadequate for assessing PA pressure. Left Atrium: Left atrial size was moderately dilated. Right Atrium: Right atrial size was normal in size. Pericardium: Trivial pericardial effusion is present. Presence of pericardial fat pad. Mitral Valve: The mitral valve is normal in structure. Trivial mitral valve regurgitation. MV peak gradient, 2.6 mmHg. The mean mitral valve gradient is 1.0 mmHg. Tricuspid Valve: The tricuspid valve is normal in structure. Tricuspid valve regurgitation is trivial. Aortic Valve: The aortic valve is tricuspid. Aortic valve regurgitation is not visualized. Mild to moderate aortic valve sclerosis/calcification is present, without any evidence of aortic stenosis. Aortic valve mean gradient measures  4.0 mmHg. Aortic valve peak gradient measures 8.9 mmHg. Aortic valve area, by VTI measures 2.03 cm. Pulmonic Valve: The pulmonic valve was not well visualized. Pulmonic valve regurgitation is not visualized. Aorta: The aortic root is normal in size and structure. Venous: The inferior vena cava is normal in size with greater than 50% respiratory variability, suggesting right atrial pressure of 3 mmHg. IAS/Shunts: The interatrial septum was not well visualized.  LEFT VENTRICLE PLAX 2D LVIDd:         4.60 cm  Diastology LVIDs:         3.10 cm  LV e' lateral:   11.00 cm/s LV PW:         1.60 cm  LV E/e' lateral: 6.6 LV IVS:        1.50 cm  LV e' medial:    9.68 cm/s LVOT diam:     2.00 cm  LV E/e' medial:  7.5 LV SV:         66 LV SV Index:   30 LVOT Area:     3.14 cm  RIGHT VENTRICLE            IVC RV S prime:     7.73 cm/s  IVC diam: 1.60 cm TAPSE (M-mode): 1.4 cm LEFT ATRIUM             Index       RIGHT ATRIUM           Index LA diam:        3.70 cm 1.67 cm/m  RA Area:  18.70 cm LA Vol (A2C):   89.4 ml 40.29 ml/m RA Volume:   58.20 ml  26.23 ml/m LA Vol (A4C):   93.2 ml 42.00 ml/m LA Biplane Vol: 92.1 ml 41.50 ml/m  AORTIC VALVE AV Area (Vmax):    1.80 cm AV Area (Vmean):   1.80 cm AV Area (VTI):     2.03 cm AV Vmax:           149.00 cm/s AV Vmean:          94.900 cm/s AV VTI:            0.327 m AV Peak Grad:      8.9 mmHg AV Mean Grad:      4.0 mmHg LVOT Vmax:         85.50 cm/s LVOT Vmean:        54.500 cm/s LVOT VTI:          0.211 m LVOT/AV VTI ratio: 0.65  AORTA Ao Root diam: 3.40 cm MITRAL VALVE MV Area (PHT): 2.18 cm    SHUNTS MV Peak grad:  2.6 mmHg    Systemic VTI:  0.21 m MV Mean grad:  1.0 mmHg    Systemic Diam: 2.00 cm MV Vmax:       0.81 m/s MV Vmean:      41.7 cm/s MV Decel Time: 348 msec MV E velocity: 72.80 cm/s MV A velocity: 52.70 cm/s MV E/A ratio:  1.38 Oswaldo Milian MD Electronically signed by Oswaldo Milian MD Signature Date/Time: 05/24/2020/3:59:37 PM    Final     VAS US DOPPLER PRE CABG  Result Date: 05/23/2020 PREOPERATIVE VASCULAR EVALUATION  Indications:      Pre-CABG. Risk Factors:     Hypertension, hyperlipidemia. Comparison Study: No prior study Performing Technologist: Maudry Mayhew MHA, RVT, RDCS, RDMS  Examination Guidelines: A complete evaluation includes B-mode imaging, spectral Doppler, color Doppler, and power Doppler as needed of all accessible portions of each vessel. Bilateral testing is considered an integral part of a complete examination. Limited examinations for reoccurring indications may be performed as noted.  Right Carotid Findings: +----------+-------+-------+--------+---------------------------------+--------+           PSV    EDV    StenosisDescribe                         Comments           cm/s   cm/s                                                     +----------+-------+-------+--------+---------------------------------+--------+ CCA Prox  109    19                                                       +----------+-------+-------+--------+---------------------------------+--------+ CCA Distal100    19                                                       +----------+-------+-------+--------+---------------------------------+--------+ ICA Prox  100    17             heterogenous, irregular and                                               calcific                                  +----------+-------+-------+--------+---------------------------------+--------+ ICA Distal77     17                                                       +----------+-------+-------+--------+---------------------------------+--------+ ECA       183    20             calcific                                  +----------+-------+-------+--------+---------------------------------+--------+ Portions of this table do not appear on this page. +----------+--------+-------+----------------+------------+            PSV cm/sEDV cmsDescribe        Arm Pressure +----------+--------+-------+----------------+------------+ Subclavian172            Multiphasic, WNL             +----------+--------+-------+----------------+------------+ +---------+--------+--+--------+-+---------+ VertebralPSV cm/s38EDV cm/s9Antegrade +---------+--------+--+--------+-+---------+ Left Carotid Findings: +----------+--------+--------+--------+-------------------------+--------+           PSV cm/sEDV cm/sStenosisDescribe                 Comments +----------+--------+--------+--------+-------------------------+--------+ CCA Prox  144     23                                                +----------+--------+--------+--------+-------------------------+--------+ CCA Distal108     24                                                +----------+--------+--------+--------+-------------------------+--------+ ICA Prox  70      14              heterogenous and calcific         +----------+--------+--------+--------+-------------------------+--------+ ICA Distal96      29                                                +----------+--------+--------+--------+-------------------------+--------+ ECA       112     13                                                +----------+--------+--------+--------+-------------------------+--------+ +----------+--------+--------+--------------+------------+ SubclavianPSV cm/sEDV cm/sDescribe      Arm Pressure +----------+--------+--------+--------------+------------+  Not identified             +----------+--------+--------+--------------+------------+ +---------+--------+--+--------+--+---------+ VertebralPSV cm/s41EDV cm/s10Antegrade +---------+--------+--+--------+--+---------+  ABI Findings: +--------+------------------+-----+---------+--------+ Right   Rt Pressure (mmHg)IndexWaveform Comment   +--------+------------------+-----+---------+--------+ [id number]                    triphasic         +--------+------------------+-----+---------+--------+ PTA                            triphasic         +--------+------------------+-----+---------+--------+ DP                             triphasic         +--------+------------------+-----+---------+--------+ +--------+------------------+-----+---------+-------+ Left    Lt Pressure (mmHg)IndexWaveform Comment +--------+------------------+-----+---------+-------+ [id number]                    triphasic        +--------+------------------+-----+---------+-------+ PTA                            triphasic        +--------+------------------+-----+---------+-------+ DP                             triphasic        +--------+------------------+-----+---------+-------+  Right Doppler Findings: +-----------+--------+-----+---------+--------------------+ Site       PressureIndexDoppler  Comments             +-----------+--------+-----+---------+--------------------+ Brachial   149          triphasic                     +-----------+--------+-----+---------+--------------------+ Radial                  triphasic                     +-----------+--------+-----+---------+--------------------+ Ulnar                   triphasic                     +-----------+--------+-----+---------+--------------------+ Palmar Arch                      Within normal limits +-----------+--------+-----+---------+--------------------+  Left Doppler Findings: +-----------+--------+-----+---------+--------------------+ Site       PressureIndexDoppler  Comments             +-----------+--------+-----+---------+--------------------+ Brachial   135          triphasic                     +-----------+--------+-----+---------+--------------------+ Radial                  triphasic                      +-----------+--------+-----+---------+--------------------+ Ulnar                   triphasic                     +-----------+--------+-----+---------+--------------------+ Palmar Arch                      Within normal limits +-----------+--------+-----+---------+--------------------+  Summary:  Right Carotid: Velocities in the right ICA are consistent with a 1-39% stenosis. Left Carotid: Velocities in the left ICA are consistent with a 1-39% stenosis. Vertebrals:  Bilateral vertebral arteries demonstrate antegrade flow. Subclavians: Normal flow hemodynamics were seen in bilateral subclavian              arteries. Right Upper Extremity: No significant arterial obstruction detected in the right upper extremity. Doppler waveforms remain within normal limits with right radial compression. Doppler waveforms remain within normal limits with right ulnar compression. Left Upper Extremity: No significant arterial obstruction detected in the left upper extremity. Doppler waveforms remain within normal limits with left radial compression. Doppler waveforms remain within normal limits with left ulnar compression.  Electronically signed by Harold Barban MD on 05/23/2020 at 5:16:36 PM.    Final      I have independently reviewed the above radiology studies  and reviewed the findings with the patient.   Recent Lab Findings: Lab Results  Component Value Date   WBC 7.6 05/23/2020   HGB 15.9 05/23/2020   HCT 46.8 05/23/2020   PLT 141 (L) 05/23/2020   GLUCOSE 108 (H) 05/23/2020   CHOL 119 01/10/2020   TRIG 90.0 01/10/2020   HDL 28.90 (L) 01/10/2020   LDLDIRECT 66.7 11/30/2011   LDLCALC 72 01/10/2020   ALT 38 05/23/2020   AST 26 05/23/2020   NA 139 05/23/2020   K 3.9 05/23/2020   CL 106 05/23/2020   CREATININE 1.20 05/23/2020   BUN 12 05/23/2020   CO2 24 05/23/2020   TSH 2.57 01/10/2020   INR 1.1 05/23/2020   HGBA1C 5.6 05/23/2020   CATH: Diagnostic Dominance: Right Left Main  Ost LM to  Dist LM lesion is 50% stenosed.  Left Anterior Descending  Ost LAD to Prox LAD lesion is 80% stenosed.  Prox LAD to Mid LAD lesion is 100% stenosed.  First Diagonal Branch  1st Diag lesion is 75% stenosed.  Left Circumflex  Ost Cx to Prox Cx lesion is 95% stenosed.  First Obtuse Marginal Branch  Vessel is small in size.  Second Obtuse Marginal Branch  Vessel is large in size.  Right Coronary Artery  Prox RCA to Mid RCA lesion is 100% stenosed.  LIMA Graft To Dist LAD  Graft To 1st Diag  Graft To RPDA  Origin to Prox Graft lesion is 95% stenosed. The lesion was previously treated.      Assessment / Plan:   #1 significant native three-vessel coronary artery disease with total occlusion of the right coronary artery and LAD, with high-grade proximal circumflex #2 status post coronary artery bypass grafting times 01/31/1990, with patent left internal mammary to the LAD proximal disease in the vein graft to the right coronary artery status post stenting with recurrent in-stent stenosis and progression of disease and diagonal coronary artery #3 preserved LV function  With the patient is otherwise good functional status and health status redo coronary artery bypass grafting offers the best option for full revascularization and to avoid early restenosis trying to attempt repeat stent placement in the right coronary artery. The goals risks and alternatives of the planned surgical procedure Procedure(s) with comments: REDO CORONARY ARTERY BYPASS GRAFTING (CABG) (N/A) - possible RIMA possible RADIAL ARTERY HARVEST (Left) TRANSESOPHAGEAL ECHOCARDIOGRAM (TEE) (N/A)  have been discussed with the patient in detail. The risks of the procedure including death, infection, stroke, myocardial infarction, bleeding, blood transfusion have all been discussed specifically. We discussed the use of radial artery including risks of vascular  or nural injury to hnd, patient is right handed   I have quoted [name] a 5 % of perioperative mortality and a complication rate as high as 40 %. The patient's questions have been answered.Dary Dilauro Rotan is willing  to proceed with the planned procedure.   Grace Isaac MD      [address] [place] [address] Office [phone]     05/27/2020 6:54 AM

## 2020-05-28 ENCOUNTER — Inpatient Hospital Stay (HOSPITAL_COMMUNITY): Payer: Medicare HMO

## 2020-05-28 ENCOUNTER — Ambulatory Visit: Payer: Medicare HMO | Admitting: Nurse Practitioner

## 2020-05-28 ENCOUNTER — Encounter (HOSPITAL_COMMUNITY): Payer: Self-pay | Admitting: Cardiothoracic Surgery

## 2020-05-28 LAB — POCT I-STAT 7, (LYTES, BLD GAS, ICA,H+H)
Acid-base deficit: 3 mmol/L — ABNORMAL HIGH (ref 0.0–2.0)
Acid-base deficit: 1 mmol/L (ref 0.0–2.0)
Acid-base deficit: 1 mmol/L (ref 0.0–2.0)
Acid-base deficit: 3 mmol/L — ABNORMAL HIGH (ref 0.0–2.0)
Acid-base deficit: 3 mmol/L — ABNORMAL HIGH (ref 0.0–2.0)
Bicarbonate: 23.6 mmol/L (ref 20.0–28.0)
Bicarbonate: 24.3 mmol/L (ref 20.0–28.0)
Bicarbonate: 23.2 mmol/L (ref 20.0–28.0)
Bicarbonate: 22.6 mmol/L (ref 20.0–28.0)
Bicarbonate: 22.8 mmol/L (ref 20.0–28.0)
Calcium, Ion: 1.11 mmol/L — ABNORMAL LOW (ref 1.15–1.40)
Calcium, Ion: 1.06 mmol/L — ABNORMAL LOW (ref 1.15–1.40)
Calcium, Ion: 1.07 mmol/L — ABNORMAL LOW (ref 1.15–1.40)
Calcium, Ion: 1.12 mmol/L — ABNORMAL LOW (ref 1.15–1.40)
Calcium, Ion: 1.11 mmol/L — ABNORMAL LOW (ref 1.15–1.40)
HCT: 22 % — ABNORMAL LOW (ref 39.0–52.0)
HCT: 16 % — ABNORMAL LOW (ref 39.0–52.0)
HCT: 18 % — ABNORMAL LOW (ref 39.0–52.0)
HCT: 20 % — ABNORMAL LOW (ref 39.0–52.0)
HCT: 19 % — ABNORMAL LOW (ref 39.0–52.0)
Hemoglobin: 7.5 g/dL — ABNORMAL LOW (ref 13.0–17.0)
Hemoglobin: 5.4 g/dL — CL (ref 13.0–17.0)
Hemoglobin: 6.1 g/dL — CL (ref 13.0–17.0)
Hemoglobin: 6.8 g/dL — CL (ref 13.0–17.0)
Hemoglobin: 6.5 g/dL — CL (ref 13.0–17.0)
O2 Saturation: 98 %
O2 Saturation: 100 %
O2 Saturation: 100 %
O2 Saturation: 99 %
O2 Saturation: 99 %
Patient temperature: 35.6
Patient temperature: 36.1
Patient temperature: 36.8
Patient temperature: 36.8
Patient temperature: 36.7
Potassium: 4.2 mmol/L (ref 3.5–5.1)
Potassium: 4.5 mmol/L (ref 3.5–5.1)
Potassium: 3.8 mmol/L (ref 3.5–5.1)
Potassium: 4 mmol/L (ref 3.5–5.1)
Potassium: 3.9 mmol/L (ref 3.5–5.1)
Sodium: 142 mmol/L (ref 135–145)
Sodium: 143 mmol/L (ref 135–145)
Sodium: 142 mmol/L (ref 135–145)
Sodium: 144 mmol/L (ref 135–145)
Sodium: 144 mmol/L (ref 135–145)
TCO2: 25 mmol/L (ref 22–32)
TCO2: 26 mmol/L (ref 22–32)
TCO2: 24 mmol/L (ref 22–32)
TCO2: 24 mmol/L (ref 22–32)
TCO2: 24 mmol/L (ref 22–32)
pCO2 arterial: 45.4 mmHg (ref 32.0–48.0)
pCO2 arterial: 38.7 mmHg (ref 32.0–48.0)
pCO2 arterial: 36.8 mmHg (ref 32.0–48.0)
pCO2 arterial: 41.9 mmHg (ref 32.0–48.0)
pCO2 arterial: 40.5 mmHg (ref 32.0–48.0)
pH, Arterial: 7.317 — ABNORMAL LOW (ref 7.350–7.450)
pH, Arterial: 7.402 (ref 7.350–7.450)
pH, Arterial: 7.406 (ref 7.350–7.450)
pH, Arterial: 7.34 — ABNORMAL LOW (ref 7.350–7.450)
pH, Arterial: 7.357 (ref 7.350–7.450)
pO2, Arterial: 106 mmHg (ref 83.0–108.0)
pO2, Arterial: 206 mmHg — ABNORMAL HIGH (ref 83.0–108.0)
pO2, Arterial: 184 mmHg — ABNORMAL HIGH (ref 83.0–108.0)
pO2, Arterial: 134 mmHg — ABNORMAL HIGH (ref 83.0–108.0)
pO2, Arterial: 141 mmHg — ABNORMAL HIGH (ref 83.0–108.0)

## 2020-05-28 LAB — GLUCOSE, CAPILLARY
Glucose-Capillary: 124 mg/dL — ABNORMAL HIGH (ref 70–99)
Glucose-Capillary: 126 mg/dL — ABNORMAL HIGH (ref 70–99)
Glucose-Capillary: 126 mg/dL — ABNORMAL HIGH (ref 70–99)
Glucose-Capillary: 127 mg/dL — ABNORMAL HIGH (ref 70–99)
Glucose-Capillary: 132 mg/dL — ABNORMAL HIGH (ref 70–99)
Glucose-Capillary: 132 mg/dL — ABNORMAL HIGH (ref 70–99)
Glucose-Capillary: 138 mg/dL — ABNORMAL HIGH (ref 70–99)
Glucose-Capillary: 138 mg/dL — ABNORMAL HIGH (ref 70–99)
Glucose-Capillary: 145 mg/dL — ABNORMAL HIGH (ref 70–99)
Glucose-Capillary: 146 mg/dL — ABNORMAL HIGH (ref 70–99)
Glucose-Capillary: 150 mg/dL — ABNORMAL HIGH (ref 70–99)

## 2020-05-28 LAB — CBC
HCT: 21 % — ABNORMAL LOW (ref 39.0–52.0)
HCT: 21.8 % — ABNORMAL LOW (ref 39.0–52.0)
HCT: 21.3 % — ABNORMAL LOW (ref 39.0–52.0)
HCT: 24.3 % — ABNORMAL LOW (ref 39.0–52.0)
Hemoglobin: 7.2 g/dL — ABNORMAL LOW (ref 13.0–17.0)
Hemoglobin: 7.5 g/dL — ABNORMAL LOW (ref 13.0–17.0)
Hemoglobin: 7.3 g/dL — ABNORMAL LOW (ref 13.0–17.0)
Hemoglobin: 8.4 g/dL — ABNORMAL LOW (ref 13.0–17.0)
MCH: 31.2 pg (ref 26.0–34.0)
MCH: 31.6 pg (ref 26.0–34.0)
MCH: 31.3 pg (ref 26.0–34.0)
MCH: 31 pg (ref 26.0–34.0)
MCHC: 34.3 g/dL (ref 30.0–36.0)
MCHC: 34.4 g/dL (ref 30.0–36.0)
MCHC: 34.3 g/dL (ref 30.0–36.0)
MCHC: 34.6 g/dL (ref 30.0–36.0)
MCV: 90.9 fL (ref 80.0–100.0)
MCV: 92 fL (ref 80.0–100.0)
MCV: 91.4 fL (ref 80.0–100.0)
MCV: 89.7 fL (ref 80.0–100.0)
Platelets: 82 10*3/uL — ABNORMAL LOW (ref 150–400)
Platelets: 82 10*3/uL — ABNORMAL LOW (ref 150–400)
Platelets: 79 10*3/uL — ABNORMAL LOW (ref 150–400)
Platelets: 74 10*3/uL — ABNORMAL LOW (ref 150–400)
RBC: 2.31 MIL/uL — ABNORMAL LOW (ref 4.22–5.81)
RBC: 2.37 MIL/uL — ABNORMAL LOW (ref 4.22–5.81)
RBC: 2.33 MIL/uL — ABNORMAL LOW (ref 4.22–5.81)
RBC: 2.71 MIL/uL — ABNORMAL LOW (ref 4.22–5.81)
RDW: 14.1 % (ref 11.5–15.5)
RDW: 14.6 % (ref 11.5–15.5)
RDW: 14.7 % (ref 11.5–15.5)
RDW: 14.6 % (ref 11.5–15.5)
WBC: 8.2 10*3/uL (ref 4.0–10.5)
WBC: 9 10*3/uL (ref 4.0–10.5)
WBC: 8.9 10*3/uL (ref 4.0–10.5)
WBC: 11.2 10*3/uL — ABNORMAL HIGH (ref 4.0–10.5)
nRBC: 0 % (ref 0.0–0.2)
nRBC: 0 % (ref 0.0–0.2)
nRBC: 0 % (ref 0.0–0.2)
nRBC: 0 % (ref 0.0–0.2)

## 2020-05-28 LAB — BASIC METABOLIC PANEL WITH GFR
Anion gap: 8 (ref 5–15)
BUN: 11 mg/dL (ref 8–23)
CO2: 24 mmol/L (ref 22–32)
Calcium: 7.7 mg/dL — ABNORMAL LOW (ref 8.9–10.3)
Chloride: 105 mmol/L (ref 98–111)
Creatinine, Ser: 0.99 mg/dL (ref 0.61–1.24)
GFR calc Af Amer: >60 mL/min
GFR calc non Af Amer: >60 mL/min
Glucose, Bld: 134 mg/dL — ABNORMAL HIGH (ref 70–99)
Potassium: 3.6 mmol/L (ref 3.5–5.1)
Sodium: 137 mmol/L (ref 135–145)

## 2020-05-28 LAB — BPAM FFP
Blood Product Expiration Date: 202107172359
Blood Product Expiration Date: 202107172359
ISSUE DATE / TIME: 202107132040
ISSUE DATE / TIME: 202107132040
Unit Type and Rh: 600
Unit Type and Rh: 6200

## 2020-05-28 LAB — BASIC METABOLIC PANEL
Anion gap: 10 (ref 5–15)
Anion gap: 9 (ref 5–15)
BUN: 10 mg/dL (ref 8–23)
BUN: 11 mg/dL (ref 8–23)
CO2: 22 mmol/L (ref 22–32)
CO2: 21 mmol/L — ABNORMAL LOW (ref 22–32)
Calcium: 7.1 mg/dL — ABNORMAL LOW (ref 8.9–10.3)
Calcium: 7.4 mg/dL — ABNORMAL LOW (ref 8.9–10.3)
Chloride: 108 mmol/L (ref 98–111)
Chloride: 109 mmol/L (ref 98–111)
Creatinine, Ser: 0.98 mg/dL (ref 0.61–1.24)
Creatinine, Ser: 1.05 mg/dL (ref 0.61–1.24)
GFR calc Af Amer: >60 mL/min (ref >60)
GFR calc Af Amer: >60 mL/min (ref >60)
GFR calc non Af Amer: >60 mL/min (ref >60)
GFR calc non Af Amer: >60 mL/min (ref >60)
Glucose, Bld: 154 mg/dL — ABNORMAL HIGH (ref 70–99)
Glucose, Bld: 155 mg/dL — ABNORMAL HIGH (ref 70–99)
Potassium: 3.7 mmol/L (ref 3.5–5.1)
Potassium: 4 mmol/L (ref 3.5–5.1)
Sodium: 140 mmol/L (ref 135–145)
Sodium: 139 mmol/L (ref 135–145)

## 2020-05-28 LAB — PREPARE FRESH FROZEN PLASMA
Unit division: 0
Unit division: 0

## 2020-05-28 LAB — BPAM PLATELET PHERESIS
Blood Product Expiration Date: 202107162359
Blood Product Expiration Date: 202107162359
ISSUE DATE / TIME: 202107131744
ISSUE DATE / TIME: 202107132040
Unit Type and Rh: 5100
Unit Type and Rh: 6200

## 2020-05-28 LAB — PREPARE PLATELET PHERESIS
Unit division: 0
Unit division: 0

## 2020-05-28 LAB — BPAM CRYOPRECIPITATE
Blood Product Expiration Date: 202107132333
ISSUE DATE / TIME: 202107131750
Unit Type and Rh: 5100

## 2020-05-28 LAB — DIC (DISSEMINATED INTRAVASCULAR COAGULATION)PANEL
D-Dimer, Quant: 0.64 ug/mL-FEU — ABNORMAL HIGH (ref 0.00–0.50)
Fibrinogen: 217 mg/dL (ref 210–475)
INR: 1.5 — ABNORMAL HIGH (ref 0.8–1.2)
Platelets: 80 10*3/uL — ABNORMAL LOW (ref 150–400)
Prothrombin Time: 17.7 seconds — ABNORMAL HIGH (ref 11.4–15.2)
Smear Review: NONE SEEN
aPTT: 33 seconds (ref 24–36)

## 2020-05-28 LAB — MAGNESIUM
Magnesium: 2.1 mg/dL (ref 1.7–2.4)
Magnesium: 2 mg/dL (ref 1.7–2.4)
Magnesium: 2.1 mg/dL (ref 1.7–2.4)

## 2020-05-28 LAB — PREPARE CRYOPRECIPITATE: Unit division: 0

## 2020-05-28 LAB — PREPARE RBC (CROSSMATCH)

## 2020-05-28 LAB — PROTIME-INR
INR: 3.6 — ABNORMAL HIGH (ref 0.8–1.2)
INR: 1.4 — ABNORMAL HIGH (ref 0.8–1.2)
Prothrombin Time: 34.5 s — ABNORMAL HIGH (ref 11.4–15.2)
Prothrombin Time: 17 seconds — ABNORMAL HIGH (ref 11.4–15.2)

## 2020-05-28 LAB — ECHO INTRAOPERATIVE TEE

## 2020-05-28 MED ORDER — ISOSORBIDE MONONITRATE ER 30 MG PO TB24
15.0000 mg | ORAL_TABLET | Freq: Every day | ORAL | Status: DC
  Administered 2020-05-28 – 2020-06-02 (×6): 15 mg via ORAL
  Filled 2020-05-28 (×5): qty 1

## 2020-05-28 MED ORDER — INSULIN ASPART 100 UNIT/ML ~~LOC~~ SOLN
0.0000 [IU] | SUBCUTANEOUS | Status: DC
  Administered 2020-05-28 – 2020-05-29 (×5): 2 [IU] via SUBCUTANEOUS

## 2020-05-28 MED ORDER — POTASSIUM CHLORIDE CRYS ER 20 MEQ PO TBCR
20.0000 meq | EXTENDED_RELEASE_TABLET | Freq: Two times a day (BID) | ORAL | Status: DC
  Administered 2020-05-28 – 2020-05-29 (×3): 20 meq via ORAL
  Filled 2020-05-28 (×3): qty 1

## 2020-05-28 MED ORDER — FUROSEMIDE 10 MG/ML IJ SOLN
40.0000 mg | Freq: Once | INTRAMUSCULAR | Status: AC
  Administered 2020-05-28: 40 mg via INTRAVENOUS
  Filled 2020-05-28: qty 4

## 2020-05-28 MED ORDER — ORAL CARE MOUTH RINSE
15.0000 mL | Freq: Two times a day (BID) | OROMUCOSAL | Status: DC
  Administered 2020-05-28 – 2020-05-30 (×5): 15 mL via OROMUCOSAL

## 2020-05-28 MED ORDER — METOPROLOL TARTRATE 12.5 MG HALF TABLET
12.5000 mg | ORAL_TABLET | Freq: Two times a day (BID) | ORAL | Status: DC
  Administered 2020-05-28 – 2020-05-30 (×4): 12.5 mg via ORAL
  Filled 2020-05-28 (×5): qty 1

## 2020-05-28 MED ORDER — POTASSIUM CHLORIDE 10 MEQ/50ML IV SOLN
10.0000 meq | INTRAVENOUS | Status: AC
  Administered 2020-05-28 (×3): 10 meq via INTRAVENOUS

## 2020-05-28 MED ORDER — METOPROLOL TARTRATE 25 MG/10 ML ORAL SUSPENSION
12.5000 mg | Freq: Two times a day (BID) | ORAL | Status: DC
  Filled 2020-05-28 (×2): qty 5

## 2020-05-28 MED ORDER — POTASSIUM CHLORIDE 10 MEQ/50ML IV SOLN
10.0000 meq | INTRAVENOUS | Status: AC
  Administered 2020-05-28 (×3): 10 meq via INTRAVENOUS
  Filled 2020-05-28: qty 50

## 2020-05-28 NOTE — Progress Notes (Signed)
Patient ID: Dave Robles, male   DOB: [birth year], 74 y.o.   MRN: [id number] [facility] DAILY ICU PROGRESS NOTE                   [address]            [place] [address]          [phone]   1 Day Post-Op Procedure(s) (LRB): REDO CORONARY ARTERY BYPASS GRAFTING (CABG), ON PUMP, TIMES TWO, USING RIGHT INTERNAL MAMMARY ARTERY AND LEFT RADIAL ARTERY (OPEN) (N/A) RADIAL ARTERY HARVEST (Left) TRANSESOPHAGEAL ECHOCARDIOGRAM (TEE) (N/A) MEDIAN STERNOTOMY (N/A)  Total Length of Stay:  LOS: 1 day   Subjective: Patient awake alert neurologically intact.  He was extubated last night approximately 7 hours postop.  His only complaint this morning is some numbness in his right small finger. He remains on nitroglycerin infusion, primarily for his arterial grafts.  On dopamine milrinone and Levophed, Levophed is being weaned.  Cardiac index is 3.6 Objective: Vital signs in last 24 hours: Temp:  [95.9 F (35.5 C)-98.6 F (37 C)] 98.4 F (36.9 C) (07/14 0700) Pulse Rate:  [66-112] 91 (07/14 0700) Cardiac Rhythm: Normal sinus rhythm (07/14 0400) Resp:  [0-24] 13 (07/14 0700) BP: (86-131)/(45-72) 118/63 (07/14 0700) SpO2:  [92 %-100 %] 97 % (07/14 0700) Arterial Line BP: (70-160)/(26-61) 145/38 (07/14 0700) FiO2 (%):  [50 %] 50 % (07/14 0105) Weight:  [115.6 kg] 115.6 kg (07/14 0500)  Filed Weights   05/28/20 0500  Weight: 115.6 kg    Weight change:    Hemodynamic parameters for last 24 hours: PAP: (18-34)/(7-20) 20/8 CVP:  [2 mmHg-9 mmHg] 2 mmHg CO:  [5.3 L/min-8 L/min] 8 L/min CI:  [2.4 L/min/m2-3.6 L/min/m2] 3.6 L/min/m2  Intake/Output from previous day: 07/13 0701 - 07/14 0700 In: 13018.5 [P.O.:120; I.V.:5947.9; Blood:3739; IV Piggyback:3211.6] Out: 4235 [Urine:2665; Blood:500; Chest Tube:1070]  Intake/Output this shift: No intake/output data recorded.  Current Meds: Scheduled Meds: . acetaminophen  1,000 mg Oral Q6H   Or  . acetaminophen (TYLENOL) oral  liquid 160 mg/5 mL  1,000 mg Per Tube Q6H  . aspirin EC  325 mg Oral Daily   Or  . aspirin  324 mg Per Tube Daily  . atorvastatin  80 mg Oral Daily  . bisacodyl  10 mg Oral Daily   Or  . bisacodyl  10 mg Rectal Daily  . buPROPion  300 mg Oral Daily  . calcipotriene  1 application Topical BID  . chlorhexidine gluconate (MEDLINE KIT)  15 mL Mouth Rinse BID  . chlorhexidine gluconate (MEDLINE KIT)  15 mL Mouth Rinse BID  . Chlorhexidine Gluconate Cloth  6 each Topical Daily  . clobetasol cream  1 application Topical Daily  . docusate sodium  200 mg Oral Daily  . isosorbide mononitrate  15 mg Oral Daily  . mouth rinse  15 mL Mouth Rinse BID  . metoprolol tartrate  12.5 mg Oral BID   Or  . metoprolol tartrate  12.5 mg Per Tube BID  . [START ON 05/29/2020] pantoprazole  40 mg Oral Daily  . sodium chloride flush  10-40 mL Intracatheter Q12H  . sodium chloride flush  3 mL Intravenous Q12H   Continuous Infusions: . sodium chloride 20 mL/hr at 05/28/20 0700  . sodium chloride    . sodium chloride 20 mL/hr at 05/27/20 1830  . albumin human 12.5 g (05/27/20 1859)  . cefUROXime (ZINACEF)  IV Stopped (05/27/20 2125)  . dexmedetomidine (PRECEDEX) IV  infusion Stopped (05/28/20 0108)  . DOPamine 3 mcg/kg/min (05/28/20 0700)  . insulin 0.8 mL/hr at 05/28/20 0700  . lactated ringers    . lactated ringers    . lactated ringers    . milrinone 0.25 mcg/kg/min (05/28/20 0700)  . nitroGLYCERIN 7 mcg/min (05/28/20 0700)  . norepinephrine (LEVOPHED) Adult infusion 6 mcg/min (05/28/20 0700)  . phenylephrine (NEO-SYNEPHRINE) Adult infusion Stopped (05/27/20 2207)  . vancomycin     PRN Meds:.sodium chloride, albumin human, dextrose, lactated ringers, metoprolol tartrate, midazolam, morphine injection, ondansetron (ZOFRAN) IV, oxyCODONE, sodium chloride flush, sodium chloride flush, traMADol  General appearance: alert, cooperative and no distress Neurologic: intact Heart: regular rate and rhythm, S1,  S2 normal, no murmur, click, rub or gallop Lungs: diminished breath sounds bibasilar Abdomen: soft, non-tender; bowel sounds normal; no masses,  no organomegaly Extremities: extremities normal, atraumatic, no cyanosis or edema Wound: Sternum is stable dressing intact  Lab Results: CBC: Recent Labs    05/28/20 0531 05/28/20 0531 05/28/20 0629 05/28/20 0634  WBC 9.0  --   --  8.9  HGB 7.5*  --   --  7.3*  HCT 21.8*  --   --  21.3*  PLT 82*   < > 80* 79*   < > = values in this interval not displayed.   BMET:  Recent Labs    05/28/20 0118 05/28/20 0225 05/28/20 0339 05/28/20 0531  NA 142  140   < > 144 139  K 3.8  3.7   < > 3.9 4.0  CL 108  --   --  109  CO2 22  --   --  21*  GLUCOSE 154*  --   --  155*  BUN 10  --   --  11  CREATININE 0.98  --   --  1.05  CALCIUM 7.1*  --   --  7.4*   < > = values in this interval not displayed.    CMET: Lab Results  Component Value Date   WBC 8.9 05/28/2020   HGB 7.3 (L) 05/28/2020   HCT 21.3 (L) 05/28/2020   PLT 79 (L) 05/28/2020   GLUCOSE 155 (H) 05/28/2020   CHOL 119 01/10/2020   TRIG 90.0 01/10/2020   HDL 28.90 (L) 01/10/2020   LDLDIRECT 66.7 11/30/2011   LDLCALC 72 01/10/2020   ALT 38 05/23/2020   AST 26 05/23/2020   NA 139 05/28/2020   K 4.0 05/28/2020   CL 109 05/28/2020   CREATININE 1.05 05/28/2020   BUN 11 05/28/2020   CO2 21 (L) 05/28/2020   TSH 2.57 01/10/2020   PSA 1.30 01/10/2020   INR 1.4 (H) 05/28/2020   HGBA1C 5.6 05/23/2020      PT/INR:  Recent Labs    05/28/20 0634  LABPROT 17.0*  INR 1.4*   Radiology: [facility] Chest Port 1 View  Result Date: 05/27/2020 CLINICAL DATA:  Status post coronary artery bypass grafting. EXAM: PORTABLE CHEST 1 VIEW COMPARISON:  05/23/2020 FINDINGS: Endotracheal tube is seen 8.3 cm above the carina. Right internal jugular Swan-Ganz catheter tip is seen within the expected main pulmonary artery. Nasogastric tube extends into the upper abdomen beyond the margin of the  examination. Bibasilar chest tubes are in place. Lung volumes are small. Trace asymmetric interstitial pulmonary infiltrate within the left lung likely represents asymmetric pulmonary edema. No pneumothorax or pleural effusion. Mild pneumomediastinum is likely postsurgical in nature. Cardiac size is within normal limits. Coronary artery bypass grafting has been performed. IMPRESSION: Support lines and  tubes as described above. Advancement of the endotracheal tube could be considered. Pulmonary hypoinflation. Superimposed mild asymmetric left interstitial pulmonary edema. Bibasilar chest tubes in place. No pneumothorax. Trace pneumomediastinum. Electronically Signed   By: Fidela Salisbury MD   On: 05/27/2020 19:23     Assessment/Plan: S/P Procedure(s) (LRB): REDO CORONARY ARTERY BYPASS GRAFTING (CABG), ON PUMP, TIMES TWO, USING RIGHT INTERNAL MAMMARY ARTERY AND LEFT RADIAL ARTERY (OPEN) (N/A) RADIAL ARTERY HARVEST (Left) TRANSESOPHAGEAL ECHOCARDIOGRAM (TEE) (N/A) MEDIAN STERNOTOMY (N/A) Mobilize Diuresis Patient anemic will transfuse 1 unit of packed cells and diurese Wean Levophed off, then dopamine and milrinone, continue IV nitroglycerin for now and convert to Imdur when other drips off With low platelets we will avoid heparin exposure for now    Grace Isaac 05/28/2020 7:33 AM

## 2020-05-28 NOTE — Addendum Note (Signed)
Addendum  created 05/28/20 1243 by Sammie Bench, CRNA   Order list changed

## 2020-05-28 NOTE — Discharge Summary (Signed)
Physician Discharge Summary  Patient ID: EDUIN FRIEDEL MRN: [id number] DOB/AGE: 1946-05-15 74 y.o.  Admit date: 05/27/2020 Discharge date: 06/02/2020  Admission Diagnoses:recurrent severe CAD  Discharge Diagnoses:  Active Problems:   S/P CABG x 2  Patient Active Problem List   Diagnosis Date Noted   S/P CABG x 2 05/27/2020   DOE (dyspnea on exertion)    OA (osteoarthritis) of hip 04/19/2018   AAA (abdominal aortic aneurysm) without rupture ([place]) 01/19/2017   Pain in joint of left knee 01/19/2017   Episodic mood disorder ([place]) 08/01/2014   Personal history of colonic polyps 07/08/2014   Onychomycosis 05/14/2014   Hyperlipidemia 07/10/2007   ERECTILE DYSFUNCTION 07/10/2007   Essential hypertension 07/10/2007   Coronary artery disease involving native coronary artery of native heart without angina pectoris 07/10/2007    History of Present Illness:   at time of surgeon evaluation  [name] 74 y.o. male was seen in the office   for second opinion regarding redo coronary artery bypass grafting.  The patient recently underwent outpatient cardiac catheterization Dr. Tamala Julian.  This was precipitated by increasing dyspnea on exertion.  This is usually brought on with riding his bicycle especially going uphill.. He has a long history of known coronary artery disease.  He developed unstable angina in August 1991 while traveling, returned home and ultimately underwent coronary artery bypass grafting x3 by Dr. Redmond Pulling with the left internal mammary to the left anterior descending coronary artery reverse saphenous vein graft to the diagonal) reverse saphenous vein graft to the distal right coronary artery with vein harvest from the left lower leg.  Patient went 25 years with no cardiac symptoms.  5 years ago while in [place] he was admitted to the hospital diagnosed with myocardial infarction based on enzymes, repeat catheterization and a stent placed in the proximal right coronary  artery graft.   Is been maintained on statin, Plavix, and 81 mg of aspirin.  Last year his aspirin was discontinued because of increased bruising.  Because of recurrent anginal equivalent symptoms primarily dyspnea on exertion the patient underwent repeat cardiac catheterization by Dr. Tamala Julian demonstrating preserved LV function, in-stent stenosis of the stent in the proximal right coronary artery, progression of disease in the proximal circumflex and diagonal.  The mammary artery is large and patent with collateral filling to the right.     Patient has no history of diabetes, he was a smoker until his bypass surgery in 1991 and has not smoked since.  Medical records reports the old op note was destroyed   The patient and his studies were evaluated by Dr. Servando Snare who felt the patient was a candidate for redo CABG.  He was admitted this hospitalization for the procedure.  Discharged Condition: stable  Hospital Course: Patient was admitted electively and on 05/27/2020 he was taken to the operating room where he underwent redo coronary artery bypass grafting x2.  A left radial artery was placed to the PDA, and a free right internal mammary artery was placed to the obtuse marginal.  The patient tolerated the procedure well was taken to the surgical intensive care unit in stable condition.  Postoperative hospital course:  The patient was weaned from the ventilator using standard post op surgical protocol without difficulty.  He initially did require some inotropic support including milrinone, dopamine and Levophed.  Levophed was weaned quickly on the evening of surgery.  He was noted on postoperative day #1 to have a acute blood loss anemia requiring transfusion.  Additionally the patient did have an early postoperative thrombocytopenia for which we tried to avoid early exposure to heparin.  By postoperative day #2 the milrinone and dopamine were both weaned off.  He began walking around in the unit and  was felt to be stable for transfer to 4 E.  He continued to progress and respond to diuresis. He was weaned from supplemental O2 without any trouble. He had return of appropriate bowel and bladder function. He did not have any significant arrhythmias. An early decline in his platelet count was trending back to normal by the time of discharge. HIT screen was negative.   Consults: None  Significant Diagnostic Studies:    EXAM: CHEST - 2 VIEW  COMPARISON:  05/30/2020  FINDINGS: Hyperinflation. Prior median sternotomy. Mild cardiomegaly. Atherosclerosis in the transverse aorta. Small bilateral pleural effusions, similar given differences in technique. Right IJ Cordis sheath removed in the interval. Improved left and slight increase in right base atelectasis.  IMPRESSION: Persistent small bilateral pleural effusions with improved left and slight increase in right base atelectasis.  Hyperinflation, suggesting COPD.   Electronically Signed   By: Abigail Miyamoto M.D.   On: 05/31/2020 10:56   Treatments: OPERATIVE REPORT  DATE OF PROCEDURE:  05/27/2020  NAME OF PROCEDURE:  Redo coronary artery bypass grafting with free right internal mammary artery to the obtuse marginal and left radial graft to the posterior descending coronary artery with open left radial harvesting  SURGEON:  Lanelle Bal, MD  FIRST ASSISTANT:  Jadene Pierini, PA   Discharge Exam: Blood pressure 138/66, pulse 80, temperature 98.5 F (36.9 C), temperature source Oral, resp. rate 20, height 5\' 10"  (1.778 m), weight 110.1 kg, SpO2 98 %.  General appearance: alert, cooperative and no distress Neurologic: intact Heart: SR, no significant arrhythmias. Lungs: breath sounds clear. Abdomen: Soft, NT,  Extremities: Mild LE edema.  Wound: dry and intact. Leaving the CT sutures in place for now.,  Disposition:   Discharged to home.          LEFT HEART CATH AND CORS/GRAFTS ANGIOGRAPHY   Conclusion   Total occlusion of the mid LAD, total occlusion of the mid RCA, and high-grade calcified obstruction in the ostial to proximal circumflex with diffuse 50% left main stenosis.  Patent saphenous vein graft to the diagonal with 70 to 80% stenosis distal to the graft insertion site.  The diagonal fills retrogradely into the first septal perforator and the proximal LAD segment.  Diffuse 95% in-stent restenosis in the ostial to proximal saphenous vein graft to the distal RCA.  Normal LV function.  LVEDP is normal.  EF is estimated to be 60%.  RECOMMENDATIONS:   Critical decision point for this patient who had coronary bypass surgery in 1991, and now has diffuse in-stent restenosis in the ostium and proximal segment of the saphenous vein graft to the right coronary.  The native circumflex is heavily calcified and has critical stenosis.  The diagonal beyond the saphenous vein graft insertion site has significant disease.  Long-term patency of the right coronary graft will be markedly reduced PCI and restenting.  The technical standpoint it appears that the stent extends outside the ostium of the bypass graft.  The right coronary territory is large.  The patient is relatively healthy.  His best long-term approach may be repeat coronary bypass surgery.  Before starting down the path of stent implantation, will  refer for heart team evaluation to consider surgical revascularization.  If deemed not a surgical candidate or  if the patient prefers PCI, this could be attempted but will carry higher technical risk and likelihood of repeat revascularization. Recommendations  Antiplatelet/Anticoag Recommend Aspirin 81mg  daily for moderate CAD.  Surgeon Notes    05/27/2020 6:54 PM Brief Op Note addendum by Grace Isaac, MD    05/27/2020 6:33 PM Operative Note - Scan signed by Default, Provider, MD    05/13/2020 8:41 AM CV Procedure signed by Belva Crome, MD  Indications  DOE (dyspnea  on exertion) [R06.00 (ICD-10-CM)]  CAD of autologous bypass graft [I25.810 (ICD-10-CM)]  Procedural Details  Technical Details The right radial area was sterilely prepped and draped. Intravenous sedation with Versed and fentanyl was administered. 1% Xylocaine was infiltrated to achieve local analgesia. Using real-time vascular ultrasound, a double wall stick with an angiocath was utilized to obtain intra-arterial access. A VUS image was saved for the record.The modified Seldinger technique was used to place a 20F " Slender" sheath in the right radial artery. Weight based heparin was administered. Coronary angiography was done using 5 F catheters.  Native coronary angiography, vein bypass graft angiography and left ventriculography was performed using a B2 5 [place] multipurpose catheter.  Tortuosity in the femoral and iliac system made catheter torque difficult.  An IMA catheter was used for LIMA angiography.  This catheter was also used to selectively engage the native right coronary.  Hemostasis was achieved using a pneumatic band.  During this procedure the patient is administered a total of Versed 1 mg and Fentanyl 25 mg to achieve and maintain moderate conscious sedation.  The patient's heart rate, blood pressure, and oxygen saturation are monitored continuously during the procedure. The period of conscious sedation is 50 minutes, of which I was present face-to-face 100% of this time. Estimated blood loss <50 mL.   During this procedure medications were administered to achieve and maintain moderate conscious sedation while the patient's heart rate, blood pressure, and oxygen saturation were continuously monitored and I was present face-to-face 100% of this time.  Medications (Filter: Administrations occurring from 0727 to 0832 on 05/13/20) (important) Continuous medications are totaled by the amount administered until 05/13/20 0832.  fentaNYL (SUBLIMAZE) injection (mcg) Total dose:  50  mcg  Date/Time  Rate/Dose/Volume Action  05/13/20 0738  25 mcg Given  0747  25 mcg Given    midazolam (VERSED) injection (mg) Total dose:  2 mg  Date/Time  Rate/Dose/Volume Action  05/13/20 0738  1 mg Given  0747  1 mg Given    Heparin (Porcine) in NaCl 1000-0.9 UT/500ML-% SOLN (mL) Total volume:  1,000 mL  Date/Time  Rate/Dose/Volume Action  05/13/20 0738  500 mL Given  0739  500 mL Given    lidocaine (PF) (XYLOCAINE) 1 % injection (mL) Total volume:  30 mL  Date/Time  Rate/Dose/Volume Action  05/13/20 0745  30 mL Given    iohexol (OMNIPAQUE) 350 MG/ML injection (mL) Total volume:  130 mL  Date/Time  Rate/Dose/Volume Action  05/13/20 0830  130 mL Given    Sedation Time  Sedation Time Physician-1: 44 minutes 32 seconds  Contrast  Medication Name Total Dose  iohexol (OMNIPAQUE) 350 MG/ML injection 130 mL    Radiation/Fluoro  Fluoro time: 10.6 (min) DAP: 38539 (mGycm2) Cumulative Air Kerma: 597 (mGy)  Coronary Findings  Diagnostic Dominance: Right Left Main  Ost LM to Dist LM lesion is 50% stenosed.  Left Anterior Descending  Ost LAD to Prox LAD lesion is 80% stenosed.  Prox LAD to Mid LAD lesion is 100%  stenosed.  First Diagonal Branch  1st Diag lesion is 75% stenosed.  Left Circumflex  Ost Cx to Prox Cx lesion is 95% stenosed.  First Obtuse Marginal Branch  Vessel is small in size.  Second Obtuse Marginal Branch  Vessel is large in size.  Right Coronary Artery  Prox RCA to Mid RCA lesion is 100% stenosed.  LIMA Graft To Dist LAD  Graft To 1st Diag  Graft To RPDA  Origin to Prox Graft lesion is 95% stenosed. The lesion was previously treated.  Intervention  No interventions have been documented. Wall Motion  Resting               Left Heart  Left Ventricle The left ventricular size is normal. The left ventricular systolic function is normal. LV end diastolic pressure is normal. The left ventricular ejection fraction is 55-65% by visual  estimate. No regional wall motion abnormalities.  Coronary Diagrams  Diagnostic Dominance: Right      Patient name: ANDREZ LIEURANCE  MRN: [id number]  Age: 74 y.o.  Sex: male  MyChart Results Release  MyChart Status: Active Results Release  Vitals  BP Height Weight BSA (Calculated - sq m)  128/65 5\' 10"  (1.778 m) 115.6 kg 2.26 sq meters  Order-Level Documents:  Scan on 05/24/2020 4:00 PM by Default, Provider, MD     Study Result    ECHOCARDIOGRAM REPORT       Patient Name:  [name] Date of Exam: 05/23/2020  Medical Rec #: [id number]    Height:    70.0 in  Accession #:  [id number]    Weight:    231.0 lb  Date of Birth: 01/14/1946    BSA:     2.219 m  Patient Age:  29 years     BP:      149/84 mmHg  Patient Gender: M        HR:      52 bpm.  Exam Location: Inpatient   Procedure: 2D Echo, Color Doppler and Cardiac Doppler   Indications:  CAD         Pre-op evaluation    History:    Patient has prior history of Echocardiogram examinations,  most         recent 04/07/2015. CAD; Risk Factors:Hypertension and         Dyslipidemia. DOE.    Sonographer:  Clayton Lefort RDCS (AE)  Referring Phys: [id number] Lilia Argue GERHARDT   IMPRESSIONS    1. Left ventricular ejection fraction, by estimation, is 60 to 65%. The  left ventricle has normal function. The left ventricle has no regional  wall motion abnormalities. There is moderate left ventricular hypertrophy.  Left ventricular diastolic  parameters were normal.  2. Right ventricular systolic function is mildly reduced. The right  ventricular size is normal. Tricuspid regurgitation signal is inadequate  for assessing PA pressure.  3. Left atrial size was moderately dilated.  4. The mitral valve is normal in structure. Trivial mitral valve  regurgitation.  5. The aortic valve is tricuspid. Aortic valve regurgitation is  not  visualized. Mild to moderate aortic valve sclerosis/calcification is  present, without any evidence of aortic stenosis.  6. The inferior vena cava is normal in size with greater than 50%  respiratory variability, suggesting right atrial pressure of 3 mmHg.   FINDINGS  Left Ventricle: Left ventricular ejection fraction, by estimation, is 60  to 65%. The left ventricle has normal function. The left ventricle has no  regional  wall motion abnormalities. The left ventricular internal cavity  size was normal in size. There is  moderate left ventricular hypertrophy. Left ventricular diastolic  parameters were normal.   Right Ventricle: The right ventricular size is normal. Right vetricular  wall thickness was not assessed. Right ventricular systolic function is  mildly reduced. Tricuspid regurgitation signal is inadequate for assessing  PA pressure.   Left Atrium: Left atrial size was moderately dilated.   Right Atrium: Right atrial size was normal in size.   Pericardium: Trivial pericardial effusion is present. Presence of  pericardial fat pad.   Mitral Valve: The mitral valve is normal in structure. Trivial mitral  valve regurgitation. MV peak gradient, 2.6 mmHg. The mean mitral valve  gradient is 1.0 mmHg.   Tricuspid Valve: The tricuspid valve is normal in structure. Tricuspid  valve regurgitation is trivial.   Aortic Valve: The aortic valve is tricuspid. Aortic valve regurgitation is  not visualized. Mild to moderate aortic valve sclerosis/calcification is  present, without any evidence of aortic stenosis. Aortic valve mean  gradient measures 4.0 mmHg. Aortic  valve peak gradient measures 8.9 mmHg. Aortic valve area, by VTI measures  2.03 cm.   Pulmonic Valve: The pulmonic valve was not well visualized. Pulmonic valve  regurgitation is not visualized.   Aorta: The aortic root is normal in size and structure.   Venous: The inferior vena cava is normal in size with  greater than 50%  respiratory variability, suggesting right atrial pressure of 3 mmHg.   IAS/Shunts: The interatrial septum was not well visualized.     LEFT VENTRICLE  PLAX 2D  LVIDd:     4.60 cm Diastology  LVIDs:     3.10 cm LV e' lateral:  11.00 cm/s  LV PW:     1.60 cm LV E/e' lateral: 6.6  LV IVS:    1.50 cm LV e' medial:  9.68 cm/s  LVOT diam:   2.00 cm LV E/e' medial: 7.5  LV SV:     66  LV SV Index:  30  LVOT Area:   3.14 cm     RIGHT VENTRICLE      IVC  RV S prime:   7.73 cm/s IVC diam: 1.60 cm  TAPSE (M-mode): 1.4 cm   LEFT ATRIUM       Index    RIGHT ATRIUM      Index  LA diam:    3.70 cm 1.67 cm/m RA Area:   18.70 cm  LA Vol (A2C):  89.4 ml 40.29 ml/m RA Volume:  58.20 ml 26.23 ml/m  LA Vol (A4C):  93.2 ml 42.00 ml/m  LA Biplane Vol: 92.1 ml 41.50 ml/m  AORTIC VALVE  AV Area (Vmax):  1.80 cm  AV Area (Vmean):  1.80 cm  AV Area (VTI):   2.03 cm  AV Vmax:      149.00 cm/s  AV Vmean:     94.900 cm/s  AV VTI:      0.327 m  AV Peak Grad:   8.9 mmHg  AV Mean Grad:   4.0 mmHg  LVOT Vmax:     85.50 cm/s  LVOT Vmean:    54.500 cm/s  LVOT VTI:     0.211 m  LVOT/AV VTI ratio: 0.65    AORTA  Ao Root diam: 3.40 cm   MITRAL VALVE  MV Area (PHT): 2.18 cm  SHUNTS  MV Peak grad: 2.6 mmHg  Systemic VTI: 0.21 m  MV Mean grad: 1.0 mmHg  Systemic Diam: 2.00 cm  MV Vmax:    0.81 m/s  MV Vmean:   41.7 cm/s  MV Decel Time: 348 msec  MV E velocity: 72.80 cm/s  MV A velocity: 52.70 cm/s  MV E/A ratio: 1.38   Oswaldo Milian MD  Electronically signed by Oswaldo Milian MD  Signature Date/Time: 05/24/2020/3:59:37 PM      Final                             Discharge Instructions    Amb Referral to Cardiac Rehabilitation   Complete by: As directed    Diagnosis: CABG   CABG X ___: 2   After  initial evaluation and assessments completed: Virtual Based Care may be provided alone or in conjunction with Phase 2 Cardiac Rehab based on patient barriers.: Yes     Allergies as of 06/02/2020   No Known Allergies     Medication List    STOP taking these medications   lisinopril-hydrochlorothiazide 20-12.5 MG tablet Commonly known as: ZESTORETIC   naproxen sodium 220 MG tablet Commonly known as: ALEVE     TAKE these medications   acetaminophen 325 MG tablet Commonly known as: TYLENOL Take 650 mg by mouth every 6 (six) hours as needed for moderate pain or headache.   aspirin 81 MG EC tablet Take 1 tablet (81 mg total) by mouth daily. Swallow whole.   atorvastatin 80 MG tablet Commonly known as: LIPITOR TAKE 1 TABLET DAILY What changed:   how much to take  how to take this  when to take this  additional instructions   buPROPion 300 MG 24 hr tablet Commonly known as: WELLBUTRIN XL Take 1 tablet (300 mg total) by mouth daily.   calcipotriene 0.005 % cream Commonly known as: DOVONOX Apply 1 application topically 2 (two) times daily.   clobetasol cream 0.05 % Commonly known as: TEMOVATE Apply 1 application topically daily.   clopidogrel 75 MG tablet Commonly known as: PLAVIX Take 1 tablet (75 mg total) by mouth daily.   furosemide 40 MG tablet Commonly known as: LASIX Take 1 tablet (40 mg total) by mouth daily for 5 days.   isosorbide mononitrate 30 MG 24 hr tablet Commonly known as: IMDUR Take 1 tablet (30 mg total) by mouth daily.   lisinopril 10 MG tablet Commonly known as: ZESTRIL Take 1 tablet (10 mg total) by mouth daily.   Melatonin 10 MG Tabs Take 20 mg by mouth at bedtime.   metoprolol tartrate 25 MG tablet Commonly known as: LOPRESSOR Take 1 tablet (25 mg total) by mouth 2 (two) times daily.   multivitamin capsule Take 1 capsule by mouth daily.   nitroGLYCERIN 0.4 MG SL tablet Commonly known as: NITROSTAT Place 1 tablet (0.4 mg total)  under the tongue every 5 (five) minutes as needed.   potassium chloride SA 20 MEQ tablet Commonly known as: KLOR-CON Take 1 tablet (20 mEq total) by mouth daily for 5 days.   sildenafil 100 MG tablet Commonly known as: VIAGRA Take 100 mg by mouth daily as needed for erectile dysfunction.   traMADol 50 MG tablet Commonly known as: ULTRAM Take 1 tablet (50 mg total) by mouth every 6 (six) hours as needed for up to 5 days for moderate pain.   traZODone 50 MG tablet Commonly known as: DESYREL Take 1 tablet (50 mg total) by mouth at bedtime as needed for sleep.       Follow-up  Information    Grace Isaac, MD. Go on 06/26/2020.   Specialty: Cardiothoracic Surgery Why: Your follow up appointment with Dr. Servando Snare is on Thursday, 06/16/20 at 4pm.  Also, obtain a chest x-ray at [facility] 1/2-hour prior to this appointment.  It is located in the same office complex on the first floor. Contact information: [address] [place] [address] [phone]        [name], NP. Go on 06/10/2020.   Specialties: Nurse Practitioner, Interventional Cardiology, Cardiology, Radiology Why: Your appointment is at 3:15pm. Contact information: [place]. [address] [place] [address] [phone]              The patient has been discharged on:   1.Beta Blocker:  Yes [ x  ]                              No   [   ]                              If No, reason:  2.Ace Inhibitor/ARB: Yes [ x  ]                                     No  [    ]                                     If No, reason:  3.Statin:   Yes [  x ]                  No  [   ]                  If No, reason:  4.Ecasa:  Yes  [x   ]                  No   [   ]                  If No, reason:     Signed: Cloris Flippo G. Alylah Blakney 06/02/2020, 8:46 AM

## 2020-05-28 NOTE — Progress Notes (Signed)
      [address]       [place] [address]             [phone]      Sleeping in chair  BP 123/64   Pulse (!) 111   Temp 98.2 F (36.8 C) (Oral)   Resp 17   Ht 5\' 10"  (1.778 m)   Wt 115.6 kg   SpO2 96%   BMI 36.57 kg/m  ST with PVCs, PACs- given metoprolol PO   Intake/Output Summary (Last 24 hours) at 05/28/2020 1941 Last data filed at 05/28/2020 1900 Gross per 24 hour  Intake 4590.8 ml  Output 4895 ml  Net -304.2 ml   K= 3.6 will supplement IV and PO  Gizzelle Lacomb C. Roxan Hockey, MD [facility] [phone]

## 2020-05-28 NOTE — Discharge Instructions (Signed)
[facility] office [phone]  Coronary Artery Bypass Grafting, Care After This sheet gives you information about how to care for yourself after your procedure. Your doctor may also give you more specific instructions. If you have problems or questions, call your doctor. What can I expect after the procedure? After the procedure, it is common to:  Feel sick to your stomach (nauseous).  Not want to eat as much as normal (lack of appetite).  Have trouble pooping (constipation).  Have weakness and tiredness (fatigue).  Feel sad (depressed) or grouchy (irritable).  Have pain or discomfort around the cuts from surgery (incisions). Follow these instructions at home: Medicines  Take over-the-counter and prescription medicines only as told by your doctor. Do not stop taking medicines or start any new medicines unless your doctor says it is okay.  If you were prescribed an antibiotic medicine, take it as told by your doctor. Do not stop taking the antibiotic even if you start to feel better. Incision care   Follow instructions from your doctor about how to take care of your cuts from surgery. Make sure you: ? Wash your hands with soap and water before and after you change your bandage (dressing). If you cannot use soap and water, use hand sanitizer. ? Change your bandage as told by your doctor. ? Leave stitches (sutures), skin glue, or skin tape (adhesive) strips in place. They may need to stay in place for 2 weeks or longer. If tape strips get loose and curl up, you may trim the loose edges. Do not remove tape strips completely unless your doctor says it is okay.  Make sure the surgery cuts are clean, dry, and protected.  Check your cut areas every day for signs of infection. Check for: ? More redness, swelling, or pain. ? More fluid or blood. ? Warmth. ? Pus or a bad smell.  If cuts were made in your legs: ? Avoid crossing your legs. ? Avoid sitting for long periods of time. Change  positions every 30 minutes. ? Raise (elevate) your legs when you are sitting. Bathing  Do not take baths, swim, or use a hot tub until your doctor says it is okay.  You may shower. Pat the surgery cuts dry. Do not rub the cuts to dry.  Eating and drinking   Eat foods that are high in fiber, such as beans, nuts, whole grains, and raw fruits and vegetables. Any meats you eat should be lean cut. Avoid canned, processed, and fried foods. This can help prevent trouble pooping. This is also a part of a heart-healthy diet.  Drink enough fluid to keep your pee (urine) pale yellow.  Do not drink alcohol until you are fully recovered. Ask your doctor when it is safe to drink alcohol. Activity  Rest and limit your activity as told by your doctor. You may be told to: ? Stop any activity right away if you have chest pain, shortness of breath, irregular heartbeats, or dizziness. Get help right away if you have any of these symptoms. ? Move around often for short periods or take short walks as told by your doctor. Slowly increase your activities. ? Avoid lifting, pushing, or pulling anything that is heavier than 10 lb (4.5 kg) for at least 6 weeks or as told by your doctor.  Do physical therapy or a cardiac rehab (cardiac rehabilitation) program as told by your doctor. ? Physical therapy involves doing exercises to maintain movement and build strength and endurance. ? A cardiac  rehab program includes:  Exercise training.  Education.  Counseling.  Do not drive until your doctor says it is okay.  Ask your doctor when you can go back to work.  Ask your doctor when you can be sexually active. General instructions  Do not drive or use heavy machinery while taking prescription pain medicine.  Do not use any products that contain nicotine or tobacco. These include cigarettes, e-cigarettes, and chewing tobacco. If you need help quitting, ask your doctor.  Take 2-3 deep breaths every few hours  during the day while you get better. This helps expand your lungs and prevent problems.  If you were given a device called an incentive spirometer, use it several times a day to practice deep breathing. Support your chest with a pillow or your arms when you take deep breaths or cough.  Wear compression stockings as told by your doctor.  Weigh yourself every day. This helps to see if your body is holding (retaining) fluid that may make your heart and lungs work harder.  Keep all follow-up visits as told by your doctor. This is important. Contact a doctor if:  You have more redness, swelling, or pain around any cut.  You have more fluid or blood coming from any cut.  Any cut feels warm to the touch.  You have pus or a bad smell coming from any cut.  You have a fever.  You have swelling in your ankles or legs.  You have pain in your legs.  You gain 2 lb (0.9 kg) or more a day.  You feel sick to your stomach or you throw up (vomit).  You have watery poop (diarrhea). Get help right away if:  You have chest pain that goes to your jaw or arms.  You are short of breath.  You have a fast or irregular heartbeat.  You notice a "clicking" in your breastbone (sternum) when you move.  You have any signs of a stroke. "BE FAST" is an easy way to remember the main warning signs: ? B - Balance. Signs are dizziness, sudden trouble walking, or loss of balance. ? E - Eyes. Signs are trouble seeing or a change in how you see. ? F - Face. Signs are sudden weakness or loss of feeling of the face, or the face or eyelid drooping on one side. ? A - Arms. Signs are weakness or loss of feeling in an arm. This happens suddenly and usually on one side of the body. ? S - Speech. Signs are sudden trouble speaking, slurred speech, or trouble understanding what people say. ? T - Time. Time to call emergency services. Write down what time symptoms started.  You have other signs of a stroke, such as: ? A  sudden, very bad headache with no known cause. ? Feeling sick to your stomach. ? Throwing up. ? Jerky movements you cannot control (seizure). These symptoms may be an emergency. Do not wait to see if the symptoms will go away. Get medical help right away. Call your local emergency services (911 in the [place] Do not drive yourself to the hospital. Summary  After the procedure, it is common to have pain or discomfort in the cuts from surgery (incisions).  Do not take baths, swim, or use a hot tub until your doctor says it is okay.  Slowly increase your activities. You may need physical therapy or cardiac rehab.  Weigh yourself every day. This helps to see if your body is holding fluid.  This information is not intended to replace advice given to you by your health care provider. Make sure you discuss any questions you have with your health care provider. Document Revised: 07/11/2018 Document Reviewed: 07/11/2018 Elsevier Patient Education  2020 [employer]  

## 2020-05-28 NOTE — Procedures (Signed)
Extubation Procedure Note  Patient Details:   Name: Dave Robles DOB: Mar 26, 1946 MRN: [id number]   Airway Documentation:    Vent end date: 05/28/20 Vent end time: 0225   RT extubated patient to 4L [place]. Patient able to -30 on NIF and VC of 1L. Patient tolerated well. No stridor noted. Patient able to speak. RN at beside with patient doing IS.  Evaluation  O2 sats: stable throughout Complications: No apparent complications Patient did tolerate procedure well. Bilateral Breath Sounds: Rhonchi, Diminished   Yes able to [place] 05/28/2020, 225

## 2020-05-29 ENCOUNTER — Inpatient Hospital Stay (HOSPITAL_COMMUNITY): Payer: Medicare HMO

## 2020-05-29 LAB — BASIC METABOLIC PANEL
Anion gap: 8 (ref 5–15)
Anion gap: 7 (ref 5–15)
BUN: 10 mg/dL (ref 8–23)
BUN: 12 mg/dL (ref 8–23)
CO2: 25 mmol/L (ref 22–32)
CO2: 26 mmol/L (ref 22–32)
Calcium: 8 mg/dL — ABNORMAL LOW (ref 8.9–10.3)
Calcium: 7.9 mg/dL — ABNORMAL LOW (ref 8.9–10.3)
Chloride: 104 mmol/L (ref 98–111)
Chloride: 104 mmol/L (ref 98–111)
Creatinine, Ser: 0.97 mg/dL (ref 0.61–1.24)
Creatinine, Ser: 1.07 mg/dL (ref 0.61–1.24)
GFR calc Af Amer: >60 mL/min (ref >60)
GFR calc Af Amer: >60 mL/min (ref >60)
GFR calc non Af Amer: >60 mL/min (ref >60)
GFR calc non Af Amer: >60 mL/min (ref >60)
Glucose, Bld: 117 mg/dL — ABNORMAL HIGH (ref 70–99)
Glucose, Bld: 116 mg/dL — ABNORMAL HIGH (ref 70–99)
Potassium: 3.5 mmol/L (ref 3.5–5.1)
Potassium: 3.9 mmol/L (ref 3.5–5.1)
Sodium: 137 mmol/L (ref 135–145)
Sodium: 137 mmol/L (ref 135–145)

## 2020-05-29 LAB — GLUCOSE, CAPILLARY
Glucose-Capillary: 100 mg/dL — ABNORMAL HIGH (ref 70–99)
Glucose-Capillary: 112 mg/dL — ABNORMAL HIGH (ref 70–99)
Glucose-Capillary: 120 mg/dL — ABNORMAL HIGH (ref 70–99)
Glucose-Capillary: 120 mg/dL — ABNORMAL HIGH (ref 70–99)
Glucose-Capillary: 132 mg/dL — ABNORMAL HIGH (ref 70–99)
Glucose-Capillary: 99 mg/dL (ref 70–99)

## 2020-05-29 LAB — CBC
HCT: 23 % — ABNORMAL LOW (ref 39.0–52.0)
Hemoglobin: 7.9 g/dL — ABNORMAL LOW (ref 13.0–17.0)
MCH: 30.9 pg (ref 26.0–34.0)
MCHC: 34.3 g/dL (ref 30.0–36.0)
MCV: 89.8 fL (ref 80.0–100.0)
Platelets: 67 10*3/uL — ABNORMAL LOW (ref 150–400)
RBC: 2.56 MIL/uL — ABNORMAL LOW (ref 4.22–5.81)
RDW: 14.7 % (ref 11.5–15.5)
WBC: 12.9 10*3/uL — ABNORMAL HIGH (ref 4.0–10.5)
nRBC: 0 % (ref 0.0–0.2)

## 2020-05-29 MED ORDER — POTASSIUM CHLORIDE 10 MEQ/50ML IV SOLN
10.0000 meq | INTRAVENOUS | Status: AC
  Administered 2020-05-29 (×2): 10 meq via INTRAVENOUS
  Filled 2020-05-29: qty 50

## 2020-05-29 MED ORDER — POTASSIUM CHLORIDE CRYS ER 20 MEQ PO TBCR
30.0000 meq | EXTENDED_RELEASE_TABLET | Freq: Three times a day (TID) | ORAL | Status: AC
  Administered 2020-05-29 (×3): 30 meq via ORAL
  Filled 2020-05-29 (×3): qty 1

## 2020-05-29 MED ORDER — FUROSEMIDE 10 MG/ML IJ SOLN
40.0000 mg | Freq: Two times a day (BID) | INTRAMUSCULAR | Status: AC
  Administered 2020-05-29 (×2): 40 mg via INTRAVENOUS
  Filled 2020-05-29 (×2): qty 4

## 2020-05-29 MED ORDER — ALUM & MAG HYDROXIDE-SIMETH 200-200-20 MG/5ML PO SUSP
30.0000 mL | Freq: Four times a day (QID) | ORAL | Status: DC | PRN
  Filled 2020-05-29: qty 30

## 2020-05-29 MED ORDER — POTASSIUM CHLORIDE 10 MEQ/50ML IV SOLN
INTRAVENOUS | Status: AC
  Administered 2020-05-29: 10 meq via INTRAVENOUS
  Filled 2020-05-29: qty 100

## 2020-05-29 MED ORDER — MELATONIN 5 MG PO TABS
20.0000 mg | ORAL_TABLET | Freq: Every day | ORAL | Status: DC
  Administered 2020-05-29 – 2020-06-01 (×4): 20 mg via ORAL
  Filled 2020-05-29 (×4): qty 4

## 2020-05-29 MED ORDER — TRAZODONE HCL 50 MG PO TABS
50.0000 mg | ORAL_TABLET | Freq: Every evening | ORAL | Status: DC | PRN
  Administered 2020-05-29 – 2020-06-01 (×4): 50 mg via ORAL
  Filled 2020-05-29 (×4): qty 1

## 2020-05-29 MED FILL — Magnesium Sulfate Inj 50%: INTRAMUSCULAR | Qty: 10 | Status: AC

## 2020-05-29 MED FILL — Heparin Sodium (Porcine) Inj 1000 Unit/ML: INTRAMUSCULAR | Qty: 30 | Status: AC

## 2020-05-29 MED FILL — Potassium Chloride Inj 2 mEq/ML: INTRAVENOUS | Qty: 40 | Status: AC

## 2020-05-29 NOTE — Progress Notes (Addendum)
[facility] DAILY ICU PROGRESS NOTE                   [address]            [place], [address]          [phone]   2 Days Post-Op Procedure(s) (LRB): REDO CORONARY ARTERY BYPASS GRAFTING (CABG), ON PUMP, TIMES TWO, USING RIGHT INTERNAL MAMMARY ARTERY AND LEFT RADIAL ARTERY (OPEN) (N/A) RADIAL ARTERY HARVEST (Left) TRANSESOPHAGEAL ECHOCARDIOGRAM (TEE) (N/A) MEDIAN STERNOTOMY (N/A)  Total Length of Stay:  LOS: 2 days   Subjective: Awake and alert, in the bedside chair after walking this morning. Says he feel generally uncomfortable but no specific pains.  Dopamine at 0.48mg/kg/min Milrinone 0.253m/kg/min  Objective: Vital signs in last 24 hours: Temp:  [98.2 F (36.8 C)-99 F (37.2 C)] 98.6 F (37 C) (07/15 0400) Pulse Rate:  [86-114] 95 (07/15 0500) Cardiac Rhythm: Sinus tachycardia (07/15 0400) Resp:  [0-29] 16 (07/15 0500) BP: (109-159)/(49-105) 127/75 (07/15 0500) SpO2:  [84 %-99 %] 96 % (07/15 0500) Arterial Line BP: (107-187)/(40-84) 149/59 (07/15 0500) Weight:  [115.3 kg] 115.3 kg (07/15 0530)  Filed Weights   05/28/20 0500 05/29/20 0530  Weight: 115.6 kg 115.3 kg    Weight change: -0.3 kg   Hemodynamic parameters for last 24 hours: PAP: (20-31)/(8-15) 26/15 CO:  [7.6 L/min-10 L/min] 7.6 L/min CI:  [3.5 L/min/m2-4.5 L/min/m2] 3.5 L/min/m2  Intake/Output from previous day: 07/14 0701 - 07/15 0700 In: 1398.2 [I.V.:529.8; Blood:315; IV Piggyback:553.4] Out: 3975 [Urine:3583; Chest Tube:392]  Intake/Output this shift: No intake/output data recorded.  Current Meds: Scheduled Meds: . acetaminophen  1,000 mg Oral Q6H   Or  . acetaminophen (TYLENOL) oral liquid 160 mg/5 mL  1,000 mg Per Tube Q6H  . aspirin EC  325 mg Oral Daily   Or  . aspirin  324 mg Per Tube Daily  . atorvastatin  80 mg Oral Daily  . bisacodyl  10 mg Oral Daily   Or  . bisacodyl  10 mg Rectal Daily  . buPROPion  300 mg Oral Daily  . calcipotriene  1 application  Topical BID  . chlorhexidine gluconate (MEDLINE KIT)  15 mL Mouth Rinse BID  . chlorhexidine gluconate (MEDLINE KIT)  15 mL Mouth Rinse BID  . Chlorhexidine Gluconate Cloth  6 each Topical Daily  . clobetasol cream  1 application Topical Daily  . docusate sodium  200 mg Oral Daily  . insulin aspart  0-24 Units Subcutaneous Q4H  . isosorbide mononitrate  15 mg Oral Daily  . mouth rinse  15 mL Mouth Rinse BID  . metoprolol tartrate  12.5 mg Oral BID   Or  . metoprolol tartrate  12.5 mg Per Tube BID  . pantoprazole  40 mg Oral Daily  . potassium chloride  20 mEq Oral BID  . sodium chloride flush  10-40 mL Intracatheter Q12H  . sodium chloride flush  3 mL Intravenous Q12H   Continuous Infusions: . sodium chloride Stopped (05/28/20 0859)  . sodium chloride    . sodium chloride 20 mL/hr at 05/27/20 1830  . cefUROXime (ZINACEF)  IV 1.5 g (05/29/20 0738)  . dexmedetomidine (PRECEDEX) IV infusion Stopped (05/28/20 0108)  . DOPamine 3 mcg/kg/min (05/29/20 0618)  . insulin Stopped (05/28/20 1301)  . lactated ringers    . lactated ringers    . lactated ringers    . milrinone 0.25 mcg/kg/min (05/29/20 0600)  . nitroGLYCERIN Stopped (05/28/20 1711)  . norepinephrine (  LEVOPHED) Adult infusion Stopped (05/28/20 1325)  . phenylephrine (NEO-SYNEPHRINE) Adult infusion Stopped (05/28/20 1741)  . potassium chloride 10 mEq (05/29/20 0740)   PRN Meds:.sodium chloride, dextrose, lactated ringers, metoprolol tartrate, midazolam, morphine injection, ondansetron (ZOFRAN) IV, oxyCODONE, sodium chloride flush, sodium chloride flush, traMADol  General appearance: alert, cooperative and mild distress Neurologic: intact Heart: SR / ST with occasional PVC's.  Lungs: Breath sounds clear, diminished anterior. CXR shows good expansion of both lungs, no effusions. CT drainage has tapered off.  Abdomen: soft, NT, absent bowel sounds.  Extremities: significant peripheral edema. No m/s deficits in left hand.    Wound: The sternal incision is covered with a dry Aquacel dressing.   Lab Results: CBC: Recent Labs    05/28/20 1430 05/29/20 0311  WBC 11.2* 12.9*  HGB 8.4* 7.9*  HCT 24.3* 23.0*  PLT 74* 67*   BMET:  Recent Labs    05/28/20 1633 05/29/20 0311  NA 137 137  K 3.6 3.5  CL 105 104  CO2 24 25  GLUCOSE 134* 117*  BUN 11 10  CREATININE 0.99 0.97  CALCIUM 7.7* 8.0*    CMET: Lab Results  Component Value Date   WBC 12.9 (H) 05/29/2020   HGB 7.9 (L) 05/29/2020   HCT 23.0 (L) 05/29/2020   PLT 67 (L) 05/29/2020   GLUCOSE 117 (H) 05/29/2020   CHOL 119 01/10/2020   TRIG 90.0 01/10/2020   HDL 28.90 (L) 01/10/2020   LDLDIRECT 66.7 11/30/2011   LDLCALC 72 01/10/2020   ALT 38 05/23/2020   AST 26 05/23/2020   NA 137 05/29/2020   K 3.5 05/29/2020   CL 104 05/29/2020   CREATININE 0.97 05/29/2020   BUN 10 05/29/2020   CO2 25 05/29/2020   TSH 2.57 01/10/2020   PSA 1.30 01/10/2020   INR 1.4 (H) 05/28/2020   HGBA1C 5.6 05/23/2020      PT/INR:  Recent Labs    05/28/20 0634  LABPROT 17.0*  INR 1.4*   Radiology: No results found.   Assessment/Plan: S/P Procedure(s) (LRB): REDO CORONARY ARTERY BYPASS GRAFTING (CABG), ON PUMP, TIMES TWO, USING RIGHT INTERNAL MAMMARY ARTERY AND LEFT RADIAL ARTERY (OPEN) (N/A) RADIAL ARTERY HARVEST (Left) TRANSESOPHAGEAL ECHOCARDIOGRAM (TEE) (N/A) MEDIAN STERNOTOMY (N/A)   -POD 2 re-do CABG x 2 with right IMA and left radial artery. Normal pre-op LV function, moderate LV hypertrophy.  Hemodynamically stable. Wean milriinone and DA. Continue ASA, statin.  Remove chest tubes.   -Volume Excess- Pre-op Wt not recorded but Pt says he weighed 229lbs (104kg) prior to surgery. Currently about 9kg positive. Continue diuresis.   -History of HTN- MAP currently 90 and he is tachycardic.  Both should improve as dopamine is weaned off.  May need to increase metoprolol by this afternoon.   -Expected acute blood loss anemia- Transfusce 1 unit  PRBC's yesterday. Hct 23% and  minimal losses past 24 hours. Monitor.   -Endo- no prior h/o DM, glucose 12-'s to 130's.  Continue to monitor and provide SSI.  -Hypokalemia- replace      Antony Odea, PA-C [phone] 05/29/2020 7:52 AM  Plan weaning off dopamine  Slow progression  I have seen and examined Dave Robles and agree with the above assessment  and plan.  Grace Isaac MD [facility] [phone] Office [phone] 05/29/2020 8:17 AM

## 2020-05-29 NOTE — Op Note (Signed)
NAME: Dave Robles, Dave Robles MEDICAL RECORD ZR:[id number] ACCOUNT [id number] DATE OF BIRTH:Nov 04, 1946 FACILITY: [facility] LOCATION: [facility] PHYSICIAN:Cailie Bosshart BServando Snare, MD  OPERATIVE REPORT  DATE OF PROCEDURE:  05/27/2020  NAME OF PROCEDURE:  Redo coronary artery bypass grafting with free right internal mammary artery to the obtuse marginal and left radial graft to the posterior descending coronary artery with open left radial harvesting  SURGEON:  Lanelle Bal, MD  FIRST ASSISTANT:  Jadene Pierini, PA  BRIEF HISTORY:  The patient is a 74 year old male who had previously undergone coronary artery bypass grafting by Dr. Redmond Pulling in August 1991.  The patient went 25 years with no cardiac symptoms.  Five years ago while visiting in [place], he had an acute  myocardial infarction and repeat catheterization with stenting of the proximal right coronary artery vein graft.  He has been maintained on statin and Plavix.  Because of recurrent dyspnea on exertion, the patient underwent repeat cardiac catheterization  by Dr. Tamala Julian, which showed in-stent stenosis in the proximal right coronary artery graft and progression of disease in the proximal circumflex.  The LAD was totally occluded with a large left internal mammary artery graft supplying it with collateral  filling to the right.  Because of the patient's symptoms, further stenting of the right coronary artery and with progression of disease in the circumflex which had not been bypassed previously, he was referred for consideration of redo coronary artery  bypass grafting.  Risks and options were discussed with him in detail.  In spite of attempts with medical records to obtain his old operative report, we were not able to do this.  DESCRIPTION OF PROCEDURE:  The patient with Swan-Ganz and arterial line monitors in place, underwent general endotracheal anesthesia without incident.  The skin of the chest and legs was prepped with Betadine, draped in the usual  sterile manner.  The  vein had previously been harvested from the left leg.  The left arm was placed out and also prepped with Betadine and draped sterilely.  Preoperative Doppler studies indicated in the nondominant hand, the left, the patient had a preserved palmar arch.   Appropriate timeout was performed and we proceeded first with harvesting of the left radial artery.  A curvilinear incision was made along the volar aspect of the arm.  Dissection was carried down and the radial artery identified and using the Harmonic  scalpel, small side branches were cauterized.  Prior to removal of the artery, a repeat Doppler study to confirm a palmar arch intact was done.  The vessel was then ligated proximally and distally and a nice good size radial artery was harvested.  This  was flushed with heparinized saline.  The incision was then closed and the arm retucked.  We then proceeded with opening the chest through the old incision and then using a sagittal saw, cut through the bone.  With slight elevation, the tissue under and  adherent to the back of the sternum was dissected free to the point where we could place a retractor and then dissected out the ascending aorta and right atrium.  The patient's right atrium was very small.  At the ascending aorta, we were able to  identify the takeoff of the vein graft to a diagonal and a vein graft to the right.  It appeared on the vein graft to the right approximately 2 cm from the origin, there was an anastomosis, possibly for adding a piece of vein to lengthen the graft.  It  also appeared that  what was thought to be a graft to the posterior descending was actually a sequential graft to the PD and PL.  With the aorta dissected out sufficiently to place an aortic cannula, we then elevated the right chest wall and dissected the  right internal mammary artery down as a pedicle graft.  Ultimately, we determined that this graft was not of sufficient length to place as a  pedicle to either the posterior descending or through the transverse sinus to the circumflex, so the vessel was  divided and prepared as a free graft.  We then heparinized the patient.  The ascending aorta was cannulated.  A dual stage venous cannula was placed into the right atrium.  Retrograde cardioplegic catheter was placed into the coronary sinus with  assistance of the TEE probe.  The patient was then placed on cardiopulmonary bypass and the remainder of the dissection of the left ventricle, including identifying the insertion of the left internal mammary to the LAD.  The vein graft to the diagonal  was densely adherent to the mammary and the diagonal was under the mammary artery.  We decided that the size of the diagonal that attempting to bypass this was not a wise choice.  The heart was elevated and we were able to identify a circumflex branch.   This dissection was somewhat prolonged and tedious, but we were able to dissect out the internal mammary on the left to the point of being able to put a bulldog on the mammary.  The patient's body temperature was cooled to 32 degrees.  An aortic root  vent cardioplegia needle was introduced into the ascending aorta.  The patient's body temperature was cooled to 32 degrees.  Aortic crossclamp was applied.  Five hundred mL of cold blood potassium cardioplegia was administered antegrade.  The bulldog was  placed on the mammary artery.  Additional retrograde cardioplegia was administered with diastolic arrest of the heart.  We then first turned our attention to the posterior descending coronary artery, which was opened and admitted a 1.5 mm probe  distally.  Using a running 8-0 Prolene, the left radial artery distally was anastomosed to the posterior descending.  Additional cold blood cardioplegia was administered down this radial artery.  The heart was then elevated and with some difficulty  because of adhesions, we were able to finally free the site of the  obtuse marginal vessel, which was opened and admitted a 1.5 mm probe.  Using a running 8-0 Prolene, the free right internal mammary artery was anastomosed to the obtuse marginal.  The  graft was then brought underneath the mammary artery.  Intermittently, additional cold blood cardioplegia was given retrograde and down the grafts.  The takeoff of the old vein graft to the right was stented, so an additional punch aortotomy was made to  anastomose the left radial artery to the ascending aorta.  The free right internal mammary artery graft was then prepared and through an opening in the hood of the old diagonal graft was anastomosed to the ascending aorta.  The heart was allowed to  passively fill.  The patient's body temperature was rewarmed.  Aortic crossclamp was removed with a total crossclamp time of 121 minutes.  The patient spontaneously converted to a sinus rhythm.  Low-dose milrinone and dopamine infusions were started.   With the patient's body temperature rewarmed to 37 degrees, he was then ventilated and weaned from cardiopulmonary bypass without difficulty.  TEE showed reasonable LV function with the ventricle being  slightly underfilled.  He was separated from  cardiopulmonary bypass without difficulty.  He was decannulated in the usual fashion.  Protamine sulfate was administered.  Because of oozing, ____  analysis suggested cryoprecipitate and platelet infusions, which were given.  With the operative field   reasonably hemostatic, a right pleural tube was left in place and 2 mediastinal tubes were left in place.  The sternum was then closed with #6 stainless steel wire.  Fascia closed with interrupted 0 Vicryl, running 3-0 Vicryl subcutaneous tissue, and 3-0  subcuticular stitch in skin edges.  Dry dressings were applied with the sponge and needle count reported as correct.  The patient was then transferred to the surgical intensive care unit for further postoperative care.  Total pump time was  179 minutes.  He did not require any packed red blood cells, but was given 1 pack of platelets and 1 cryoprecipitate.  VN/NUANCE  D:05/29/2020 T:05/29/2020 JOB:[id number]

## 2020-05-29 NOTE — Progress Notes (Signed)
CTS  Patient examined and record reviewed.Hemodynamics stable,labs satisfactory.Patient had stable day.Continue current care.  Vitals:   05/29/20 1900 05/29/20 2000  BP: (!) 97/59 117/70  Pulse: 85 84  Resp: 17 16  Temp:    SpO2: 97% 97%    Dave Robles 05/29/2020

## 2020-05-30 ENCOUNTER — Inpatient Hospital Stay (HOSPITAL_COMMUNITY): Payer: Medicare HMO

## 2020-05-30 LAB — BASIC METABOLIC PANEL
Anion gap: 8 (ref 5–15)
BUN: 13 mg/dL (ref 8–23)
CO2: 24 mmol/L (ref 22–32)
Calcium: 7.9 mg/dL — ABNORMAL LOW (ref 8.9–10.3)
Chloride: 105 mmol/L (ref 98–111)
Creatinine, Ser: 1.02 mg/dL (ref 0.61–1.24)
GFR calc Af Amer: >60 mL/min (ref >60)
GFR calc non Af Amer: >60 mL/min (ref >60)
Glucose, Bld: 88 mg/dL (ref 70–99)
Potassium: 4.7 mmol/L (ref 3.5–5.1)
Sodium: 137 mmol/L (ref 135–145)

## 2020-05-30 LAB — GLUCOSE, CAPILLARY
Glucose-Capillary: 102 mg/dL — ABNORMAL HIGH (ref 70–99)
Glucose-Capillary: 104 mg/dL — ABNORMAL HIGH (ref 70–99)
Glucose-Capillary: 86 mg/dL (ref 70–99)
Glucose-Capillary: 89 mg/dL (ref 70–99)
Glucose-Capillary: 93 mg/dL (ref 70–99)
Glucose-Capillary: 96 mg/dL (ref 70–99)

## 2020-05-30 LAB — CBC
HCT: 23.6 % — ABNORMAL LOW (ref 39.0–52.0)
Hemoglobin: 7.7 g/dL — ABNORMAL LOW (ref 13.0–17.0)
MCH: 30.6 pg (ref 26.0–34.0)
MCHC: 32.6 g/dL (ref 30.0–36.0)
MCV: 93.7 fL (ref 80.0–100.0)
Platelets: 68 10*3/uL — ABNORMAL LOW (ref 150–400)
RBC: 2.52 MIL/uL — ABNORMAL LOW (ref 4.22–5.81)
RDW: 14.9 % (ref 11.5–15.5)
WBC: 12.7 10*3/uL — ABNORMAL HIGH (ref 4.0–10.5)
nRBC: 0 % (ref 0.0–0.2)

## 2020-05-30 LAB — HEPARIN INDUCED PLATELET AB (HIT ANTIBODY): Heparin Induced Plt Ab: 0.073 OD (ref 0.000–0.400)

## 2020-05-30 MED ORDER — FUROSEMIDE 40 MG PO TABS
40.0000 mg | ORAL_TABLET | Freq: Every day | ORAL | Status: DC
  Administered 2020-05-30 – 2020-06-02 (×4): 40 mg via ORAL
  Filled 2020-05-30 (×4): qty 1

## 2020-05-30 MED ORDER — CLOPIDOGREL BISULFATE 75 MG PO TABS
75.0000 mg | ORAL_TABLET | Freq: Every day | ORAL | Status: DC
  Administered 2020-05-30 – 2020-06-02 (×4): 75 mg via ORAL
  Filled 2020-05-30 (×4): qty 1

## 2020-05-30 MED ORDER — ONDANSETRON HCL 4 MG PO TABS: 4.0000 mg | ORAL_TABLET | Freq: Four times a day (QID) | ORAL | Status: DC | PRN

## 2020-05-30 MED ORDER — ONDANSETRON HCL 4 MG/2ML IJ SOLN: 4.0000 mg | Freq: Four times a day (QID) | INTRAMUSCULAR | Status: DC | PRN

## 2020-05-30 MED ORDER — SODIUM CHLORIDE 0.9% FLUSH: 3.0000 mL | INTRAVENOUS | Status: DC | PRN

## 2020-05-30 MED ORDER — SODIUM CHLORIDE 0.9 % IV SOLN: 250.0000 mL | INTRAVENOUS | Status: DC | PRN

## 2020-05-30 MED ORDER — METOPROLOL TARTRATE 25 MG PO TABS
25.0000 mg | ORAL_TABLET | Freq: Two times a day (BID) | ORAL | Status: DC
  Administered 2020-05-30 – 2020-06-02 (×7): 25 mg via ORAL
  Filled 2020-05-30 (×6): qty 1

## 2020-05-30 MED ORDER — ASPIRIN EC 81 MG PO TBEC
81.0000 mg | DELAYED_RELEASE_TABLET | Freq: Every day | ORAL | Status: DC
  Administered 2020-05-30 – 2020-06-02 (×4): 81 mg via ORAL
  Filled 2020-05-30 (×4): qty 1

## 2020-05-30 MED ORDER — ~~LOC~~ CARDIAC SURGERY, PATIENT & FAMILY EDUCATION: Freq: Once | Status: AC

## 2020-05-30 MED ORDER — POTASSIUM CHLORIDE CRYS ER 20 MEQ PO TBCR
20.0000 meq | EXTENDED_RELEASE_TABLET | Freq: Every day | ORAL | Status: DC
  Administered 2020-05-30 – 2020-06-02 (×4): 20 meq via ORAL
  Filled 2020-05-30 (×4): qty 1

## 2020-05-30 MED ORDER — INSULIN ASPART 100 UNIT/ML ~~LOC~~ SOLN: 0.0000 [IU] | Freq: Three times a day (TID) | SUBCUTANEOUS | Status: DC

## 2020-05-30 MED ORDER — DOCUSATE SODIUM 100 MG PO CAPS: 200.0000 mg | ORAL_CAPSULE | Freq: Every day | ORAL | Status: DC

## 2020-05-30 MED ORDER — SODIUM CHLORIDE 0.9% FLUSH
3.0000 mL | Freq: Two times a day (BID) | INTRAVENOUS | Status: DC
  Administered 2020-05-31 – 2020-06-01 (×4): 3 mL via INTRAVENOUS

## 2020-05-30 MED FILL — Sodium Chloride IV Soln 0.9%: INTRAVENOUS | Qty: 4000 | Status: AC

## 2020-05-30 MED FILL — Electrolyte-R (PH 7.4) Solution: INTRAVENOUS | Qty: 6000 | Status: AC

## 2020-05-30 MED FILL — Lidocaine HCl Local Soln Prefilled Syringe 100 MG/5ML (2%): INTRAMUSCULAR | Qty: 5 | Status: AC

## 2020-05-30 MED FILL — Sodium Bicarbonate IV Soln 8.4%: INTRAVENOUS | Qty: 50 | Status: AC

## 2020-05-30 MED FILL — Mannitol IV Soln 20%: INTRAVENOUS | Qty: 500 | Status: AC

## 2020-05-30 NOTE — Evaluation (Signed)
Physical Therapy Evaluation Patient Details Name: Dave Robles MRN: [id number] DOB: May 04, 1946 Today's Date: 05/30/2020   History of Present Illness  Pt is 74 yo male who was admitted to the hospital redo of CABG due to 3-vessel coronary artery disease with total occlusion of the right coronary artery and LAD.  Pt is s/p redo CABG x 2 using R internal mammary artery and L radial artery on 05/27/20. Pt with PMH including CABG in 1991, AAA, arthritis, HTN, and MI.  Clinical Impression  Pt admitted with above diagnosis. Pt making good progress for POD #3.  Pt required min A to elevate trunk from sidelying position but was able to ambulate with min guard for safety.  Required min cues for transfer techniques and sternal precautions.  Pt educated on sternal precautions and therapy roll.   Pt currently with functional limitations due to the deficits listed below (see PT Problem List). Pt will benefit from skilled PT to increase their independence and safety with mobility to allow discharge to the venue listed below.       Follow Up Recommendations No PT follow up  Could benefit from cardiac rehab at d/c.     Equipment Recommendations  None recommended by PT    Recommendations for Other Services       Precautions / Restrictions Precautions Precautions: Sternal Precaution Booklet Issued: No      Mobility  Bed Mobility Overal bed mobility: Needs Assistance Bed Mobility: Sidelying to Sit   Sidelying to sit: Min assist       General bed mobility comments: log roll technique with min A to boost trunk and cues to use legs to pull toward EOB  Transfers Overall transfer level: Needs assistance Equipment used: Rolling walker (2 wheeled) Transfers: Sit to/from Stand Sit to Stand: Min guard         General transfer comment: performed x 2 with use of cardiac pillow to promote sternal precautions  Ambulation/Gait Ambulation/Gait assistance: Min guard Gait Distance (Feet): 250  Feet Assistive device: Rolling walker (2 wheeled) Gait Pattern/deviations: Step-through pattern;Decreased stride length Gait velocity: decreased   General Gait Details: cues for RW; slow but steady gait  Stairs            Wheelchair Mobility    Modified Rankin (Stroke Patients Only)       Balance Overall balance assessment: Needs assistance Sitting-balance support: No upper extremity supported;Feet supported Sitting balance-Leahy Scale: Good     Standing balance support: No upper extremity supported Standing balance-Leahy Scale: Fair Standing balance comment: Able to maintain static standing without UE support but did not challenge weight shifts without use of RW                             Pertinent Vitals/Pain Pain Assessment: No/denies pain    Home Living Family/patient expects to be discharged to:: Private residence Living Arrangements: Spouse/significant other Available Help at Discharge: Family;Available PRN/intermittently (wife could be at home more initially) Type of Home: House Home Access: Stairs to enter Entrance Stairs-Rails: Right Entrance Stairs-Number of Steps: 4 Home Layout: Two level;Able to live on main level with bedroom/bathroom Home Equipment: Cane - single point;Crutches;Shower seat;Bedside commode      Prior Function Level of Independence: Independent         Comments: Rides road bike for exercise; pt is retired     Journalist, newspaper        Extremity/Trunk Assessment   Upper  Extremity Assessment Upper Extremity Assessment: Overall WFL for tasks assessed (within sternal precautions)    Lower Extremity Assessment Lower Extremity Assessment: Overall WFL for tasks assessed    Cervical / Trunk Assessment Cervical / Trunk Assessment: Normal  Communication   Communication: No difficulties  Cognition Arousal/Alertness: Awake/alert Behavior During Therapy: WFL for tasks assessed/performed Overall Cognitive Status:  Within Functional Limits for tasks assessed                                        General Comments General comments (skin integrity, edema, etc.): VSS; pt educated on and provided teach back on sternal precautions    Exercises     Assessment/Plan    PT Assessment Patient needs continued PT services  PT Problem List Decreased strength;Decreased mobility;Decreased safety awareness;Decreased range of motion;Decreased coordination;Decreased knowledge of precautions;Decreased activity tolerance;Cardiopulmonary status limiting activity;Decreased balance;Decreased knowledge of use of DME       PT Treatment Interventions DME instruction;Therapeutic activities;Gait training;Therapeutic exercise;Patient/family education;Stair training;Balance training;Functional mobility training    PT Goals (Current goals can be found in the Care Plan section)  Acute Rehab PT Goals Patient Stated Goal: return home PT Goal Formulation: With patient/family Time For Goal Achievement: 06/13/20 Potential to Achieve Goals: Good    Frequency Min 3X/week   Barriers to discharge        Co-evaluation               AM-PAC PT "6 Clicks" Mobility  Outcome Measure Help needed turning from your back to your side while in a flat bed without using bedrails?: A Little Help needed moving from lying on your back to sitting on the side of a flat bed without using bedrails?: A Little Help needed moving to and from a bed to a chair (including a wheelchair)?: None Help needed standing up from a chair using your arms (e.g., wheelchair or bedside chair)?: None Help needed to walk in hospital room?: None Help needed climbing 3-5 steps with a railing? : A Little 6 Click Score: 21    End of Session Equipment Utilized During Treatment: Gait belt Activity Tolerance: Patient tolerated treatment well Patient left: in chair;with family/visitor present;with call bell/phone within reach Nurse Communication:  Mobility status PT Visit Diagnosis: Other abnormalities of gait and mobility (R26.89);Muscle weakness (generalized) (M62.81)    Time: 2563-8937 PT Time Calculation (min) (ACUTE ONLY): 25 min   Charges:   PT Evaluation $PT Eval Moderate Complexity: 1 Mod PT Treatments $Therapeutic Activity: 8-22 mins        Abran Richard, PT Acute Rehab Services Pager [phone] [facility] [phone]    Karlton Lemon 05/30/2020, 5:51 PM

## 2020-05-30 NOTE — Progress Notes (Signed)
Pt received from [place]. VSS. CHG complete. Pt oriented to room and unit. Will continue to monitor.  Clyde Canterbury, RN

## 2020-05-30 NOTE — Progress Notes (Signed)
Mobility Specialist - Progress Note   05/30/20 1431  Mobility  Activity  (Cancel)  Mobility performed by Mobility specialist   After discussion w/ PT, I will not see pt today as they are currently c/o feeling unwell. Pt has walked 2x today and still needs to have PT.   Pricilla Handler Mobility Specialist Mobility Specialist Phone: [phone]

## 2020-05-30 NOTE — Progress Notes (Addendum)
[facility] DAILY ICU PROGRESS NOTE                   [address]            [place] [address]          [phone]   3 Days Post-Op Procedure(s) (LRB): REDO CORONARY ARTERY BYPASS GRAFTING (CABG), ON PUMP, TIMES TWO, USING RIGHT INTERNAL MAMMARY ARTERY AND LEFT RADIAL ARTERY (OPEN) (N/A) RADIAL ARTERY HARVEST (Left) TRANSESOPHAGEAL ECHOCARDIOGRAM (TEE) (N/A) MEDIAN STERNOTOMY (N/A)  Total Length of Stay:  LOS: 3 days   Subjective: Up in the chair, appetite good. Walked in the unit.  Dopamine and milrinone off.  O2 sats excellent on RA.   Objective: Vital signs in last 24 hours: Temp:  [97.6 F (36.4 C)-98.7 F (37.1 C)] 98.7 F (37.1 C) (07/16 0700) Pulse Rate:  [70-121] 91 (07/16 0700) Cardiac Rhythm: Sinus tachycardia (07/16 0620) Resp:  [0-21] 17 (07/16 0700) BP: (89-145)/(54-87) 143/87 (07/16 0700) SpO2:  [94 %-100 %] 98 % (07/16 0700) Weight:  [113.5 kg] 113.5 kg (07/16 0600)  Filed Weights   05/28/20 0500 05/29/20 0530 05/30/20 0600  Weight: 115.6 kg 115.3 kg 113.5 kg    Weight change: -1.8 kg     Intake/Output from previous day: 07/15 0701 - 07/16 0700 In: 844.8 [P.O.:600; I.V.:54.7; IV Piggyback:190.2] Out: 2130 [Urine:3215; Chest Tube:50]  Intake/Output this shift: No intake/output data recorded.  Current Meds: Scheduled Meds: . acetaminophen  1,000 mg Oral Q6H  . aspirin EC  81 mg Oral Daily  . atorvastatin  80 mg Oral Daily  . bisacodyl  10 mg Oral Daily   Or  . bisacodyl  10 mg Rectal Daily  . buPROPion  300 mg Oral Daily  . Chlorhexidine Gluconate Cloth  6 each Topical Daily  . clobetasol cream  1 application Topical Daily  . clopidogrel  75 mg Oral Daily  . [facility], Patient & Family Education   Does not apply Once  . docusate sodium  200 mg Oral Daily  . insulin aspart  0-24 Units Subcutaneous Q4H  . insulin aspart  0-24 Units Subcutaneous TID AC & HS  . isosorbide mononitrate  15 mg Oral Daily  . mouth  rinse  15 mL Mouth Rinse BID  . melatonin  20 mg Oral QHS  . metoprolol tartrate  12.5 mg Oral BID   Or  . metoprolol tartrate  12.5 mg Per Tube BID  . metoprolol tartrate  25 mg Oral BID  . pantoprazole  40 mg Oral Daily  . potassium chloride  20 mEq Oral Daily  . sodium chloride flush  3 mL Intravenous Q12H  . sodium chloride flush  3 mL Intravenous Q12H   Continuous Infusions: . sodium chloride    . DOPamine Stopped (05/29/20 1116)  . insulin Stopped (05/28/20 1301)  . lactated ringers    . lactated ringers    . lactated ringers    . milrinone Stopped (05/29/20 1116)  . nitroGLYCERIN Stopped (05/28/20 1711)  . phenylephrine (NEO-SYNEPHRINE) Adult infusion Stopped (05/28/20 1741)   PRN Meds:.sodium chloride, alum & mag hydroxide-simeth, dextrose, lactated ringers, metoprolol tartrate, midazolam, morphine injection, ondansetron (ZOFRAN) IV, ondansetron **OR** ondansetron (ZOFRAN) IV, oxyCODONE, sodium chloride flush, sodium chloride flush, sodium chloride flush, traMADol, traZODone  Physical Exam: General appearance: alert, cooperative and mild distress Neurologic: intact Heart: SR with occasional PVC's.  Lungs: Breath sounds clear, diminished anterior. CXR shows good expansion of both lungs, mild  bibasilar ATX,  no effusions. CT 's out. Abdomen: soft, NT. Extremities: significant peripheral edema. No m/s deficits in left hand. Incision dry Wound: The sternal incision is covered with a dry Aquacel dressing.    Lab Results: CBC: Recent Labs    05/29/20 0311 05/30/20 0431  WBC 12.9* 12.7*  HGB 7.9* 7.7*  HCT 23.0* 23.6*  PLT 67* 68*   BMET:  Recent Labs    05/29/20 1520 05/30/20 0431  NA 137 137  K 3.9 4.7  CL 104 105  CO2 26 24  GLUCOSE 116* 88  BUN 12 13  CREATININE 1.07 1.02  CALCIUM 7.9* 7.9*    CMET: Lab Results  Component Value Date   WBC 12.7 (H) 05/30/2020   HGB 7.7 (L) 05/30/2020   HCT 23.6 (L) 05/30/2020   PLT 68 (L) 05/30/2020   GLUCOSE 88  05/30/2020   CHOL 119 01/10/2020   TRIG 90.0 01/10/2020   HDL 28.90 (L) 01/10/2020   LDLDIRECT 66.7 11/30/2011   LDLCALC 72 01/10/2020   ALT 38 05/23/2020   AST 26 05/23/2020   NA 137 05/30/2020   K 4.7 05/30/2020   CL 105 05/30/2020   CREATININE 1.02 05/30/2020   BUN 13 05/30/2020   CO2 24 05/30/2020   TSH 2.57 01/10/2020   PSA 1.30 01/10/2020   INR 1.4 (H) 05/28/2020   HGBA1C 5.6 05/23/2020      PT/INR:  Recent Labs    05/28/20 0634  LABPROT 17.0*  INR 1.4*   Radiology: No results found.   Assessment/Plan: S/P Procedure(s) (LRB): REDO CORONARY ARTERY BYPASS GRAFTING (CABG), ON PUMP, TIMES TWO, USING RIGHT INTERNAL MAMMARY ARTERY AND LEFT RADIAL ARTERY (OPEN) (N/A) RADIAL ARTERY HARVEST (Left) TRANSESOPHAGEAL ECHOCARDIOGRAM (TEE) (N/A) MEDIAN STERNOTOMY (N/A)  -POD 3 re-do CABG x 2 with right IMA and left radial artery. Normal pre-op LV function, moderate LV hypertrophy.  Hemodynamically stable off milrinone and DA. Continue ASA but reduce to 81mg , re-start his Plavix. Continue statin.  Transfer to 4E and advance activity.   -Volume Excess- Pre-op Wt not recorded but Pt says he weighed 229lbs (104kg) prior to surgery. Good response to diuresis, Wt now ~4kg+. Continue oral Lasix.  -History of HTN- MAP currently 100+. Will increase metoprolol to 25mg  PO BID. Suspect we will need to resume his ACE-I as well.   -Expected acute blood loss anemia- Transfusce 1 unit PRBC's POD-1. Hct 23% and  stable.  Monitor.   -Endo- no prior h/o DM, glucose well controlled.  Continue to monitor and provide SSI.  -Hypokalemia- corrected. Continue supplements while on Lasix.   Antony Odea, PA-C [phone] 05/30/2020 7:57 AM  H?H stable , plts still low avoiding all heparin exposure , HIT pending  Plan transfer to 4e stepdow I have seen and examined Dave Robles and agree with the above assessment  and plan.  Grace Isaac MD Beeper [phone] Office  [phone] 05/30/2020 11:33 AM

## 2020-05-31 ENCOUNTER — Encounter (HOSPITAL_COMMUNITY): Payer: Self-pay | Admitting: Cardiothoracic Surgery

## 2020-05-31 ENCOUNTER — Inpatient Hospital Stay (HOSPITAL_COMMUNITY): Payer: Medicare HMO

## 2020-05-31 LAB — TYPE AND SCREEN
ABO/RH(D): O POS
Antibody Screen: NEGATIVE
Unit division: 0
Unit division: 0
Unit division: 0
Unit division: 0
Unit division: 0
Unit division: 0
Unit division: 0
Unit division: 0

## 2020-05-31 LAB — BPAM RBC
Blood Product Expiration Date: 202108092359
Blood Product Expiration Date: 202108092359
Blood Product Expiration Date: 202108092359
Blood Product Expiration Date: 202108092359
Blood Product Expiration Date: 202108092359
Blood Product Expiration Date: 202108092359
Blood Product Expiration Date: 202108092359
Blood Product Expiration Date: 202108092359
ISSUE DATE / TIME: 202107130825
ISSUE DATE / TIME: 202107130825
ISSUE DATE / TIME: 202107130825
ISSUE DATE / TIME: 202107130825
ISSUE DATE / TIME: 202107132040
ISSUE DATE / TIME: 202107132040
ISSUE DATE / TIME: 202107140838
Unit Type and Rh: 5100
Unit Type and Rh: 5100
Unit Type and Rh: 5100
Unit Type and Rh: 5100
Unit Type and Rh: 5100
Unit Type and Rh: 5100
Unit Type and Rh: 5100
Unit Type and Rh: 5100

## 2020-05-31 LAB — BASIC METABOLIC PANEL
Anion gap: 10 (ref 5–15)
BUN: 15 mg/dL (ref 8–23)
CO2: 23 mmol/L (ref 22–32)
Calcium: 8.4 mg/dL — ABNORMAL LOW (ref 8.9–10.3)
Chloride: 102 mmol/L (ref 98–111)
Creatinine, Ser: 1.05 mg/dL (ref 0.61–1.24)
GFR calc Af Amer: >60 mL/min (ref >60)
GFR calc non Af Amer: >60 mL/min (ref >60)
Glucose, Bld: 120 mg/dL — ABNORMAL HIGH (ref 70–99)
Potassium: 4 mmol/L (ref 3.5–5.1)
Sodium: 135 mmol/L (ref 135–145)

## 2020-05-31 LAB — CBC
HCT: 27 % — ABNORMAL LOW (ref 39.0–52.0)
Hemoglobin: 8.7 g/dL — ABNORMAL LOW (ref 13.0–17.0)
MCH: 30.6 pg (ref 26.0–34.0)
MCHC: 32.2 g/dL (ref 30.0–36.0)
MCV: 95.1 fL (ref 80.0–100.0)
Platelets: 133 10*3/uL — ABNORMAL LOW (ref 150–400)
RBC: 2.84 MIL/uL — ABNORMAL LOW (ref 4.22–5.81)
RDW: 15 % (ref 11.5–15.5)
WBC: 13.2 10*3/uL — ABNORMAL HIGH (ref 4.0–10.5)
nRBC: 0.2 % (ref 0.0–0.2)

## 2020-05-31 LAB — GLUCOSE, CAPILLARY
Glucose-Capillary: 116 mg/dL — ABNORMAL HIGH (ref 70–99)
Glucose-Capillary: 103 mg/dL — ABNORMAL HIGH (ref 70–99)
Glucose-Capillary: 97 mg/dL (ref 70–99)
Glucose-Capillary: 99 mg/dL (ref 70–99)

## 2020-05-31 MED ORDER — LISINOPRIL 5 MG PO TABS
5.0000 mg | ORAL_TABLET | Freq: Every day | ORAL | Status: DC
  Administered 2020-05-31 – 2020-06-02 (×3): 5 mg via ORAL
  Filled 2020-05-31 (×3): qty 1

## 2020-05-31 NOTE — Progress Notes (Signed)
Left arm incision oozing. Painted with betadine and placed gauze over incision

## 2020-05-31 NOTE — Progress Notes (Signed)
CARDIAC REHAB PHASE I  [phone]  Pt had just ambulated 70 ft with nursing staff. Pt educated on IS usage, risk factors, sternal precautions, restrictions, exercise, and nutrition ([place]). Pt referred to CRP2 at [facility].   Philis Kendall, MS, ACSM CEP 05/31/2020 8:26 AM

## 2020-05-31 NOTE — Plan of Care (Signed)
  Problem: Health Behavior/Discharge Planning: Goal: Ability to manage health-related needs will improve Outcome: Progressing   Problem: Clinical Measurements: Goal: Will remain free from infection Outcome: Progressing   

## 2020-05-31 NOTE — Progress Notes (Addendum)
[address]       [place] [address]             [phone]      4 Days Post-Op Procedure(s) (LRB): REDO CORONARY ARTERY BYPASS GRAFTING (CABG), ON PUMP, TIMES TWO, USING RIGHT INTERNAL MAMMARY ARTERY AND LEFT RADIAL ARTERY (OPEN) (N/A) RADIAL ARTERY HARVEST (Left) TRANSESOPHAGEAL ECHOCARDIOGRAM (TEE) (N/A) MEDIAN STERNOTOMY (N/A) Subjective: Feels okay this morning. He is asking about his low-grade fever. He plans to use his incentive spirometer.   Objective: Vital signs in last 24 hours: Temp:  [98.4 F (36.9 C)-100.9 F (38.3 C)] 98.5 F (36.9 C) (07/17 0420) Pulse Rate:  [72-112] 75 (07/17 0420) Cardiac Rhythm: Normal sinus rhythm (07/17 0420) Resp:  [5-22] 15 (07/17 0420) BP: (137-159)/(69-95) 152/72 (07/17 0420) SpO2:  [96 %-99 %] 98 % (07/17 0420) Weight:  [110.9 kg] 110.9 kg (07/17 0420)     Intake/Output from previous day: 07/16 0701 - 07/17 0700 In: 840 [P.O.:840] Out: 126 [Urine:125; Stool:1] Intake/Output this shift: No intake/output data recorded.  General appearance: alert, cooperative and no distress Heart: regular rate and rhythm, S1, S2 normal, no murmur, click, rub or gallop Lungs: clear to auscultation bilaterally and diminished in the lower lobes Abdomen: soft, non-tender; bowel sounds normal; no masses,  no organomegaly Extremities: + upper and lower ext edema Wound: clean and dry radial harvest site and sternotomy incision  Lab Results: Recent Labs    05/29/20 0311 05/30/20 0431  WBC 12.9* 12.7*  HGB 7.9* 7.7*  HCT 23.0* 23.6*  PLT 67* 68*   BMET:  Recent Labs    05/29/20 1520 05/30/20 0431  NA 137 137  K 3.9 4.7  CL 104 105  CO2 26 24  GLUCOSE 116* 88  BUN 12 13  CREATININE 1.07 1.02  CALCIUM 7.9* 7.9*    PT/INR: No results for input(s): LABPROT, INR in the last 72 hours. ABG    Component Value Date/Time   PHART 7.357 05/28/2020 0339   HCO3 22.8 05/28/2020 0339   TCO2 24 05/28/2020 0339    ACIDBASEDEF 3.0 (H) 05/28/2020 0339   O2SAT 99.0 05/28/2020 0339   CBG (last 3)  Recent Labs    05/30/20 1611 05/30/20 2140 05/31/20 0623  GLUCAP 104* 96 116*    Assessment/Plan: S/P Procedure(s) (LRB): REDO CORONARY ARTERY BYPASS GRAFTING (CABG), ON PUMP, TIMES TWO, USING RIGHT INTERNAL MAMMARY ARTERY AND LEFT RADIAL ARTERY (OPEN) (N/A) RADIAL ARTERY HARVEST (Left) TRANSESOPHAGEAL ECHOCARDIOGRAM (TEE) (N/A) MEDIAN STERNOTOMY (N/A)  POD 4 re-do CABG x 2 with right IMA and left radial artery.  CV- Normal pre-op LV function, moderate LV hypertrophy.  Continue ASA81/Plavix. Continue statin. HTN, he was on lisinopril 20mg  at home. Will start at 5mg  daily and titrate. Creatinine 1.02. Continue metoprolol 25mg  BID.  Pulm-tolerating room air with good oxygen saturation. CXR stable with tiny bilateral pleural effusions.  Renal- Volume Excess- Pre-op Wt not recorded but Pt says he weighed 229lbs (104kg) prior to surgery. Good response to diuresis, Wt now ~5kg+. Continue oral Lasix. Electrolytes okay.   H and H-Expected acute blood loss anemia- Transfusce 1 unit PRBC's POD-1. Hct 23.6% and stable.  Monitor. Thrombocytopenia, platelets 68k. HIT panel within normal range.  Endo- no prior h/o DM, glucose well controlled. Continue to monitor and provide SSI.  Plan: Will hold off on removing EPW until tomorrow. Continue to diuresis. May need an extra dose of lasix today. Added lisinopril for better BP control. Continue to ambulate.  Low-grade fever (99.5) likely due to atelectasis. Encouraged incentive spirometer.      LOS: 4 days    Elgie Collard 05/31/2020   I have seen and examined the patient and agree with the assessment and plan as outlined.  Making good progress but still needs diuresis.  Rexene Alberts, MD 05/31/2020 1:45 PM

## 2020-05-31 NOTE — Progress Notes (Signed)
Mobility Specialist - Progress Note   05/31/20 1150  Mobility  Activity Ambulated in hall  Level of Assistance Modified independent, requires aide device or extra time  Assistive Device Front wheel walker  Distance Ambulated (ft) 500 ft  Mobility Response Tolerated well  Mobility performed by Mobility specialist  $Mobility charge 1 Mobility    Pre-mobility: 81 HR, 96% SpO2 During mobility: 103 HR, 98% SpO2 Post-mobility: 100 HR, 100% SpO2  Pt was breathing hard during ambulation, he required one standing rest break at 250 ft. There was some mild leakage from his L forearm incision, RN was notified.   Pricilla Handler Mobility Specialist Mobility Specialist Phone: [phone]

## 2020-05-31 NOTE — Progress Notes (Signed)
Pt ambulated x 470 feet around the unit with front wheel walker HR increased to 110's-120's while ambulating but quickly recovered at rest pt tolerated well

## 2020-06-01 LAB — GLUCOSE, CAPILLARY: Glucose-Capillary: 97 mg/dL (ref 70–99)

## 2020-06-01 NOTE — Plan of Care (Signed)
  Problem: Health Behavior/Discharge Planning: Goal: Ability to manage health-related needs will improve Outcome: Progressing   Problem: Clinical Measurements: Goal: Will remain free from infection Outcome: Progressing   Problem: Activity: Goal: Risk for activity intolerance will decrease Outcome: Progressing   Problem: Nutrition: Goal: Adequate nutrition will be maintained Outcome: Progressing   

## 2020-06-01 NOTE — Progress Notes (Signed)
Pt ambulated  x 470 feet around the unit tolerated well

## 2020-06-01 NOTE — Progress Notes (Signed)
Pt ambulated x 470 feet in hall with front wheel walker, hr increases to 110's-120's when ambulating but quickly recovers

## 2020-06-01 NOTE — Progress Notes (Signed)
EPW pulled per order. Tips in tact. Patient educated on bedrest for one hour. Patient understood and will call if he has any problems. Will continue to monitor

## 2020-06-01 NOTE — Progress Notes (Addendum)
[address]       [place] [address]             [phone]      5 Days Post-Op Procedure(s) (LRB): REDO CORONARY ARTERY BYPASS GRAFTING (CABG), ON PUMP, TIMES TWO, USING RIGHT INTERNAL MAMMARY ARTERY AND LEFT RADIAL ARTERY (OPEN) (N/A) RADIAL ARTERY HARVEST (Left) TRANSESOPHAGEAL ECHOCARDIOGRAM (TEE) (N/A) MEDIAN STERNOTOMY (N/A) Subjective: Asking a few questions this morning, one being if he should travel in 2 months with a friend who is unvaccinated.   Objective: Vital signs in last 24 hours: Temp:  [98.6 F (37 C)-99.5 F (37.5 C)] 99 F (37.2 C) (07/18 0751) Pulse Rate:  [74-91] 91 (07/18 0751) Cardiac Rhythm: Sinus tachycardia (07/18 0838) Resp:  [13-20] 18 (07/18 0751) BP: (122-151)/(65-74) 136/69 (07/18 0751) SpO2:  [97 %-100 %] 100 % (07/18 0751) Weight:  [110.2 kg] 110.2 kg (07/18 0455)     Intake/Output from previous day: 07/17 0701 - 07/18 0700 In: 600 [P.O.:600] Out: -  Intake/Output this shift: No intake/output data recorded.  General appearance: alert, cooperative and no distress Heart: regular rate and rhythm, S1, S2 normal, no murmur, click, rub or gallop Lungs: clear to auscultation bilaterally Abdomen: soft, non-tender; bowel sounds normal; no masses,  no organomegaly Extremities: left leg edema (chronic) left radial site 1-2+ edema with some drainage Wound: radial site with some clear drainage  Lab Results: Recent Labs    05/30/20 0431 05/31/20 1257  WBC 12.7* 13.2*  HGB 7.7* 8.7*  HCT 23.6* 27.0*  PLT 68* 133*   BMET:  Recent Labs    05/30/20 0431 05/31/20 0944  NA 137 135  K 4.7 4.0  CL 105 102  CO2 24 23  GLUCOSE 88 120*  BUN 13 15  CREATININE 1.02 1.05  CALCIUM 7.9* 8.4*    PT/INR: No results for input(s): LABPROT, INR in the last 72 hours. ABG    Component Value Date/Time   PHART 7.357 05/28/2020 0339   HCO3 22.8 05/28/2020 0339   TCO2 24 05/28/2020 0339   ACIDBASEDEF 3.0 (H) 05/28/2020 0339    O2SAT 99.0 05/28/2020 0339   CBG (last 3)  Recent Labs    05/31/20 1643 05/31/20 2131 06/01/20 0617  GLUCAP 97 99 97    Assessment/Plan: S/P Procedure(s) (LRB): REDO CORONARY ARTERY BYPASS GRAFTING (CABG), ON PUMP, TIMES TWO, USING RIGHT INTERNAL MAMMARY ARTERY AND LEFT RADIAL ARTERY (OPEN) (N/A) RADIAL ARTERY HARVEST (Left) TRANSESOPHAGEAL ECHOCARDIOGRAM (TEE) (N/A) MEDIAN STERNOTOMY (N/A)  POD5re-do CABG x 2 with right IMA and left radial artery.  1. CV- Normal pre-op LV function, moderate LV hypertrophy.  Continue ASA81/Plavix. Continuestatin.BP better controlled, continue 5mg  daily and titrate. Creatinine 1.02. Continue metoprolol 25mg  BID. 2. Pulm-tolerating room air with good oxygen saturation. CXR stable with tiny bilateral pleural effusions. 3. Renal- Volume Excess- Pre-op Wt not recorded but Pt says he weighed 229lbs (104kg) prior to surgery.Good response to diuresis, no weight today in the chart. Last recorded was 110kg. Continue oral Lasix. Electrolytes okay.  4. H and H-Expected acute blood loss anemia- 8.7/27.0 stable.Monitor. Thrombocytopenia, platelets 133k-improved. HIT panel within normal range. 5. Endo- no prior h/o DM, glucosewell controlled. Discontinue glucose monitoring  Plan: Continue diuretic regimen. Will discuss removal of EPW today while the patient is on plavix. Platlets improved. Encouraged him to not submerge himself in a pool for at least 3 months (he is going on vacation in 43months). He can put his feet in the water. Also,  encouraged his friend to get vaccinated since his immune system will be recovering from surgery.    LOS: 5 days    Elgie Collard 06/01/2020  I have seen and examined the patient and agree with the assessment and plan as outlined.  Rexene Alberts, MD 06/01/2020 12:56 PM

## 2020-06-01 NOTE — Progress Notes (Signed)
Mobility Specialist - Progress Note   06/01/20 1443  Mobility  Activity  (Cancel)  Mobility performed by Mobility specialist    Unable to see pt today as pt is on bedrest per RN.    Pricilla Handler Mobility Specialist Mobility Specialist Phone: [phone]

## 2020-06-02 MED ORDER — LISINOPRIL 10 MG PO TABS: 10.0000 mg | ORAL_TABLET | Freq: Every day | ORAL | 2 refills | Status: DC

## 2020-06-02 MED ORDER — ACETAMINOPHEN 325 MG PO TABS
650.0000 mg | ORAL_TABLET | Freq: Once | ORAL | Status: AC
  Administered 2020-06-02: 650 mg via ORAL
  Filled 2020-06-02: qty 2

## 2020-06-02 MED ORDER — ASPIRIN 81 MG PO TBEC: 81.0000 mg | DELAYED_RELEASE_TABLET | Freq: Every day | ORAL | 11 refills | Status: DC

## 2020-06-02 MED ORDER — FUROSEMIDE 40 MG PO TABS: 40.0000 mg | ORAL_TABLET | Freq: Every day | ORAL | 0 refills | Status: DC

## 2020-06-02 MED ORDER — TRAMADOL HCL 50 MG PO TABS: 50.0000 mg | ORAL_TABLET | Freq: Four times a day (QID) | ORAL | 0 refills | Status: AC | PRN

## 2020-06-02 MED ORDER — POTASSIUM CHLORIDE CRYS ER 20 MEQ PO TBCR: 20.0000 meq | EXTENDED_RELEASE_TABLET | Freq: Every day | ORAL | 0 refills | Status: DC

## 2020-06-02 NOTE — Progress Notes (Signed)
Physical Therapy Treatment Patient Details Name: Dave Robles MRN: [id number] DOB: [birth year] Today's Date: 06/02/2020    History of Present Illness Pt is 74 yo male who was admitted to the hospital redo of CABG due to 3-vessel coronary artery disease with total occlusion of the right coronary artery and LAD.  Pt is now s/p redo CABG x 2 using R internal mammary artery and L radial artery on 05/27/20. Pt with PMH including CABG in 1991, AAA, arthritis, HTN, and MI.    PT Comments    Pt very pleasant sitting in chair on arrival and states he is moving well. Pt able to state precautions and reports he normally bikes 15-20 miles 3x/week. Pt moving very well able to complete all transfers, gait and stairs without assist. All education complete and pt safe for return home. Will sign off with pt aware and agreeable.  HR 91-122 SPO2 100% RA    Follow Up Recommendations  No PT follow up     Equipment Recommendations  None recommended by PT    Recommendations for Other Services       Precautions / Restrictions Precautions Precautions: Sternal    Mobility  Bed Mobility Overal bed mobility: Needs Assistance Bed Mobility: Supine to Sit;Sit to Supine     Supine to sit: Supervision Sit to supine: Supervision   General bed mobility comments: supervision for cues with pt able to perform without assist and maintaining precautions  Transfers Overall transfer level: Modified independent               General transfer comment: pt able to stand from bed and chair without assist  Ambulation/Gait Ambulation/Gait assistance: Supervision Gait Distance (Feet): 500 Feet Assistive device: None Gait Pattern/deviations: Step-through pattern;Decreased stride length   Gait velocity interpretation: >4.37 ft/sec, indicative of normal walking speed General Gait Details: pt with slow steady gait with preference to be near wall with gait.   Stairs Stairs: Yes Stairs assistance:  Supervision Stair Management: One rail Right;Alternating pattern;Forwards Number of Stairs: 5 General stair comments: cues for proximity to arm and rail without physical assist   Wheelchair Mobility    Modified Rankin (Stroke Patients Only)       Balance Overall balance assessment: No apparent balance deficits (not formally assessed)                                          Cognition Arousal/Alertness: Awake/alert Behavior During Therapy: WFL for tasks assessed/performed Overall Cognitive Status: Within Functional Limits for tasks assessed                                        Exercises      General Comments        Pertinent Vitals/Pain Pain Assessment: No/denies pain    Home Living                      Prior Function            PT Goals (current goals can now be found in the care plan section) Progress towards PT goals: Goals met/education completed, patient discharged from PT    Frequency           PT Plan Current plan remains appropriate    Co-evaluation  AM-PAC PT "6 Clicks" Mobility   Outcome Measure  Help needed turning from your back to your side while in a flat bed without using bedrails?: None Help needed moving from lying on your back to sitting on the side of a flat bed without using bedrails?: None Help needed moving to and from a bed to a chair (including a wheelchair)?: None Help needed standing up from a chair using your arms (e.g., wheelchair or bedside chair)?: None Help needed to walk in hospital room?: A Little Help needed climbing 3-5 steps with a railing? : A Little 6 Click Score: 22    End of Session   Activity Tolerance: Patient tolerated treatment well Patient left: in chair;with family/visitor present;with call bell/phone within reach Nurse Communication: Mobility status PT Visit Diagnosis: Other abnormalities of gait and mobility (R26.89);Muscle weakness  (generalized) (M62.81)     Time: 1191-4782 PT Time Calculation (min) (ACUTE ONLY): 22 min  Charges:  $Gait Training: 8-22 mins                     Bayard Males, PT Acute Rehabilitation Services Pager: [phone] Office: [place] 06/02/2020, 12:05 PM

## 2020-06-02 NOTE — Progress Notes (Addendum)
      [address]       [place] [address]             [phone]        6 Days Post-Op  Procedure(s) (LRB): REDO CORONARY ARTERY BYPASS GRAFTING (CABG), ON PUMP, TIMES TWO, USING RIGHT INTERNAL MAMMARY ARTERY AND LEFT RADIAL ARTERY (OPEN) (N/A) RADIAL ARTERY HARVEST (Left) TRANSESOPHAGEAL ECHOCARDIOGRAM (TEE) (N/A) MEDIAN STERNOTOMY (N/A) Subjective: Awake and alert, up in the bedside chair.  Ambulating independently and tolerating diet. He has been on RA for several days. Appropriate bowel function.    Objective: Vital signs in last 24 hours: Temp:  [97.8 F (36.6 C)-99.3 F (37.4 C)] 98.5 F (36.9 C) (07/19 0340) Pulse Rate:  [60-90] 80 (07/19 0340) Cardiac Rhythm: Normal sinus rhythm (07/18 2100) Resp:  [16-20] 20 (07/19 0340) BP: (130-138)/(63-77) 138/66 (07/19 0340) SpO2:  [97 %-98 %] 98 % (07/19 0340) Weight:  [110.1 kg] 110.1 kg (07/19 0340)    Intake/Output from previous day: 07/18 0701 - 07/19 0700 In: 480 [P.O.:480] Out: -  Intake/Output this shift: No intake/output data recorded.  General appearance: alert, cooperative and no distress Neurologic: intact Heart: SR, no significant arrhythmias. Lungs: breath sounds clear. Abdomen: Soft, NT,  Extremities: Mild LE edema.  Wound: dry and intact. Leaving the CT sutures in place for now.,  Lab Results: Recent Labs    05/31/20 1257  WBC 13.2*  HGB 8.7*  HCT 27.0*  PLT 133*   BMET:  Recent Labs    05/31/20 0944  NA 135  K 4.0  CL 102  CO2 23  GLUCOSE 120*  BUN 15  CREATININE 1.05  CALCIUM 8.4*    PT/INR: No results for input(s): LABPROT, INR in the last 72 hours. ABG    Component Value Date/Time   PHART 7.357 05/28/2020 0339   HCO3 22.8 05/28/2020 0339   TCO2 24 05/28/2020 0339   ACIDBASEDEF 3.0 (H) 05/28/2020 0339   O2SAT 99.0 05/28/2020 0339   CBG (last 3)  Recent Labs    05/31/20 1643 05/31/20 2131 06/01/20 0617  GLUCAP 97 99 97    Assessment/Plan: S/P  Procedure(s) (LRB): REDO CORONARY ARTERY BYPASS GRAFTING (CABG), ON PUMP, TIMES TWO, USING RIGHT INTERNAL MAMMARY ARTERY AND LEFT RADIAL ARTERY (OPEN) (N/A) RADIAL ARTERY HARVEST (Left) TRANSESOPHAGEAL ECHOCARDIOGRAM (TEE) (N/A) MEDIAN STERNOTOMY (N/A)  -POD 6 re-do CABG x 2 with right IMA and left radial artery. Normal pre-op LV function, moderate LV hypertrophy. Progressing as expected.  Continue ASA at 81mg  and Plavix. Continue statin and Imdur.   -Volume Excess-Still has some peripheral edema, no wt this am. Continue oral Lasix daily for 5 days post discharge. .  -History of HTN- continue metoprolol to 25mg  PO BID and lisinopril.  -Expected acute blood loss anemia- Hct stable.   -Hypokalemia- corrected. Continue supplements while on Lasix.  -Thrombocytopenia- Plt count improved, HIT negative.   -Disposition- Plan discharge to home today.    LOS: 6 days    Dave Robles [phone] 06/02/2020  Patient feels well today and wants to go home, plan d/c today I have seen and examined Dave Robles and agree with the above assessment  and plan.  Grace Isaac MD [facility] [phone] Office [phone] 06/02/2020 12:51 PM

## 2020-06-02 NOTE — Progress Notes (Signed)
CARDIAC REHAB PHASE I   Pt seen walking independently in hallway with steady gait. D/c education reinforced with pt. Reviewed site care, restrictions, and sternal precautions. Encouraged continued IS use and walks. Pt referred to [facility] [place]. Pt is interested in participating in Virtual Cardiac and Pulmonary Rehab. Pt advised that Virtual Cardiac and Pulmonary Rehab is provided at no cost to the patient.  Checklist:  1. Pt has smart device  ie smartphone and/or ipad for downloading an app  Yes 2. Reliable internet/wifi service    Yes 3. Understands how to use their smartphone and navigate within an app.  Yes  Pt verbalized understanding and is in agreement.  [id number] Rufina Falco, RN BSN 06/02/2020 8:51 AM

## 2020-06-02 NOTE — Care Management Important Message (Signed)
Important Message  Patient Details  Name: KEYTON BHAT MRN: [id number] Date of Birth: Jun 03, 1946   Medicare Important Message Given:  Yes     Shelda Altes 06/02/2020, 11:02 AM

## 2020-06-02 NOTE — Progress Notes (Signed)
PT Cancellation Note  Patient Details Name: Dave Robles MRN: [id number] DOB: January 07, 1946   Cancelled Treatment:    Reason Eval/Treat Not Completed: Other (comment) (pt just walked with cardiac rehab. will allow rest prior to return)   Dave Robles 06/02/2020, 8:57 AM [place] Pager: [phone] Office: [phone]

## 2020-06-03 NOTE — Progress Notes (Signed)
CARDIOLOGY OFFICE NOTE  Date:  06/10/2020    Dave Robles Date of Birth: 1946-02-03 Medical Record #[id number]  PCP:  Dorothyann Peng, NP  Cardiologist:  Servando Snare    Chief Complaint  Patient presents with  . Hospitalization Follow-up    History of Present Illness: Dave Robles is a 74 y.o. male who presents today for a post hospital visit. He is a former patient of Dr. Claris Gladden who follows with me.   He has a known history of CAD with original CABG x 3 with LIMA to LAD, SVG to DX and SVG to distal RCA in 1991 per Dr. Redmond Pulling. Other issues include HTN, HLD, ED, pasttobacco abuse, asthma, AAA (last measured in 10/2019 - 3.4 cm)and depression.He had a non-ST elevationmyocardial infarction in 2016 while vacationing in [place].Cathnoted to have NL left main, 90% prox LAD, 99% mid LAD with patent LIMA to mid LAD and patent SVG to DX. LCX origin stenosis vs flow void? Fistula vs competitive flow in branch?, 100% RCA, SVG to RCA 99% origin treated with Xience DES and PTCA to the distal.  I have followed him for many years. Seen as a work in back in June - noting more DOE which is his anginal equivalent - referred on for cardiac cath that led to consultation for repeat CABG. The patient and his studies were evaluated by Dr. Servando Snare who felt the patient was a candidate for redo CABG.  On 05/27/2020 he was taken to the operating room where he underwent redo coronary artery bypass grafting x2.  A left radial artery was placed to the PDA, and a free right internal mammary artery was placed to the obtuse marginal.  Post op course was eventful for needing inotropic support initially. He did have acute blood loss anemia and required transfusion. Some drop in platelets but HIT negative.   Comes in today. Here alone. Has been home a week. Doing pretty well - bored. Back is sore. No pain medicine. Actually able to get in the bed and sleep. Lasix has been extended. He is a little short of  breath with activity. He is anemic. He is walking in the house - avoiding the high humidity. Little lethargic at times. Not sleeping well. But overall seems to be doing ok for being home a week. Had his sutures out yesterday at [facility].   Past Medical History:  Diagnosis Date  . AAA (abdominal aortic aneurysm) ([facility])   . AAA (abdominal aortic aneurysm) without rupture ([place]) 01/19/2017  . Adjustment disorder with depressed mood 03/04/2009  . Arthritis   . Colon polyps   . CORONARY ARTERY DISEASE 07/10/2007  . Coronary artery disease involving native coronary artery of native heart without angina pectoris 07/10/2007   Qualifier: Diagnosis of  By: Scherrie Gerlach    . Depression   . DOE (dyspnea on exertion)   . Episodic mood disorder ([place]) 08/01/2014  . ERECTILE DYSFUNCTION 07/10/2007  . Extrinsic asthma, unspecified 09/10/2008  . HYPERLIPIDEMIA 07/10/2007  . HYPERTENSION 07/10/2007  . Myocardial infarction ([facility]) 2016   when had angioplasty- was told had a silent MI  . OA (osteoarthritis) of hip 04/19/2018  . Onychomycosis 05/14/2014  . Pain in joint of left knee 01/19/2017  . Personal history of colonic polyps 07/08/2014   1997, 2001 8 mm transverse serrated polyp right colon 2010 07/08/2014 no polyps - repeat colon 2022     . S/P CABG x 2 05/27/2020  . TOBACCO USE 12/15/2009  Past Surgical History:  Procedure Laterality Date  . COLONOSCOPY    . CORONARY ANGIOPLASTY  12/2014  . CORONARY ARTERY BYPASS GRAFT  11/15/89   5 vessels  . CORONARY ARTERY BYPASS GRAFT N/A 05/27/2020   Procedure: REDO CORONARY ARTERY BYPASS GRAFTING (CABG), ON PUMP, TIMES TWO, USING RIGHT INTERNAL MAMMARY ARTERY AND LEFT RADIAL ARTERY (OPEN);  Surgeon: Grace Isaac, MD;  Location: [facility];  Service: Open Heart Surgery;  Laterality: N/A;  Left Radial Artery to PDA FREE RIMA to OM  . HYDROCELE EXCISION / REPAIR  2011  . INGUINAL HERNIA REPAIR Left 1985  . LEFT HEART CATH AND CORS/GRAFTS ANGIOGRAPHY N/A 05/13/2020    Procedure: LEFT HEART CATH AND CORS/GRAFTS ANGIOGRAPHY;  Surgeon: Belva Crome, MD;  Location: [facility];  Service: Cardiovascular;  Laterality: N/A;  . MEDIASTERNOTOMY N/A 05/27/2020   Procedure: MEDIAN STERNOTOMY;  Surgeon: Grace Isaac, MD;  Location: [facility];  Service: Open Heart Surgery;  Laterality: N/A;  Redo Median Sternotomy  . RADIAL ARTERY HARVEST Left 05/27/2020   Procedure: RADIAL ARTERY HARVEST;  Surgeon: Grace Isaac, MD;  Location: [facility];  Service: Open Heart Surgery;  Laterality: Left;  . TEE WITHOUT CARDIOVERSION N/A 05/27/2020   Procedure: TRANSESOPHAGEAL ECHOCARDIOGRAM (TEE);  Surgeon: Grace Isaac, MD;  Location: [facility];  Service: Open Heart Surgery;  Laterality: N/A;  . TOTAL HIP ARTHROPLASTY Right 04/19/2018   Procedure: RIGHT TOTAL HIP ARTHROPLASTY ANTERIOR APPROACH;  Surgeon: Gaynelle Arabian, MD;  Location: [facility];  Service: Orthopedics;  Laterality: Right;     Medications: Current Meds  Medication Sig  . acetaminophen (TYLENOL) 325 MG tablet Take 650 mg by mouth every 6 (six) hours as needed for moderate pain or headache.   Marland Kitchen aspirin EC 81 MG EC tablet Take 1 tablet (81 mg total) by mouth daily. Swallow whole.  Marland Kitchen atorvastatin (LIPITOR) 80 MG tablet TAKE 1 TABLET DAILY  . buPROPion (WELLBUTRIN XL) 300 MG 24 hr tablet Take 1 tablet (300 mg total) by mouth daily.  . calcipotriene (DOVONOX) 0.005 % cream Apply 1 application topically 2 (two) times daily.  . clobetasol cream (TEMOVATE) 2.33 % Apply 1 application topically daily.  . clopidogrel (PLAVIX) 75 MG tablet Take 1 tablet (75 mg total) by mouth daily.  . furosemide (LASIX) 40 MG tablet Take 1 tablet (40 mg total) by mouth daily.  . furosemide (LASIX) 40 MG tablet Take 40 mg by mouth daily.  . isosorbide mononitrate (IMDUR) 30 MG 24 hr tablet Take 1 tablet (30 mg total) by mouth daily.  Marland Kitchen lisinopril (ZESTRIL) 10 MG tablet Take 1 tablet (10 mg total) by mouth daily.  . Melatonin 10 MG TABS Take  20 mg by mouth at bedtime.  . metoprolol tartrate (LOPRESSOR) 25 MG tablet Take 1 tablet (25 mg total) by mouth 2 (two) times daily.  . Multiple Vitamin (MULTIVITAMIN) capsule Take 1 capsule by mouth daily.    . nitroGLYCERIN (NITROSTAT) 0.4 MG SL tablet Place 1 tablet (0.4 mg total) under the tongue every 5 (five) minutes as needed.  . potassium chloride SA (KLOR-CON) 20 MEQ tablet Take 1 tablet (20 mEq total) by mouth daily.  . potassium chloride SA (KLOR-CON) 20 MEQ tablet Take 20 mEq by mouth daily.  . sildenafil (VIAGRA) 100 MG tablet Take 100 mg by mouth daily as needed for erectile dysfunction.     Allergies: No Known Allergies  Social History: The patient  reports that he quit smoking about 30  years ago. He smoked 0.50 packs per day. He has never used smokeless tobacco. He reports current alcohol use of about 1.0 standard drink of alcohol per week. He reports that he does not use drugs.   Family History: The patient's family history includes Cancer in his mother; Coronary artery disease in his father; Depression in his mother and paternal grandfather; Hyperlipidemia in an other family member; Hypertension in an other family member.   Review of Systems: Please see the history of present illness.   All other systems are reviewed and negative.   Physical Exam: VS:  BP 110/70   Pulse 74   Wt (!) 239 lb 3.2 oz (108.5 kg)   SpO2 96%   BMI 34.32 kg/m  .  BMI Body mass index is 34.32 kg/m.  Wt Readings from Last 3 Encounters:  06/10/20 (!) 239 lb 3.2 oz (108.5 kg)  06/02/20 242 lb 11.6 oz (110.1 kg)  05/23/20 228 lb 8 oz (103.6 kg)    General: Alert and in no acute distress. Color is a little sallow.    Cardiac: Regular rate and rhythm. No murmurs, rubs, or gallops. He has 1+ edema - worse in the left leg. Sternum looks ok - some delayed healing but stable. Radial harvesting from the left arm looks good.  Respiratory:  Lungs are clear - little decreased in the bases.   GI:  Soft and nontender.  MS: No deformity or atrophy. Gait and ROM intact.  Skin: Warm and dry. Color is normal.  Neuro:  Strength and sensation are intact and no gross focal deficits noted.  Psych: Alert, appropriate and with normal affect.   LABORATORY DATA:  EKG:  EKG is ordered today.  Personally reviewed by me. This demonstrates NSR with inferior Q's and diffuse anterolateral T wave changes. HR is 74.   Lab Results  Component Value Date   WBC 13.2 (H) 05/31/2020   HGB 8.7 (L) 05/31/2020   HCT 27.0 (L) 05/31/2020   PLT 133 (L) 05/31/2020   GLUCOSE 120 (H) 05/31/2020   CHOL 119 01/10/2020   TRIG 90.0 01/10/2020   HDL 28.90 (L) 01/10/2020   LDLDIRECT 66.7 11/30/2011   LDLCALC 72 01/10/2020   ALT 38 05/23/2020   AST 26 05/23/2020   NA 135 05/31/2020   K 4.0 05/31/2020   CL 102 05/31/2020   CREATININE 1.05 05/31/2020   BUN 15 05/31/2020   CO2 23 05/31/2020   TSH 2.57 01/10/2020   PSA 1.30 01/10/2020   INR 1.4 (H) 05/28/2020   HGBA1C 5.6 05/23/2020     BNP (last 3 results) No results for input(s): BNP in the last 8760 hours.  ProBNP (last 3 results) No results for input(s): PROBNP in the last 8760 hours.   Other Studies Reviewed Today:  OPERATIVE REPORT  DATE OF PROCEDURE: 05/27/2020  NAME OF PROCEDURE: Redo coronary artery bypass grafting with free right internal mammary artery to the obtuse marginal and left radial graft to the posterior descending coronary artery with open left radial harvesting  SURGEON: Lanelle Bal, MD  FIRST ASSISTANT: Jadene Pierini, PA  Assessment/Plan:  1. Redo CABG with free RIMA to OM and Left radial to PD. Slow progress but stable. Still with some volume overload. But no other worrisome issues. Will recheck his lab today. Agree with continuing Lasix for now. Nitrates for one month and then most likely stop. Will see back in about 4 weeks.   2. HTN - BP is fine on his current regimen.  3. HLD - on statin therapy.   4.  Post op blood loss anemia - rechecking lab today.   5. AAA - last studied in December and was stable.   6. Prior LV dysfunction - normal EF by echo from 2016 - he is on ACE and beta blocker. See #1.   7. Prior bradycardia - HR is ok today.   Current medicines are reviewed with the patient today.  The patient does not have concerns regarding medicines other than what has been noted above.  The following changes have been made:  See above.  Labs/ tests ordered today include:    Orders Placed This Encounter  Procedures  . Basic metabolic panel  . CBC  . EKG 12-Lead     Disposition:   FU with me in 4 weeks.   Patient is agreeable to this plan and will call if any problems develop in the interim.   SignedTruitt Merle, NP  06/10/2020 4:21 PM  [facility] [address] [place], [place]  [address] Phone: [phone] Fax: [phone]

## 2020-06-05 ENCOUNTER — Telehealth (HOSPITAL_COMMUNITY): Payer: Self-pay

## 2020-06-05 NOTE — Telephone Encounter (Signed)
Attempted to call patient in regards to Cardiac Rehab - LM on VM 

## 2020-06-05 NOTE — Telephone Encounter (Signed)
Pt insurance is active and benefits verified through [facility]. Co-pay $45.00, DED $0.00/$0.00 met, out of pocket $5,000.00/$738.52 met, co-insurance 0%. No pre-authorization required. [employer], 06/05/20 '@319PM' , TVV#[id number]  Will contact patient to see if he is interested in the Cardiac Rehab Program. If interested, patient will need to complete follow up appt. Once completed, patient will be contacted for scheduling upon review by the RN Navigator.

## 2020-06-09 ENCOUNTER — Other Ambulatory Visit: Payer: Self-pay

## 2020-06-09 ENCOUNTER — Ambulatory Visit (INDEPENDENT_AMBULATORY_CARE_PROVIDER_SITE_OTHER): Payer: Self-pay

## 2020-06-09 DIAGNOSIS — Z4802 Encounter for removal of sutures: Secondary | ICD-10-CM

## 2020-06-09 MED ORDER — FUROSEMIDE 40 MG PO TABS: 40.0000 mg | ORAL_TABLET | Freq: Every day | ORAL | 1 refills | Status: DC

## 2020-06-09 MED ORDER — POTASSIUM CHLORIDE CRYS ER 20 MEQ PO TBCR: 20.0000 meq | EXTENDED_RELEASE_TABLET | Freq: Every day | ORAL | 1 refills | Status: DC

## 2020-06-09 NOTE — Progress Notes (Signed)
Left voicemail message for patient and wife about refill of Lasix/ potassium per Dr. Servando Snare.  Advised to continue to take medication until otherwise directed.  Will await return call if warranted.

## 2020-06-09 NOTE — Progress Notes (Signed)
Patient arrived for nurse visit to remove sutures post- procedure Redo CABG x2 05/27/20 with Dr. Servando Snare.  3 sutures removed with no signs/ symptoms of infection noted.  Incision sites well approximated with no dehiscence when sutures removed.  Patient tolerated procedure well.  Patient instructed to keep the incision sites clean and dry.  Advised that he could stop using Betadine on and around wounds and use the incentive spirometer when not up and active.  Stated he is walking and drink/ eat well.  No shortness of breath or coughing.  He did state that he has gained 5 pounds in 2 days and did observe bilateral LE swelling 2+ edema, out of lasix.  I did advised patient that I would make Dr. Servando Snare aware and possibly call in a refill of medication. Patient acknowledged instructions given and is aware of his follow-up appointment.

## 2020-06-10 ENCOUNTER — Ambulatory Visit: Payer: Medicare HMO | Admitting: Nurse Practitioner

## 2020-06-10 ENCOUNTER — Encounter: Payer: Self-pay | Admitting: Nurse Practitioner

## 2020-06-10 VITALS — BP 110/70 | HR 74 | Wt 239.2 lb

## 2020-06-10 DIAGNOSIS — E7800 Pure hypercholesterolemia, unspecified: Secondary | ICD-10-CM

## 2020-06-10 DIAGNOSIS — I10 Essential (primary) hypertension: Secondary | ICD-10-CM

## 2020-06-10 DIAGNOSIS — Z951 Presence of aortocoronary bypass graft: Secondary | ICD-10-CM | POA: Diagnosis not present

## 2020-06-10 DIAGNOSIS — I2510 Atherosclerotic heart disease of native coronary artery without angina pectoris: Secondary | ICD-10-CM | POA: Diagnosis not present

## 2020-06-10 NOTE — Patient Instructions (Signed)
After Visit Summary:  We will be checking the following labs today - BMET and CBC   Medication Instructions:    Continue with your current medicines.   Let me know if your weight starts going way down - we will stop the Lasix then   If you need a refill on your cardiac medications before your next appointment, please call your pharmacy.     Testing/Procedures To Be Arranged:  N/A  Follow-Up:   See me in about 4 to 5 weeks.     At [facility], you and your health needs are our priority.  As part of our continuing mission to provide you with exceptional heart care, we have created designated Provider Care Teams.  These Care Teams include your primary Cardiologist (physician) and Advanced Practice Providers (APPs -  Physician Assistants and Nurse Practitioners) who all work together to provide you with the care you need, when you need it.  Special Instructions:  . Stay safe, wash your hands for at least 20 seconds and wear a mask when needed.  . It was good to talk with you today.  . Support stockings are fine to wear - [facility] on [address].   Call the [place] office at [phone] if you have any questions, problems or concerns.

## 2020-06-11 ENCOUNTER — Ambulatory Visit: Payer: Medicare HMO | Admitting: Nurse Practitioner

## 2020-06-11 LAB — BASIC METABOLIC PANEL
BUN/Creatinine Ratio: 13 (ref 10–24)
BUN: 16 mg/dL (ref 8–27)
CO2: 23 mmol/L (ref 20–29)
Calcium: 8.9 mg/dL (ref 8.6–10.2)
Chloride: 102 mmol/L (ref 96–106)
Creatinine, Ser: 1.24 mg/dL (ref 0.76–1.27)
GFR calc Af Amer: 66 mL/min/{1.73_m2} (ref >59)
GFR calc non Af Amer: 57 mL/min/{1.73_m2} — ABNORMAL LOW (ref >59)
Glucose: 100 mg/dL — ABNORMAL HIGH (ref 65–99)
Potassium: 4.5 mmol/L (ref 3.5–5.2)
Sodium: 140 mmol/L (ref 134–144)

## 2020-06-11 LAB — CBC
Hematocrit: 30.2 % — ABNORMAL LOW (ref 37.5–51.0)
Hemoglobin: 9.9 g/dL — ABNORMAL LOW (ref 13.0–17.7)
MCH: 31.2 pg (ref 26.6–33.0)
MCHC: 32.8 g/dL (ref 31.5–35.7)
MCV: 95 fL (ref 79–97)
Platelets: 353 10*3/uL (ref 150–450)
RBC: 3.17 x10E6/uL — ABNORMAL LOW (ref 4.14–5.80)
RDW: 14.9 % (ref 11.6–15.4)
WBC: 11.9 10*3/uL — ABNORMAL HIGH (ref 3.4–10.8)

## 2020-06-19 ENCOUNTER — Encounter: Payer: Self-pay | Admitting: Cardiothoracic Surgery

## 2020-06-25 ENCOUNTER — Other Ambulatory Visit: Payer: Self-pay | Admitting: Cardiothoracic Surgery

## 2020-06-25 DIAGNOSIS — Z951 Presence of aortocoronary bypass graft: Secondary | ICD-10-CM

## 2020-06-26 ENCOUNTER — Ambulatory Visit (INDEPENDENT_AMBULATORY_CARE_PROVIDER_SITE_OTHER): Payer: Self-pay | Admitting: Cardiothoracic Surgery

## 2020-06-26 ENCOUNTER — Ambulatory Visit
Admission: RE | Admit: 2020-06-26 | Discharge: 2020-06-26 | Disposition: A | Discharge status: Home / Self Care | Payer: Medicare HMO | Source: Ambulatory Visit | Attending: Cardiothoracic Surgery | Admitting: Cardiothoracic Surgery

## 2020-06-26 ENCOUNTER — Other Ambulatory Visit: Payer: Self-pay

## 2020-06-26 VITALS — BP 136/80 | HR 80 | Temp 97.9°F | Resp 20 | Ht 70.0 in | Wt 222.0 lb

## 2020-06-26 DIAGNOSIS — I2510 Atherosclerotic heart disease of native coronary artery without angina pectoris: Secondary | ICD-10-CM

## 2020-06-26 DIAGNOSIS — Z951 Presence of aortocoronary bypass graft: Secondary | ICD-10-CM

## 2020-06-26 DIAGNOSIS — I700 Atherosclerosis of aorta: Secondary | ICD-10-CM | POA: Diagnosis not present

## 2020-06-26 NOTE — Progress Notes (Signed)
[address]       [place] [address]             [phone]      Dave Robles [place] Medical Record #[id number] Date of Birth: 09-14-1946  Referring: Belva Crome, MD Primary Care: Dorothyann Peng, NP Primary Cardiologist: No primary care provider on file.   Chief Complaint:   POST OP FOLLOW UP OPERATIVE REPORT DATE OF PROCEDURE:  05/27/2020 NAME OF PROCEDURE:  Redo coronary artery bypass grafting with free right internal mammary artery to the obtuse marginal and left radial graft to the posterior descending coronary artery with open left radial harvesting  History of Present Illness:     Patient returns to the office today in follow-up after recent redo coronary artery bypass grafting done on July 13.  He is slowly been progressing increasing his physical activity.  He has had some lower extremity edema and was on a course of Lasix.  He has had no recurrent angina.     Past Medical History:  Diagnosis Date  . AAA (abdominal aortic aneurysm) ([facility])   . AAA (abdominal aortic aneurysm) without rupture ([facility]) 01/19/2017  . Adjustment disorder with depressed mood 03/04/2009  . Arthritis   . Colon polyps   . CORONARY ARTERY DISEASE 07/10/2007  . Coronary artery disease involving native coronary artery of native heart without angina pectoris 07/10/2007   Qualifier: Diagnosis of  By: Scherrie Gerlach    . Depression   . DOE (dyspnea on exertion)   . Episodic mood disorder ([facility]) 08/01/2014  . ERECTILE DYSFUNCTION 07/10/2007  . Extrinsic asthma, unspecified 09/10/2008  . HYPERLIPIDEMIA 07/10/2007  . HYPERTENSION 07/10/2007  . Myocardial infarction ([facility]) 2016   when had angioplasty- was told had a silent MI  . OA (osteoarthritis) of hip 04/19/2018  . Onychomycosis 05/14/2014  . Pain in joint of left knee 01/19/2017  . Personal history of colonic polyps 07/08/2014   1997, 2001 8 mm transverse serrated polyp right colon 2010 07/08/2014 no polyps - repeat colon  2022     . S/P CABG x 2 05/27/2020  . TOBACCO USE 12/15/2009     Social History   Tobacco Use  Smoking Status Former Smoker  . Packs/day: 0.50  . Quit date: 26  . Years since quitting: 30.6  Smokeless Tobacco Never Used    Social History   Substance and Sexual Activity  Alcohol Use Yes  . Alcohol/week: 1.0 standard drink  . Types: 1 Cans of beer per week   Comment: Gin 2 x week     No Known Allergies  Current Outpatient Medications  Medication Sig Dispense Refill  . acetaminophen (TYLENOL) 325 MG tablet Take 650 mg by mouth every 6 (six) hours as needed for moderate pain or headache.     Marland Kitchen aspirin EC 81 MG EC tablet Take 1 tablet (81 mg total) by mouth daily. Swallow whole. 30 tablet 11  . atorvastatin (LIPITOR) 80 MG tablet TAKE 1 TABLET DAILY 90 tablet 3  . buPROPion (WELLBUTRIN XL) 300 MG 24 hr tablet Take 1 tablet (300 mg total) by mouth daily. 90 tablet 1  . calcipotriene (DOVONOX) 0.005 % cream Apply 1 application topically 2 (two) times daily.    . clobetasol cream (TEMOVATE) 6.14 % Apply 1 application topically daily.    . clopidogrel (PLAVIX) 75 MG tablet Take 1 tablet (75 mg total) by mouth daily. 90 tablet 3  . furosemide (LASIX) 40  MG tablet Take 1 tablet (40 mg total) by mouth daily. 30 tablet 1  . isosorbide mononitrate (IMDUR) 30 MG 24 hr tablet Take 1 tablet (30 mg total) by mouth daily. 30 tablet 11  . lisinopril (ZESTRIL) 10 MG tablet Take 1 tablet (10 mg total) by mouth daily. 30 tablet 2  . Melatonin 10 MG TABS Take 20 mg by mouth at bedtime.    . metoprolol tartrate (LOPRESSOR) 25 MG tablet Take 1 tablet (25 mg total) by mouth 2 (two) times daily. 180 tablet 3  . Multiple Vitamin (MULTIVITAMIN) capsule Take 1 capsule by mouth daily.      . nitroGLYCERIN (NITROSTAT) 0.4 MG SL tablet Place 1 tablet (0.4 mg total) under the tongue every 5 (five) minutes as needed. 25 tablet 0  . potassium chloride SA (KLOR-CON) 20 MEQ tablet Take 1 tablet (20 mEq total) by  mouth daily. 30 tablet 1  . sildenafil (VIAGRA) 100 MG tablet Take 100 mg by mouth daily as needed for erectile dysfunction.    . traZODone (DESYREL) 50 MG tablet Take 1 tablet (50 mg total) by mouth at bedtime as needed for sleep. 90 tablet 1   No current facility-administered medications for this visit.       Physical Exam: BP 136/80   Pulse 80   Temp 97.9 F (36.6 C) (Skin)   Resp 20   Ht 5\' 10"  (1.778 m)   Wt 222 lb (100.7 kg)   SpO2 96% Comment: RA  BMI 31.85 kg/m   General appearance: alert Neurologic: intact Heart: regular rate and rhythm, S1, S2 normal, no murmur, click, rub or gallop Lungs: clear to auscultation bilaterally Extremities: extremities normal, atraumatic, no cyanosis or edema Wound: Patient sternum is stable well-healed, left radial harvest sites also well-healed, left hand is neurovascularly intact   Diagnostic Studies & Laboratory data:     Recent Radiology Findings:   DG Chest 2 View  Result Date: 06/26/2020 CLINICAL DATA:  74 year old male status post CABG. EXAM: CHEST - 2 VIEW COMPARISON:  Chest radiograph dated 05/31/2020 FINDINGS: There is a small right pleural effusion and right lung base atelectasis or infiltrate. The left lung is clear. No pneumothorax. Stable cardiac silhouette. Median sternotomy wires and CABG vascular clips. Atherosclerotic calcification of the aorta. Degenerative changes of the spine. No acute osseous pathology. IMPRESSION: Small right pleural effusion and right lung base atelectasis or infiltrate. Electronically Signed   By: Anner Crete M.D.   On: 06/26/2020 15:35      Recent Lab Findings: Lab Results  Component Value Date   WBC 11.9 (H) 06/10/2020   HGB 9.9 (L) 06/10/2020   HCT 30.2 (L) 06/10/2020   PLT 353 06/10/2020   GLUCOSE 100 (H) 06/10/2020   CHOL 119 01/10/2020   TRIG 90.0 01/10/2020   HDL 28.90 (L) 01/10/2020   LDLDIRECT 66.7 11/30/2011   LDLCALC 72 01/10/2020   ALT 38 05/23/2020   AST 26  05/23/2020   NA 140 06/10/2020   K 4.5 06/10/2020   CL 102 06/10/2020   CREATININE 1.24 06/10/2020   BUN 16 06/10/2020   CO2 23 06/10/2020   TSH 2.57 01/10/2020   INR 1.4 (H) 05/28/2020   HGBA1C 5.6 05/23/2020      Assessment / Plan:   #1 status post coronary artery bypass grafting throughout the 13th-patient patient making good progress postoperatively without recurrent angina, increasing his activity appropriately has been referred to cardiac rehab, he does note that he plans to go to [place]  in October.,  Plan to see him back in 3 to 4 weeks   Medication Changes: No orders of the defined types were placed in this encounter.     Grace Isaac MD      [address] [place] [address] Office [phone]     06/26/2020 3:42 PM

## 2020-06-28 ENCOUNTER — Encounter: Payer: Self-pay | Admitting: Cardiothoracic Surgery

## 2020-06-30 ENCOUNTER — Telehealth: Payer: Self-pay

## 2020-06-30 NOTE — Telephone Encounter (Signed)
Patient contacted the office requesting advised as to if he could get a Cortisone injection.  He is s/p redo CABG with Dr. Servando Snare 05/27/20.  I advised that he can get an injection if warranted.  He asked if he needed prophylactic antibiotics for it, he was advised that there was no need for an antibiotic.  He acknowledged receipt.

## 2020-06-30 NOTE — Progress Notes (Addendum)
CARDIOLOGY OFFICE NOTE  Date:  07/09/2020    Dave Robles Date of Birth: [birth year] Medical Record #[id number]  PCP:  Dorothyann Peng, NP  Cardiologist:  Dave Robles   Chief Complaint  Patient presents with  . Follow-up    History of Present Illness: Dave Robles is a 74 y.o. male who presents today for a follow up visit. He is a former patient of Dr. Claris Robles who follows with me.   He has a known history of CAD with original CABG x 3 with LIMA to LAD, SVG to DX and SVG to distal RCA in 1991 per Dr. Redmond Pulling. Other issues include HTN, HLD, ED, pasttobacco abuse, asthma, AAA (last measured in 10/2019 - 3.4 cm)and depression.He had a non-ST elevationmyocardial infarction in 2016 while vacationing in [place].Cathnoted to have NL left main, 90% prox LAD, 99% mid LAD with patent LIMA to mid LAD and patent SVG to DX. LCX origin stenosis vs flow void? Fistula vs competitive flow in branch?, 100% RCA, SVG to RCA 99% origin treated with Xience DES and PTCA to the distal.  I have followed him for many years. Seen as a work in back in June - noting more DOE which is his anginal equivalent - referred on for cardiac cath that led to consultation for repeat CABG. The patient and his studies were evaluated by Dr. Servando Robles who felt the patient was a candidate for redo CABG. On 05/27/2020 he was taken to the operating room where he underwent redo coronary artery bypass grafting x2. A left radial artery was placed to the PDA, and a free right internal mammary artery was placed to the obtuse marginal. Post op course was eventful for needing inotropic support initially. He did have acute blood loss anemia and required transfusion. Some drop in platelets but HIT negative. I saw him for his first post op visit late last month - he was doing well. Did require extended course of diuretics.   Comes in today. Here alone,. He is feeling better. Off Lasix and potassium along with the Imdur now. BP  trending up at home. He has done very well with efforts at continued weight loss - he is also doing NOOM. He is walking 2 miles a day with no problem. Energy level returning. Some retained stitches in the left arm and upper sternum. No fever. Not short of breath. He is going to [place] in October for 6 to 8 weeks - will be back here for the holidays. Rehab here does not seem like that is going to work but there are options for him in [place] that he is looking into.   Past Medical History:  Diagnosis Date  . AAA (abdominal aortic aneurysm) ([facility])   . AAA (abdominal aortic aneurysm) without rupture ([facility]) 01/19/2017  . Adjustment disorder with depressed mood 03/04/2009  . Arthritis   . Colon polyps   . CORONARY ARTERY DISEASE 07/10/2007  . Coronary artery disease involving native coronary artery of native heart without angina pectoris 07/10/2007   Qualifier: Diagnosis of  By: Scherrie Gerlach    . Depression   . DOE (dyspnea on exertion)   . Episodic mood disorder ([facility]) 08/01/2014  . ERECTILE DYSFUNCTION 07/10/2007  . Extrinsic asthma, unspecified 09/10/2008  . HYPERLIPIDEMIA 07/10/2007  . HYPERTENSION 07/10/2007  . Myocardial infarction ([facility]) 2016   when had angioplasty- was told had a silent MI  . OA (osteoarthritis) of hip 04/19/2018  . Onychomycosis 05/14/2014  . Pain in joint  of left knee 01/19/2017  . Personal history of colonic polyps 07/08/2014   1997, 2001 8 mm transverse serrated polyp right colon 2010 07/08/2014 no polyps - repeat colon 2022     . S/P CABG x 2 05/27/2020  . TOBACCO USE 12/15/2009    Past Surgical History:  Procedure Laterality Date  . COLONOSCOPY    . CORONARY ANGIOPLASTY  12/2014  . CORONARY ARTERY BYPASS GRAFT  11/15/89   5 vessels  . CORONARY ARTERY BYPASS GRAFT N/A 05/27/2020   Procedure: REDO CORONARY ARTERY BYPASS GRAFTING (CABG), ON PUMP, TIMES TWO, USING RIGHT INTERNAL MAMMARY ARTERY AND LEFT RADIAL ARTERY (OPEN);  Surgeon: Grace Isaac, MD;  Location: [facility];   Service: Open Heart Surgery;  Laterality: N/A;  Left Radial Artery to PDA FREE RIMA to OM  . HYDROCELE EXCISION / REPAIR  2011  . INGUINAL HERNIA REPAIR Left 1985  . LEFT HEART CATH AND CORS/GRAFTS ANGIOGRAPHY N/A 05/13/2020   Procedure: LEFT HEART CATH AND CORS/GRAFTS ANGIOGRAPHY;  Surgeon: Belva Crome, MD;  Location: [facility];  Service: Cardiovascular;  Laterality: N/A;  . MEDIASTERNOTOMY N/A 05/27/2020   Procedure: MEDIAN STERNOTOMY;  Surgeon: Grace Isaac, MD;  Location: [facility];  Service: Open Heart Surgery;  Laterality: N/A;  Redo Median Sternotomy  . RADIAL ARTERY HARVEST Left 05/27/2020   Procedure: RADIAL ARTERY HARVEST;  Surgeon: Grace Isaac, MD;  Location: [facility];  Service: Open Heart Surgery;  Laterality: Left;  . TEE WITHOUT CARDIOVERSION N/A 05/27/2020   Procedure: TRANSESOPHAGEAL ECHOCARDIOGRAM (TEE);  Surgeon: Grace Isaac, MD;  Location: [facility];  Service: Open Heart Surgery;  Laterality: N/A;  . TOTAL HIP ARTHROPLASTY Right 04/19/2018   Procedure: RIGHT TOTAL HIP ARTHROPLASTY ANTERIOR APPROACH;  Surgeon: Gaynelle Arabian, MD;  Location: [facility];  Service: Orthopedics;  Laterality: Right;     Medications: Current Meds  Medication Sig  . acetaminophen (TYLENOL) 325 MG tablet Take 650 mg by mouth every 6 (six) hours as needed for moderate pain or headache.   Marland Kitchen aspirin EC 81 MG EC tablet Take 1 tablet (81 mg total) by mouth daily. Swallow whole.  Marland Kitchen atorvastatin (LIPITOR) 80 MG tablet TAKE 1 TABLET DAILY  . buPROPion (WELLBUTRIN XL) 300 MG 24 hr tablet Take 1 tablet (300 mg total) by mouth daily.  . calcipotriene (DOVONOX) 0.005 % cream Apply 1 application topically 2 (two) times daily.  . clobetasol cream (TEMOVATE) 7.90 % Apply 1 application topically daily.  . clopidogrel (PLAVIX) 75 MG tablet Take 1 tablet (75 mg total) by mouth daily.  . Melatonin 10 MG TABS Take 20 mg by mouth at bedtime.  . metoprolol tartrate (LOPRESSOR) 25 MG tablet Take 1 tablet  (25 mg total) by mouth 2 (two) times daily.  . Multiple Vitamin (MULTIVITAMIN) capsule Take 1 capsule by mouth daily.    . nitroGLYCERIN (NITROSTAT) 0.4 MG SL tablet Place 1 tablet (0.4 mg total) under the tongue every 5 (five) minutes as needed.  . sildenafil (VIAGRA) 100 MG tablet Take 100 mg by mouth daily as needed for erectile dysfunction.  . traZODone (DESYREL) 50 MG tablet Take 1 tablet (50 mg total) by mouth at bedtime as needed for sleep.  . [DISCONTINUED] lisinopril (ZESTRIL) 10 MG tablet Take 1 tablet (10 mg total) by mouth daily.     Allergies: No Known Allergies  Social History: The patient  reports that he quit smoking about 30 years ago. He smoked 0.50 packs per day. He has  never used smokeless tobacco. He reports current alcohol use of about 1.0 standard drink of alcohol per week. He reports that he does not use drugs.   Family History: The patient's family history includes Cancer in his mother; Coronary artery disease in his father; Depression in his mother and paternal grandfather; Hyperlipidemia in an other family member; Hypertension in an other family member.   Review of Systems: Please see the history of present illness.   All other systems are reviewed and negative.   Physical Exam: VS:  BP 110/80   Pulse 81   Ht 5\' 10"  (1.778 m)   Wt 222 lb (100.7 kg)   SpO2 95%   BMI 31.85 kg/m  .  BMI Body mass index is 31.85 kg/m.  Wt Readings from Last 3 Encounters:  07/09/20 222 lb (100.7 kg)  06/26/20 222 lb (100.7 kg)  06/10/20 (!) 239 lb 3.2 oz (108.5 kg)    BP 138/80 by me.   General: Alert and in no acute distress.  His weight is coming down nicely.  Cardiac: Regular rate and rhythm. No murmurs, rubs, or gallops. One loose retained suture at the upper aspect of his sternal incision that was removed. Little delayed healing of the left radial site but overall ok. No edema.  Respiratory:  Lungs are clear to auscultation bilaterally with normal work of breathing.   GI: Soft and nontender.  MS: No deformity or atrophy. Gait and ROM intact.  Skin: Warm and dry. Color is normal.  Neuro:  Strength and sensation are intact and no gross focal deficits noted.  Psych: Alert, appropriate and with normal affect.   LABORATORY DATA:  EKG:  EKG is not ordered today.    Lab Results  Component Value Date   WBC 11.9 (H) 06/10/2020   HGB 9.9 (L) 06/10/2020   HCT 30.2 (L) 06/10/2020   PLT 353 06/10/2020   GLUCOSE 100 (H) 06/10/2020   CHOL 119 01/10/2020   TRIG 90.0 01/10/2020   HDL 28.90 (L) 01/10/2020   LDLDIRECT 66.7 11/30/2011   LDLCALC 72 01/10/2020   ALT 38 05/23/2020   AST 26 05/23/2020   NA 140 06/10/2020   K 4.5 06/10/2020   CL 102 06/10/2020   CREATININE 1.24 06/10/2020   BUN 16 06/10/2020   CO2 23 06/10/2020   TSH 2.57 01/10/2020   PSA 1.30 01/10/2020   INR 1.4 (H) 05/28/2020   HGBA1C 5.6 05/23/2020     BNP (last 3 results) No results for input(s): BNP in the last 8760 hours.  ProBNP (last 3 results) No results for input(s): PROBNP in the last 8760 hours.   Other Studies Reviewed Today:  OPERATIVE REPORT  DATE OF PROCEDURE: 05/27/2020  NAME OF PROCEDURE: Redo coronary artery bypass grafting with free right internal mammary artery to the obtuse marginal and left radial graft to the posterior descending coronary artery with open left radial harvesting  SURGEON: Lanelle Bal, MD  FIRST ASSISTANT: Jadene Pierini, PA    ECHO IMPRESSIONS 05/2020   1. Left ventricular ejection fraction, by estimation, is 60 to 65%. The  left ventricle has normal function. The left ventricle has no regional  wall motion abnormalities. There is moderate left ventricular hypertrophy.  Left ventricular diastolic  parameters were normal.  2. Right ventricular systolic function is mildly reduced. The right  ventricular size is normal. Tricuspid regurgitation signal is inadequate  for assessing PA pressure.  3. Left atrial size was  moderately dilated.  4. The mitral valve is  normal in structure. Trivial mitral valve  regurgitation.  5. The aortic valve is tricuspid. Aortic valve regurgitation is not  visualized. Mild to moderate aortic valve sclerosis/calcification is  present, without any evidence of aortic stenosis.  6. The inferior vena cava is normal in size with greater than 50%  respiratory variability, suggesting right atrial pressure of 3 mmHg.    Assessment/Plan:  1. Redo CABG with free RIMA to OM and left radial to PD - making good progress. EF was normal. Repeat lab today. He is going to look into rehab in [place]. Continue aspirin/beta blocker/statin and meds for BP.   2. HTN - BP trending up - he has had good success with weight loss - restarting Lisinopril HCT but at lower dose. Lab today - BMET next month.   3. HLD - on statin therapy - lab today.   4. Post op blood loss anemia - rechecking lab today.   5. AAA - will need repeat duplex arranged on return.   6. History of bradycardia - remains on beta blocker - HR is fine.   Current medicines are reviewed with the patient today.  The patient does not have concerns regarding medicines other than what has been noted above.  The following changes have been made:  See above.  Labs/ tests ordered today include:    Orders Placed This Encounter  Procedures  . Basic metabolic panel  . CBC  . Hepatic function panel  . Lipid panel  . Basic metabolic panel     Disposition:   FU with me in early December. Sending my note from today to his PCP and cardiologist in [place] - Dr. Moshe Salisbury and Dr. Brayton Caves.    Dr. Earle Gell - fax [phone] Dr. Brayton Caves - fax [phone]  Patient is agreeable to this plan and will call if any problems develop in the interim.   SignedTruitt Merle, NP  07/09/2020 3:18 PM  [facility] [address] [place], [place]  [address] Phone: [phone] Fax: [phone]

## 2020-07-02 ENCOUNTER — Telehealth (HOSPITAL_COMMUNITY): Payer: Self-pay

## 2020-07-02 NOTE — Telephone Encounter (Signed)
Called and spoke with pt in regards to CR, pt stated he will be going out of town for a long period of time (Oct-Nov, Nov-Feb). He will contact CR when he is ready to schedule.  Closed referral

## 2020-07-04 ENCOUNTER — Telehealth (HOSPITAL_COMMUNITY): Payer: Self-pay | Admitting: Adult Health

## 2020-07-04 DIAGNOSIS — Z96641 Presence of right artificial hip joint: Secondary | ICD-10-CM | POA: Diagnosis not present

## 2020-07-04 DIAGNOSIS — Z471 Aftercare following joint replacement surgery: Secondary | ICD-10-CM | POA: Diagnosis not present

## 2020-07-07 NOTE — Telephone Encounter (Signed)
Returned pt phone, pt had questions in regards to CR. Adv pt again that our CR program is 8 weeks (will be able to commit to a full 4 weeks 1/2). Pt stated he will be going out of town in October return in Nov., and back out middle Nov, will not return for 2 months. Adv pt he will need to contact CR when he is ready.  As of today's schedule CR is scheduling for the middle of Sep. Into Oct.

## 2020-07-09 ENCOUNTER — Other Ambulatory Visit: Payer: Self-pay

## 2020-07-09 ENCOUNTER — Encounter: Payer: Self-pay | Admitting: Nurse Practitioner

## 2020-07-09 ENCOUNTER — Ambulatory Visit: Payer: Medicare HMO | Admitting: Nurse Practitioner

## 2020-07-09 VITALS — BP 110/80 | HR 81 | Ht 70.0 in | Wt 222.0 lb

## 2020-07-09 DIAGNOSIS — I10 Essential (primary) hypertension: Secondary | ICD-10-CM

## 2020-07-09 DIAGNOSIS — E7800 Pure hypercholesterolemia, unspecified: Secondary | ICD-10-CM | POA: Diagnosis not present

## 2020-07-09 DIAGNOSIS — Z951 Presence of aortocoronary bypass graft: Secondary | ICD-10-CM | POA: Diagnosis not present

## 2020-07-09 MED ORDER — LISINOPRIL-HYDROCHLOROTHIAZIDE 10-12.5 MG PO TABS: 1.0000 | ORAL_TABLET | Freq: Every day | ORAL | 3 refills | Status: DC

## 2020-07-09 NOTE — Patient Instructions (Addendum)
After Visit Summary:  We will be checking the following labs today - BMET, CBC, HPF and Lipids  BMET on 9/23 - the day of your visit with Dr. Servando Snare - do not need to fast and can come at any time that day.    Medication Instructions:    Continue with your current medicines. BUT  I am restarting the Lisinopril HCT but at a lower dose of 10-12.5 mg to take once a day - I did send this to the drug store.    If you need a refill on your cardiac medications before your next appointment, please call your pharmacy.     Testing/Procedures To Be Arranged:  N/A  Follow-Up:   See me sometime in early December    At [facility], you and your health needs are our priority.  As part of our continuing mission to provide you with exceptional heart care, we have created designated Provider Care Teams.  These Care Teams include your primary Cardiologist (physician) and Advanced Practice Providers (APPs -  Physician Assistants and Nurse Practitioners) who all work together to provide you with the care you need, when you need it.  Special Instructions:  . Stay safe, wash your hands for at least 20 seconds and wear a mask when needed.  . It was good to talk with you today.  . Send me the information for the rehab in [place].  Marland Kitchen Keep up the great work! . Keep a check on your BP for me.    Call the [place] office at [phone] if you have any questions, problems or concerns.

## 2020-07-10 LAB — HEPATIC FUNCTION PANEL
ALT: 29 IU/L (ref 0–44)
AST: 25 IU/L (ref 0–40)
Albumin: 3.7 g/dL (ref 3.7–4.7)
Alkaline Phosphatase: 123 IU/L — ABNORMAL HIGH (ref 48–121)
Bilirubin Total: 0.7 mg/dL (ref 0.0–1.2)
Bilirubin, Direct: 0.28 mg/dL (ref 0.00–0.40)
Total Protein: 6.1 g/dL (ref 6.0–8.5)

## 2020-07-10 LAB — BASIC METABOLIC PANEL
BUN/Creatinine Ratio: 12 (ref 10–24)
BUN: 13 mg/dL (ref 8–27)
CO2: 25 mmol/L (ref 20–29)
Calcium: 9.7 mg/dL (ref 8.6–10.2)
Chloride: 103 mmol/L (ref 96–106)
Creatinine, Ser: 1.12 mg/dL (ref 0.76–1.27)
GFR calc Af Amer: 75 mL/min/{1.73_m2} (ref >59)
GFR calc non Af Amer: 65 mL/min/{1.73_m2} (ref >59)
Glucose: 103 mg/dL — ABNORMAL HIGH (ref 65–99)
Potassium: 4.1 mmol/L (ref 3.5–5.2)
Sodium: 141 mmol/L (ref 134–144)

## 2020-07-10 LAB — CBC
Hematocrit: 39.9 % (ref 37.5–51.0)
Hemoglobin: 12.9 g/dL — ABNORMAL LOW (ref 13.0–17.7)
MCH: 29.3 pg (ref 26.6–33.0)
MCHC: 32.3 g/dL (ref 31.5–35.7)
MCV: 91 fL (ref 79–97)
Platelets: 323 10*3/uL (ref 150–450)
RBC: 4.4 x10E6/uL (ref 4.14–5.80)
RDW: 14.5 % (ref 11.6–15.4)
WBC: 11.8 10*3/uL — ABNORMAL HIGH (ref 3.4–10.8)

## 2020-07-10 LAB — LIPID PANEL
Chol/HDL Ratio: 3.1 ratio (ref 0.0–5.0)
Cholesterol, Total: 92 mg/dL — ABNORMAL LOW (ref 100–199)
HDL: 30 mg/dL — ABNORMAL LOW (ref >39)
LDL Chol Calc (NIH): 44 mg/dL (ref 0–99)
Triglycerides: 89 mg/dL (ref 0–149)
VLDL Cholesterol Cal: 18 mg/dL (ref 5–40)

## 2020-07-13 ENCOUNTER — Other Ambulatory Visit: Payer: Self-pay | Admitting: Adult Health

## 2020-07-13 DIAGNOSIS — F5101 Primary insomnia: Secondary | ICD-10-CM

## 2020-07-14 NOTE — Telephone Encounter (Signed)
S/w Mickel Baas at [facility] and requested  1. Demographics 2. H & P 3. D/C summary 4. Last ov note 5. Order for Cardiac Rehab 6.  Operative Note  Went to medical records stated did not need a signed release form from patient.   Faxed to AttMickel Baas @ [phone]  Phone # [phone].  Faxed today.

## 2020-07-15 DIAGNOSIS — R69 Illness, unspecified: Secondary | ICD-10-CM | POA: Diagnosis not present

## 2020-07-17 ENCOUNTER — Telehealth: Payer: Self-pay | Admitting: Nurse Practitioner

## 2020-07-17 NOTE — Telephone Encounter (Signed)
1. What dental office are you calling from? [facility]   2. What is your office phone number?  [phone]  3. What is your fax number? [phone]  4. What type of procedure is the patient having performed? Filling   5. What date is procedure scheduled or is the patient there now? TBD (if the patient is at the dentist's office question goes to their cardiologist if he/she is in the office.  If not, question should go to the DOD).   6. What is your question (ex. Antibiotics prior to procedure, holding medication-we need to know how long dentist wants pt to hold med)? If any antibiotics are needed prior to procedure as well as if it is okay for him to be placed under anesthesia with epinephrine

## 2020-07-17 NOTE — Telephone Encounter (Signed)
   Primary Cardiologist: [name] and Dr. Aundra Dubin  Chart reviewed as part of pre-operative protocol coverage.   Simple dental extractions and filling are considered low risk procedures per guidelines and generally do not require any specific cardiac clearance. It is also generally accepted that for simple extractions and dental cleanings, there is no need to interrupt blood thinner therapy.  SBE prophylaxis is not required for the patient as the patient did not have any history of valve replacement/repair, congenital heart disease or endocarditis.  May use local epinephrine if needed.   I will route this recommendation to the requesting party via Epic fax function and remove from pre-op pool.  Please call with questions.  Vivian, [place] 07/17/2020, 4:37 PM

## 2020-07-22 DIAGNOSIS — Z0100 Encounter for examination of eyes and vision without abnormal findings: Secondary | ICD-10-CM | POA: Diagnosis not present

## 2020-07-30 ENCOUNTER — Other Ambulatory Visit: Payer: Self-pay | Admitting: Nurse Practitioner

## 2020-07-31 ENCOUNTER — Telehealth: Payer: Self-pay | Admitting: Adult Health

## 2020-07-31 DIAGNOSIS — R69 Illness, unspecified: Secondary | ICD-10-CM | POA: Diagnosis not present

## 2020-07-31 DIAGNOSIS — F5101 Primary insomnia: Secondary | ICD-10-CM

## 2020-07-31 MED ORDER — TRAZODONE HCL 50 MG PO TABS: ORAL_TABLET | ORAL | 0 refills | Status: DC

## 2020-07-31 NOTE — Telephone Encounter (Signed)
[facility] called to requested a refill for Trazodone-they stated the pt called them on 9/10 and they faxed over a requested but has not gotten anything back.   Y:[phone] F: [phone]

## 2020-08-05 DIAGNOSIS — R69 Illness, unspecified: Secondary | ICD-10-CM | POA: Diagnosis not present

## 2020-08-06 ENCOUNTER — Other Ambulatory Visit: Payer: Self-pay | Admitting: Cardiothoracic Surgery

## 2020-08-06 DIAGNOSIS — Z951 Presence of aortocoronary bypass graft: Secondary | ICD-10-CM

## 2020-08-06 DIAGNOSIS — L570 Actinic keratosis: Secondary | ICD-10-CM | POA: Diagnosis not present

## 2020-08-07 ENCOUNTER — Ambulatory Visit (INDEPENDENT_AMBULATORY_CARE_PROVIDER_SITE_OTHER): Payer: Self-pay | Admitting: Cardiothoracic Surgery

## 2020-08-07 ENCOUNTER — Other Ambulatory Visit: Payer: Self-pay

## 2020-08-07 ENCOUNTER — Ambulatory Visit
Admission: RE | Admit: 2020-08-07 | Discharge: 2020-08-07 | Disposition: A | Discharge status: Home / Self Care | Payer: Medicare HMO | Source: Ambulatory Visit | Attending: Cardiothoracic Surgery | Admitting: Cardiothoracic Surgery

## 2020-08-07 ENCOUNTER — Other Ambulatory Visit: Payer: Self-pay | Admitting: *Deleted

## 2020-08-07 VITALS — BP 134/83 | HR 64 | Temp 97.6°F | Resp 20 | Ht 70.0 in | Wt 215.0 lb

## 2020-08-07 DIAGNOSIS — I2510 Atherosclerotic heart disease of native coronary artery without angina pectoris: Secondary | ICD-10-CM

## 2020-08-07 DIAGNOSIS — I10 Essential (primary) hypertension: Secondary | ICD-10-CM | POA: Diagnosis not present

## 2020-08-07 DIAGNOSIS — Z951 Presence of aortocoronary bypass graft: Secondary | ICD-10-CM

## 2020-08-07 DIAGNOSIS — J90 Pleural effusion, not elsewhere classified: Secondary | ICD-10-CM | POA: Diagnosis not present

## 2020-08-07 LAB — BASIC METABOLIC PANEL
BUN/Creatinine Ratio: 10 (ref 10–24)
BUN: 11 mg/dL (ref 8–27)
CO2: 26 mmol/L (ref 20–29)
Calcium: 9.6 mg/dL (ref 8.6–10.2)
Chloride: 103 mmol/L (ref 96–106)
Creatinine, Ser: 1.13 mg/dL (ref 0.76–1.27)
GFR calc Af Amer: 74 mL/min/{1.73_m2} (ref >59)
GFR calc non Af Amer: 64 mL/min/{1.73_m2} (ref >59)
Glucose: 104 mg/dL — ABNORMAL HIGH (ref 65–99)
Potassium: 3.9 mmol/L (ref 3.5–5.2)
Sodium: 142 mmol/L (ref 134–144)

## 2020-08-07 NOTE — Progress Notes (Signed)
[address]       [place] [address]             [phone]      Dave Robles [place] Medical Record #[id number] Date of Birth: 10-06-1946  Referring: Belva Crome, MD Primary Care: Dorothyann Peng, NP Primary Cardiologist: No primary care provider on file.   Chief Complaint:   POST OP FOLLOW UP OPERATIVE REPORT DATE OF PROCEDURE:  05/27/2020 PROCEDURE:  Redo coronary artery bypass grafting with free right internal mammary artery to the obtuse marginal and left radial graft to the posterior descending coronary artery with open left radial harvesting  History of Present Illness:     Patient continues to make good progress postoperatively.  He continues to increase his physical activity without symptoms of recurrent angina.  He notes he is planning on traveling to [place] in early October.  He plans to start cardiac rehab when he gets to [place].  He notes some increasing bruising especially on his arms which he attributes to aspirin and Plavix.   Past Medical History:  Diagnosis Date  . AAA (abdominal aortic aneurysm) ([place])   . AAA (abdominal aortic aneurysm) without rupture ([place]) 01/19/2017  . Adjustment disorder with depressed mood 03/04/2009  . Arthritis   . Colon polyps   . CORONARY ARTERY DISEASE 07/10/2007  . Coronary artery disease involving native coronary artery of native heart without angina pectoris 07/10/2007   Qualifier: Diagnosis of  By: Scherrie Gerlach    . Depression   . DOE (dyspnea on exertion)   . Episodic mood disorder ([place]) 08/01/2014  . ERECTILE DYSFUNCTION 07/10/2007  . Extrinsic asthma, unspecified 09/10/2008  . HYPERLIPIDEMIA 07/10/2007  . HYPERTENSION 07/10/2007  . Myocardial infarction ([place] [facility]) 2016   when had angioplasty- was told had a silent MI  . OA (osteoarthritis) of hip 04/19/2018  . Onychomycosis 05/14/2014  . Pain in joint of left knee 01/19/2017  . Personal history of colonic polyps 07/08/2014   1997, 2001 8 mm  transverse serrated polyp right colon 2010 07/08/2014 no polyps - repeat colon 2022     . S/P CABG x 2 05/27/2020  . TOBACCO USE 12/15/2009     Social History   Tobacco Use  Smoking Status Former Smoker  . Packs/day: 0.50  . Quit date: 5  . Years since quitting: 30.7  Smokeless Tobacco Never Used    Social History   Substance and Sexual Activity  Alcohol Use Yes  . Alcohol/week: 1.0 standard drink  . Types: 1 Cans of beer per week   Comment: Gin 2 x week     No Known Allergies  Current Outpatient Medications  Medication Sig Dispense Refill  . acetaminophen (TYLENOL) 325 MG tablet Take 650 mg by mouth every 6 (six) hours as needed for moderate pain or headache.     Marland Kitchen aspirin EC 81 MG EC tablet Take 1 tablet (81 mg total) by mouth daily. Swallow whole. 30 tablet 11  . atorvastatin (LIPITOR) 80 MG tablet TAKE 1 TABLET DAILY 90 tablet 3  . buPROPion (WELLBUTRIN XL) 300 MG 24 hr tablet Take 1 tablet (300 mg total) by mouth daily. 90 tablet 1  . calcipotriene (DOVONOX) 0.005 % cream Apply 1 application topically 2 (two) times daily.    . clobetasol cream (TEMOVATE) 4.17 % Apply 1 application topically daily.    . clopidogrel (PLAVIX) 75 MG tablet Take 1 tablet (75 mg total) by mouth daily. [place]  tablet 3  . lisinopril-hydrochlorothiazide (ZESTORETIC) 10-12.5 MG tablet Take 1 tablet by mouth daily. 90 tablet 3  . Melatonin 10 MG TABS Take 20 mg by mouth at bedtime.    . metoprolol tartrate (LOPRESSOR) 25 MG tablet Take 1 tablet (25 mg total) by mouth 2 (two) times daily. 180 tablet 3  . Multiple Vitamin (MULTIVITAMIN) capsule Take 1 capsule by mouth daily.      . nitroGLYCERIN (NITROSTAT) 0.4 MG SL tablet Place 1 tablet (0.4 mg total) under the tongue every 5 (five) minutes as needed. 25 tablet 0  . sildenafil (VIAGRA) 100 MG tablet Take 100 mg by mouth daily as needed for erectile dysfunction.    . traZODone (DESYREL) 50 MG tablet TAKE 1 TABLET AT BEDTIME ASNEEDED FOR SLEEP 90 tablet  0   No current facility-administered medications for this visit.       Physical Exam: BP 134/83   Pulse 64   Temp 97.6 F (36.4 C) (Skin)   Resp 20   Ht 5\' 10"  (1.778 m)   Wt 215 lb (97.5 kg)   SpO2 95% Comment: RA  BMI 30.85 kg/m  General appearance: alert, cooperative and no distress Neck: no adenopathy, no carotid bruit, no JVD, supple, symmetrical, trachea midline and thyroid not enlarged, symmetric, no tenderness/mass/nodules Resp: clear to auscultation bilaterally Cardio: regular rate and rhythm, S1, S2 normal, no murmur, click, rub or gallop GI: soft, non-tender; bowel sounds normal; no masses,  no organomegaly Extremities: extremities normal, atraumatic, no cyanosis or edema and Left radial harvest site healing well Neurologic: Grossly normal  Diagnostic Studies & Laboratory data:     Recent Radiology Findings:   DG Chest 2 View  Result Date: 08/07/2020 CLINICAL DATA:  History of CABG on 05/27/2020 EXAM: CHEST - 2 VIEW COMPARISON:  06/26/2020 FINDINGS: Post CABG changes. Normal heart size. Atherosclerotic calcification of the aortic knob. Interval decrease in small right-sided pleural effusion with a suspected trace amount of pleural fluid remaining. Improved aeration of the right lung base. Left lung remains clear. No pneumothorax. Degenerative changes within the thoracic spine. IMPRESSION: Interval decrease in small right pleural effusion with improved aeration of the right lung base. Electronically Signed   By: Davina Poke D.O.   On: 08/07/2020 10:38    I have independently reviewed the above radiology studies  and reviewed the findings with the patient.    Recent Lab Findings: Lab Results  Component Value Date   WBC 11.8 (H) 07/09/2020   HGB 12.9 (L) 07/09/2020   HCT 39.9 07/09/2020   PLT 323 07/09/2020   GLUCOSE 103 (H) 07/09/2020   CHOL 92 (L) 07/09/2020   TRIG 89 07/09/2020   HDL 30 (L) 07/09/2020   LDLDIRECT 66.7 11/30/2011   LDLCALC 44  07/09/2020   ALT 29 07/09/2020   AST 25 07/09/2020   NA 141 07/09/2020   K 4.1 07/09/2020   CL 103 07/09/2020   CREATININE 1.12 07/09/2020   BUN 13 07/09/2020   CO2 25 07/09/2020   TSH 2.57 01/10/2020   INR 1.4 (H) 05/28/2020   HGBA1C 5.6 05/23/2020      Assessment / Plan:   Patient continues to make good progress after his redo coronary artery bypass grafting in mid July.  He will discuss with cardiology in December on his return appointment when he could consider stopping Plavix, currently continues on aspirin 81 mg a day and 75 g of Plavix a day  Plan to see him back in the surgical office  as needed  Medication Changes: No orders of the defined types were placed in this encounter.     Grace Isaac MD      [address] [place], [address] Office [phone]     08/07/2020 12:04 PM

## 2020-08-13 ENCOUNTER — Ambulatory Visit: Payer: Medicare HMO | Admitting: Nurse Practitioner

## 2020-08-13 ENCOUNTER — Encounter: Payer: Self-pay | Admitting: Adult Health

## 2020-08-21 ENCOUNTER — Telehealth: Payer: Self-pay | Admitting: Adult Health

## 2020-08-21 NOTE — Progress Notes (Signed)
°  Chronic Care Management   Outreach Note  08/21/2020 Name: KEMOND AMORIN MRN: [id number] DOB: 11-02-1946  Referred by: Dorothyann Peng, NP Reason for referral : No chief complaint on file.   An unsuccessful telephone outreach was attempted today. The patient was referred to the pharmacist for assistance with care management and care coordination.   Follow Up Plan:   Carley Perdue [employer] Scheduler

## 2020-08-27 ENCOUNTER — Telehealth: Payer: Self-pay | Admitting: Adult Health

## 2020-08-27 DIAGNOSIS — Z9861 Coronary angioplasty status: Secondary | ICD-10-CM | POA: Diagnosis not present

## 2020-08-27 DIAGNOSIS — I2510 Atherosclerotic heart disease of native coronary artery without angina pectoris: Secondary | ICD-10-CM | POA: Diagnosis not present

## 2020-08-27 DIAGNOSIS — E782 Mixed hyperlipidemia: Secondary | ICD-10-CM | POA: Diagnosis not present

## 2020-08-27 DIAGNOSIS — I252 Old myocardial infarction: Secondary | ICD-10-CM | POA: Diagnosis not present

## 2020-08-27 DIAGNOSIS — Z951 Presence of aortocoronary bypass graft: Secondary | ICD-10-CM | POA: Diagnosis not present

## 2020-08-27 DIAGNOSIS — I119 Hypertensive heart disease without heart failure: Secondary | ICD-10-CM | POA: Diagnosis not present

## 2020-08-27 NOTE — Progress Notes (Signed)
°  Chronic Care Management   Outreach Note  08/27/2020 Name: SIRCHARLES HOLZHEIMER MRN: [id number] DOB: 03-17-1946  Referred by: Dorothyann Peng, NP Reason for referral : No chief complaint on file.   A second unsuccessful telephone outreach was attempted today. The patient was referred to pharmacist for assistance with care management and care coordination.  Follow Up Plan:   Carley Perdue [employer] Scheduler

## 2020-08-28 ENCOUNTER — Telehealth: Payer: Self-pay | Admitting: Adult Health

## 2020-08-28 NOTE — Progress Notes (Signed)
  Chronic Care Management   Note  08/28/2020 Name: Dave Robles MRN: [id number] DOB: 12/27/1945  Dave Robles is a 74 y.o. year old male who is a primary care patient of Dorothyann Peng, NP. I reached out to [employer] by phone today in response to a referral sent by Mr. [name]'s PCP, Dorothyann Peng, NP.   [name] was given information about Chronic Care Management services today including:  1. CCM service includes personalized support from designated clinical staff supervised by his physician, including individualized plan of care and coordination with other care providers 2. 24/7 contact phone numbers for assistance for urgent and routine care needs. 3. Service will only be billed when office clinical staff spend 20 minutes or more in a month to coordinate care. 4. Only one practitioner may furnish and bill the service in a calendar month. 5. The patient may stop CCM services at any time (effective at the end of the month) by phone call to the office staff.   Patient wishes to consider information provided and/or speak with a member of the care team before deciding about enrollment in care management services.   Follow up plan:   Carley Perdue [employer] Scheduler

## 2020-09-01 ENCOUNTER — Ambulatory Visit: Payer: Medicare HMO | Admitting: Psychologist

## 2020-09-02 DIAGNOSIS — R69 Illness, unspecified: Secondary | ICD-10-CM | POA: Diagnosis not present

## 2020-09-10 ENCOUNTER — Encounter: Payer: Self-pay | Admitting: Nurse Practitioner

## 2020-09-10 ENCOUNTER — Telehealth: Payer: Self-pay | Admitting: *Deleted

## 2020-09-10 NOTE — Telephone Encounter (Signed)
   Primary Cardiologist:   Chart reviewed as part of pre-operative protocol coverage. Patient had redo CABG 05/27/20 and seen twice back in follow-up since that time and was doing well from a cardiac standpoint. I spoke to the patient over the phone and he said he is still doing well, going on 15 mile bike rides weekly. He denies chest pain, shortness of breath, palpitations, lower leg edema, and orthopnea. He plans to start cardiac rehab soon. He is taking Aspirin and Plavix. Patient to undergo 1 tooth filling. Patient does NOT need to hold Aspirin or Plavix prior to procedure, continue medications as prescribed.   Given past medical history and time since last visit, based on ACC/AHA guidelines, Dave Robles would be at acceptable risk for the planned procedure without further cardiovascular testing.   The patient was advised that if he develops new symptoms prior to surgery to contact our office to arrange for a follow-up visit, and he verbalized understanding.  I will route this recommendation to the requesting party via Epic fax function and remove from pre-op pool.  Please call with questions.  Georgiann Neider Ninfa Meeker, PA-C 09/10/2020, 1:33 PM

## 2020-09-10 NOTE — Telephone Encounter (Signed)
° °  [facility] Pre-operative Risk Assessment    HEARTCARE STAFF: - Please ensure there is not already an duplicate clearance open for this procedure. - Under Visit Info/Reason for Call, type in Other and utilize the format Clearance MM/DD/YY or Clearance TBD. Do not use dashes or single digits. - If request is for dental extraction, please clarify the # of teeth to be extracted.  Request for surgical clearance:  1. What type of surgery is being performed? POSTERIOR COMPOSITE MESH FILLING   2. When is this surgery scheduled? TBD   3. What type of clearance is required (medical clearance vs. Pharmacy clearance to hold med vs. Both)? MEDICAL  4. Are there any medications that need to be held prior to surgery and how long? ASA    5. Practice name and name of physician performing surgery? DR. Donnal Moat, [place]   6. What is the office phone number? [phone]   7.   What is the office fax number? [phone]  8.   Anesthesia type (None, local, MAC, general) ? LOCAL WITH EPINEPHRINE   Julaine Hua 09/10/2020, 12:46 PM  _________________________________________________________________   (provider comments below)

## 2020-09-10 NOTE — Telephone Encounter (Signed)
error 

## 2020-09-12 DIAGNOSIS — Z951 Presence of aortocoronary bypass graft: Secondary | ICD-10-CM | POA: Diagnosis not present

## 2020-09-15 DIAGNOSIS — Z951 Presence of aortocoronary bypass graft: Secondary | ICD-10-CM | POA: Diagnosis not present

## 2020-09-17 DIAGNOSIS — Z951 Presence of aortocoronary bypass graft: Secondary | ICD-10-CM | POA: Diagnosis not present

## 2020-09-19 ENCOUNTER — Other Ambulatory Visit: Payer: Self-pay | Admitting: Physician Assistant

## 2020-09-19 DIAGNOSIS — Z951 Presence of aortocoronary bypass graft: Secondary | ICD-10-CM | POA: Diagnosis not present

## 2020-09-22 DIAGNOSIS — Z951 Presence of aortocoronary bypass graft: Secondary | ICD-10-CM | POA: Diagnosis not present

## 2020-09-23 DIAGNOSIS — R69 Illness, unspecified: Secondary | ICD-10-CM | POA: Diagnosis not present

## 2020-09-24 DIAGNOSIS — Z951 Presence of aortocoronary bypass graft: Secondary | ICD-10-CM | POA: Diagnosis not present

## 2020-09-26 DIAGNOSIS — Z951 Presence of aortocoronary bypass graft: Secondary | ICD-10-CM | POA: Diagnosis not present

## 2020-09-29 DIAGNOSIS — Z951 Presence of aortocoronary bypass graft: Secondary | ICD-10-CM | POA: Diagnosis not present

## 2020-09-30 DIAGNOSIS — T148XXA Other injury of unspecified body region, initial encounter: Secondary | ICD-10-CM | POA: Diagnosis not present

## 2020-10-01 DIAGNOSIS — Z951 Presence of aortocoronary bypass graft: Secondary | ICD-10-CM | POA: Diagnosis not present

## 2020-10-03 DIAGNOSIS — Z951 Presence of aortocoronary bypass graft: Secondary | ICD-10-CM | POA: Diagnosis not present

## 2020-10-06 NOTE — Progress Notes (Signed)
CARDIOLOGY OFFICE NOTE  Date:  10/20/2020    Dave Robles Date of Birth: Jul 27, 1946 Medical Record #[id number]  PCP:  Dorothyann Peng, NP  Cardiologist:  Servando Snare     Chief Complaint  Patient presents with  . Follow-up    History of Present Illness: Dave Robles is a 74 y.o. male who presents today for a follow up visit. He is a former patient of Dr. Claris Gladden who follows with me.   He has a known history of CAD with original CABG x 3 with LIMA to LAD, SVG to DX and SVG to distal RCA in 1991 per Dr. Redmond Pulling. Other issues includeHTN, HLD, ED, pasttobacco abuse, asthma, AAA (last measured in 10/2019 - 3.4 cm)and depression.He had a non-ST elevationmyocardial infarction in 2016 while vacationing in [place].Cathnoted to have NL left main, 90% prox LAD, 99% mid LAD with patent LIMA to mid LAD and patent SVG to DX. LCX origin stenosis vs flow void? Fistula vs competitive flow in branch?, 100% RCA, SVG to RCA 99% origin treated with Xience DES and PTCA to the distal.  I have followed him for many years. Seen as a work in back in June - noting more DOE which is his anginal equivalent - referred on for cardiac cath that led to consultation for repeat CABG.The patient and his studies were evaluated by Dr. Servando Snare who felt the patient was a candidate for redo CABG.On 05/27/2020 he was taken to the operating room where he underwent redo coronary artery bypass grafting x2. A left radial artery was placed to the PDA, and a free right internal mammary artery was placed to the obtuse marginal.Post op course was eventful for needing inotropic support initially. He did have acute blood loss anemia and required transfusion. Some drop in platelets but HIT negative.I saw him for his first post op visit late July - he was doing well. Did require extended course of diuretics.   I saw him last in August - was feeling better. Was headed to [place] - did rehab there. Has had some bruising -  remains on aspirin and Plavix.   Ended up in the ER over the recent holiday with belly pain - had CT - treated for diverticulitis - CT also showed right pleural effusion - looked larger than what was on prior CXR but this was off abdominal CT - no chest CT. I had EBG look at this - started low dose Lasix - will do this for a few weeks - plan to repeat CXR - consider thoracentesis if needed - he was not symptomatic.   Comes in today. Here alone. Belly feels fine now - had never had this - was treated with antibiotics. Has noted some constipation since his surgery. He is about 1/2 way thru cardiac rehab there in [place]. He feels good. No chest pain. Not short of breath. Very little cough. BP had been trending up - he was on Lisinopril HCT 20-12.5 prior to his surgery - now just on Lisinopril 10 mg a day. Back exercising. He was biking in [place]. Considerable bruising - his CBC there was fine. He is leaving for [place] next Wednesday for 5 days and will then be back in [place] - he will not be back here until March. He started his Lasix about 5 days ago - has lost a few pounds - currently on 20 mg a day.   Past Medical History:  Diagnosis Date  . AAA (abdominal aortic aneurysm) ([place])   .  AAA (abdominal aortic aneurysm) without rupture ([place]) 01/19/2017  . Adjustment disorder with depressed mood 03/04/2009  . Arthritis   . Colon polyps   . CORONARY ARTERY DISEASE 07/10/2007  . Coronary artery disease involving native coronary artery of native heart without angina pectoris 07/10/2007   Qualifier: Diagnosis of  By: Scherrie Gerlach    . Depression   . DOE (dyspnea on exertion)   . Episodic mood disorder ([place]) 08/01/2014  . ERECTILE DYSFUNCTION 07/10/2007  . Extrinsic asthma, unspecified 09/10/2008  . HYPERLIPIDEMIA 07/10/2007  . HYPERTENSION 07/10/2007  . Myocardial infarction ([place]) 2016   when had angioplasty- was told had a silent MI  . OA (osteoarthritis) of hip 04/19/2018  . Onychomycosis 05/14/2014   . Pain in joint of left knee 01/19/2017  . Personal history of colonic polyps 07/08/2014   1997, 2001 8 mm transverse serrated polyp right colon 2010 07/08/2014 no polyps - repeat colon 2022     . S/P CABG x 2 05/27/2020  . TOBACCO USE 12/15/2009    Past Surgical History:  Procedure Laterality Date  . COLONOSCOPY    . CORONARY ANGIOPLASTY  12/2014  . CORONARY ARTERY BYPASS GRAFT  11/15/89   5 vessels  . CORONARY ARTERY BYPASS GRAFT N/A 05/27/2020   Procedure: REDO CORONARY ARTERY BYPASS GRAFTING (CABG), ON PUMP, TIMES TWO, USING RIGHT INTERNAL MAMMARY ARTERY AND LEFT RADIAL ARTERY (OPEN);  Surgeon: Grace Isaac, MD;  Location: [facility];  Service: Open Heart Surgery;  Laterality: N/A;  Left Radial Artery to PDA FREE RIMA to OM  . HYDROCELE EXCISION / REPAIR  2011  . INGUINAL HERNIA REPAIR Left 1985  . LEFT HEART CATH AND CORS/GRAFTS ANGIOGRAPHY N/A 05/13/2020   Procedure: LEFT HEART CATH AND CORS/GRAFTS ANGIOGRAPHY;  Surgeon: Belva Crome, MD;  Location: [facility];  Service: Cardiovascular;  Laterality: N/A;  . MEDIASTERNOTOMY N/A 05/27/2020   Procedure: MEDIAN STERNOTOMY;  Surgeon: Grace Isaac, MD;  Location: [facility];  Service: Open Heart Surgery;  Laterality: N/A;  Redo Median Sternotomy  . RADIAL ARTERY HARVEST Left 05/27/2020   Procedure: RADIAL ARTERY HARVEST;  Surgeon: Grace Isaac, MD;  Location: [facility];  Service: Open Heart Surgery;  Laterality: Left;  . TEE WITHOUT CARDIOVERSION N/A 05/27/2020   Procedure: TRANSESOPHAGEAL ECHOCARDIOGRAM (TEE);  Surgeon: Grace Isaac, MD;  Location: [facility];  Service: Open Heart Surgery;  Laterality: N/A;  . TOTAL HIP ARTHROPLASTY Right 04/19/2018   Procedure: RIGHT TOTAL HIP ARTHROPLASTY ANTERIOR APPROACH;  Surgeon: Gaynelle Arabian, MD;  Location: [facility];  Service: Orthopedics;  Laterality: Right;     Medications: Current Meds  Medication Sig  . acetaminophen (TYLENOL) 325 MG tablet Take 650 mg by mouth every 6 (six) hours as  needed for moderate pain or headache.   Marland Kitchen atorvastatin (LIPITOR) 80 MG tablet TAKE 1 TABLET DAILY  . buPROPion (WELLBUTRIN XL) 300 MG 24 hr tablet Take 1 tablet (300 mg total) by mouth daily.  . calcipotriene (DOVONOX) 0.005 % cream Apply 1 application topically 2 (two) times daily.  . clobetasol cream (TEMOVATE) 1.06 % Apply 1 application topically daily.  . clopidogrel (PLAVIX) 75 MG tablet Take 1 tablet (75 mg total) by mouth daily.  . furosemide (LASIX) 40 MG tablet Take 1 tablet (40 mg total) by mouth as directed. Take a whole tablet for 3 days, then 1/2 tablet daily for total of 2 weeks.  . Melatonin 10 MG TABS Take 20 mg by mouth at bedtime.  Marland Kitchen  metoprolol tartrate (LOPRESSOR) 25 MG tablet Take 1 tablet (25 mg total) by mouth 2 (two) times daily.  . Multiple Vitamin (MULTIVITAMIN) capsule Take 1 capsule by mouth daily.    . nitroGLYCERIN (NITROSTAT) 0.4 MG SL tablet Place 1 tablet (0.4 mg total) under the tongue every 5 (five) minutes as needed.  . potassium chloride SA (KLOR-CON M20) 20 MEQ tablet Take 1 tablet (20 mEq total) by mouth daily.  . sildenafil (VIAGRA) 100 MG tablet Take 100 mg by mouth daily as needed for erectile dysfunction.  . traZODone (DESYREL) 50 MG tablet TAKE 1 TABLET AT BEDTIME ASNEEDED FOR SLEEP  . [DISCONTINUED] aspirin EC 81 MG EC tablet Take 1 tablet (81 mg total) by mouth daily. Swallow whole.  . [DISCONTINUED] lisinopril (ZESTRIL) 10 MG tablet Take 10 mg by mouth daily.     Allergies: No Known Allergies  Social History: The patient  reports that he quit smoking about 30 years ago. He smoked 0.50 packs per day. He has never used smokeless tobacco. He reports current alcohol use of about 1.0 standard drink of alcohol per week. He reports that he does not use drugs.   Family History: The patient's family history includes Cancer in his mother; Coronary artery disease in his father; Depression in his mother and paternal grandfather; Hyperlipidemia in an other  family member; Hypertension in an other family member.   Review of Systems: Please see the history of present illness.   All other systems are reviewed and negative.   Physical Exam: VS:  BP 140/70   Pulse 70   Ht 5\' 10"  (1.778 m)   Wt 212 lb (96.2 kg)   SpO2 96%   BMI 30.42 kg/m  .  BMI Body mass index is 30.42 kg/m.  Wt Readings from Last 3 Encounters:  10/20/20 212 lb (96.2 kg)  10/10/20 211 lb (95.7 kg)  08/07/20 215 lb (97.5 kg)    General: Pleasant. Alert and in no acute distress.   HEENT: Normal.  Neck: Supple, no JVD, carotid bruits, or masses noted.  Cardiac: Regular rate and rhythm. No murmurs, rubs, or gallops. No edema.  Respiratory:  Lungs are clear but he does have diminished breath sounds on the right noted today. Normal work of breathing.  GI: Soft and nontender.  MS: No deformity or atrophy. Gait and ROM intact.  Skin: Warm and dry. Color is normal.  Neuro:  Strength and sensation are intact and no gross focal deficits noted.  Psych: Alert, appropriate and with normal affect.   LABORATORY DATA:  EKG:  EKG is not ordered today.    Lab Results  Component Value Date   WBC 10.0 10/10/2020   HGB 14.8 10/10/2020   HCT 45.5 10/10/2020   PLT 182 10/10/2020   GLUCOSE 107 (H) 10/10/2020   CHOL 92 (L) 07/09/2020   TRIG 89 07/09/2020   HDL 30 (L) 07/09/2020   LDLDIRECT 66.7 11/30/2011   LDLCALC 44 07/09/2020   ALT 23 10/10/2020   AST 24 10/10/2020   NA 138 10/10/2020   K 3.7 10/10/2020   CL 101 10/10/2020   CREATININE 1.13 10/10/2020   BUN 14 10/10/2020   CO2 27 10/10/2020   TSH 2.57 01/10/2020   PSA 1.30 01/10/2020   INR 1.4 (H) 05/28/2020   HGBA1C 5.6 05/23/2020     BNP (last 3 results) No results for input(s): BNP in the last 8760 hours.  ProBNP (last 3 results) No results for input(s): PROBNP in the last 8760  hours.   Other Studies Reviewed Today:  CTA CHEST IMPRESSION 09/2020: 1. Mild likely consistent with very mild colitis  and/or diverticulitis involving the mid to distal descending colon. 2. 3.5 cm x 3.2 cm focal aneurysmal dilatation of the infrarenal abdominal aorta. 3. Large right pleural effusion. 4. Mild-to-moderate severity right basilar atelectasis and/or infiltrate. 5. Total right hip replacement. 6. Aortic atherosclerosis.  Aortic Atherosclerosis (ICD10-I70.0).  Electronically Signed   By: Virgina Norfolk M.D.   On: 10/10/2020 20:33    OPERATIVE REPORT  DATE OF PROCEDURE: 05/27/2020  NAME OF PROCEDURE: Redo coronary artery bypass grafting with free right internal mammary artery to the obtuse marginal and left radial graft to the posterior descending coronary artery with open left radial harvesting  SURGEON: Lanelle Bal, MD  FIRST ASSISTANT: Jadene Pierini, PA    ECHO IMPRESSIONS 05/2020   1. Left ventricular ejection fraction, by estimation, is 60 to 65%. The  left ventricle has normal function. The left ventricle has no regional  wall motion abnormalities. There is moderate left ventricular hypertrophy.  Left ventricular diastolic  parameters were normal.  2. Right ventricular systolic function is mildly reduced. The right  ventricular size is normal. Tricuspid regurgitation signal is inadequate  for assessing PA pressure.  3. Left atrial size was moderately dilated.  4. The mitral valve is normal in structure. Trivial mitral valve  regurgitation.  5. The aortic valve is tricuspid. Aortic valve regurgitation is not  visualized. Mild to moderate aortic valve sclerosis/calcification is  present, without any evidence of aortic stenosis.  6. The inferior vena cava is normal in size with greater than 50%  respiratory variability, suggesting right atrial pressure of 3 mmHg.    Assessment/Plan:  1. Pleural effusion on the right - now on low dose Lasix - he is not symptomatic - he is heading back to [place] - will let him get a CXR there - may need to  consider thoracentesis. BMET on Monday.   2. CAD with redo CABG with free RIMA to OM and left radial to PD - he is actually doing very well. EF is normal. Doing rehab in [place] due to availability.   3. Significant bruising - going to stop his Aspirin and use Plavix as monotherapy.   4. HTN - BP is trending up - increasing Lisinopril to 20 mg a day. Hold on using the HCTZ since he is on Lasix.   5. HLD - on statin.  6. AAA - has had recent CT - needs duplex in one year.   6. Bradycardia - HR ok - no symptoms.   Current medicines are reviewed with the patient today.  The patient does not have concerns regarding medicines other than what has been noted above.  The following changes have been made:  See above.  Labs/ tests ordered today include:    Orders Placed This Encounter  Procedures  . Basic metabolic panel     Disposition:   FU with Dr. Johney Frame in March - he is aware that I am leaving. He does have PCP and cardiology in [place]. Written RX for follow up CXR given. BMET on Monday.   Patient is agreeable to this plan and will call if any problems develop in the interim.   SignedTruitt Merle, NP  10/20/2020 4:28 PM  [facility] [address] [place], [place]  [address] Phone: [phone] Fax: [phone]

## 2020-10-10 ENCOUNTER — Emergency Department (HOSPITAL_BASED_OUTPATIENT_CLINIC_OR_DEPARTMENT_OTHER): Payer: Medicare HMO

## 2020-10-10 ENCOUNTER — Emergency Department (HOSPITAL_BASED_OUTPATIENT_CLINIC_OR_DEPARTMENT_OTHER)
Admission: EM | Admit: 2020-10-10 | Discharge: 2020-10-10 | Disposition: A | Discharge status: Home / Self Care | Payer: Medicare HMO | Attending: Emergency Medicine | Admitting: Emergency Medicine

## 2020-10-10 ENCOUNTER — Encounter (HOSPITAL_BASED_OUTPATIENT_CLINIC_OR_DEPARTMENT_OTHER): Payer: Self-pay

## 2020-10-10 ENCOUNTER — Other Ambulatory Visit: Payer: Self-pay

## 2020-10-10 DIAGNOSIS — Z955 Presence of coronary angioplasty implant and graft: Secondary | ICD-10-CM | POA: Insufficient documentation

## 2020-10-10 DIAGNOSIS — I714 Abdominal aortic aneurysm, without rupture: Secondary | ICD-10-CM | POA: Diagnosis not present

## 2020-10-10 DIAGNOSIS — Z86010 Personal history of colonic polyps: Secondary | ICD-10-CM | POA: Diagnosis not present

## 2020-10-10 DIAGNOSIS — Z96641 Presence of right artificial hip joint: Secondary | ICD-10-CM | POA: Diagnosis not present

## 2020-10-10 DIAGNOSIS — I10 Essential (primary) hypertension: Secondary | ICD-10-CM | POA: Diagnosis not present

## 2020-10-10 DIAGNOSIS — I2510 Atherosclerotic heart disease of native coronary artery without angina pectoris: Secondary | ICD-10-CM | POA: Diagnosis not present

## 2020-10-10 DIAGNOSIS — Z79899 Other long term (current) drug therapy: Secondary | ICD-10-CM | POA: Diagnosis not present

## 2020-10-10 DIAGNOSIS — K5732 Diverticulitis of large intestine without perforation or abscess without bleeding: Secondary | ICD-10-CM | POA: Insufficient documentation

## 2020-10-10 DIAGNOSIS — J90 Pleural effusion, not elsewhere classified: Secondary | ICD-10-CM | POA: Insufficient documentation

## 2020-10-10 DIAGNOSIS — Z951 Presence of aortocoronary bypass graft: Secondary | ICD-10-CM | POA: Diagnosis not present

## 2020-10-10 DIAGNOSIS — J45909 Unspecified asthma, uncomplicated: Secondary | ICD-10-CM | POA: Insufficient documentation

## 2020-10-10 DIAGNOSIS — I708 Atherosclerosis of other arteries: Secondary | ICD-10-CM | POA: Diagnosis not present

## 2020-10-10 DIAGNOSIS — K5792 Diverticulitis of intestine, part unspecified, without perforation or abscess without bleeding: Secondary | ICD-10-CM

## 2020-10-10 DIAGNOSIS — Z7982 Long term (current) use of aspirin: Secondary | ICD-10-CM | POA: Diagnosis not present

## 2020-10-10 DIAGNOSIS — Z87891 Personal history of nicotine dependence: Secondary | ICD-10-CM | POA: Insufficient documentation

## 2020-10-10 DIAGNOSIS — R1032 Left lower quadrant pain: Secondary | ICD-10-CM | POA: Diagnosis not present

## 2020-10-10 DIAGNOSIS — N281 Cyst of kidney, acquired: Secondary | ICD-10-CM | POA: Diagnosis not present

## 2020-10-10 DIAGNOSIS — N400 Enlarged prostate without lower urinary tract symptoms: Secondary | ICD-10-CM | POA: Diagnosis not present

## 2020-10-10 LAB — URINALYSIS, ROUTINE W REFLEX MICROSCOPIC
Bilirubin Urine: NEGATIVE
Glucose, UA: NEGATIVE mg/dL
Ketones, ur: NEGATIVE mg/dL
Leukocytes,Ua: NEGATIVE
Nitrite: NEGATIVE
Protein, ur: NEGATIVE mg/dL
Specific Gravity, Urine: 1.015 (ref 1.005–1.030)
pH: 8 (ref 5.0–8.0)

## 2020-10-10 LAB — URINALYSIS, MICROSCOPIC (REFLEX)
Squamous Epithelial / HPF: NONE SEEN (ref 0–5)
WBC, UA: NONE SEEN WBC/hpf (ref 0–5)

## 2020-10-10 LAB — COMPREHENSIVE METABOLIC PANEL
ALT: 23 U/L (ref 0–44)
AST: 24 U/L (ref 15–41)
Albumin: 3.9 g/dL (ref 3.5–5.0)
Alkaline Phosphatase: 103 U/L (ref 38–126)
Anion gap: 10 (ref 5–15)
BUN: 14 mg/dL (ref 8–23)
CO2: 27 mmol/L (ref 22–32)
Calcium: 9.1 mg/dL (ref 8.9–10.3)
Chloride: 101 mmol/L (ref 98–111)
Creatinine, Ser: 1.13 mg/dL (ref 0.61–1.24)
GFR, Estimated: >60 mL/min (ref >60)
Glucose, Bld: 107 mg/dL — ABNORMAL HIGH (ref 70–99)
Potassium: 3.7 mmol/L (ref 3.5–5.1)
Sodium: 138 mmol/L (ref 135–145)
Total Bilirubin: 1 mg/dL (ref 0.3–1.2)
Total Protein: 7.4 g/dL (ref 6.5–8.1)

## 2020-10-10 LAB — CBC
HCT: 45.5 % (ref 39.0–52.0)
Hemoglobin: 14.8 g/dL (ref 13.0–17.0)
MCH: 28.3 pg (ref 26.0–34.0)
MCHC: 32.5 g/dL (ref 30.0–36.0)
MCV: 87 fL (ref 80.0–100.0)
Platelets: 182 10*3/uL (ref 150–400)
RBC: 5.23 MIL/uL (ref 4.22–5.81)
RDW: 17.4 % — ABNORMAL HIGH (ref 11.5–15.5)
WBC: 10 10*3/uL (ref 4.0–10.5)
nRBC: 0 % (ref 0.0–0.2)

## 2020-10-10 LAB — LIPASE, BLOOD: Lipase: 39 U/L (ref 11–51)

## 2020-10-10 MED ORDER — CIPROFLOXACIN HCL 500 MG PO TABS: 500.0000 mg | ORAL_TABLET | Freq: Two times a day (BID) | ORAL | 0 refills | Status: DC

## 2020-10-10 MED ORDER — METRONIDAZOLE 500 MG PO TABS
500.0000 mg | ORAL_TABLET | Freq: Once | ORAL | Status: AC
  Administered 2020-10-10: 500 mg via ORAL
  Filled 2020-10-10: qty 1

## 2020-10-10 MED ORDER — METOPROLOL TARTRATE 5 MG/5ML IV SOLN
5.0000 mg | Freq: Once | INTRAVENOUS | Status: AC
  Administered 2020-10-10: 5 mg via INTRAVENOUS
  Filled 2020-10-10: qty 5

## 2020-10-10 MED ORDER — METRONIDAZOLE 500 MG PO TABS: 500.0000 mg | ORAL_TABLET | Freq: Two times a day (BID) | ORAL | 0 refills | Status: DC

## 2020-10-10 MED ORDER — METOPROLOL TARTRATE 25 MG PO TABS
25.0000 mg | ORAL_TABLET | Freq: Once | ORAL | Status: AC
  Administered 2020-10-10: 25 mg via ORAL
  Filled 2020-10-10: qty 1

## 2020-10-10 MED ORDER — CIPROFLOXACIN HCL 500 MG PO TABS
500.0000 mg | ORAL_TABLET | Freq: Once | ORAL | Status: AC
  Administered 2020-10-10: 500 mg via ORAL
  Filled 2020-10-10: qty 1

## 2020-10-10 NOTE — Discharge Instructions (Addendum)
Your CT scan shows a small area of inflammation suggestive of diverticulitis.  Combined with symptoms, I will opt to treat with antibiotics.  You have been prescribed Flagyl and ciprofloxacin.  You were given your first dose in the emergency department.  Fill your prescription tomorrow and continue taking twice daily. Your CT scan identified an incidental finding which is fluid called a pleural effusion around the right lung.  When you see your cardiologist, make them aware of this finding.  Return to the emergency department if you get short of breath, fever or chest pain.

## 2020-10-10 NOTE — ED Notes (Signed)
Patient transported to CT 

## 2020-10-10 NOTE — ED Triage Notes (Signed)
LLQ pain. Pt states he was bloated and constipated this week, after taking milk of magnesia the constipation was relieved but he steel feels bloated. Pt reports 2 weeks ago he noted blood in his stool.

## 2020-10-10 NOTE — ED Provider Notes (Signed)
[facility] EMERGENCY DEPARTMENT Provider Note   CSN: [id number] Arrival date & time: 10/10/20  1827     History Chief Complaint  Patient presents with  . Abdominal Pain    Dave Robles is a 74 y.o. male.  HPI Patient reports he periodically gets some constipation.  He noted over the past week that he had not had a regular bowel movement.  He started to feel that his abdomen was slightly distended.  He started experiencing some left lower abdominal pain that was at times sharp.  Patient reports that he did take some milk of magnesia and had a bowel movement 2 days ago.  He reports that did not relieve the pain.  He noted over the past 2 days of the pain in the left lower quadrant seem to get more sharp and frequent.  He also noted that sometimes it was brought on by position.  Reports when he did have a bowel movement about 2 days ago there was some blood in it.  He has not had diarrhea.  No pain burning urgency urination.  No nausea no vomiting.  No chest pain or shortness of breath.  Patient has known history of stable aortic aneurysm that has been monitored by vascular ultrasound annually.  He advises there was no change in size from last year to the next.  Pain has not been sudden or progressive.  No radiation into the legs.  Patient reports he had diverticulitis about 10 years ago.  He reports he was treated with antibiotics.  He had wondered if he might have diverticulitis again.    Past Medical History:  Diagnosis Date  . AAA (abdominal aortic aneurysm) ([place])   . AAA (abdominal aortic aneurysm) without rupture ([place]) 01/19/2017  . Adjustment disorder with depressed mood 03/04/2009  . Arthritis   . Colon polyps   . CORONARY ARTERY DISEASE 07/10/2007  . Coronary artery disease involving native coronary artery of native heart without angina pectoris 07/10/2007   Qualifier: Diagnosis of  By: Scherrie Gerlach    . Depression   . DOE (dyspnea on exertion)   . Episodic mood  disorder ([place]) 08/01/2014  . ERECTILE DYSFUNCTION 07/10/2007  . Extrinsic asthma, unspecified 09/10/2008  . HYPERLIPIDEMIA 07/10/2007  . HYPERTENSION 07/10/2007  . Myocardial infarction ([place]) 2016   when had angioplasty- was told had a silent MI  . OA (osteoarthritis) of hip 04/19/2018  . Onychomycosis 05/14/2014  . Pain in joint of left knee 01/19/2017  . Personal history of colonic polyps 07/08/2014   1997, 2001 8 mm transverse serrated polyp right colon 2010 07/08/2014 no polyps - repeat colon 2022     . S/P CABG x 2 05/27/2020  . TOBACCO USE 12/15/2009    Patient Active Problem List   Diagnosis Date Noted  . S/P CABG x 2 05/27/2020  . DOE (dyspnea on exertion)   . OA (osteoarthritis) of hip 04/19/2018  . AAA (abdominal aortic aneurysm) without rupture ([place]) 01/19/2017  . Pain in joint of left knee 01/19/2017  . Episodic mood disorder ([place]) 08/01/2014  . Personal history of colonic polyps 07/08/2014  . Onychomycosis 05/14/2014  . Hyperlipidemia 07/10/2007  . ERECTILE DYSFUNCTION 07/10/2007  . Essential hypertension 07/10/2007  . Coronary artery disease involving native coronary artery of native heart without angina pectoris 07/10/2007    Past Surgical History:  Procedure Laterality Date  . COLONOSCOPY    . CORONARY ANGIOPLASTY  12/2014  . CORONARY ARTERY BYPASS GRAFT  11/15/89  5 vessels  . CORONARY ARTERY BYPASS GRAFT N/A 05/27/2020   Procedure: REDO CORONARY ARTERY BYPASS GRAFTING (CABG), ON PUMP, TIMES TWO, USING RIGHT INTERNAL MAMMARY ARTERY AND LEFT RADIAL ARTERY (OPEN);  Surgeon: Grace Isaac, MD;  Location: [facility];  Service: Open Heart Surgery;  Laterality: N/A;  Left Radial Artery to PDA FREE RIMA to OM  . HYDROCELE EXCISION / REPAIR  2011  . INGUINAL HERNIA REPAIR Left 1985  . LEFT HEART CATH AND CORS/GRAFTS ANGIOGRAPHY N/A 05/13/2020   Procedure: LEFT HEART CATH AND CORS/GRAFTS ANGIOGRAPHY;  Surgeon: Belva Crome, MD;  Location: [facility];  Service:  Cardiovascular;  Laterality: N/A;  . MEDIASTERNOTOMY N/A 05/27/2020   Procedure: MEDIAN STERNOTOMY;  Surgeon: Grace Isaac, MD;  Location: [facility];  Service: Open Heart Surgery;  Laterality: N/A;  Redo Median Sternotomy  . RADIAL ARTERY HARVEST Left 05/27/2020   Procedure: RADIAL ARTERY HARVEST;  Surgeon: Grace Isaac, MD;  Location: [facility];  Service: Open Heart Surgery;  Laterality: Left;  . TEE WITHOUT CARDIOVERSION N/A 05/27/2020   Procedure: TRANSESOPHAGEAL ECHOCARDIOGRAM (TEE);  Surgeon: Grace Isaac, MD;  Location: [facility];  Service: Open Heart Surgery;  Laterality: N/A;  . TOTAL HIP ARTHROPLASTY Right 04/19/2018   Procedure: RIGHT TOTAL HIP ARTHROPLASTY ANTERIOR APPROACH;  Surgeon: Gaynelle Arabian, MD;  Location: [facility];  Service: Orthopedics;  Laterality: Right;       Family History  Problem Relation Age of Onset  . Coronary artery disease Father        cabg  . Cancer Mother        Breast and or lung   . Depression Mother   . Hyperlipidemia Other   . Hypertension Other   . Depression Paternal Grandfather   . Colon cancer Neg Hx     Social History   Tobacco Use  . Smoking status: Former Smoker    Packs/day: 0.50    Quit date: 1991    Years since quitting: 30.9  . Smokeless tobacco: Never Used  Vaping Use  . Vaping Use: Never used  Substance Use Topics  . Alcohol use: Yes    Alcohol/week: 1.0 standard drink    Types: 1 Cans of beer per week    Comment: Gin 2 x week  . Drug use: No    Home Medications Prior to Admission medications   Medication Sig Start Date End Date Taking? Authorizing Provider  acetaminophen (TYLENOL) 325 MG tablet Take 650 mg by mouth every 6 (six) hours as needed for moderate pain or headache.     [provider]  aspirin EC 81 MG EC tablet Take 1 tablet (81 mg total) by mouth daily. Swallow whole. 06/02/20   Antony Odea, PA-C  atorvastatin (LIPITOR) 80 MG tablet TAKE 1 TABLET DAILY 01/10/20   Nafziger, Tommi Rumps, NP   buPROPion (WELLBUTRIN XL) 300 MG 24 hr tablet Take 1 tablet (300 mg total) by mouth daily. 05/20/20 08/18/20  Nafziger, Tommi Rumps, NP  calcipotriene (DOVONOX) 0.005 % cream Apply 1 application topically 2 (two) times daily.    [provider]  ciprofloxacin (CIPRO) 500 MG tablet Take 1 tablet (500 mg total) by mouth 2 (two) times daily. One po bid x 7 days 10/10/20   Charlesetta Shanks, MD  clobetasol cream (TEMOVATE) 1.61 % Apply 1 application topically daily.    [provider]  clopidogrel (PLAVIX) 75 MG tablet Take 1 tablet (75 mg total) by mouth daily. 01/10/20   Nafziger, Tommi Rumps,  NP  lisinopril-hydrochlorothiazide (ZESTORETIC) 10-12.5 MG tablet Take 1 tablet by mouth daily. 07/09/20   [name], NP  Melatonin 10 MG TABS Take 20 mg by mouth at bedtime.    [provider]  metoprolol tartrate (LOPRESSOR) 25 MG tablet Take 1 tablet (25 mg total) by mouth 2 (two) times daily. 01/10/20   Nafziger, Tommi Rumps, NP  metroNIDAZOLE (FLAGYL) 500 MG tablet Take 1 tablet (500 mg total) by mouth 2 (two) times daily. One po bid x 7 days 10/10/20   Charlesetta Shanks, MD  Multiple Vitamin (MULTIVITAMIN) capsule Take 1 capsule by mouth daily.      [provider]  nitroGLYCERIN (NITROSTAT) 0.4 MG SL tablet Place 1 tablet (0.4 mg total) under the tongue every 5 (five) minutes as needed. 10/09/19   Nafziger, Tommi Rumps, NP  sildenafil (VIAGRA) 100 MG tablet Take 100 mg by mouth daily as needed for erectile dysfunction.    [provider]  traZODone (DESYREL) 50 MG tablet TAKE 1 TABLET AT BEDTIME ASNEEDED FOR SLEEP 07/31/20   Dorothyann Peng, NP    Allergies    Patient has no known allergies.  Review of Systems   Review of Systems 10 systems reviewed and negative except as per HPI Physical Exam Updated Vital Signs BP (!) 170/91   Pulse 69   Temp 99 F (37.2 C) (Oral)   Resp 17   Ht 5\' 10"  (1.778 m)   Wt 95.7 kg   SpO2 99%   BMI 30.28 kg/m   Physical Exam Constitutional:       Appearance: He is well-developed.  HENT:     Head: Normocephalic and atraumatic.     Mouth/Throat:     Pharynx: Oropharynx is clear.  Eyes:     Extraocular Movements: Extraocular movements intact.  Cardiovascular:     Rate and Rhythm: Normal rate and regular rhythm.     Heart sounds: Normal heart sounds.  Pulmonary:     Effort: Pulmonary effort is normal.     Breath sounds: Normal breath sounds.     Comments: Well-healed scars on anterior chest wall from prior CABG. Abdominal:     General: Bowel sounds are normal. There is no distension.     Palpations: Abdomen is soft.     Tenderness: There is abdominal tenderness.     Comments: Mild tenderness in the left lower quadrant.  No guarding.  remainder of abdomen nontender.  Musculoskeletal:        General: Normal range of motion.     Cervical back: Neck supple.     Comments: Slight asymmetric edematous swelling of the left lower extremity.  Patient reports that has been chronic since his vein graft harvest.  No tenderness to palpation of the calves.  Skin:    General: Skin is warm and dry.  Neurological:     General: No focal deficit present.     Mental Status: He is alert and oriented to person, place, and time.     GCS: GCS eye subscore is 4. GCS verbal subscore is 5. GCS motor subscore is 6.     Cranial Nerves: No cranial nerve deficit.     Coordination: Coordination normal.  Psychiatric:        Mood and Affect: Mood normal.     ED Results / Procedures / Treatments   Labs (all labs ordered are listed, but only abnormal results are displayed) Labs Reviewed  COMPREHENSIVE METABOLIC PANEL - Abnormal; Notable for the following components:  Result Value   Glucose, Bld 107 (*)    All other components within normal limits  CBC - Abnormal; Notable for the following components:   RDW 17.4 (*)    All other components within normal limits  URINALYSIS, ROUTINE W REFLEX MICROSCOPIC - Abnormal; Notable for the following  components:   APPearance HAZY (*)    Hgb urine dipstick SMALL (*)    All other components within normal limits  URINALYSIS, MICROSCOPIC (REFLEX) - Abnormal; Notable for the following components:   Bacteria, UA FEW (*)    All other components within normal limits  LIPASE, BLOOD    EKG None  Radiology CT Abdomen Pelvis Wo Contrast  Result Date: 10/10/2020 CLINICAL DATA:  Left lower quadrant pain. EXAM: CT ABDOMEN AND PELVIS WITHOUT CONTRAST TECHNIQUE: Multidetector CT imaging of the abdomen and pelvis was performed following the standard protocol without IV contrast. COMPARISON:  None. FINDINGS: Lower chest: Multiple sternal wires are seen. Mild-to-moderate severity atelectasis and/or infiltrate is seen within the right lung base. There is a large right pleural effusion. Hepatobiliary: No focal liver abnormality is seen. No gallstones, gallbladder wall thickening, or biliary dilatation. Pancreas: Unremarkable. No pancreatic ductal dilatation or surrounding inflammatory changes. Spleen: Normal in size without focal abnormality. Adrenals/Urinary Tract: Adrenal glands are unremarkable. The right kidney is mildly larger in size than the left kidney. A 9 mm exophytic cyst is seen along the anterolateral aspect of the mid left kidney. Cortical scarring is seen along the lateral aspects of the mid and upper left kidney. There is no evidence of hydronephrosis or renal calculi. Bladder is unremarkable. Stomach/Bowel: Stomach is within normal limits. Appendix appears normal. No evidence of bowel dilatation. Numerous diverticula are seen throughout the large bowel. Very mild inflammatory fat stranding is seen adjacent to the lateral aspect of the mid descending colon. Vascular/Lymphatic: There is marked severity calcification of the abdominal aorta and bilateral common iliac arteries. 3.5 cm x 3.2 cm focal aneurysmal dilatation of the infrarenal abdominal aorta is seen. No enlarged abdominal or pelvic lymph  nodes. Reproductive: There is moderate to marked severity prostate gland enlargement. Other: No abdominal wall hernia or abnormality. No abdominopelvic ascites. Musculoskeletal: A total right hip replacement is seen. Associated streak artifact is noted with subsequently limited evaluation of the adjacent osseous and soft tissue structures. Multilevel degenerative changes seen throughout the lumbar spine. IMPRESSION: 1. Mild likely consistent with very mild colitis and/or diverticulitis involving the mid to distal descending colon. 2. 3.5 cm x 3.2 cm focal aneurysmal dilatation of the infrarenal abdominal aorta. 3. Large right pleural effusion. 4. Mild-to-moderate severity right basilar atelectasis and/or infiltrate. 5. Total right hip replacement. 6. Aortic atherosclerosis. Aortic Atherosclerosis (ICD10-I70.0). Electronically Signed   By: Virgina Norfolk M.D.   On: 10/10/2020 20:33    Procedures Procedures (including critical care time)  Medications Ordered in ED Medications  metoprolol tartrate (LOPRESSOR) injection 5 mg (5 mg Intravenous Given 10/10/20 2018)  ciprofloxacin (CIPRO) tablet 500 mg (500 mg Oral Given 10/10/20 2103)  metroNIDAZOLE (FLAGYL) tablet 500 mg (500 mg Oral Given 10/10/20 2103)  metoprolol tartrate (LOPRESSOR) tablet 25 mg (25 mg Oral Given 10/10/20 2102)    ED Course  I have reviewed the triage vital signs and the nursing notes.  Pertinent labs & imaging results that were available during my care of the patient were reviewed by me and considered in my medical decision making (see chart for details).    MDM Rules/Calculators/A&P  Patient has been perceived abdominal bloating and has begun to note left lower abdominal pain.  He had one stool with some blood on it several days ago.  At this time, with CT scan showing inflammatory change suggestive of early diverticulitis, with clinical picture will opt to treat with antibiotics.  Patient given  ciprofloxacin and Flagyl.  He is nontoxic with nonsurgical abdomen.  Tolerating oral intake without difficulty.  Incidental finding of right pleural effusion.  Patient had CABG July of this year.  This may be surgical sequelae.  Patient does not seem symptomatic.  He has no complaints of chest pain or shortness of breath.  No fever no cough.  Patient does not have significant peripheral edema.  At this time, patient counseled to follow-up ASAP with cardiology for further evaluation of incidental pleural effusion. Final Clinical Impression(s) / ED Diagnoses Final diagnoses:  Diverticulitis  Pleural effusion    Rx / DC Orders ED Discharge Orders         Ordered    ciprofloxacin (CIPRO) 500 MG tablet  2 times daily        10/10/20 2210    metroNIDAZOLE (FLAGYL) 500 MG tablet  2 times daily        10/10/20 2210           Charlesetta Shanks, MD 10/10/20 2215

## 2020-10-11 ENCOUNTER — Telehealth (HOSPITAL_BASED_OUTPATIENT_CLINIC_OR_DEPARTMENT_OTHER): Payer: Self-pay | Admitting: Emergency Medicine

## 2020-10-11 NOTE — Telephone Encounter (Signed)
Pt's pharmacy called.  They do not have Cipro.  I d/c'd Cipro and Flagyl and ordered Augmentin bid for 7 days.

## 2020-10-12 ENCOUNTER — Encounter: Payer: Self-pay | Admitting: Adult Health

## 2020-10-12 ENCOUNTER — Other Ambulatory Visit: Payer: Self-pay | Admitting: Physician Assistant

## 2020-10-13 ENCOUNTER — Other Ambulatory Visit: Payer: Self-pay | Admitting: Nurse Practitioner

## 2020-10-13 MED ORDER — POTASSIUM CHLORIDE CRYS ER 20 MEQ PO TBCR: 20.0000 meq | EXTENDED_RELEASE_TABLET | Freq: Every day | ORAL | 0 refills | Status: DC

## 2020-10-13 MED ORDER — FUROSEMIDE 40 MG PO TABS: 40.0000 mg | ORAL_TABLET | ORAL | 0 refills | Status: DC

## 2020-10-15 DIAGNOSIS — M1711 Unilateral primary osteoarthritis, right knee: Secondary | ICD-10-CM | POA: Diagnosis not present

## 2020-10-20 ENCOUNTER — Ambulatory Visit: Payer: Medicare HMO | Admitting: Nurse Practitioner

## 2020-10-20 ENCOUNTER — Encounter: Payer: Self-pay | Admitting: Nurse Practitioner

## 2020-10-20 ENCOUNTER — Other Ambulatory Visit: Payer: Self-pay

## 2020-10-20 VITALS — BP 140/70 | HR 70 | Ht 70.0 in | Wt 212.0 lb

## 2020-10-20 DIAGNOSIS — I2510 Atherosclerotic heart disease of native coronary artery without angina pectoris: Secondary | ICD-10-CM | POA: Diagnosis not present

## 2020-10-20 DIAGNOSIS — I714 Abdominal aortic aneurysm, without rupture, unspecified: Secondary | ICD-10-CM

## 2020-10-20 DIAGNOSIS — E7800 Pure hypercholesterolemia, unspecified: Secondary | ICD-10-CM | POA: Diagnosis not present

## 2020-10-20 DIAGNOSIS — J90 Pleural effusion, not elsewhere classified: Secondary | ICD-10-CM | POA: Diagnosis not present

## 2020-10-20 DIAGNOSIS — I10 Essential (primary) hypertension: Secondary | ICD-10-CM

## 2020-10-20 MED ORDER — LISINOPRIL 20 MG PO TABS: 20.0000 mg | ORAL_TABLET | Freq: Every day | ORAL | 3 refills | Status: DC

## 2020-10-20 NOTE — Patient Instructions (Addendum)
After Visit Summary:  We will be checking the following labs today - NONE  BMET on Monday   Medication Instructions:    Continue with your current medicines. BUT  I am increasing the Lisinopril to 20 mg a day - this has been sent in for you.   Stay on the Lasix til you get back to [place]  STOP aspirin   If you need a refill on your cardiac medications before your next appointment, please call your pharmacy.     Testing/Procedures To Be Arranged:  CXR when you get to [place]  We are cancelling the duplex scan - will plan to recheck in one year.   Follow-Up:   See Dr. Johney Frame in March - new patient    At [facility], you and your health needs are our priority.  As part of our continuing mission to provide you with exceptional heart care, we have created designated Provider Care Teams.  These Care Teams include your primary Cardiologist (physician) and Advanced Practice Providers (APPs -  Physician Assistants and Nurse Practitioners) who all work together to provide you with the care you need, when you need it.  Special Instructions:  . Stay safe, wash your hands for at least 20 seconds and wear a mask when needed.  . It was good to talk with you today.  . Continue to keep a check on your BP for [place].    Call the [facility] office at [phone] if you have any questions, problems or concerns.

## 2020-10-21 ENCOUNTER — Ambulatory Visit (HOSPITAL_COMMUNITY): Payer: Medicare HMO

## 2020-10-22 DIAGNOSIS — L570 Actinic keratosis: Secondary | ICD-10-CM | POA: Diagnosis not present

## 2020-10-22 DIAGNOSIS — L578 Other skin changes due to chronic exposure to nonionizing radiation: Secondary | ICD-10-CM | POA: Diagnosis not present

## 2020-10-23 ENCOUNTER — Ambulatory Visit: Payer: Medicare HMO | Admitting: Adult Health

## 2020-10-27 ENCOUNTER — Other Ambulatory Visit: Payer: Self-pay

## 2020-10-27 ENCOUNTER — Other Ambulatory Visit: Payer: Medicare HMO | Admitting: *Deleted

## 2020-10-27 DIAGNOSIS — J90 Pleural effusion, not elsewhere classified: Secondary | ICD-10-CM | POA: Diagnosis not present

## 2020-10-27 LAB — BASIC METABOLIC PANEL
BUN/Creatinine Ratio: 10 (ref 10–24)
BUN: 12 mg/dL (ref 8–27)
CO2: 24 mmol/L (ref 20–29)
Calcium: 9.6 mg/dL (ref 8.6–10.2)
Chloride: 104 mmol/L (ref 96–106)
Creatinine, Ser: 1.23 mg/dL (ref 0.76–1.27)
GFR calc Af Amer: 67 mL/min/{1.73_m2} (ref >59)
GFR calc non Af Amer: 58 mL/min/{1.73_m2} — ABNORMAL LOW (ref >59)
Glucose: 95 mg/dL (ref 65–99)
Potassium: 4.3 mmol/L (ref 3.5–5.2)
Sodium: 141 mmol/L (ref 134–144)

## 2020-10-29 ENCOUNTER — Ambulatory Visit: Payer: Medicare HMO | Admitting: Nurse Practitioner

## 2020-11-04 DIAGNOSIS — J90 Pleural effusion, not elsewhere classified: Secondary | ICD-10-CM | POA: Diagnosis not present

## 2020-11-05 DIAGNOSIS — Z951 Presence of aortocoronary bypass graft: Secondary | ICD-10-CM | POA: Diagnosis not present

## 2020-11-10 DIAGNOSIS — Z951 Presence of aortocoronary bypass graft: Secondary | ICD-10-CM | POA: Diagnosis not present

## 2020-11-11 ENCOUNTER — Ambulatory Visit (HOSPITAL_COMMUNITY)
Admission: RE | Admit: 2020-11-11 | Payer: Medicare HMO | Source: Ambulatory Visit | Attending: Nurse Practitioner | Admitting: Nurse Practitioner

## 2020-11-12 DIAGNOSIS — Z951 Presence of aortocoronary bypass graft: Secondary | ICD-10-CM | POA: Diagnosis not present

## 2020-11-17 DIAGNOSIS — Z951 Presence of aortocoronary bypass graft: Secondary | ICD-10-CM | POA: Diagnosis not present

## 2020-11-19 DIAGNOSIS — Z951 Presence of aortocoronary bypass graft: Secondary | ICD-10-CM | POA: Diagnosis not present

## 2020-11-21 DIAGNOSIS — Z951 Presence of aortocoronary bypass graft: Secondary | ICD-10-CM | POA: Diagnosis not present

## 2020-11-23 ENCOUNTER — Other Ambulatory Visit: Payer: Self-pay | Admitting: Physician Assistant

## 2020-11-23 ENCOUNTER — Encounter: Payer: Self-pay | Admitting: Adult Health

## 2020-11-23 DIAGNOSIS — F339 Major depressive disorder, recurrent, unspecified: Secondary | ICD-10-CM

## 2020-11-24 ENCOUNTER — Other Ambulatory Visit: Payer: Self-pay | Admitting: *Deleted

## 2020-11-24 DIAGNOSIS — Z951 Presence of aortocoronary bypass graft: Secondary | ICD-10-CM | POA: Diagnosis not present

## 2020-11-24 NOTE — Telephone Encounter (Signed)
S/w pt with [name], NP recommendations, Pt is not to get refill on imdur per Cecille Rubin, pt was taken off this medication.  Will send script today to [facility], [id number], [place] [address] for CXR, dx pleural effusion. Pt's medication list updated.

## 2020-11-25 MED ORDER — BUPROPION HCL ER (XL) 300 MG PO TB24: 300.0000 mg | ORAL_TABLET | Freq: Every day | ORAL | 0 refills | Status: DC

## 2020-11-25 NOTE — Telephone Encounter (Signed)
The pt needs to be scheduled for a cpx.  Left a message for a return call.

## 2020-11-26 DIAGNOSIS — Z951 Presence of aortocoronary bypass graft: Secondary | ICD-10-CM | POA: Diagnosis not present

## 2020-11-28 DIAGNOSIS — Z951 Presence of aortocoronary bypass graft: Secondary | ICD-10-CM | POA: Diagnosis not present

## 2020-12-02 ENCOUNTER — Telehealth: Payer: Self-pay | Admitting: Nurse Practitioner

## 2020-12-02 NOTE — Telephone Encounter (Signed)
Patient called and said he was sent a bill for a no show fee for a AAA Duplex that was scheduled for 11/11/20.  Patient said he had a similar test done and [name] and Andee Poles got the results they needed and were supposed to cancel this test for him.  Patient also said his wife called about the bill last week and had not heard any resolution about it.  Please assist

## 2020-12-03 DIAGNOSIS — Z951 Presence of aortocoronary bypass graft: Secondary | ICD-10-CM | POA: Diagnosis not present

## 2020-12-05 DIAGNOSIS — Z951 Presence of aortocoronary bypass graft: Secondary | ICD-10-CM | POA: Diagnosis not present

## 2020-12-08 DIAGNOSIS — Z951 Presence of aortocoronary bypass graft: Secondary | ICD-10-CM | POA: Diagnosis not present

## 2020-12-10 DIAGNOSIS — Z951 Presence of aortocoronary bypass graft: Secondary | ICD-10-CM | POA: Diagnosis not present

## 2020-12-12 DIAGNOSIS — Z951 Presence of aortocoronary bypass graft: Secondary | ICD-10-CM | POA: Diagnosis not present

## 2020-12-15 DIAGNOSIS — Z951 Presence of aortocoronary bypass graft: Secondary | ICD-10-CM | POA: Diagnosis not present

## 2020-12-17 DIAGNOSIS — Z951 Presence of aortocoronary bypass graft: Secondary | ICD-10-CM | POA: Diagnosis not present

## 2020-12-19 DIAGNOSIS — Z951 Presence of aortocoronary bypass graft: Secondary | ICD-10-CM | POA: Diagnosis not present

## 2020-12-22 DIAGNOSIS — Z951 Presence of aortocoronary bypass graft: Secondary | ICD-10-CM | POA: Diagnosis not present

## 2020-12-23 DIAGNOSIS — J918 Pleural effusion in other conditions classified elsewhere: Secondary | ICD-10-CM | POA: Diagnosis not present

## 2020-12-24 DIAGNOSIS — Z951 Presence of aortocoronary bypass graft: Secondary | ICD-10-CM | POA: Diagnosis not present

## 2020-12-29 ENCOUNTER — Telehealth: Payer: Self-pay | Admitting: Nurse Practitioner

## 2020-12-29 NOTE — Telephone Encounter (Signed)
Follow up CXR sent to [place] - reviewed by [facility] -    Grace Isaac, MD sent to [name], NP Very small right effusion does not need to do anything   EBG    I have sent Jerrye Beavers a My Chart message - no further action needed.   [name], RN, [place] [address] [place], [place]  [phone]

## 2020-12-31 DIAGNOSIS — Z951 Presence of aortocoronary bypass graft: Secondary | ICD-10-CM | POA: Diagnosis not present

## 2021-01-02 DIAGNOSIS — Z951 Presence of aortocoronary bypass graft: Secondary | ICD-10-CM | POA: Diagnosis not present

## 2021-01-05 DIAGNOSIS — I119 Hypertensive heart disease without heart failure: Secondary | ICD-10-CM | POA: Diagnosis not present

## 2021-01-05 DIAGNOSIS — Z951 Presence of aortocoronary bypass graft: Secondary | ICD-10-CM | POA: Diagnosis not present

## 2021-01-05 DIAGNOSIS — E782 Mixed hyperlipidemia: Secondary | ICD-10-CM | POA: Diagnosis not present

## 2021-01-05 DIAGNOSIS — I2510 Atherosclerotic heart disease of native coronary artery without angina pectoris: Secondary | ICD-10-CM | POA: Diagnosis not present

## 2021-01-05 DIAGNOSIS — I714 Abdominal aortic aneurysm, without rupture: Secondary | ICD-10-CM | POA: Diagnosis not present

## 2021-01-05 DIAGNOSIS — I252 Old myocardial infarction: Secondary | ICD-10-CM | POA: Diagnosis not present

## 2021-01-07 DIAGNOSIS — Z951 Presence of aortocoronary bypass graft: Secondary | ICD-10-CM | POA: Diagnosis not present

## 2021-01-09 DIAGNOSIS — Z951 Presence of aortocoronary bypass graft: Secondary | ICD-10-CM | POA: Diagnosis not present

## 2021-01-11 DIAGNOSIS — E782 Mixed hyperlipidemia: Secondary | ICD-10-CM | POA: Diagnosis not present

## 2021-01-11 DIAGNOSIS — I2510 Atherosclerotic heart disease of native coronary artery without angina pectoris: Secondary | ICD-10-CM | POA: Diagnosis not present

## 2021-01-11 DIAGNOSIS — Z951 Presence of aortocoronary bypass graft: Secondary | ICD-10-CM | POA: Diagnosis not present

## 2021-01-11 DIAGNOSIS — I714 Abdominal aortic aneurysm, without rupture: Secondary | ICD-10-CM | POA: Diagnosis not present

## 2021-01-11 DIAGNOSIS — I119 Hypertensive heart disease without heart failure: Secondary | ICD-10-CM | POA: Diagnosis not present

## 2021-01-11 DIAGNOSIS — I252 Old myocardial infarction: Secondary | ICD-10-CM | POA: Diagnosis not present

## 2021-01-12 DIAGNOSIS — Z951 Presence of aortocoronary bypass graft: Secondary | ICD-10-CM | POA: Diagnosis not present

## 2021-01-20 ENCOUNTER — Ambulatory Visit: Payer: Medicare HMO | Admitting: Cardiology

## 2021-01-28 NOTE — Progress Notes (Signed)
Subjective:   Dave Robles is a 75 y.o. male who presents for an Initial Medicare Annual Wellness Visit.  Review of Systems    N/A  Cardiac Risk Factors include: advanced age (>103men, >28 women);male gender     Objective:    Today's Vitals   01/29/21 0832  BP: 130/70  Pulse: (!) 51  Temp: 98.4 F (36.9 C)  TempSrc: Oral  SpO2: 96%  Weight: 219 lb 9 oz (99.6 kg)   Body mass index is 31.5 kg/m.  Advanced Directives 01/29/2021 10/10/2020 05/30/2020 05/23/2020 05/13/2020 05/23/2018 04/19/2018  Does Patient Have a Medical Advance Directive? Yes No Yes Yes Yes Yes Yes  Type of Paramedic of [place];Living will - Healthcare Power of [place];Living will [place];Living will [place];Living will [place];Living will  Does patient want to make changes to medical advance directive? No - Patient declined - No - Patient declined - No - Patient declined No - Patient declined No - Patient declined  Copy of [place] in Chart? - - Yes - validated most recent copy scanned in chart (See row information) - Yes - validated most recent copy scanned in chart (See row information) No - copy requested -    Current Medications (verified) Outpatient Encounter Medications as of 01/29/2021  Medication Sig  . acetaminophen (TYLENOL) 325 MG tablet Take 650 mg by mouth every 6 (six) hours as needed for moderate pain or headache.   Marland Kitchen atorvastatin (LIPITOR) 80 MG tablet TAKE 1 TABLET DAILY  . buPROPion (WELLBUTRIN XL) 300 MG 24 hr tablet Take 1 tablet (300 mg total) by mouth daily.  . calcipotriene (DOVONOX) 0.005 % cream Apply 1 application topically 2 (two) times daily.  . clobetasol cream (TEMOVATE) 5.27 % Apply 1 application topically daily.  . clopidogrel (PLAVIX) 75 MG tablet Take 1 tablet (75 mg total) by mouth daily.  Marland Kitchen lisinopril (ZESTRIL) 20 MG tablet Take 1 tablet (20  mg total) by mouth daily.  . Melatonin 10 MG TABS Take 20 mg by mouth at bedtime.  . metoprolol tartrate (LOPRESSOR) 25 MG tablet Take 1 tablet (25 mg total) by mouth 2 (two) times daily.  . Multiple Vitamin (MULTIVITAMIN) capsule Take 1 capsule by mouth daily.  . sildenafil (VIAGRA) 100 MG tablet Take 100 mg by mouth daily as needed for erectile dysfunction.  . traZODone (DESYREL) 50 MG tablet TAKE 1 TABLET AT BEDTIME ASNEEDED FOR SLEEP  . nitroGLYCERIN (NITROSTAT) 0.4 MG SL tablet Place 1 tablet (0.4 mg total) under the tongue every 5 (five) minutes as needed. (Patient not taking: Reported on 01/29/2021)   No facility-administered encounter medications on file as of 01/29/2021.    Allergies (verified) Patient has no known allergies.   History: Past Medical History:  Diagnosis Date  . AAA (abdominal aortic aneurysm) ([place])   . AAA (abdominal aortic aneurysm) without rupture ([place]) 01/19/2017  . Adjustment disorder with depressed mood 03/04/2009  . Arthritis   . Colon polyps   . CORONARY ARTERY DISEASE 07/10/2007  . Coronary artery disease involving native coronary artery of native heart without angina pectoris 07/10/2007   Qualifier: Diagnosis of  By: Scherrie Gerlach    . Depression   . DOE (dyspnea on exertion)   . Episodic mood disorder ([place]) 08/01/2014  . ERECTILE DYSFUNCTION 07/10/2007  . Extrinsic asthma, unspecified 09/10/2008  . HYPERLIPIDEMIA 07/10/2007  . HYPERTENSION 07/10/2007  . Myocardial infarction ([place])  2016   when had angioplasty- was told had a silent MI  . OA (osteoarthritis) of hip 04/19/2018  . Onychomycosis 05/14/2014  . Pain in joint of left knee 01/19/2017  . Personal history of colonic polyps 07/08/2014   1997, 2001 8 mm transverse serrated polyp right colon 2010 07/08/2014 no polyps - repeat colon 2022     . S/P CABG x 2 05/27/2020  . TOBACCO USE 12/15/2009   Past Surgical History:  Procedure Laterality Date  . COLONOSCOPY    . CORONARY ANGIOPLASTY  12/2014  .  CORONARY ARTERY BYPASS GRAFT  11/15/89   5 vessels  . CORONARY ARTERY BYPASS GRAFT N/A 05/27/2020   Procedure: REDO CORONARY ARTERY BYPASS GRAFTING (CABG), ON PUMP, TIMES TWO, USING RIGHT INTERNAL MAMMARY ARTERY AND LEFT RADIAL ARTERY (OPEN);  Surgeon: Grace Isaac, MD;  Location: [facility];  Service: Open Heart Surgery;  Laterality: N/A;  Left Radial Artery to PDA FREE RIMA to OM  . HYDROCELE EXCISION / REPAIR  2011  . INGUINAL HERNIA REPAIR Left 1985  . LEFT HEART CATH AND CORS/GRAFTS ANGIOGRAPHY N/A 05/13/2020   Procedure: LEFT HEART CATH AND CORS/GRAFTS ANGIOGRAPHY;  Surgeon: Belva Crome, MD;  Location: [facility];  Service: Cardiovascular;  Laterality: N/A;  . MEDIASTERNOTOMY N/A 05/27/2020   Procedure: MEDIAN STERNOTOMY;  Surgeon: Grace Isaac, MD;  Location: [facility];  Service: Open Heart Surgery;  Laterality: N/A;  Redo Median Sternotomy  . RADIAL ARTERY HARVEST Left 05/27/2020   Procedure: RADIAL ARTERY HARVEST;  Surgeon: Grace Isaac, MD;  Location: [facility];  Service: Open Heart Surgery;  Laterality: Left;  . TEE WITHOUT CARDIOVERSION N/A 05/27/2020   Procedure: TRANSESOPHAGEAL ECHOCARDIOGRAM (TEE);  Surgeon: Grace Isaac, MD;  Location: [facility];  Service: Open Heart Surgery;  Laterality: N/A;  . TOTAL HIP ARTHROPLASTY Right 04/19/2018   Procedure: RIGHT TOTAL HIP ARTHROPLASTY ANTERIOR APPROACH;  Surgeon: Gaynelle Arabian, MD;  Location: [facility];  Service: Orthopedics;  Laterality: Right;   Family History  Problem Relation Age of Onset  . Coronary artery disease Father        cabg  . Cancer Mother        Breast and or lung   . Depression Mother   . Hyperlipidemia Other   . Hypertension Other   . Depression Paternal Grandfather   . Colon cancer Neg Hx    Social History   Socioeconomic History  . Marital status: Married    Spouse name: Not on file  . Number of children: Not on file  . Years of education: Not on file  . Highest education level: Not on file   Occupational History  . Not on file  Tobacco Use  . Smoking status: Former Smoker    Packs/day: 0.50    Quit date: 1991    Years since quitting: 31.2  . Smokeless tobacco: Never Used  Vaping Use  . Vaping Use: Never used  Substance and Sexual Activity  . Alcohol use: Yes    Alcohol/week: 1.0 standard drink    Types: 1 Cans of beer per week    Comment: Gin 2 x week  . Drug use: No  . Sexual activity: Yes    Comment: regular exercise - yes  Other Topics Concern  . Not on file  Social History Narrative   Retired from being an Financial controller of a small business    Married    No kids       He  likes to play golf and rides bike       Social Determinants of Health   Financial Resource Strain: Low Risk   . Difficulty of Paying Living Expenses: Not hard at all  Food Insecurity: No Food Insecurity  . Worried About Charity fundraiser in the Last Year: Never true  . Ran Out of Food in the Last Year: Never true  Transportation Needs: No Transportation Needs  . Lack of Transportation (Medical): No  . Lack of Transportation (Non-Medical): No  Physical Activity: Sufficiently Active  . Days of Exercise per Week: 4 days  . Minutes of Exercise per Session: 60 min  Stress: No Stress Concern Present  . Feeling of Stress : Not at all  Social Connections: Moderately Isolated  . Frequency of Communication with Friends and Family: Three times a week  . Frequency of Social Gatherings with Friends and Family: Three times a week  . Attends Religious Services: Never  . Active Member of Clubs or Organizations: No  . Attends Archivist Meetings: Never  . Marital Status: Married    Tobacco Counseling Counseling given: Not Answered   Clinical Intake:  Pre-visit preparation completed: Yes  Pain : No/denies pain     Nutritional Risks: None Diabetes: No  How often do you need to have someone help you when you read instructions, pamphlets, or other written materials from your doctor  or pharmacy?: 1 - Never  Diabetic?No   Interpreter Needed?: No  Information entered by :: [place] of Daily Living In your present state of health, do you have any difficulty performing the following activities: 01/29/2021 05/30/2020  Hearing? N N  Vision? N N  Difficulty concentrating or making decisions? N N  Walking or climbing stairs? N N  Dressing or bathing? N N  Doing errands, shopping? N N  Preparing Food and eating ? N -  Using the Toilet? N -  In the past six months, have you accidently leaked urine? Y -  Comment occassional drops of urine -  Do you have problems with loss of bowel control? N -  Managing your Medications? N -  Managing your Finances? N -  Housekeeping or managing your Housekeeping? N -  Some recent data might be hidden    Patient Care Team: Dorothyann Peng, NP as PCP - General (Family Medicine)  Indicate any recent Medical Services you may have received from other than [facility] providers in the past year (date may be approximate).     Assessment:   This is a routine wellness examination for [facility].  Hearing/Vision screen  Hearing Screening   125Hz  250Hz  500Hz  1000Hz  2000Hz  3000Hz  4000Hz  6000Hz  8000Hz   Right ear:           Left ear:           Vision Screening Comments: Gets eye exams twice a year. Currently watching for macular degeneration. Wears reading glasses   Dietary issues and exercise activities discussed: Current Exercise Habits: Home exercise routine, Type of exercise: walking;strength training/weights (Bicycle), Time (Minutes): 60, Frequency (Times/Week): 4, Weekly Exercise (Minutes/Week): 240, Intensity: Moderate, Exercise limited by: None identified  Goals    . Weight (lb) < 200 lb (90.7 kg)      Depression Screen PHQ 2/9 Scores 01/29/2021 09/20/2016 05/21/2016 01/27/2015 01/03/2014  PHQ - 2 Score 0 0 6 0 0  PHQ- 9 Score - - 13 - -    Fall Risk Fall Risk  01/29/2021 09/20/2016 01/27/2015 01/03/2014  Falls in the past year?  0 No No No  Number falls in past yr: 0 - - -  Injury with Fall? 0 - - -  Risk for fall due to : No Fall Risks - - -  Follow up Falls evaluation completed;Falls prevention discussed - - -    FALL RISK PREVENTION PERTAINING TO THE HOME:  Any stairs in or around the home? Yes  If so, are there any without handrails? No  Home free of loose throw rugs in walkways, pet beds, electrical cords, etc? Yes  Adequate lighting in your home to reduce risk of falls? Yes   ASSISTIVE DEVICES UTILIZED TO PREVENT FALLS:  Life alert? No  Use of a cane, walker or w/c? No  Grab bars in the bathroom? Yes  Shower chair or bench in shower? Yes  Elevated toilet seat or a handicapped toilet? Yes   TIMED UP AND GO:  Was the test performed? Yes .  Length of time to ambulate 10 feet: 3 sec.   Gait steady and fast without use of assistive device  Cognitive Function:   Normal cognitive status assessed by direct observation by this Nurse Health Advisor. No abnormalities found.        Immunizations Immunization History  Administered Date(s) Administered  . Fluad Quad(high Dose 65+) 07/31/2019  . Influenza Split 08/15/2013  . Influenza Whole 11/15/2005, 08/21/2007, 09/15/2009  . Influenza, High Dose Seasonal PF 08/19/2015, 09/20/2016, 07/13/2017, 09/28/2018  . Influenza,inj,Quad PF,6+ Mos 08/01/2014  . Influenza-Unspecified 08/01/2020  . PFIZER(Purple Top)SARS-COV-2 Vaccination 12/24/2019, 01/11/2020, 08/10/2020  . Pneumococcal Conjugate-13 01/03/2014  . Pneumococcal Polysaccharide-23 11/16/2003, 12/15/2009, 09/20/2016  . Td 11/15/1996, 08/21/2007  . Zoster 05/07/2011  . Zoster Recombinat (Shingrix) 02/01/2018, 05/28/2018    TDAP status: Due, Education has been provided regarding the importance of this vaccine. Advised may receive this vaccine at local pharmacy or Health Dept. Aware to provide a copy of the vaccination record if obtained from local pharmacy or Health Dept. Verbalized acceptance and  understanding.  Flu Vaccine status: Up to date  Pneumococcal vaccine status: Up to date  Covid-19 vaccine status: Completed vaccines  Qualifies for Shingles Vaccine? Yes   Zostavax completed Yes   Shingrix Completed?: Yes  Screening Tests Health Maintenance  Topic Date Due  . Hepatitis C Screening  Never done  . COLONOSCOPY (Pts 45-50yrs Insurance coverage will need to be confirmed)  07/08/2021  . INFLUENZA VACCINE  Completed  . COVID-19 Vaccine  Completed  . PNA vac Low Risk Adult  Completed  . HPV VACCINES  Aged Out    Health Maintenance  Health Maintenance Due  Topic Date Due  . Hepatitis C Screening  Never done    Colorectal cancer screening: Type of screening: Colonoscopy. Completed 07/08/2014. Repeat every 7 years  Lung Cancer Screening: (Low Dose CT Chest recommended if Age 53-80 years, 30 pack-year currently smoking OR have quit w/in 15years.) does not qualify.   Lung Cancer Screening Referral: N/A   Additional Screening:  Hepatitis C Screening: does qualify;   Vision Screening: Recommended annual ophthalmology exams for early detection of glaucoma and other disorders of the eye. Is the patient up to date with their annual eye exam?  Yes  Who is the provider or what is the name of the office in which the patient attends annual eye exams? Dr. Jerline Pain If pt is not established with a provider, would they like to be referred to a provider to establish care? No .   Dental Screening:  Recommended annual dental exams for proper oral hygiene  Community Resource Referral / Chronic Care Management: CRR required this visit?  No   CCM required this visit?  No      Plan:     I have personally reviewed and noted the following in the patient's chart:   . Medical and social history . Use of alcohol, tobacco or illicit drugs  . Current medications and supplements . Functional ability and status . Nutritional status . Physical activity . Advanced  directives . List of other physicians . Hospitalizations, surgeries, and ER visits in previous 12 months . Vitals . Screenings to include cognitive, depression, and falls . Referrals and appointments  In addition, I have reviewed and discussed with patient certain preventive protocols, quality metrics, and best practice recommendations. A written personalized care plan for preventive services as well as general preventive health recommendations were provided to patient.     Ofilia Neas, LPN   [id number]   Nurse Notes: None

## 2021-01-29 ENCOUNTER — Other Ambulatory Visit: Payer: Self-pay

## 2021-01-29 ENCOUNTER — Ambulatory Visit (INDEPENDENT_AMBULATORY_CARE_PROVIDER_SITE_OTHER): Payer: Medicare HMO

## 2021-01-29 VITALS — BP 130/70 | HR 51 | Temp 98.4°F | Wt 219.6 lb

## 2021-01-29 DIAGNOSIS — Z0000 Encounter for general adult medical examination without abnormal findings: Secondary | ICD-10-CM

## 2021-01-29 NOTE — Patient Instructions (Signed)
Dave Robles , Thank you for taking time to come for your Medicare Wellness Visit. I appreciate your ongoing commitment to your health goals. Please review the following plan we discussed and let me know if I can assist you in the future.   Screening recommendations/referrals: Colonoscopy: Up to date, next due 07/08/2022 Recommended yearly ophthalmology/optometry visit for glaucoma screening and checkup Recommended yearly dental visit for hygiene and checkup  Vaccinations: Influenza vaccine: Up to date, next due fall 2022 Pneumococcal vaccine: Completed series  Tdap vaccine: Currently due, you may receive at your physical for your cut on your arm.  Shingles vaccine: Completed series     Advanced directives: Please bring in a copy of your advanced medical directives so that we may scan them into your chart.   Conditions/risks identified: None   Next appointment: 02/04/2021 @ 8:30 am with Dorothyann Peng, NP   Preventive Care 7 Years and Older, Male Preventive care refers to lifestyle choices and visits with your health care provider that can promote health and wellness. What does preventive care include?  A yearly physical exam. This is also called an annual well check.  Dental exams once or twice a year.  Routine eye exams. Ask your health care provider how often you should have your eyes checked.  Personal lifestyle choices, including:  Daily care of your teeth and gums.  Regular physical activity.  Eating a healthy diet.  Avoiding tobacco and drug use.  Limiting alcohol use.  Practicing safe sex.  Taking low doses of aspirin every day.  Taking vitamin and mineral supplements as recommended by your health care provider. What happens during an annual well check? The services and screenings done by your health care provider during your annual well check will depend on your age, overall health, lifestyle risk factors, and family history of disease. Counseling  Your  health care provider may ask you questions about your:  Alcohol use.  Tobacco use.  Drug use.  Emotional well-being.  Home and relationship well-being.  Sexual activity.  Eating habits.  History of falls.  Memory and ability to understand (cognition).  Work and work Statistician. Screening  You may have the following tests or measurements:  Height, weight, and BMI.  Blood pressure.  Lipid and cholesterol levels. These may be checked every 5 years, or more frequently if you are over 15 years old.  Skin check.  Lung cancer screening. You may have this screening every year starting at age 40 if you have a 30-pack-year history of smoking and currently smoke or have quit within the past 15 years.  Fecal occult blood test (FOBT) of the stool. You may have this test every year starting at age 64.  Flexible sigmoidoscopy or colonoscopy. You may have a sigmoidoscopy every 5 years or a colonoscopy every 10 years starting at age 56.  Prostate cancer screening. Recommendations will vary depending on your family history and other risks.  Hepatitis C blood test.  Hepatitis B blood test.  Sexually transmitted disease (STD) testing.  Diabetes screening. This is done by checking your blood sugar (glucose) after you have not eaten for a while (fasting). You may have this done every 1-3 years.  Abdominal aortic aneurysm (AAA) screening. You may need this if you are a current or former smoker.  Osteoporosis. You may be screened starting at age 43 if you are at high risk. Talk with your health care provider about your test results, treatment options, and if necessary, the need for more  tests. Vaccines  Your health care provider may recommend certain vaccines, such as:  Influenza vaccine. This is recommended every year.  Tetanus, diphtheria, and acellular pertussis (Tdap, Td) vaccine. You may need a Td booster every 10 years.  Zoster vaccine. You may need this after age  53.  Pneumococcal 13-valent conjugate (PCV13) vaccine. One dose is recommended after age 67.  Pneumococcal polysaccharide (PPSV23) vaccine. One dose is recommended after age 11. Talk to your health care provider about which screenings and vaccines you need and how often you need them. This information is not intended to replace advice given to you by your health care provider. Make sure you discuss any questions you have with your health care provider. Document Released: 11/28/2015 Document Revised: 07/21/2016 Document Reviewed: 09/02/2015 Elsevier Interactive Patient Education  2017 [place] Prevention in the Home Falls can cause injuries. They can happen to people of all ages. There are many things you can do to make your home safe and to help prevent falls. What can I do on the outside of my home?  Regularly fix the edges of walkways and driveways and fix any cracks.  Remove anything that might make you trip as you walk through a door, such as a raised step or threshold.  Trim any bushes or trees on the path to your home.  Use bright outdoor lighting.  Clear any walking paths of anything that might make someone trip, such as rocks or tools.  Regularly check to see if handrails are loose or broken. Make sure that both sides of any steps have handrails.  Any raised decks and porches should have guardrails on the edges.  Have any leaves, snow, or ice cleared regularly.  Use sand or salt on walking paths during winter.  Clean up any spills in your garage right away. This includes oil or grease spills. What can I do in the bathroom?  Use night lights.  Install grab bars by the toilet and in the tub and shower. Do not use towel bars as grab bars.  Use non-skid mats or decals in the tub or shower.  If you need to sit down in the shower, use a plastic, non-slip stool.  Keep the floor dry. Clean up any water that spills on the floor as soon as it happens.  Remove  soap buildup in the tub or shower regularly.  Attach bath mats securely with double-sided non-slip rug tape.  Do not have throw rugs and other things on the floor that can make you trip. What can I do in the bedroom?  Use night lights.  Make sure that you have a light by your bed that is easy to reach.  Do not use any sheets or blankets that are too big for your bed. They should not hang down onto the floor.  Have a firm chair that has side arms. You can use this for support while you get dressed.  Do not have throw rugs and other things on the floor that can make you trip. What can I do in the kitchen?  Clean up any spills right away.  Avoid walking on wet floors.  Keep items that you use a lot in easy-to-reach places.  If you need to reach something above you, use a strong step stool that has a grab bar.  Keep electrical cords out of the way.  Do not use floor polish or wax that makes floors slippery. If you must use wax, use non-skid floor  wax.  Do not have throw rugs and other things on the floor that can make you trip. What can I do with my stairs?  Do not leave any items on the stairs.  Make sure that there are handrails on both sides of the stairs and use them. Fix handrails that are broken or loose. Make sure that handrails are as long as the stairways.  Check any carpeting to make sure that it is firmly attached to the stairs. Fix any carpet that is loose or worn.  Avoid having throw rugs at the top or bottom of the stairs. If you do have throw rugs, attach them to the floor with carpet tape.  Make sure that you have a light switch at the top of the stairs and the bottom of the stairs. If you do not have them, ask someone to add them for you. What else can I do to help prevent falls?  Wear shoes that:  Do not have high heels.  Have rubber bottoms.  Are comfortable and fit you well.  Are closed at the toe. Do not wear sandals.  If you use a  stepladder:  Make sure that it is fully opened. Do not climb a closed stepladder.  Make sure that both sides of the stepladder are locked into place.  Ask someone to hold it for you, if possible.  Clearly mark and make sure that you can see:  Any grab bars or handrails.  First and last steps.  Where the edge of each step is.  Use tools that help you move around (mobility aids) if they are needed. These include:  Canes.  Walkers.  Scooters.  Crutches.  Turn on the lights when you go into a dark area. Replace any light bulbs as soon as they burn out.  Set up your furniture so you have a clear path. Avoid moving your furniture around.  If any of your floors are uneven, fix them.  If there are any pets around you, be aware of where they are.  Review your medicines with your doctor. Some medicines can make you feel dizzy. This can increase your chance of falling. Ask your doctor what other things that you can do to help prevent falls. This information is not intended to replace advice given to you by your health care provider. Make sure you discuss any questions you have with your health care provider. Document Released: 08/28/2009 Document Revised: 04/08/2016 Document Reviewed: 12/06/2014 Elsevier Interactive Patient Education  2017 [employer].

## 2021-02-01 NOTE — Progress Notes (Deleted)
Cardiology Office Note:    Date:  02/01/2021   ID:  Dave Robles, DOB 1946/06/23, MRN [id number]  PCP:  Dorothyann Peng, NP   [facility]  Cardiologist:  No primary care provider on file.  Advanced Practice Provider:  No care team member to display Electrophysiologist:  None    Referring MD: Dorothyann Peng, NP    History of Present Illness:    Dave Robles is a 75 y.o. male with a hx of CAD with original CABG x 3 with LIMA to LAD, SVG to DX and SVG to distal RCA in 1991 by Dr. Redmond Pulling, HTN, HLD, ED, pasttobacco abuse, asthma, AAA (last measured in 10/2019 - 3.4 cm)and depression who presents to clinic for follow-up. Was previously followed by [name].  Per review of the record, the patient had a non-ST elevationmyocardial infarction in 2016 while vacationing in [place].Cathnoted to have normal left main, 90% prox LAD, 99% mid LAD with patent LIMA to mid LAD and patent SVG to DX. LCX origin stenosis vs flow void? Fistula vs competitive flow in branch?, 100% RCA, SVG to RCA 99% origin treated with Xience DES and PTCA to the distal.  Patient has been closely followed by [name]. Was seen in 04/2020 with worsening dyspnea on exertion which is his anginal equivalent. Was referred for cardiac cath at that time which led to need for repeat CABG on 05/27/20. Specifically, he received a left radial artery to PDA, and a free right internal mammary artery to obtuse marginal.Post-op course complicated by need for inotropic support and blood loss anemia requiring blood transfusion.  Last seen by [name] for 10/20/21 where he was doing well. Right sided pleural effusion improved. On low dose lasix.  Past Medical History:  Diagnosis Date  . AAA (abdominal aortic aneurysm) ([place])   . AAA (abdominal aortic aneurysm) without rupture ([place]) 01/19/2017  . Adjustment disorder with depressed mood 03/04/2009  . Arthritis   . Colon polyps   . CORONARY  ARTERY DISEASE 07/10/2007  . Coronary artery disease involving native coronary artery of native heart without angina pectoris 07/10/2007   Qualifier: Diagnosis of  By: Scherrie Gerlach    . Depression   . DOE (dyspnea on exertion)   . Episodic mood disorder ([place]) 08/01/2014  . ERECTILE DYSFUNCTION 07/10/2007  . Extrinsic asthma, unspecified 09/10/2008  . HYPERLIPIDEMIA 07/10/2007  . HYPERTENSION 07/10/2007  . Myocardial infarction ([place]) 2016   when had angioplasty- was told had a silent MI  . OA (osteoarthritis) of hip 04/19/2018  . Onychomycosis 05/14/2014  . Pain in joint of left knee 01/19/2017  . Personal history of colonic polyps 07/08/2014   1997, 2001 8 mm transverse serrated polyp right colon 2010 07/08/2014 no polyps - repeat colon 2022     . S/P CABG x 2 05/27/2020  . TOBACCO USE 12/15/2009    Past Surgical History:  Procedure Laterality Date  . COLONOSCOPY    . CORONARY ANGIOPLASTY  12/2014  . CORONARY ARTERY BYPASS GRAFT  11/15/89   5 vessels  . CORONARY ARTERY BYPASS GRAFT N/A 05/27/2020   Procedure: REDO CORONARY ARTERY BYPASS GRAFTING (CABG), ON PUMP, TIMES TWO, USING RIGHT INTERNAL MAMMARY ARTERY AND LEFT RADIAL ARTERY (OPEN);  Surgeon: Grace Isaac, MD;  Location: [place] [facility];  Service: Open Heart Surgery;  Laterality: N/A;  Left Radial Artery to PDA FREE RIMA to OM  . HYDROCELE EXCISION / REPAIR  2011  . INGUINAL HERNIA REPAIR Left 1985  .  LEFT HEART CATH AND CORS/GRAFTS ANGIOGRAPHY N/A 05/13/2020   Procedure: LEFT HEART CATH AND CORS/GRAFTS ANGIOGRAPHY;  Surgeon: Belva Crome, MD;  Location: [facility];  Service: Cardiovascular;  Laterality: N/A;  . MEDIASTERNOTOMY N/A 05/27/2020   Procedure: MEDIAN STERNOTOMY;  Surgeon: Grace Isaac, MD;  Location: [place];  Service: Open Heart Surgery;  Laterality: N/A;  Redo Median Sternotomy  . RADIAL ARTERY HARVEST Left 05/27/2020   Procedure: RADIAL ARTERY HARVEST;  Surgeon: Grace Isaac, MD;  Location: [facility];  Service:  Open Heart Surgery;  Laterality: Left;  . TEE WITHOUT CARDIOVERSION N/A 05/27/2020   Procedure: TRANSESOPHAGEAL ECHOCARDIOGRAM (TEE);  Surgeon: Grace Isaac, MD;  Location: [place];  Service: Open Heart Surgery;  Laterality: N/A;  . TOTAL HIP ARTHROPLASTY Right 04/19/2018   Procedure: RIGHT TOTAL HIP ARTHROPLASTY ANTERIOR APPROACH;  Surgeon: Gaynelle Arabian, MD;  Location: [facility];  Service: Orthopedics;  Laterality: Right;    Current Medications: No outpatient medications have been marked as taking for the 02/03/21 encounter (Appointment) with Freada Bergeron, MD.     Allergies:   Patient has no known allergies.   Social History   Socioeconomic History  . Marital status: Married    Spouse name: Not on file  . Number of children: Not on file  . Years of education: Not on file  . Highest education level: Not on file  Occupational History  . Not on file  Tobacco Use  . Smoking status: Former Smoker    Packs/day: 0.50    Quit date: 1991    Years since quitting: 31.2  . Smokeless tobacco: Never Used  Vaping Use  . Vaping Use: Never used  Substance and Sexual Activity  . Alcohol use: Yes    Alcohol/week: 1.0 standard drink    Types: 1 Cans of beer per week    Comment: Gin 2 x week  . Drug use: No  . Sexual activity: Yes    Comment: regular exercise - yes  Other Topics Concern  . Not on file  Social History Narrative   Retired from being an Financial controller of a small business    Married    No kids       He likes to play golf and rides bike       Social Determinants of Radio broadcast assistant Strain: Low Risk   . Difficulty of Paying Living Expenses: Not hard at all  Food Insecurity: No Food Insecurity  . Worried About Charity fundraiser in the Last Year: Never true  . Ran Out of Food in the Last Year: Never true  Transportation Needs: No Transportation Needs  . Lack of Transportation (Medical): No  . Lack of Transportation (Non-Medical): No  Physical Activity:  Sufficiently Active  . Days of Exercise per Week: 4 days  . Minutes of Exercise per Session: 60 min  Stress: No Stress Concern Present  . Feeling of Stress : Not at all  Social Connections: Moderately Isolated  . Frequency of Communication with Friends and Family: Three times a week  . Frequency of Social Gatherings with Friends and Family: Three times a week  . Attends Religious Services: Never  . Active Member of Clubs or Organizations: No  . Attends Archivist Meetings: Never  . Marital Status: Married     Family History: The patient's ***family history includes Cancer in his mother; Coronary artery disease in his father; Depression in his mother and paternal grandfather; Hyperlipidemia  in an other family member; Hypertension in an other family member. There is no history of Colon cancer.  ROS:   Please see the history of present illness.    *** All other systems reviewed and are negative.  EKGs/Labs/Other Studies Reviewed:    The following studies were reviewed today: Cath 05/13/20:  Total occlusion of the mid LAD, total occlusion of the mid RCA, and high-grade calcified obstruction in the ostial to proximal circumflex with diffuse 50% left main stenosis.  Patent saphenous vein graft to the diagonal with 70 to 80% stenosis distal to the graft insertion site.  The diagonal fills retrogradely into the first septal perforator and the proximal LAD segment.  Diffuse 95% in-stent restenosis in the ostial to proximal saphenous vein graft to the distal RCA.  Normal LV function.  LVEDP is normal.  EF is estimated to be 60%.  RECOMMENDATIONS:   Critical decision point for this patient who had coronary bypass surgery in 1991, and now has diffuse in-stent restenosis in the ostium and proximal segment of the saphenous vein graft to the right coronary.  The native circumflex is heavily calcified and has critical stenosis.  The diagonal beyond the saphenous vein graft insertion  site has significant disease.  Long-term patency of the right coronary graft will be markedly reduced PCI and restenting.  The technical standpoint it appears that the stent extends outside the ostium of the bypass graft.  The right coronary territory is large.  The patient is relatively healthy.  His best long-term approach may be repeat coronary bypass surgery.  Before starting down the path of stent implantation, will  refer for heart team evaluation to consider surgical revascularization.  If deemed not a surgical candidate or if the patient prefers PCI, this could be attempted but will carry higher technical risk and likelihood of repeat revascularization.   CTA CHEST IMPRESSION 09/2020: 1. Mild likely consistent with very mild colitis and/or diverticulitis involving the mid to distal descending colon. 2. 3.5 cm x 3.2 cm focal aneurysmal dilatation of the infrarenal abdominal aorta. 3. Large right pleural effusion. 4. Mild-to-moderate severity right basilar atelectasis and/or infiltrate. 5. Total right hip replacement. 6. Aortic atherosclerosis.  Aortic Atherosclerosis (ICD10-I70.0).  Electronically Signed By: Virgina Norfolk M.D. On: 10/10/2020 20:33   OPERATIVE REPORT DATE OF PROCEDURE: 05/27/2020  NAME OF PROCEDURE: Redo coronary artery bypass grafting with free right internal mammary artery to the obtuse marginal and left radial graft to the posterior descending coronary artery with open left radial harvesting  SURGEON: Lanelle Bal, MD  FIRST ASSISTANT: Jadene Pierini, PA    ECHOIMPRESSIONS7/2021 1. Left ventricular ejection fraction, by estimation, is 60 to 65%. The  left ventricle has normal function. The left ventricle has no regional  wall motion abnormalities. There is moderate left ventricular hypertrophy.  Left ventricular diastolic  parameters were normal.  2. Right ventricular systolic function is mildly reduced. The right  ventricular  size is normal. Tricuspid regurgitation signal is inadequate  for assessing PA pressure.  3. Left atrial size was moderately dilated.  4. The mitral valve is normal in structure. Trivial mitral valve  regurgitation.  5. The aortic valve is tricuspid. Aortic valve regurgitation is not  visualized. Mild to moderate aortic valve sclerosis/calcification is  present, without any evidence of aortic stenosis.  6. The inferior vena cava is normal in size with greater than 50%  respiratory variability, suggesting right atrial pressure of 3 mmHg.    EKG:  EKG is *** ordered today.  The ekg ordered today demonstrates ***  Recent Labs: 05/28/2020: Magnesium 2.1 10/10/2020: ALT 23; Hemoglobin 14.8; Platelets 182 10/27/2020: BUN 12; Creatinine, Ser 1.23; Potassium 4.3; Sodium 141  Recent Lipid Panel    Component Value Date/Time   CHOL 92 (L) 07/09/2020 1527   TRIG 89 07/09/2020 1527   HDL 30 (L) 07/09/2020 1527   CHOLHDL 3.1 07/09/2020 1527   CHOLHDL 4 01/10/2020 0851   VLDL 18.0 01/10/2020 0851   LDLCALC 44 07/09/2020 1527   LDLDIRECT 66.7 11/30/2011 1326     Risk Assessment/Calculations:   {Does this patient have ATRIAL FIBRILLATION?:[phone]}   Physical Exam:    VS:  There were no vitals taken for this visit.    Wt Readings from Last 3 Encounters:  01/29/21 219 lb 9 oz (99.6 kg)  10/20/20 212 lb (96.2 kg)  10/10/20 211 lb (95.7 kg)     GEN: *** Well nourished, well developed in no acute distress HEENT: Normal NECK: No JVD; No carotid bruits LYMPHATICS: No lymphadenopathy CARDIAC: ***RRR, no murmurs, rubs, gallops RESPIRATORY:  Clear to auscultation without rales, wheezing or rhonchi  ABDOMEN: Soft, non-tender, non-distended MUSCULOSKELETAL:  No edema; No deformity  SKIN: Warm and dry NEUROLOGIC:  Alert and oriented x 3 PSYCHIATRIC:  Normal affect   ASSESSMENT:    No diagnosis found. PLAN:    In order of problems listed above:  #CAD s/p CABG with re-do CABG  in 05/2020: Initial CABG in 1991 with LIMA to LAD, SVG to DX and SVG to distal RCA and re-do CABG in 05/2020 with radial artery to PDA and RIMA to OM.  -Continue ASA and plavix  #HTN: -Continue lisinopril 20mg  daily  #HLD:  #AAA:   {Are you ordering a CV Procedure (e.g. stress test, cath, DCCV, TEE, etc)?   Press F2        :[id number]}    Medication Adjustments/Labs and Tests Ordered: Current medicines are reviewed at length with the patient today.  Concerns regarding medicines are outlined above.  No orders of the defined types were placed in this encounter.  No orders of the defined types were placed in this encounter.   There are no Patient Instructions on file for this visit.   Signed, Freada Bergeron, MD  02/01/2021 12:55 PM    [place]

## 2021-02-03 ENCOUNTER — Ambulatory Visit: Payer: Medicare HMO | Admitting: Cardiology

## 2021-02-03 NOTE — Progress Notes (Unsigned)
Cardiology Office Note:    Date:  02/05/2021   ID:  Dave Robles, DOB Jan 02, 1946, MRN [id number]  PCP:  Dorothyann Peng, NP   [place]  Cardiologist:  No primary care provider on file.  Advanced Practice Provider:  No care team member to display Electrophysiologist:  None    Referring MD: Dorothyann Peng, NP    History of Present Illness:    Dave Robles is a 75 y.o. male with a hx of CAD with original CABG x 3 with LIMA to LAD, SVG to DX and SVG to distal RCA in 1991 by Dr. Redmond Pulling, HTN, HLD, ED, pasttobacco abuse, asthma, AAA (last measured in 10/2019 - 3.4 cm)and depression who presents to clinic for follow-up. Was previously followed by [name].  Per review of the record, the patient had a non-ST elevationmyocardial infarction in 2016 while vacationing in [place].Cathnoted to have normal left main, 90% prox LAD, 99% mid LAD with patent LIMA to mid LAD and patent SVG to DX. LCX origin stenosis vs flow void? Fistula vs competitive flow in branch?, 100% RCA, SVG to RCA 99% origin treated with Xience DES and PTCA to the distal.  Patient has been closely followed by [name]. Was seen in 04/2020 with worsening dyspnea on exertion which is his anginal equivalent. Was referred for cardiac cath at that time which led to need for repeat CABG on 05/27/20. Specifically, he received a left radial artery to PDA, and a free right internal mammary artery to obtuse marginal.Post-op course complicated by need for inotropic support and blood loss anemia requiring blood transfusion.  Last seen by [name] for 10/20/21 where he was doing well. Right sided pleural effusion improved. On low dose lasix.  Today, the patient states that he is doing well. Completed cardiac rehab in [place] 2-3 weeks ago. Did well with exercise with no chest pain or SOB. Remains active and goes to the gym, rides a bike, and works in the yard. No orthopnea, PND,  lightheadedness and dizziness. Has been tolerating all medications but has some bruising with plavix.   Past Medical History:  Diagnosis Date  . AAA (abdominal aortic aneurysm) ([place])   . AAA (abdominal aortic aneurysm) without rupture ([place]) 01/19/2017  . Adjustment disorder with depressed mood 03/04/2009  . Arthritis   . Colon polyps   . CORONARY ARTERY DISEASE 07/10/2007  . Coronary artery disease involving native coronary artery of native heart without angina pectoris 07/10/2007   Qualifier: Diagnosis of  By: Scherrie Gerlach    . Depression   . DOE (dyspnea on exertion)   . Episodic mood disorder ([place]) 08/01/2014  . ERECTILE DYSFUNCTION 07/10/2007  . Extrinsic asthma, unspecified 09/10/2008  . HYPERLIPIDEMIA 07/10/2007  . HYPERTENSION 07/10/2007  . Myocardial infarction ([place]) 2016   when had angioplasty- was told had a silent MI  . OA (osteoarthritis) of hip 04/19/2018  . Onychomycosis 05/14/2014  . Pain in joint of left knee 01/19/2017  . Personal history of colonic polyps 07/08/2014   1997, 2001 8 mm transverse serrated polyp right colon 2010 07/08/2014 no polyps - repeat colon 2022     . S/P CABG x 2 05/27/2020  . TOBACCO USE 12/15/2009    Past Surgical History:  Procedure Laterality Date  . COLONOSCOPY    . CORONARY ANGIOPLASTY  12/2014  . CORONARY ARTERY BYPASS GRAFT  11/15/89   5 vessels  . CORONARY ARTERY BYPASS GRAFT N/A 05/27/2020   Procedure: REDO CORONARY  ARTERY BYPASS GRAFTING (CABG), ON PUMP, TIMES TWO, USING RIGHT INTERNAL MAMMARY ARTERY AND LEFT RADIAL ARTERY (OPEN);  Surgeon: Grace Isaac, MD;  Location: [facility];  Service: Open Heart Surgery;  Laterality: N/A;  Left Radial Artery to PDA FREE RIMA to OM  . HYDROCELE EXCISION / REPAIR  2011  . INGUINAL HERNIA REPAIR Left 1985  . LEFT HEART CATH AND CORS/GRAFTS ANGIOGRAPHY N/A 05/13/2020   Procedure: LEFT HEART CATH AND CORS/GRAFTS ANGIOGRAPHY;  Surgeon: Belva Crome, MD;  Location: [facility];  Service:  Cardiovascular;  Laterality: N/A;  . MEDIASTERNOTOMY N/A 05/27/2020   Procedure: MEDIAN STERNOTOMY;  Surgeon: Grace Isaac, MD;  Location: [facility];  Service: Open Heart Surgery;  Laterality: N/A;  Redo Median Sternotomy  . RADIAL ARTERY HARVEST Left 05/27/2020   Procedure: RADIAL ARTERY HARVEST;  Surgeon: Grace Isaac, MD;  Location: [facility];  Service: Open Heart Surgery;  Laterality: Left;  . TEE WITHOUT CARDIOVERSION N/A 05/27/2020   Procedure: TRANSESOPHAGEAL ECHOCARDIOGRAM (TEE);  Surgeon: Grace Isaac, MD;  Location: [facility];  Service: Open Heart Surgery;  Laterality: N/A;  . TOTAL HIP ARTHROPLASTY Right 04/19/2018   Procedure: RIGHT TOTAL HIP ARTHROPLASTY ANTERIOR APPROACH;  Surgeon: Gaynelle Arabian, MD;  Location: [facility];  Service: Orthopedics;  Laterality: Right;    Current Medications: Current Meds  Medication Sig  . acetaminophen (TYLENOL) 325 MG tablet Take 650 mg by mouth every 6 (six) hours as needed for moderate pain or headache.   Marland Kitchen atorvastatin (LIPITOR) 80 MG tablet TAKE 1 TABLET DAILY  . buPROPion (WELLBUTRIN XL) 300 MG 24 hr tablet Take 1 tablet (300 mg total) by mouth daily.  . calcipotriene (DOVONOX) 0.005 % cream Apply 1 application topically 2 (two) times daily.  . clobetasol cream (TEMOVATE) 8.25 % Apply 1 application topically daily.  . clopidogrel (PLAVIX) 75 MG tablet Take 1 tablet (75 mg total) by mouth daily.  Marland Kitchen lisinopril (ZESTRIL) 20 MG tablet Take 1 tablet (20 mg total) by mouth daily.  . Melatonin 10 MG TABS Take 20 mg by mouth at bedtime.  . metoprolol succinate (TOPROL XL) 25 MG 24 hr tablet Take 1 tablet (25 mg total) by mouth daily.  . Multiple Vitamin (MULTIVITAMIN) capsule Take 1 capsule by mouth daily.  . nitroGLYCERIN (NITROSTAT) 0.4 MG SL tablet Place 1 tablet (0.4 mg total) under the tongue every 5 (five) minutes as needed.  . sildenafil (VIAGRA) 100 MG tablet Take 100 mg by mouth daily as needed for erectile dysfunction.  . traZODone  (DESYREL) 100 MG tablet Take 1 tablet (100 mg total) by mouth at bedtime as needed for sleep.  . [DISCONTINUED] furosemide (LASIX) 20 MG tablet Take 1 tablet (20 mg total) by mouth as needed.  . [DISCONTINUED] metoprolol tartrate (LOPRESSOR) 25 MG tablet Take 1 tablet (25 mg total) by mouth 2 (two) times daily.     Allergies:   Patient has no known allergies.   Social History   Socioeconomic History  . Marital status: Married    Spouse name: Not on file  . Number of children: Not on file  . Years of education: Not on file  . Highest education level: Not on file  Occupational History  . Not on file  Tobacco Use  . Smoking status: Former Smoker    Packs/day: 0.50    Quit date: 1991    Years since quitting: 31.2  . Smokeless tobacco: Never Used  Vaping Use  . Vaping Use: Never  used  Substance and Sexual Activity  . Alcohol use: Yes    Alcohol/week: 1.0 standard drink    Types: 1 Cans of beer per week    Comment: Gin 2 x week  . Drug use: No  . Sexual activity: Yes    Comment: regular exercise - yes  Other Topics Concern  . Not on file  Social History Narrative   Retired from being an Financial controller of a small business    Married    No kids       He likes to play golf and rides bike       Social Determinants of Radio broadcast assistant Strain: Low Risk   . Difficulty of Paying Living Expenses: Not hard at all  Food Insecurity: No Food Insecurity  . Worried About Charity fundraiser in the Last Year: Never true  . Ran Out of Food in the Last Year: Never true  Transportation Needs: No Transportation Needs  . Lack of Transportation (Medical): No  . Lack of Transportation (Non-Medical): No  Physical Activity: Sufficiently Active  . Days of Exercise per Week: 4 days  . Minutes of Exercise per Session: 60 min  Stress: No Stress Concern Present  . Feeling of Stress : Not at all  Social Connections: Moderately Isolated  . Frequency of Communication with Friends and Family:  Three times a week  . Frequency of Social Gatherings with Friends and Family: Three times a week  . Attends Religious Services: Never  . Active Member of Clubs or Organizations: No  . Attends Archivist Meetings: Never  . Marital Status: Married     Family History: The patient's family history includes Cancer in his mother; Coronary artery disease in his father; Depression in his mother and paternal grandfather; Hyperlipidemia in an other family member; Hypertension in an other family member. There is no history of Colon cancer.  ROS:   Please see the history of present illness.    Review of Systems  Constitutional: Negative for chills and fever.  HENT: Negative for hearing loss and sore throat.   Eyes: Negative for blurred vision and redness.  Respiratory: Negative for shortness of breath.   Cardiovascular: Negative for chest pain, palpitations, orthopnea, leg swelling and PND.  Gastrointestinal: Negative for melena, nausea and vomiting.  Genitourinary: Negative for dysuria and flank pain.  Musculoskeletal: Negative for falls and myalgias.  Neurological: Negative for dizziness and loss of consciousness.  Endo/Heme/Allergies: Bruises/bleeds easily.  Psychiatric/Behavioral: Negative for substance abuse.    EKGs/Labs/Other Studies Reviewed:    The following studies were reviewed today: Cath 05/13/20:  Total occlusion of the mid LAD, total occlusion of the mid RCA, and high-grade calcified obstruction in the ostial to proximal circumflex with diffuse 50% left main stenosis.  Patent saphenous vein graft to the diagonal with 70 to 80% stenosis distal to the graft insertion site. The diagonal fills retrogradely into the first septal perforator and the proximal LAD segment.  Diffuse 95% in-stent restenosis in the ostial to proximal saphenous vein graft to the distal RCA.  Normal LV function. LVEDP is normal. EF is estimated to be 60%.  RECOMMENDATIONS:   Critical  decision point for this patient who had coronary bypass surgery in 1991, and now has diffuse in-stent restenosis in the ostium and proximal segment of the saphenous vein graft to the right coronary. The native circumflex is heavily calcified and has critical stenosis. The diagonal beyond the saphenous vein graft insertion site has  significant disease. Long-term patency of the right coronary graft will be markedly reduced PCI and restenting. The technical standpoint it appears that the stent extends outside the ostium of the bypass graft. The right coronary territory is large. The patient is relatively healthy. His best long-term approach may be repeat coronary bypass surgery. Before starting down the path of stent implantation, will refer for heart team evaluation to consider surgical revascularization. If deemed not a surgical candidate or if the patient prefers PCI, this could be attempted but will carry higher technical risk and likelihood of repeat revascularization.   CTA CHESTIMPRESSION11/2021: 1. Mild likely consistent with very mild colitis and/or diverticulitis involving the mid to distal descending colon. 2. 3.5 cm x 3.2 cm focal aneurysmal dilatation of the infrarenal abdominal aorta. 3. Large right pleural effusion. 4. Mild-to-moderate severity right basilar atelectasis and/or infiltrate. 5. Total right hip replacement. 6. Aortic atherosclerosis.  Aortic Atherosclerosis (ICD10-I70.0).  Electronically Signed By: Virgina Norfolk M.D. On: 10/10/2020 20:33   OPERATIVE REPORT DATE OF PROCEDURE: 05/27/2020  NAME OF PROCEDURE: Redo coronary artery bypass grafting with free right internal mammary artery to the obtuse marginal and left radial graft to the posterior descending coronary artery with open left radial harvesting  SURGEON: Lanelle Bal, MD  FIRST ASSISTANT: Jadene Pierini, PA    ECHOIMPRESSIONS7/2021 1. Left ventricular ejection  fraction, by estimation, is 60 to 65%. The  left ventricle has normal function. The left ventricle has no regional  wall motion abnormalities. There is moderate left ventricular hypertrophy.  Left ventricular diastolic  parameters were normal.  2. Right ventricular systolic function is mildly reduced. The right  ventricular size is normal. Tricuspid regurgitation signal is inadequate  for assessing PA pressure.  3. Left atrial size was moderately dilated.  4. The mitral valve is normal in structure. Trivial mitral valve  regurgitation.  5. The aortic valve is tricuspid. Aortic valve regurgitation is not  visualized. Mild to moderate aortic valve sclerosis/calcification is  present, without any evidence of aortic stenosis.  6. The inferior vena cava is normal in size with greater than 50%  respiratory variability, suggesting right atrial pressure of 3 mmHg.    EKG:  EKG is  ordered today.  The ekg ordered today demonstrates sinus bradycardia, non-specific ST-T wave changes  Recent Labs: 05/28/2020: Magnesium 2.1 02/04/2021: ALT 30; BUN 18; Creatinine, Ser 1.22; Hemoglobin 15.9; Platelets 159.0; Potassium 3.8; Sodium 143; TSH 3.93  Recent Lipid Panel    Component Value Date/Time   CHOL 101 02/04/2021 0848   CHOL 92 (L) 07/09/2020 1527   TRIG 100.0 02/04/2021 0848   HDL 33.10 (L) 02/04/2021 0848   HDL 30 (L) 07/09/2020 1527   CHOLHDL 3 02/04/2021 0848   VLDL 20.0 02/04/2021 0848   LDLCALC 48 02/04/2021 0848   LDLCALC 44 07/09/2020 1527   LDLDIRECT 66.7 11/30/2011 1326      Physical Exam:    VS:  BP 120/80 (BP Location: Left Arm, Patient Position: Sitting, Cuff Size: Normal)   Pulse (!) 55   Ht 5\' 10"  (1.778 m)   Wt 216 lb (98 kg)   SpO2 95%   BMI 30.99 kg/m     Wt Readings from Last 3 Encounters:  02/05/21 216 lb (98 kg)  02/04/21 214 lb (97.1 kg)  01/29/21 219 lb 9 oz (99.6 kg)     GEN:  Well nourished, well developed in no acute distress HEENT:  Normal NECK: No JVD; No carotid bruits CARDIAC: RRR, no murmurs, rubs, gallops RESPIRATORY:  Clear to auscultation without rales, wheezing or rhonchi  ABDOMEN: Soft, non-tender, non-distended MUSCULOSKELETAL:  No edema; No deformity  SKIN: Warm and dry NEUROLOGIC:  Alert and oriented x 3 PSYCHIATRIC:  Normal affect   ASSESSMENT:    1. Hx of CABG   2. Coronary artery disease involving native heart without angina pectoris, unspecified vessel or lesion type   3. AAA (abdominal aortic aneurysm) without rupture ([place])   4. Pure hypercholesterolemia   5. Essential hypertension    PLAN:    In order of problems listed above:  #CAD s/p CABG with re-do CABG in 05/2020: Initial CABG in 1991 with LIMA to LAD, SVG to DX and SVG to distal RCA and re-do CABG in 05/2020 with radial artery to PDA and RIMA to OM. Doing well with no anginal symptoms. Remains active. TTE pre-bypass with LVEF 60-65%, no WMA -Continue plavix 75mg  daily -Change metop tartrate with succinate 25mg  XL -Continue lisinopril 20mg  daily -Continue lipitor 80mg  daily -Check TTE post-bypass  #HTN: Well controlled. -Continue lisinopril 20mg  daily -Change metop tartrate to succinate 25mg  XL daily  #HLD: LDL 48. -Continue lipitor 80mg  daily  #AAA: Stable at 3.4 cm in 2020. -Repeat ultrasound at next visit in 46months     Medication Adjustments/Labs and Tests Ordered: Current medicines are reviewed at length with the patient today.  Concerns regarding medicines are outlined above.  Orders Placed This Encounter  Procedures  . EKG 12-Lead  . ECHOCARDIOGRAM COMPLETE   Meds ordered this encounter  Medications  . metoprolol succinate (TOPROL XL) 25 MG 24 hr tablet    Sig: Take 1 tablet (25 mg total) by mouth daily.    Dispense:  90 tablet    Refill:  3  . DISCONTD: furosemide (LASIX) 20 MG tablet    Sig: Take 1 tablet (20 mg total) by mouth as needed.    Dispense:  90 tablet    Refill:  1  . furosemide (LASIX)  20 MG tablet    Sig: Take 1 tablet (20 mg total) by mouth daily as needed (leg swelling).    Dispense:  30 tablet    Refill:  2    Patient Instructions  Medication Instructions:  Stop Metoprolol Tartrate Start Metoprolol Succinate 25 mg by mouth daily Start Lasix 20mg  daily as needed for swelling *If you need a refill on your cardiac medications before your next appointment, please call your pharmacy*   Lab Work: None  If you have labs (blood work) drawn today and your tests are completely normal, you will receive your results only by: Marland Kitchen MyChart Message (if you have MyChart) OR . A paper copy in the mail If you have any lab test that is abnormal or we need to change your treatment, we will call you to review the results.   Testing/Procedures: Your physician has requested that you have an echocardiogram. Echocardiography is a painless test that uses sound waves to create images of your heart. It provides your doctor with information about the size and shape of your heart and how well your heart's chambers and valves are working. This procedure takes approximately one hour. There are no restrictions for this procedure.    Follow-Up: At [facility], you and your health needs are our priority.  As part of our continuing mission to provide you with exceptional heart care, we have created designated Provider Care Teams.  These Care Teams include your primary Cardiologist (physician) and Advanced Practice Providers (APPs -  Physician Assistants and Nurse  Practitioners) who all work together to provide you with the care you need, when you need it.  We recommend signing up for the patient portal called "MyChart".  Sign up information is provided on this After Visit Summary.  MyChart is used to connect with patients for Virtual Visits (Telemedicine).  Patients are able to view lab/test results, encounter notes, upcoming appointments, etc.  Non-urgent messages can be sent to your provider as  well.   To learn more about what you can do with MyChart, go to [email or web address].    Your next appointment:   6 month(s)  The format for your next appointment:   In Person  Provider:   You will see one of the following Advanced Practice Providers on your designated Care Team:    Richardson Dopp, PA-C  Robbie Lis, [place]          Signed, Freada Bergeron, MD  02/05/2021 10:44 AM    [place]

## 2021-02-04 ENCOUNTER — Encounter: Payer: Self-pay | Admitting: Adult Health

## 2021-02-04 ENCOUNTER — Other Ambulatory Visit: Payer: Self-pay

## 2021-02-04 ENCOUNTER — Ambulatory Visit (INDEPENDENT_AMBULATORY_CARE_PROVIDER_SITE_OTHER): Payer: Medicare HMO | Admitting: Adult Health

## 2021-02-04 VITALS — BP 136/84 | HR 57 | Temp 98.5°F | Ht 70.0 in | Wt 214.0 lb

## 2021-02-04 DIAGNOSIS — Z0000 Encounter for general adult medical examination without abnormal findings: Secondary | ICD-10-CM | POA: Diagnosis not present

## 2021-02-04 DIAGNOSIS — Z808 Family history of malignant neoplasm of other organs or systems: Secondary | ICD-10-CM | POA: Diagnosis not present

## 2021-02-04 DIAGNOSIS — Z1159 Encounter for screening for other viral diseases: Secondary | ICD-10-CM | POA: Diagnosis not present

## 2021-02-04 DIAGNOSIS — F5101 Primary insomnia: Secondary | ICD-10-CM | POA: Diagnosis not present

## 2021-02-04 DIAGNOSIS — E7800 Pure hypercholesterolemia, unspecified: Secondary | ICD-10-CM

## 2021-02-04 DIAGNOSIS — I10 Essential (primary) hypertension: Secondary | ICD-10-CM | POA: Diagnosis not present

## 2021-02-04 DIAGNOSIS — R69 Illness, unspecified: Secondary | ICD-10-CM | POA: Diagnosis not present

## 2021-02-04 DIAGNOSIS — Z125 Encounter for screening for malignant neoplasm of prostate: Secondary | ICD-10-CM | POA: Diagnosis not present

## 2021-02-04 DIAGNOSIS — Z86018 Personal history of other benign neoplasm: Secondary | ICD-10-CM | POA: Diagnosis not present

## 2021-02-04 DIAGNOSIS — I714 Abdominal aortic aneurysm, without rupture, unspecified: Secondary | ICD-10-CM

## 2021-02-04 DIAGNOSIS — F339 Major depressive disorder, recurrent, unspecified: Secondary | ICD-10-CM

## 2021-02-04 DIAGNOSIS — L738 Other specified follicular disorders: Secondary | ICD-10-CM | POA: Diagnosis not present

## 2021-02-04 DIAGNOSIS — L570 Actinic keratosis: Secondary | ICD-10-CM | POA: Diagnosis not present

## 2021-02-04 DIAGNOSIS — D225 Melanocytic nevi of trunk: Secondary | ICD-10-CM | POA: Diagnosis not present

## 2021-02-04 DIAGNOSIS — L821 Other seborrheic keratosis: Secondary | ICD-10-CM | POA: Diagnosis not present

## 2021-02-04 DIAGNOSIS — D1722 Benign lipomatous neoplasm of skin and subcutaneous tissue of left arm: Secondary | ICD-10-CM | POA: Diagnosis not present

## 2021-02-04 DIAGNOSIS — I2510 Atherosclerotic heart disease of native coronary artery without angina pectoris: Secondary | ICD-10-CM | POA: Diagnosis not present

## 2021-02-04 DIAGNOSIS — C44319 Basal cell carcinoma of skin of other parts of face: Secondary | ICD-10-CM | POA: Diagnosis not present

## 2021-02-04 DIAGNOSIS — D1721 Benign lipomatous neoplasm of skin and subcutaneous tissue of right arm: Secondary | ICD-10-CM | POA: Diagnosis not present

## 2021-02-04 DIAGNOSIS — D485 Neoplasm of uncertain behavior of skin: Secondary | ICD-10-CM | POA: Diagnosis not present

## 2021-02-04 DIAGNOSIS — C44519 Basal cell carcinoma of skin of other part of trunk: Secondary | ICD-10-CM | POA: Diagnosis not present

## 2021-02-04 DIAGNOSIS — L578 Other skin changes due to chronic exposure to nonionizing radiation: Secondary | ICD-10-CM | POA: Diagnosis not present

## 2021-02-04 LAB — CBC WITH DIFFERENTIAL/PLATELET
Basophils Absolute: 0.1 10*3/uL (ref 0.0–0.1)
Basophils Relative: 1.6 % (ref 0.0–3.0)
Eosinophils Absolute: 0.3 10*3/uL (ref 0.0–0.7)
Eosinophils Relative: 3.6 % (ref 0.0–5.0)
HCT: 47.6 % (ref 39.0–52.0)
Hemoglobin: 15.9 g/dL (ref 13.0–17.0)
Lymphocytes Relative: 22 % (ref 12.0–46.0)
Lymphs Abs: 1.8 10*3/uL (ref 0.7–4.0)
MCHC: 33.5 g/dL (ref 30.0–36.0)
MCV: 90.6 fl (ref 78.0–100.0)
Monocytes Absolute: 0.9 10*3/uL (ref 0.1–1.0)
Monocytes Relative: 11.4 % (ref 3.0–12.0)
Neutro Abs: 5.1 10*3/uL (ref 1.4–7.7)
Neutrophils Relative %: 61.4 % (ref 43.0–77.0)
Platelets: 159 10*3/uL (ref 150.0–400.0)
RBC: 5.26 Mil/uL (ref 4.22–5.81)
RDW: 14.9 % (ref 11.5–15.5)
WBC: 8.3 10*3/uL (ref 4.0–10.5)

## 2021-02-04 LAB — LIPID PANEL
Cholesterol: 101 mg/dL (ref 0–200)
HDL: 33.1 mg/dL — ABNORMAL LOW (ref >39.00)
LDL Cholesterol: 48 mg/dL (ref 0–99)
NonHDL: 67.93
Total CHOL/HDL Ratio: 3
Triglycerides: 100 mg/dL (ref 0.0–149.0)
VLDL: 20 mg/dL (ref 0.0–40.0)

## 2021-02-04 LAB — COMPREHENSIVE METABOLIC PANEL
ALT: 30 U/L (ref 0–53)
AST: 28 U/L (ref 0–37)
Albumin: 4 g/dL (ref 3.5–5.2)
Alkaline Phosphatase: 107 U/L (ref 39–117)
BUN: 18 mg/dL (ref 6–23)
CO2: 28 mEq/L (ref 19–32)
Calcium: 9.4 mg/dL (ref 8.4–10.5)
Chloride: 104 mEq/L (ref 96–112)
Creatinine, Ser: 1.22 mg/dL (ref 0.40–1.50)
GFR: 58.75 mL/min — ABNORMAL LOW (ref >60.00)
Glucose, Bld: 88 mg/dL (ref 70–99)
Potassium: 3.8 mEq/L (ref 3.5–5.1)
Sodium: 143 mEq/L (ref 135–145)
Total Bilirubin: 1 mg/dL (ref 0.2–1.2)
Total Protein: 6.4 g/dL (ref 6.0–8.3)

## 2021-02-04 LAB — TSH: TSH: 3.93 u[IU]/mL (ref 0.35–4.50)

## 2021-02-04 LAB — PSA: PSA: 2.29 ng/mL (ref 0.10–4.00)

## 2021-02-04 MED ORDER — METOPROLOL TARTRATE 25 MG PO TABS: 25.0000 mg | ORAL_TABLET | Freq: Two times a day (BID) | ORAL | 3 refills | Status: DC

## 2021-02-04 MED ORDER — TRAZODONE HCL 100 MG PO TABS: 100.0000 mg | ORAL_TABLET | Freq: Every evening | ORAL | 1 refills | Status: DC | PRN

## 2021-02-04 MED ORDER — ATORVASTATIN CALCIUM 80 MG PO TABS: ORAL_TABLET | ORAL | 3 refills | Status: DC

## 2021-02-04 MED ORDER — TRAZODONE HCL 100 MG PO TABS: 100.0000 mg | ORAL_TABLET | Freq: Every evening | ORAL | 0 refills | Status: DC | PRN

## 2021-02-04 MED ORDER — LISINOPRIL 20 MG PO TABS: 20.0000 mg | ORAL_TABLET | Freq: Every day | ORAL | 3 refills | Status: DC

## 2021-02-04 MED ORDER — CLOPIDOGREL BISULFATE 75 MG PO TABS: 75.0000 mg | ORAL_TABLET | Freq: Every day | ORAL | 3 refills | Status: DC

## 2021-02-04 MED ORDER — BUPROPION HCL ER (XL) 300 MG PO TB24: 300.0000 mg | ORAL_TABLET | Freq: Every day | ORAL | 1 refills | Status: DC

## 2021-02-04 NOTE — Patient Instructions (Signed)
It was great seeing you and welcome back!   We will follow up with you regarding your blood work   Please let us know if you need anything

## 2021-02-04 NOTE — Progress Notes (Signed)
Subjective:    Patient ID: Dave Robles, male    DOB: 1946-03-06, 75 y.o.   MRN: [id number]  HPI  Patient presents for yearly preventative medicine examination. He is a pleasant 75 year old male who  has a past medical history of AAA (abdominal aortic aneurysm) ([place]), AAA (abdominal aortic aneurysm) without rupture ([place]) (01/19/2017), Adjustment disorder with depressed mood (03/04/2009), Arthritis, Colon polyps, CORONARY ARTERY DISEASE (07/10/2007), Coronary artery disease involving native coronary artery of native heart without angina pectoris (07/10/2007), Depression, DOE (dyspnea on exertion), Episodic mood disorder ([place]) (08/01/2014), ERECTILE DYSFUNCTION (07/10/2007), Extrinsic asthma, unspecified (09/10/2008), HYPERLIPIDEMIA (07/10/2007), HYPERTENSION (07/10/2007), Myocardial infarction ([place]) (2016), OA (osteoarthritis) of hip (04/19/2018), Onychomycosis (05/14/2014), Pain in joint of left knee (01/19/2017), Personal history of colonic polyps (07/08/2014), S/P CABG x 2 (05/27/2020), and TOBACCO USE (12/15/2009).  CAD/Hyperlipidemia -original CABG x3 with LIMA to LAD, SVG to DX and SVG to distal RCA in 1991.  He had non-ST elevation myocardial infarction vacationing in [place] in 2016 at this time his cath noted to have normal left main, 90% proximal LAD, 99% mid LAD with patent lima to the mid LAD and patent SVG to DX. in June 2021 he was seen by cardiology for 1 more DOE with his anginal equivalent.  He was referred for cardiac cath that led to consultation for repeat CABG.  On 05/27/2020 he underwent a redo coronary artery bypass grafting x2. He completed cardiac rehab in [place].  Currently prescribed Lipitor 80 mg and Plavix 75 mg daily.  He denies myalgia, fatigue, shortness of breath, or chest pain.  He is followed by cardiology on a routine basis  Lab Results  Component Value Date   CHOL 92 (L) 07/09/2020   HDL 30 (L) 07/09/2020   LDLCALC 44 07/09/2020   LDLDIRECT 66.7 11/30/2011   TRIG 89  07/09/2020   CHOLHDL 3.1 07/09/2020    Essential hypertension-takes lisinopril 20 mg and metoprolol 25 mg twice daily.  He denies dizziness, lightheadedness, headaches, syncopal episodes, or shortness of breath. He does check at home and gets readings consistently in the 130/80's.  BP Readings from Last 3 Encounters:  02/04/21 (!) 142/78  01/29/21 130/70  10/20/20 140/70   Depression-well-controlled on Wellbutrin 300 mg extended release  Insomnia -Prescribed Trazodone 50 mg. Reports that some nights he sleeps better than others. Is interested in trying to increase Trazodone   AAA-last imaging in November 2021 showed 3.5 cm x 3.2 cm focal aneurysmal dilatation of the infrarenal abdominal aorta.  All immunizations and health maintenance protocols were reviewed with the patient and needed orders were placed.  Appropriate screening laboratory values were ordered for the patient including screening of hyperlipidemia, renal function and hepatic function. If indicated by BPH, a PSA was ordered.  Medication reconciliation,  past medical history, social history, problem list and allergies were reviewed in detail with the patient  Goals were established with regard to weight loss, exercise, and  diet in compliance with medications  He will be due for repeat colonoscopy in August 2022  Review of Systems  Constitutional: Negative.   HENT: Negative.   Eyes: Negative.   Respiratory: Negative.   Cardiovascular: Negative.   Gastrointestinal: Negative.   Endocrine: Negative.   Genitourinary: Negative.   Musculoskeletal: Negative.   Skin: Negative.   Allergic/Immunologic: Negative.   Neurological: Negative.   Hematological: Negative.   Psychiatric/Behavioral: Negative.   All other systems reviewed and are negative.  Past Medical History:  Diagnosis Date  . AAA (abdominal  aortic aneurysm) ([place])   . AAA (abdominal aortic aneurysm) without rupture ([place]) 01/19/2017  . Adjustment disorder with  depressed mood 03/04/2009  . Arthritis   . Colon polyps   . CORONARY ARTERY DISEASE 07/10/2007  . Coronary artery disease involving native coronary artery of native heart without angina pectoris 07/10/2007   Qualifier: Diagnosis of  By: Scherrie Gerlach    . Depression   . DOE (dyspnea on exertion)   . Episodic mood disorder ([place]) 08/01/2014  . ERECTILE DYSFUNCTION 07/10/2007  . Extrinsic asthma, unspecified 09/10/2008  . HYPERLIPIDEMIA 07/10/2007  . HYPERTENSION 07/10/2007  . Myocardial infarction ([employer]) 2016   when had angioplasty- was told had a silent MI  . OA (osteoarthritis) of hip 04/19/2018  . Onychomycosis 05/14/2014  . Pain in joint of left knee 01/19/2017  . Personal history of colonic polyps 07/08/2014   1997, 2001 8 mm transverse serrated polyp right colon 2010 07/08/2014 no polyps - repeat colon 2022     . S/P CABG x 2 05/27/2020  . TOBACCO USE 12/15/2009    Social History   Socioeconomic History  . Marital status: Married    Spouse name: Not on file  . Number of children: Not on file  . Years of education: Not on file  . Highest education level: Not on file  Occupational History  . Not on file  Tobacco Use  . Smoking status: Former Smoker    Packs/day: 0.50    Quit date: 1991    Years since quitting: 31.2  . Smokeless tobacco: Never Used  Vaping Use  . Vaping Use: Never used  Substance and Sexual Activity  . Alcohol use: Yes    Alcohol/week: 1.0 standard drink    Types: 1 Cans of beer per week    Comment: Gin 2 x week  . Drug use: No  . Sexual activity: Yes    Comment: regular exercise - yes  Other Topics Concern  . Not on file  Social History Narrative   Retired from being an Financial controller of a small business    Married    No kids       He likes to play golf and rides bike       Social Determinants of Radio broadcast assistant Strain: Low Risk   . Difficulty of Paying Living Expenses: Not hard at all  Food Insecurity: No Food Insecurity  . Worried About  Charity fundraiser in the Last Year: Never true  . Ran Out of Food in the Last Year: Never true  Transportation Needs: No Transportation Needs  . Lack of Transportation (Medical): No  . Lack of Transportation (Non-Medical): No  Physical Activity: Sufficiently Active  . Days of Exercise per Week: 4 days  . Minutes of Exercise per Session: 60 min  Stress: No Stress Concern Present  . Feeling of Stress : Not at all  Social Connections: Moderately Isolated  . Frequency of Communication with Friends and Family: Three times a week  . Frequency of Social Gatherings with Friends and Family: Three times a week  . Attends Religious Services: Never  . Active Member of Clubs or Organizations: No  . Attends Archivist Meetings: Never  . Marital Status: Married  Human resources officer Violence: Not At Risk  . Fear of Current or Ex-Partner: No  . Emotionally Abused: No  . Physically Abused: No  . Sexually Abused: No    Past Surgical History:  Procedure Laterality Date  .  COLONOSCOPY    . CORONARY ANGIOPLASTY  12/2014  . CORONARY ARTERY BYPASS GRAFT  11/15/89   5 vessels  . CORONARY ARTERY BYPASS GRAFT N/A 05/27/2020   Procedure: REDO CORONARY ARTERY BYPASS GRAFTING (CABG), ON PUMP, TIMES TWO, USING RIGHT INTERNAL MAMMARY ARTERY AND LEFT RADIAL ARTERY (OPEN);  Surgeon: Grace Isaac, MD;  Location: [facility];  Service: Open Heart Surgery;  Laterality: N/A;  Left Radial Artery to PDA FREE RIMA to OM  . HYDROCELE EXCISION / REPAIR  2011  . INGUINAL HERNIA REPAIR Left 1985  . LEFT HEART CATH AND CORS/GRAFTS ANGIOGRAPHY N/A 05/13/2020   Procedure: LEFT HEART CATH AND CORS/GRAFTS ANGIOGRAPHY;  Surgeon: Belva Crome, MD;  Location: [facility];  Service: Cardiovascular;  Laterality: N/A;  . MEDIASTERNOTOMY N/A 05/27/2020   Procedure: MEDIAN STERNOTOMY;  Surgeon: Grace Isaac, MD;  Location: [facility];  Service: Open Heart Surgery;  Laterality: N/A;  Redo Median Sternotomy  . RADIAL  ARTERY HARVEST Left 05/27/2020   Procedure: RADIAL ARTERY HARVEST;  Surgeon: Grace Isaac, MD;  Location: [facility];  Service: Open Heart Surgery;  Laterality: Left;  . TEE WITHOUT CARDIOVERSION N/A 05/27/2020   Procedure: TRANSESOPHAGEAL ECHOCARDIOGRAM (TEE);  Surgeon: Grace Isaac, MD;  Location: [facility];  Service: Open Heart Surgery;  Laterality: N/A;  . TOTAL HIP ARTHROPLASTY Right 04/19/2018   Procedure: RIGHT TOTAL HIP ARTHROPLASTY ANTERIOR APPROACH;  Surgeon: Gaynelle Arabian, MD;  Location: [facility];  Service: Orthopedics;  Laterality: Right;    Family History  Problem Relation Age of Onset  . Coronary artery disease Father        cabg  . Cancer Mother        Breast and or lung   . Depression Mother   . Hyperlipidemia Other   . Hypertension Other   . Depression Paternal Grandfather   . Colon cancer Neg Hx     No Known Allergies  Current Outpatient Medications on File Prior to Visit  Medication Sig Dispense Refill  . acetaminophen (TYLENOL) 325 MG tablet Take 650 mg by mouth every 6 (six) hours as needed for moderate pain or headache.     Marland Kitchen atorvastatin (LIPITOR) 80 MG tablet TAKE 1 TABLET DAILY 90 tablet 3  . buPROPion (WELLBUTRIN XL) 300 MG 24 hr tablet Take 1 tablet (300 mg total) by mouth daily. 90 tablet 0  . calcipotriene (DOVONOX) 0.005 % cream Apply 1 application topically 2 (two) times daily.    . clobetasol cream (TEMOVATE) 4.48 % Apply 1 application topically daily.    . clopidogrel (PLAVIX) 75 MG tablet Take 1 tablet (75 mg total) by mouth daily. 90 tablet 3  . Melatonin 10 MG TABS Take 20 mg by mouth at bedtime.    . metoprolol tartrate (LOPRESSOR) 25 MG tablet Take 1 tablet (25 mg total) by mouth 2 (two) times daily. 180 tablet 3  . Multiple Vitamin (MULTIVITAMIN) capsule Take 1 capsule by mouth daily.    . nitroGLYCERIN (NITROSTAT) 0.4 MG SL tablet Place 1 tablet (0.4 mg total) under the tongue every 5 (five) minutes as needed. 25 tablet 0  . sildenafil (VIAGRA)  100 MG tablet Take 100 mg by mouth daily as needed for erectile dysfunction.    . traZODone (DESYREL) 50 MG tablet TAKE 1 TABLET AT BEDTIME ASNEEDED FOR SLEEP 90 tablet 0  . lisinopril (ZESTRIL) 20 MG tablet Take 1 tablet (20 mg total) by mouth daily. 90 tablet 3   No current facility-administered  medications on file prior to visit.    BP (!) 142/78 (BP Location: Left Arm, Patient Position: Sitting, Cuff Size: Normal)   Pulse (!) 57   Temp 98.5 F (36.9 C) (Oral)   Ht 5\' 10"  (1.778 m)   Wt 214 lb (97.1 kg)   SpO2 96%   BMI 30.71 kg/m       Objective:   Physical Exam Vitals and nursing note reviewed.  Constitutional:      General: He is not in acute distress.    Appearance: Normal appearance. He is well-developed and overweight.  HENT:     Head: Normocephalic and atraumatic.     Right Ear: Tympanic membrane, ear canal and external ear normal. There is no impacted cerumen.     Left Ear: Tympanic membrane, ear canal and external ear normal. There is no impacted cerumen.     Nose: Nose normal. No congestion or rhinorrhea.     Mouth/Throat:     Mouth: Mucous membranes are moist.     Pharynx: Oropharynx is clear. No oropharyngeal exudate or posterior oropharyngeal erythema.  Eyes:     General:        Right eye: No discharge.        Left eye: No discharge.     Extraocular Movements: Extraocular movements intact.     Conjunctiva/sclera: Conjunctivae normal.     Pupils: Pupils are equal, round, and reactive to light.  Neck:     Vascular: No carotid bruit.     Trachea: No tracheal deviation.  Cardiovascular:     Rate and Rhythm: Normal rate and regular rhythm.     Pulses: Normal pulses.     Heart sounds: Normal heart sounds. No murmur heard. No friction rub. No gallop.   Pulmonary:     Effort: Pulmonary effort is normal. No respiratory distress.     Breath sounds: Normal breath sounds. No stridor. No wheezing, rhonchi or rales.  Chest:     Chest wall: No tenderness.   Abdominal:     General: Bowel sounds are normal. There is no distension.     Palpations: Abdomen is soft. There is no mass.     Tenderness: There is no abdominal tenderness. There is no right CVA tenderness, left CVA tenderness, guarding or rebound.     Hernia: No hernia is present.  Musculoskeletal:        General: No swelling, tenderness, deformity or signs of injury. Normal range of motion.     Right lower leg: No edema.     Left lower leg: No edema.  Lymphadenopathy:     Cervical: No cervical adenopathy.  Skin:    General: Skin is warm and dry.     Capillary Refill: Capillary refill takes less than 2 seconds.     Coloration: Skin is not jaundiced or pale.     Findings: Bruising present. No erythema, lesion or rash.  Neurological:     General: No focal deficit present.     Mental Status: He is alert and oriented to person, place, and time.     Cranial Nerves: No cranial nerve deficit.     Sensory: No sensory deficit.     Motor: No weakness.     Coordination: Coordination normal.     Gait: Gait normal.     Deep Tendon Reflexes: Reflexes normal.  Psychiatric:        Mood and Affect: Mood normal.        Behavior: Behavior normal.  Thought Content: Thought content normal.        Judgment: Judgment normal.           Assessment & Plan:  1. Routine general medical examination at a health care facility - Continue to stay active and eat a heart healthy diet  - CBC with Differential/Platelet; Future - Comprehensive metabolic panel; Future - Lipid panel; Future - TSH; Future - Lipid panel - Comprehensive metabolic panel - TSH - CBC with Differential/Platelet  2. Essential hypertension - controlled no change in medication  - CBC with Differential/Platelet; Future - Comprehensive metabolic panel; Future - Lipid panel; Future - TSH; Future - Lipid panel - Comprehensive metabolic panel - TSH - CBC with Differential/Platelet - metoprolol tartrate (LOPRESSOR) 25  MG tablet; Take 1 tablet (25 mg total) by mouth 2 (two) times daily.  Dispense: 180 tablet; Refill: 3  3. Depression, recurrent ([place]) - controlled. No change in medication  - CBC with Differential/Platelet; Future - Comprehensive metabolic panel; Future - Lipid panel; Future - TSH; Future - buPROPion (WELLBUTRIN XL) 300 MG 24 hr tablet; Take 1 tablet (300 mg total) by mouth daily.  Dispense: 90 tablet; Refill: 1 - Lipid panel - Comprehensive metabolic panel - TSH - CBC with Differential/Platelet  4. Primary insomnia - Will trial increase Trazodone to 100 mg QHS - traZODone (DESYREL) 100 MG tablet; Take 1 tablet (100 mg total) by mouth at bedtime as needed for sleep.  Dispense: 90 tablet; Refill: 1  5. Prostate cancer screening  - PSA; Future - PSA  6. AAA (abdominal aortic aneurysm) without rupture ([facility]) - Stable. Repeat imaging yearly.  - CBC with Differential/Platelet; Future - Comprehensive metabolic panel; Future - Lipid panel; Future - TSH; Future - Lipid panel - Comprehensive metabolic panel - TSH - CBC with Differential/Platelet - clopidogrel (PLAVIX) 75 MG tablet; Take 1 tablet (75 mg total) by mouth daily.  Dispense: 90 tablet; Refill: 3 - metoprolol tartrate (LOPRESSOR) 25 MG tablet; Take 1 tablet (25 mg total) by mouth 2 (two) times daily.  Dispense: 180 tablet; Refill: 3  7. Coronary artery disease involving native coronary artery of native heart without angina pectoris - Stable. Continue with current treatment  - CBC with Differential/Platelet; Future - Comprehensive metabolic panel; Future - Lipid panel; Future - TSH; Future - Lipid panel - Comprehensive metabolic panel - TSH - CBC with Differential/Platelet - clopidogrel (PLAVIX) 75 MG tablet; Take 1 tablet (75 mg total) by mouth daily.  Dispense: 90 tablet; Refill: 3 - metoprolol tartrate (LOPRESSOR) 25 MG tablet; Take 1 tablet (25 mg total) by mouth 2 (two) times daily.  Dispense: 180 tablet; Refill:  3 - atorvastatin (LIPITOR) 80 MG tablet; TAKE 1 TABLET DAILY  Dispense: 90 tablet; Refill: 3  8. Need for hepatitis C screening test  - Hep C Antibody; Future - Hep C Antibody  9. Pure hypercholesterolemia - Continue with lipitor 80 mg  - CBC with Differential/Platelet; Future - Comprehensive metabolic panel; Future - Lipid panel; Future - TSH; Future - atorvastatin (LIPITOR) 80 MG tablet; TAKE 1 TABLET DAILY  Dispense: 90 tablet; Refill: 3   Dorothyann Peng, NP

## 2021-02-05 ENCOUNTER — Encounter: Payer: Self-pay | Admitting: Cardiology

## 2021-02-05 ENCOUNTER — Ambulatory Visit: Payer: Medicare HMO | Admitting: Cardiology

## 2021-02-05 VITALS — BP 120/80 | HR 55 | Ht 70.0 in | Wt 216.0 lb

## 2021-02-05 DIAGNOSIS — I2510 Atherosclerotic heart disease of native coronary artery without angina pectoris: Secondary | ICD-10-CM | POA: Diagnosis not present

## 2021-02-05 DIAGNOSIS — I10 Essential (primary) hypertension: Secondary | ICD-10-CM | POA: Diagnosis not present

## 2021-02-05 DIAGNOSIS — I714 Abdominal aortic aneurysm, without rupture, unspecified: Secondary | ICD-10-CM

## 2021-02-05 DIAGNOSIS — E7800 Pure hypercholesterolemia, unspecified: Secondary | ICD-10-CM | POA: Diagnosis not present

## 2021-02-05 DIAGNOSIS — Z951 Presence of aortocoronary bypass graft: Secondary | ICD-10-CM | POA: Diagnosis not present

## 2021-02-05 LAB — HEPATITIS C ANTIBODY
Hepatitis C Ab: NONREACTIVE
SIGNAL TO CUT-OFF: 0 (ref <1.00)

## 2021-02-05 MED ORDER — METOPROLOL SUCCINATE ER 25 MG PO TB24: 25.0000 mg | ORAL_TABLET | Freq: Every day | ORAL | 3 refills | Status: DC

## 2021-02-05 MED ORDER — FUROSEMIDE 20 MG PO TABS: 20.0000 mg | ORAL_TABLET | ORAL | 1 refills | Status: DC | PRN

## 2021-02-05 MED ORDER — FUROSEMIDE 20 MG PO TABS
20.0000 mg | ORAL_TABLET | Freq: Every day | ORAL | 2 refills | Status: DC | PRN
Start: 2021-02-05 — End: 2021-05-20

## 2021-02-05 NOTE — Patient Instructions (Addendum)
Medication Instructions:  Stop Metoprolol Tartrate Start Metoprolol Succinate 25 mg by mouth daily Start Lasix 20mg  daily as needed for swelling *If you need a refill on your cardiac medications before your next appointment, please call your pharmacy*   Lab Work: None  If you have labs (blood work) drawn today and your tests are completely normal, you will receive your results only by: Marland Kitchen MyChart Message (if you have MyChart) OR . A paper copy in the mail If you have any lab test that is abnormal or we need to change your treatment, we will call you to review the results.   Testing/Procedures: Your physician has requested that you have an echocardiogram. Echocardiography is a painless test that uses sound waves to create images of your heart. It provides your doctor with information about the size and shape of your heart and how well your heart's chambers and valves are working. This procedure takes approximately one hour. There are no restrictions for this procedure.    Follow-Up: At [facility], you and your health needs are our priority.  As part of our continuing mission to provide you with exceptional heart care, we have created designated Provider Care Teams.  These Care Teams include your primary Cardiologist (physician) and Advanced Practice Providers (APPs -  Physician Assistants and Nurse Practitioners) who all work together to provide you with the care you need, when you need it.  We recommend signing up for the patient portal called "MyChart".  Sign up information is provided on this After Visit Summary.  MyChart is used to connect with patients for Virtual Visits (Telemedicine).  Patients are able to view lab/test results, encounter notes, upcoming appointments, etc.  Non-urgent messages can be sent to your provider as well.   To learn more about what you can do with MyChart, go to [email or web address].    Your next appointment:   6 month(s)  The format for your next  appointment:   In Person  Provider:   You will see one of the following Advanced Practice Providers on your designated Care Team:    Richardson Dopp, PA-C  Vin Crawford, [place]

## 2021-02-09 ENCOUNTER — Telehealth: Payer: Self-pay | Admitting: Cardiology

## 2021-02-09 NOTE — Telephone Encounter (Signed)
New message:     Dave Robles from [facility] calling to get some clarification on Furosemide and Metoprolol ER 25 mg and 25 mg release. concering what they should be taken. Different direction. [phone] option 2 reference number [id number].

## 2021-02-09 NOTE — Telephone Encounter (Signed)
Returned call to [employer].  Confirmed current medications.

## 2021-02-11 ENCOUNTER — Telehealth: Payer: Self-pay | Admitting: Adult Health

## 2021-02-11 NOTE — Telephone Encounter (Signed)
Called and informed [facility] that the tartrate version is to be canceled.

## 2021-02-11 NOTE — Telephone Encounter (Signed)
You placed order for - metoprolol tartrate (LOPRESSOR) 25 MG tablet; Take 1 tablet (25 mg total) by mouth 2 (two) times daily.  Dispense: 180 tablet; Refill: 3 on 02/04/21. And then dr Johney Frame sent the ER version. Which do you prefer the pt take?

## 2021-02-11 NOTE — Telephone Encounter (Signed)
Pharmacy is calling in stating that the pt has picked up Rx metoprolol succiante (TOPROL XL) 25 MG that was prescribed by Gwyndolyn Kaufman, MD and it has alerted the pharmacy that we have sent in the same Rx for ER (Early Release) and the pharmacy is wanting to know which one of the medication that pt should be taking.  The pharmacy would like to have a call back and reference the following #[id number] so that they can address and get which one the pt should be taking.

## 2021-02-11 NOTE — Telephone Encounter (Signed)
ER version

## 2021-02-23 DIAGNOSIS — I2510 Atherosclerotic heart disease of native coronary artery without angina pectoris: Secondary | ICD-10-CM

## 2021-02-23 DIAGNOSIS — Z951 Presence of aortocoronary bypass graft: Secondary | ICD-10-CM

## 2021-02-23 MED ORDER — METOPROLOL SUCCINATE ER 25 MG PO TB24
25.0000 mg | ORAL_TABLET | Freq: Every day | ORAL | 1 refills | Status: DC
Start: 2021-02-23 — End: 2021-08-06

## 2021-02-24 NOTE — Telephone Encounter (Signed)
Absolutely fine for him to take the tartrate until he gets the new medication. There is no rush or urgency to make the switch. Thank you for handling the mix-up!

## 2021-02-24 NOTE — Telephone Encounter (Signed)
Freada Bergeron, MD 2 hours ago (9:12 AM)   HP    Absolutely fine for him to take the tartrate until he gets the new medication. There is no rush or urgency to make the switch. Thank you for handling the mix-up!      Pt made aware via message that per Dr. Johney Frame, it is completely fine for him to take the tartrate, if his insurance and pharmacy will not cover and fill his toprol xl.   Advised him to keep [place] posted on this.

## 2021-02-24 NOTE — Telephone Encounter (Signed)
Metoprolol Succinate 25 mg po daily was sent into the incorrect pharmacy by Triage RN Susie Bartmess.  RN sent this med into the pts old mail order pharmacy, [facility].  This was to be sent to [facility].  I tried submitting this to [facility] and the [facility] to refill, and both denied filling this, stating next refill cannot be submitted until 5/18.  Pt was changed  from lopressor to succinate recently by Dr. Johney Frame, which RN then sent to wrong Pharmacy, [facility]. Per [facility], the reason they are denying this, is because original script for Toprol XL 25 mg po daily was sent into [facility], instead of them. Advised the pt to see if he could call [facility] today and request for them to send this medication to him, or release this so that we can [facility] to be [facility] without penalty.  Message sent to the pt instructing him to do this.  Will await pt follow-up message.

## 2021-03-04 ENCOUNTER — Ambulatory Visit (HOSPITAL_COMMUNITY): Payer: Medicare HMO | Attending: Cardiovascular Disease

## 2021-03-04 ENCOUNTER — Other Ambulatory Visit: Payer: Self-pay

## 2021-03-04 DIAGNOSIS — Z951 Presence of aortocoronary bypass graft: Secondary | ICD-10-CM | POA: Diagnosis not present

## 2021-03-04 DIAGNOSIS — I2510 Atherosclerotic heart disease of native coronary artery without angina pectoris: Secondary | ICD-10-CM

## 2021-03-04 LAB — ECHOCARDIOGRAM COMPLETE
Area-P 1/2: 3.42 cm2
S' Lateral: 2.8 cm

## 2021-03-04 MED ORDER — PERFLUTREN LIPID MICROSPHERE
1.0000 mL | INTRAVENOUS | Status: AC | PRN
  Administered 2021-03-04: 2 mL via INTRAVENOUS

## 2021-03-12 IMAGING — CR DG CHEST 2V
2 series · 2 of 2 positions shown · non-contrast
Comparison: April 30, 2020

CLINICAL DATA: Patient having CABG on [REDACTED] May 27, 2020.

EXAM:
CHEST - 2 VIEW

[w chest pa]
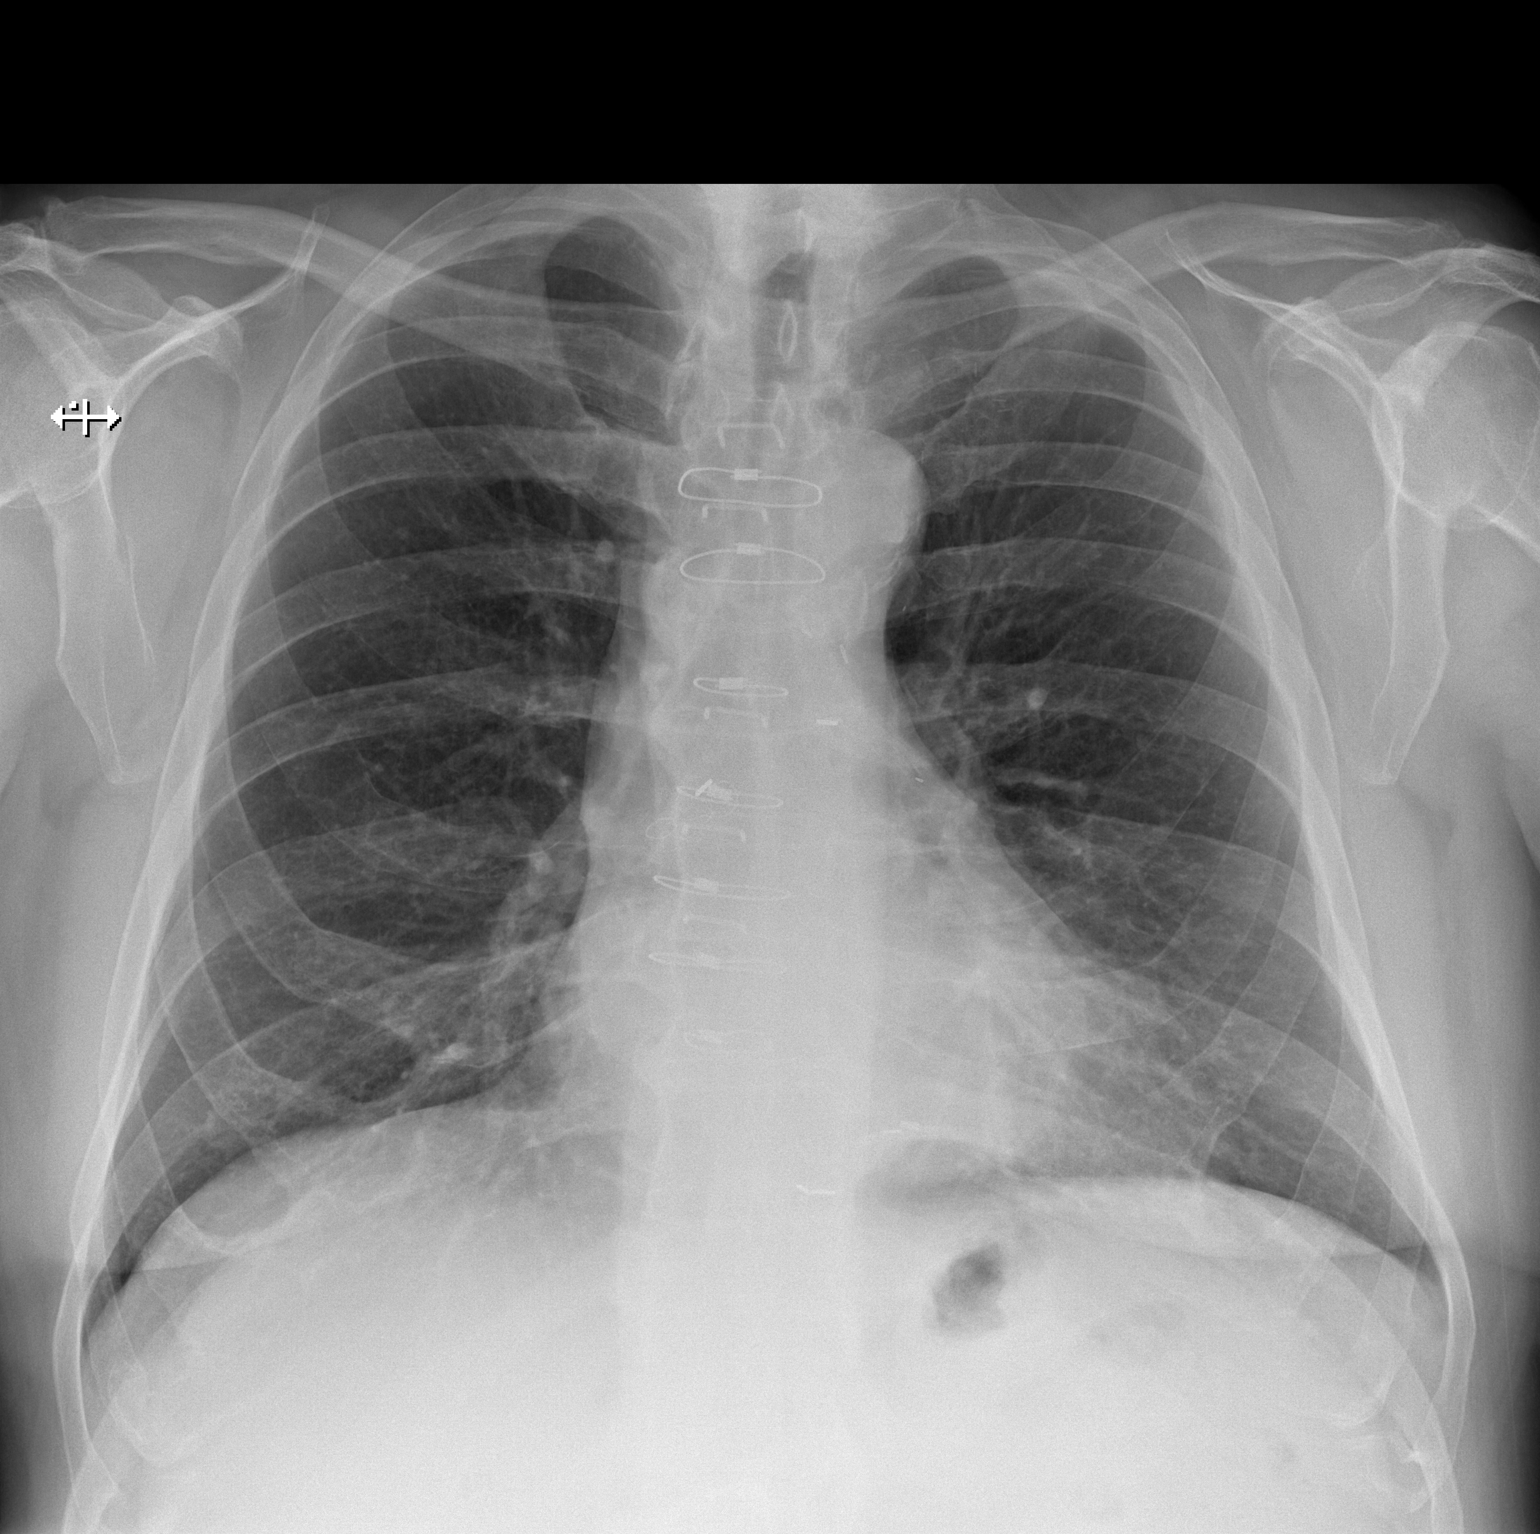

[w chest lat]
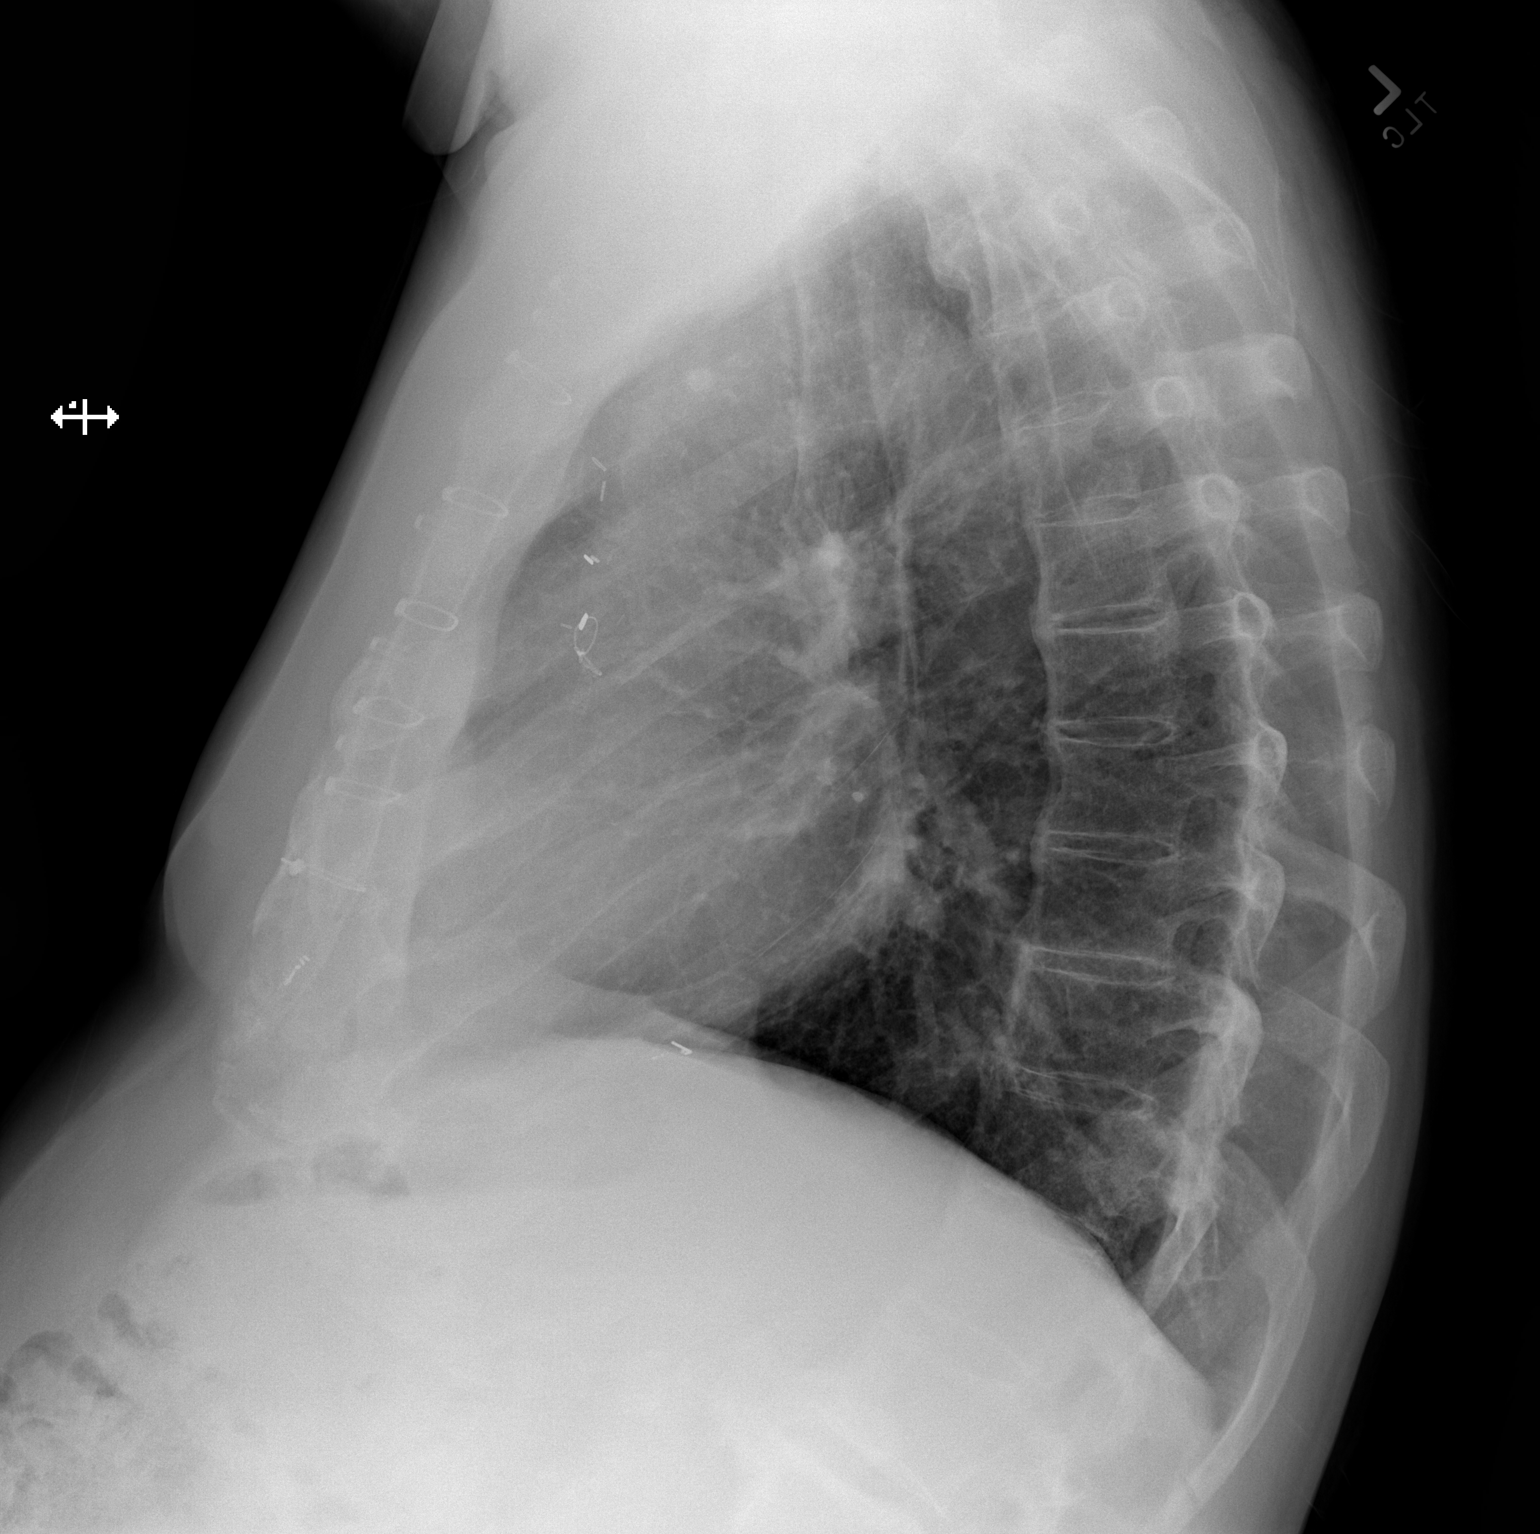

[2 of 2 positions shown; findings below may reference images not displayed]

FINDINGS: The heart size and mediastinal contours are stable. Both lungs are
clear. The visualized skeletal structures are stable.
IMPRESSION: No active cardiopulmonary disease.

## 2021-03-13 DIAGNOSIS — C44310 Basal cell carcinoma of skin of unspecified parts of face: Secondary | ICD-10-CM | POA: Diagnosis not present

## 2021-03-13 DIAGNOSIS — C44519 Basal cell carcinoma of skin of other part of trunk: Secondary | ICD-10-CM | POA: Diagnosis not present

## 2021-03-16 IMAGING — DX DG CHEST 1V PORT
1 series · 1 of 1 positions shown · non-contrast
Comparison: 05/23/2020

CLINICAL DATA: Status post coronary artery bypass grafting.

EXAM:
PORTABLE CHEST 1 VIEW

[chest ap]
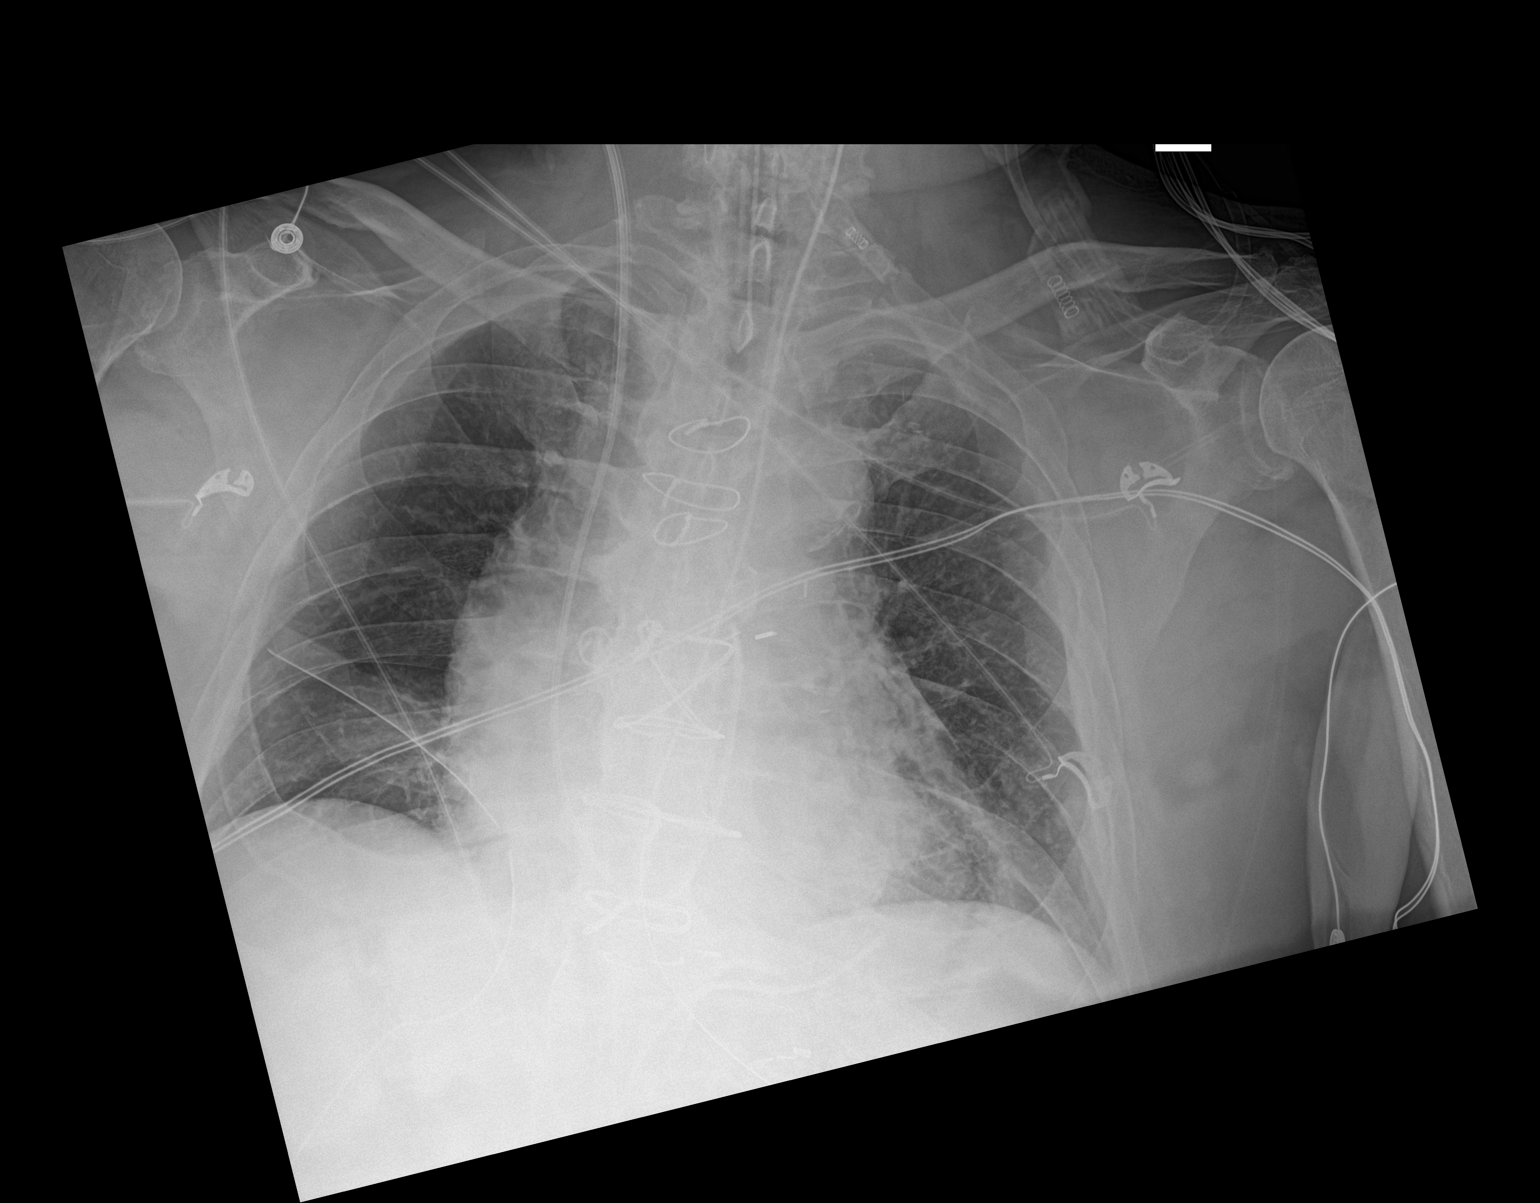

[1 of 1 positions shown; findings below may reference images not displayed]

FINDINGS: Endotracheal tube is seen 8.3 cm above the carina. Right internal
jugular Swan-Ganz catheter tip is seen within the expected main
pulmonary artery. Nasogastric tube extends into the upper abdomen
beyond the margin of the examination. Bibasilar chest tubes are in
place.

Lung volumes are small. Trace asymmetric interstitial pulmonary
infiltrate within the left lung likely represents asymmetric
pulmonary edema. No pneumothorax or pleural effusion. Mild
pneumomediastinum is likely postsurgical in nature. Cardiac size is
within normal limits. Coronary artery bypass grafting has been
performed.
IMPRESSION: Support lines and tubes as described above. Advancement of the
endotracheal tube could be considered.

Pulmonary hypoinflation. Superimposed mild asymmetric left
interstitial pulmonary edema.

Bibasilar chest tubes in place. No pneumothorax. Trace
pneumomediastinum.

## 2021-03-18 IMAGING — DX DG CHEST 1V PORT
1 series · 1 of 1 positions shown · non-contrast
Comparison: Chest x-ray 05/28/2020

CLINICAL DATA: Bypass surgery.

EXAM:
PORTABLE CHEST 1 VIEW

[chest ap]
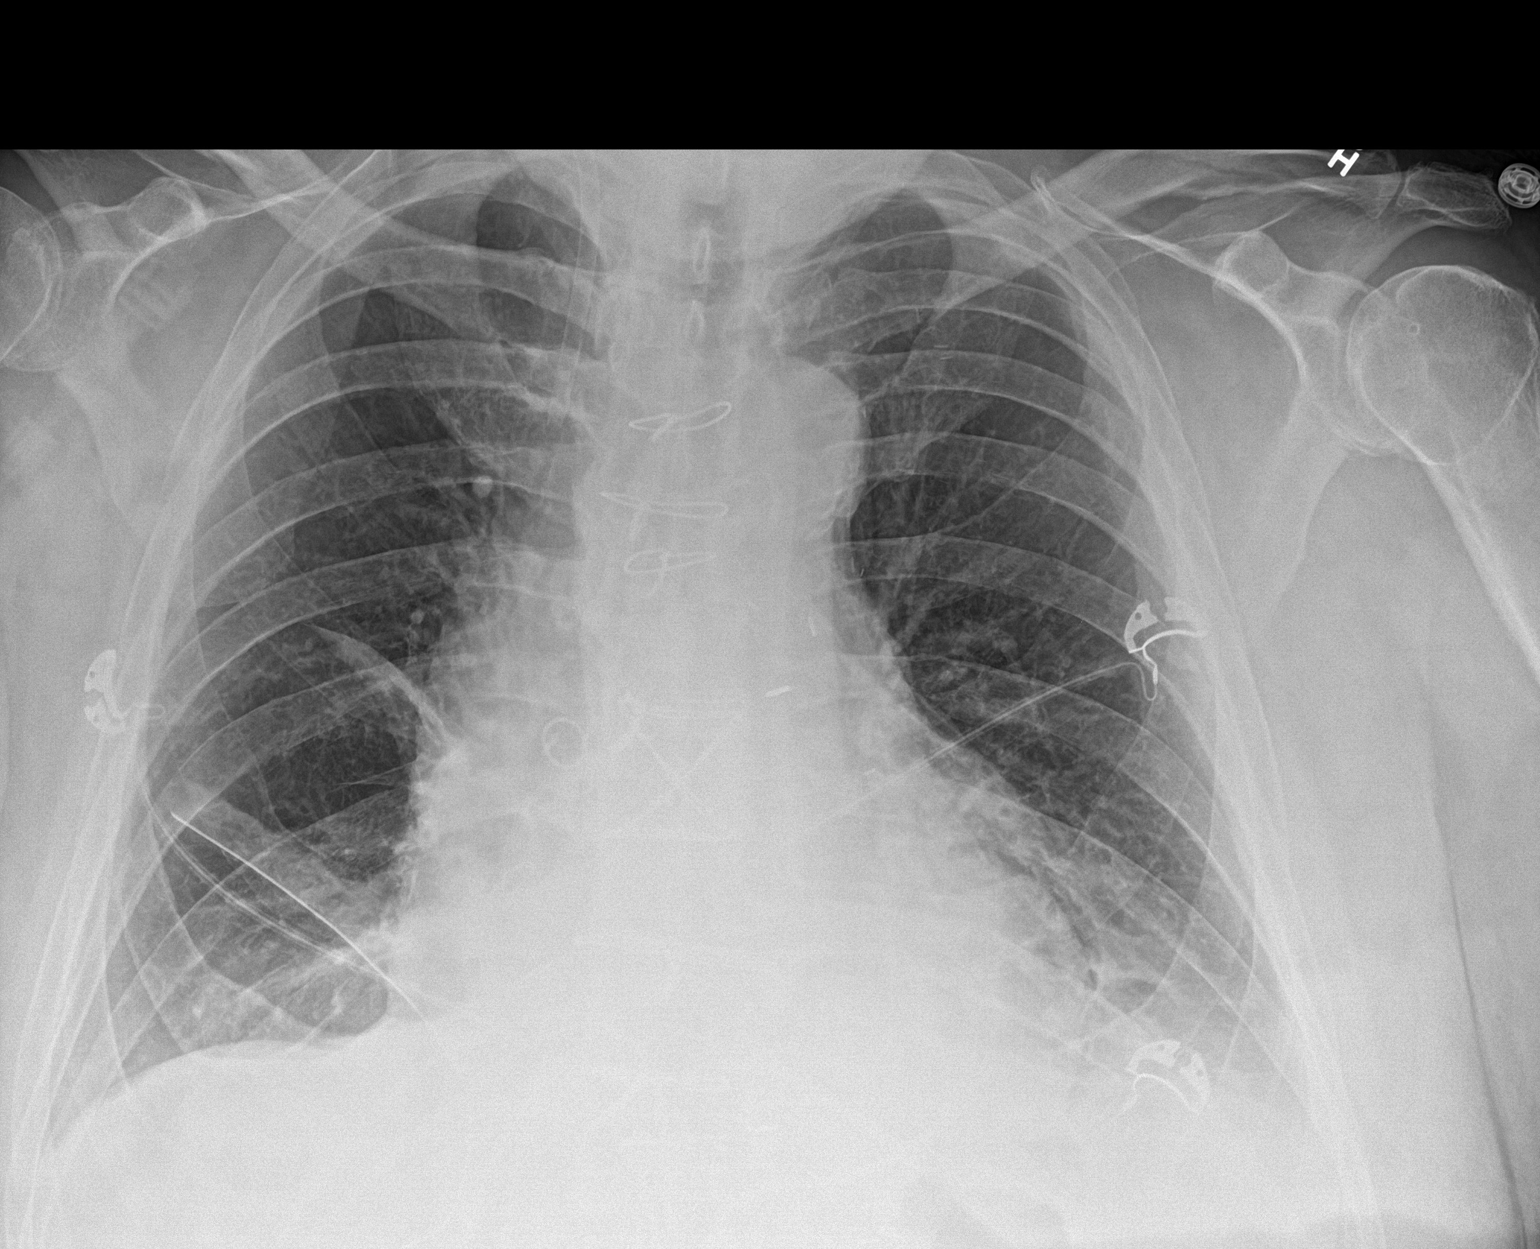

[1 of 1 positions shown; findings below may reference images not displayed]

FINDINGS: The right IJ Swan-Ganz catheter has been removed. The right IJ
Cordis is still in place.

Stable right-sided chest tube. No pneumothorax.

Stable mild cardiac enlargement and prominent mediastinal contours.

Persistent small left effusion and bibasilar atelectasis.
IMPRESSION: 1. Stable right-sided chest tube without pneumothorax.
2. Persistent small left effusion and bibasilar atelectasis.

## 2021-04-21 DIAGNOSIS — H40023 Open angle with borderline findings, high risk, bilateral: Secondary | ICD-10-CM | POA: Diagnosis not present

## 2021-04-21 DIAGNOSIS — H40053 Ocular hypertension, bilateral: Secondary | ICD-10-CM | POA: Diagnosis not present

## 2021-04-21 DIAGNOSIS — H35033 Hypertensive retinopathy, bilateral: Secondary | ICD-10-CM | POA: Diagnosis not present

## 2021-04-21 DIAGNOSIS — H43812 Vitreous degeneration, left eye: Secondary | ICD-10-CM | POA: Diagnosis not present

## 2021-04-22 DIAGNOSIS — C44310 Basal cell carcinoma of skin of unspecified parts of face: Secondary | ICD-10-CM | POA: Diagnosis not present

## 2021-04-22 DIAGNOSIS — C44319 Basal cell carcinoma of skin of other parts of face: Secondary | ICD-10-CM | POA: Diagnosis not present

## 2021-04-22 DIAGNOSIS — C44519 Basal cell carcinoma of skin of other part of trunk: Secondary | ICD-10-CM | POA: Diagnosis not present

## 2021-05-17 ENCOUNTER — Encounter: Payer: Self-pay | Admitting: Adult Health

## 2021-05-19 ENCOUNTER — Other Ambulatory Visit: Payer: Self-pay | Admitting: Adult Health

## 2021-05-19 DIAGNOSIS — T1490XD Injury, unspecified, subsequent encounter: Secondary | ICD-10-CM | POA: Diagnosis not present

## 2021-05-19 DIAGNOSIS — F339 Major depressive disorder, recurrent, unspecified: Secondary | ICD-10-CM

## 2021-05-19 MED ORDER — BUPROPION HCL ER (XL) 300 MG PO TB24: 300.0000 mg | ORAL_TABLET | Freq: Every day | ORAL | 1 refills | Status: DC

## 2021-05-20 ENCOUNTER — Other Ambulatory Visit: Payer: Self-pay

## 2021-05-20 DIAGNOSIS — Z951 Presence of aortocoronary bypass graft: Secondary | ICD-10-CM

## 2021-05-20 DIAGNOSIS — I2510 Atherosclerotic heart disease of native coronary artery without angina pectoris: Secondary | ICD-10-CM

## 2021-05-20 MED ORDER — FUROSEMIDE 20 MG PO TABS: 20.0000 mg | ORAL_TABLET | Freq: Every day | ORAL | 8 refills | Status: DC | PRN

## 2021-07-01 ENCOUNTER — Telehealth: Payer: Self-pay | Admitting: Adult Health

## 2021-07-01 NOTE — Chronic Care Management (AMB) (Signed)
  Chronic Care Management   Note  07/01/2021 Name: DAEMEON BRIDWELL MRN: [id number] DOB: 01-15-1946  SIGFREDO BARGAR is a 75 y.o. year old male who is a primary care patient of Dorothyann Peng, NP. I reached out to [employer] by phone today in response to a referral sent by Mr. Kayla Mertel Voorhees's PCP, Dorothyann Peng, NP.   Mr. Hoofnagle was given information about Chronic Care Management services today including:  CCM service includes personalized support from designated clinical staff supervised by his physician, including individualized plan of care and coordination with other care providers 24/7 contact phone numbers for assistance for urgent and routine care needs. Service will only be billed when office clinical staff spend 20 minutes or more in a month to coordinate care. Only one practitioner may furnish and bill the service in a calendar month. The patient may stop CCM services at any time (effective at the end of the month) by phone call to the office staff.   Patient wishes to consider information provided and/or speak with a member of the care team before deciding about enrollment in care management services.   Follow up plan:   Tatjana Secretary/administrator

## 2021-07-09 ENCOUNTER — Encounter: Payer: Self-pay | Admitting: Adult Health

## 2021-07-13 NOTE — Telephone Encounter (Signed)
-----   Message from [facility] sent at 07/13/2021  9:45 AM EDT ----- Regarding: RE: needs OV with Scott or APP on [facility] care[facility] for sometime in Dec--due in Sept Hey girly!  I scheduled the patient for his AAA Duplex for 12/06 at 8:00am. As far as the appointment with Richardson Dopp, he is booked out until January and Mr. Falkowitz will be out of town from January - March. He did not really want to schedule prior to the test either as he states that he see's his cardiologist in [place] in October and will have him send over those records from that appointment. He will also give a call in October/November to try and schedule with Nicki Reaper in March since his schedule is only out until 03/03 and Mr. Montour will not be back until 03/15.  ----- Message ----- From: Nuala Alpha, LPN Sent: [id number]   8:08 AM EDT To: Neale Burly, # Subject: needs OV with Nicki Reaper or APP on [facility] ca#  This pt needs his 6 month recall to see APP Scott or whomever, due in September but pt okay to see them in early Dec.  Can you please call and schedule his follow-up appt?  Also he will be due to have AAA duplex around that time, and that is also recalled and will need to be scheduled.  Can you arrange both please and shoot me the date?  Thanks for all you do, Karlene Einstein

## 2021-07-23 NOTE — Telephone Encounter (Signed)
Was able to get the pt his 6 month follow-up appt with Richardson Dopp PA-C (due in Sept), for 9/28 at 3:15 pm.  Pt aware to arrive 15 mins prior to this appt.  Pt verbalized understanding and agrees with this plan.

## 2021-07-30 IMAGING — CT CT ABD-PELV W/O CM
2 of 4 series · 16 of 46 positions shown, 18 images · non-contrast
Comparison: None.

CLINICAL DATA: Left lower quadrant pain.

EXAM:
CT ABDOMEN AND PELVIS WITHOUT CONTRAST
TECHNIQUE: Multidetector CT imaging of the abdomen and pelvis was performed
following the standard protocol without IV contrast.

[Series 2: axial (person_name) (person_name) · axial · 0.80mm/px · z∈[+634,+1039]mm · 13 of 89 slices shown, 15 images]
[im 4/89  soft-tissue]
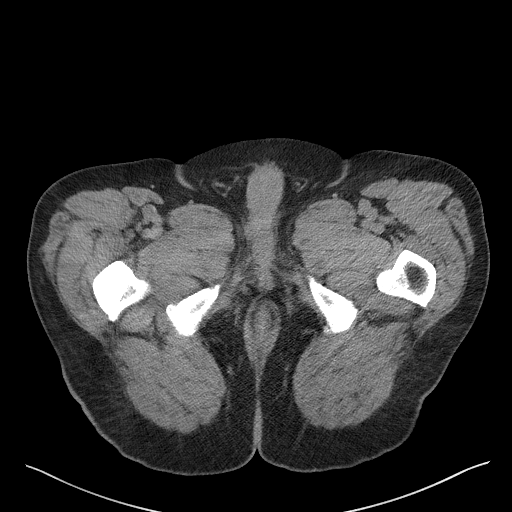
[im 4/89  bone]
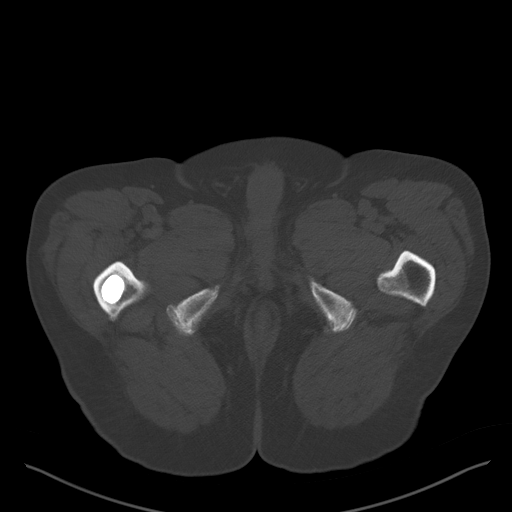
[im 11/89  soft-tissue]
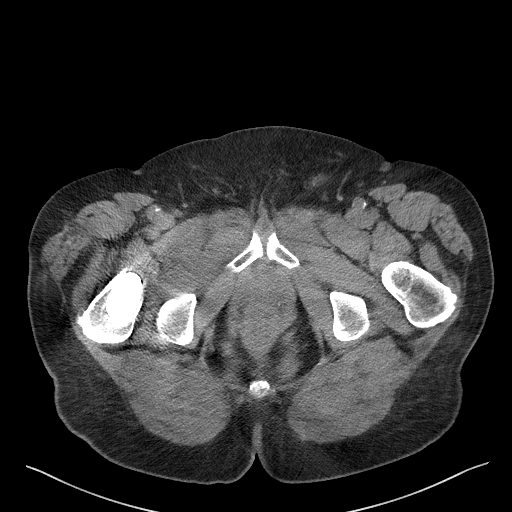
[im 18/89  soft-tissue]
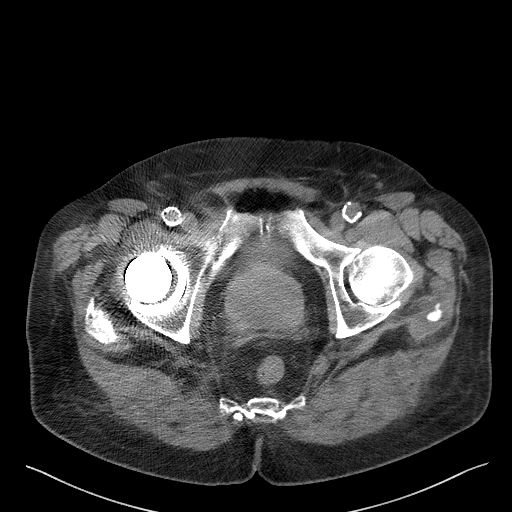
[im 25/89  soft-tissue]
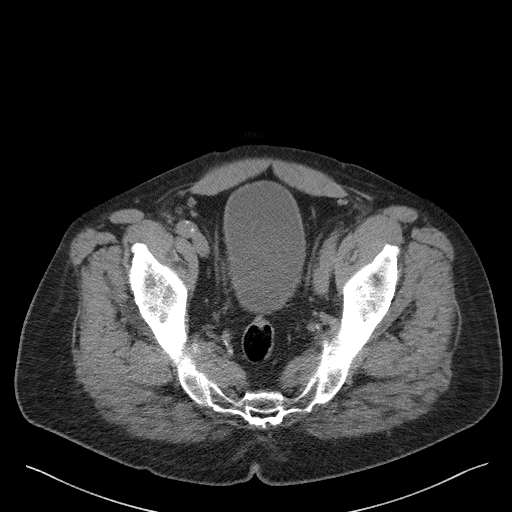
[im 32/89  soft-tissue]
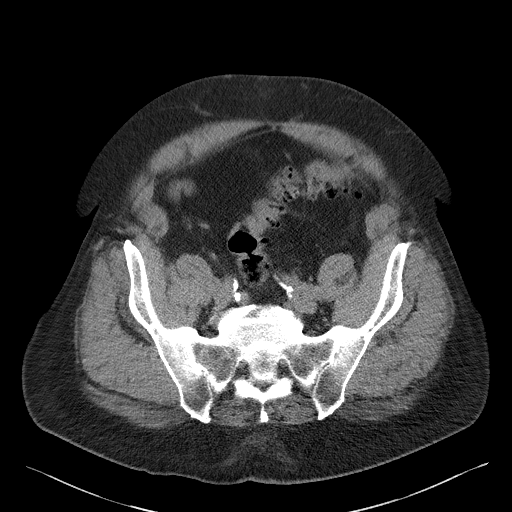
[im 39/89  soft-tissue]
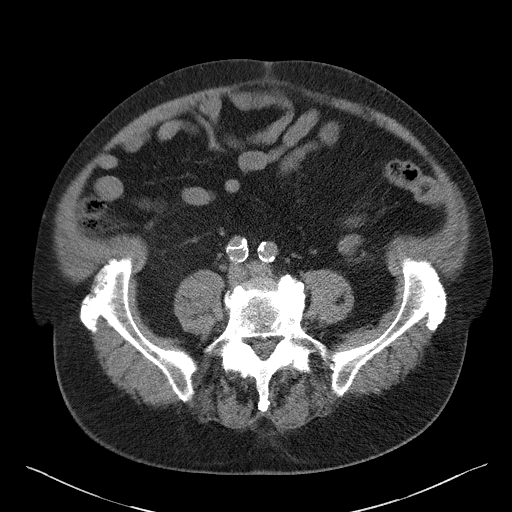
[im 46/89  soft-tissue]
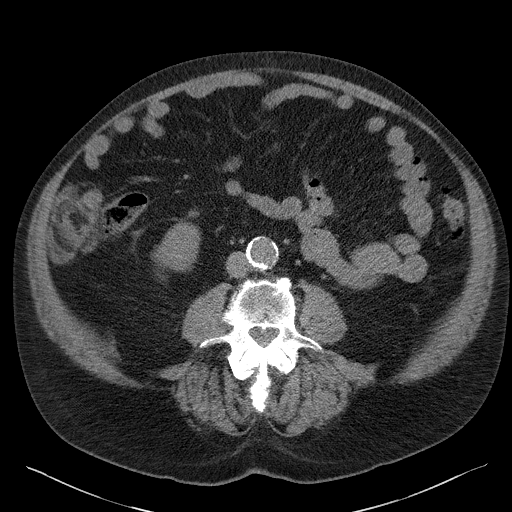
[im 50/89  soft-tissue]
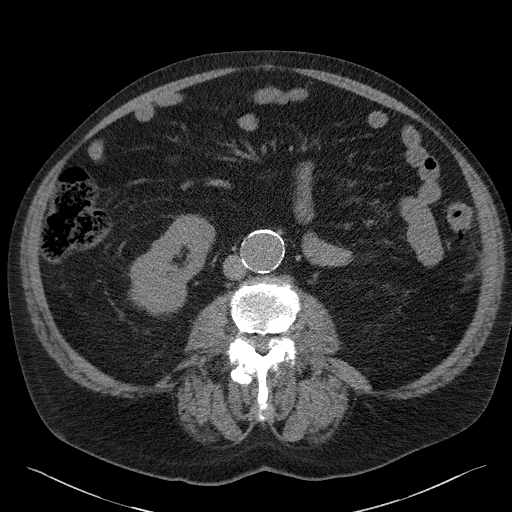
[im 57/89  soft-tissue]
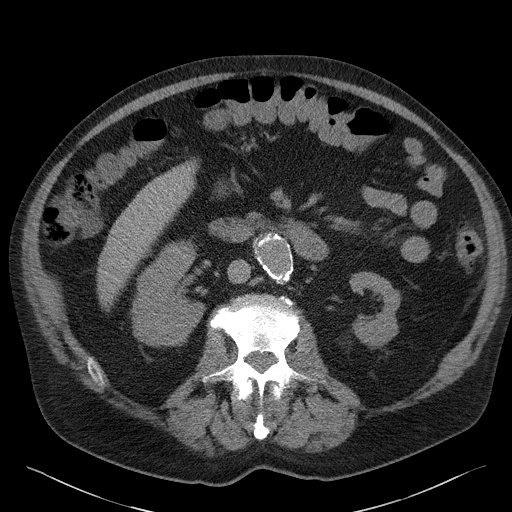
[im 57/89  bone]
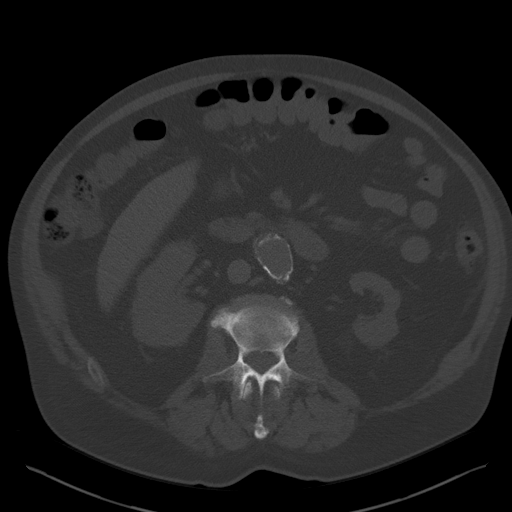
[im 64/89  soft-tissue]
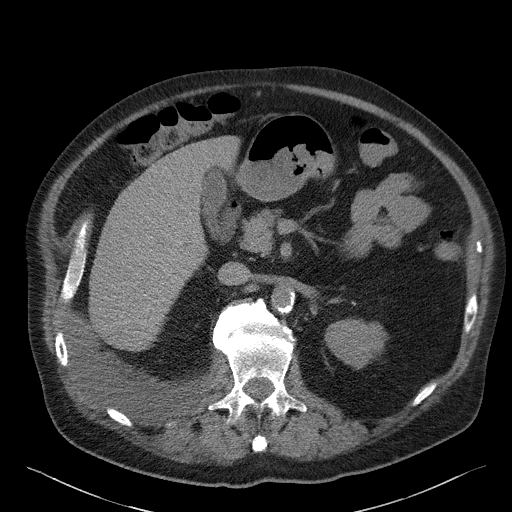
[im 71/89  soft-tissue]
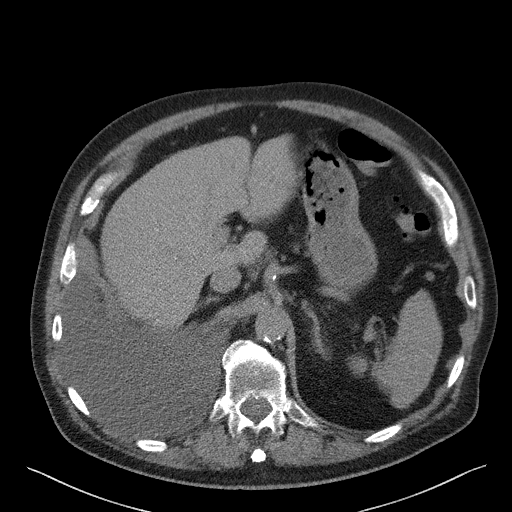
[im 78/89  soft-tissue]
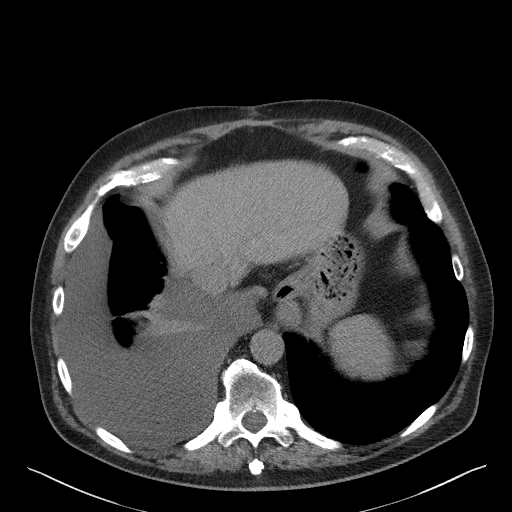
[im 85/89  soft-tissue]
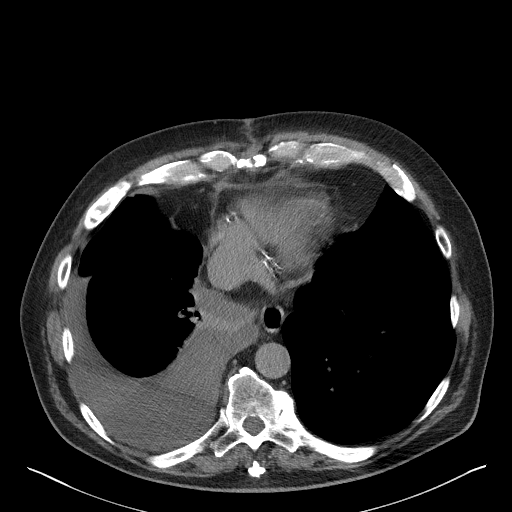

[Series 5: coronal st · coronal · 0.89mm/px · 3 of 117 slices shown]
[im 39/117  soft-tissue]
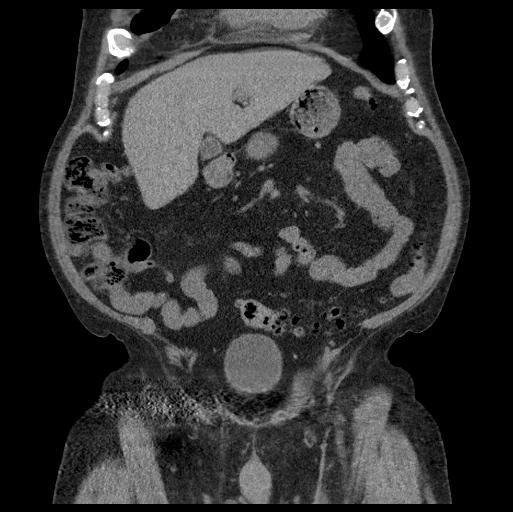
[im 52/117  soft-tissue]
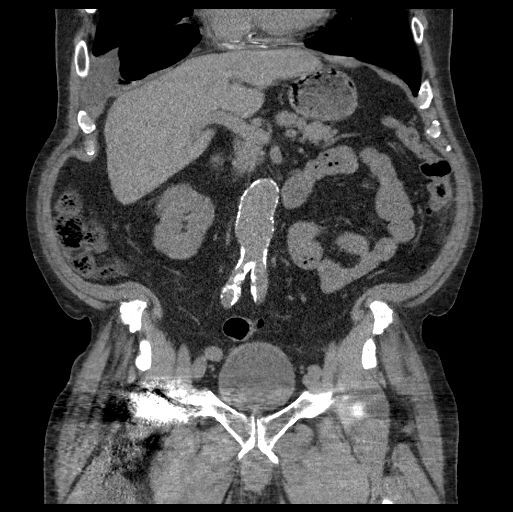
[im 65/117  soft-tissue]
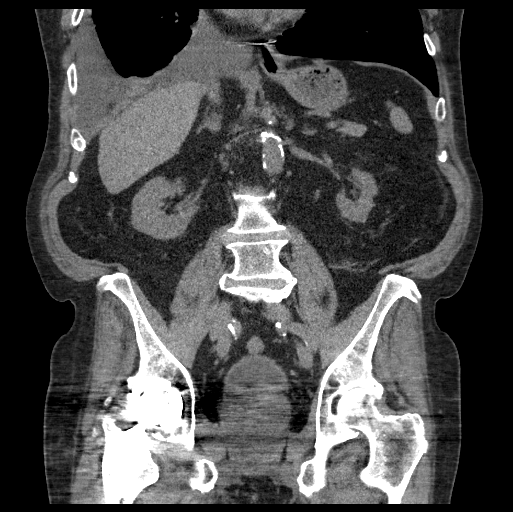

[16 of 46 positions shown; findings below may reference images not displayed]

FINDINGS: Lower chest: Multiple sternal wires are seen.

Mild-to-moderate severity atelectasis and/or infiltrate is seen
within the right lung base.

There is a large right pleural effusion.

Hepatobiliary: No focal liver abnormality is seen. No gallstones,
gallbladder wall thickening, or biliary dilatation.

Pancreas: Unremarkable. No pancreatic ductal dilatation or
surrounding inflammatory changes.

Spleen: Normal in size without focal abnormality.

Adrenals/Urinary Tract: Adrenal glands are unremarkable. The right
kidney is mildly larger in size than the left kidney. A 9 mm
exophytic cyst is seen along the anterolateral aspect of the mid
left kidney. Cortical scarring is seen along the lateral aspects of
the mid and upper left kidney. There is no evidence of
hydronephrosis or renal calculi. Bladder is unremarkable.

Stomach/Bowel: Stomach is within normal limits. Appendix appears
normal. No evidence of bowel dilatation. Numerous diverticula are
seen throughout the large bowel. Very mild inflammatory fat
stranding is seen adjacent to the lateral aspect of the mid
descending colon.

Vascular/Lymphatic: There is marked severity calcification of the
abdominal aorta and bilateral common iliac arteries. 3.5 cm x 3.2 cm
focal aneurysmal dilatation of the infrarenal abdominal aorta is
seen. No enlarged abdominal or pelvic lymph nodes.

Reproductive: There is moderate to marked severity prostate gland
enlargement.

Other: No abdominal wall hernia or abnormality. No abdominopelvic
ascites.

Musculoskeletal: A total right hip replacement is seen. Associated
streak artifact is noted with subsequently limited evaluation of the
adjacent osseous and soft tissue structures.

Multilevel degenerative changes seen throughout the lumbar spine.
IMPRESSION: 1. Mild likely consistent with very mild colitis and/or
diverticulitis involving the mid to distal descending colon.
2. 3.5 cm x 3.2 cm focal aneurysmal dilatation of the infrarenal
abdominal aorta.
3. Large right pleural effusion.
4. Mild-to-moderate severity right basilar atelectasis and/or
infiltrate.
5. Total right hip replacement.
6. Aortic atherosclerosis.

Aortic Atherosclerosis ([id number]).

## 2021-08-06 ENCOUNTER — Telehealth: Payer: Self-pay | Admitting: Cardiology

## 2021-08-06 DIAGNOSIS — I2510 Atherosclerotic heart disease of native coronary artery without angina pectoris: Secondary | ICD-10-CM

## 2021-08-06 DIAGNOSIS — Z951 Presence of aortocoronary bypass graft: Secondary | ICD-10-CM

## 2021-08-06 MED ORDER — METOPROLOL SUCCINATE ER 25 MG PO TB24: 25.0000 mg | ORAL_TABLET | Freq: Every day | ORAL | 1 refills | Status: DC

## 2021-08-06 NOTE — Telephone Encounter (Signed)
Pt's medication was sent to pt's pharmacy as requested. Confirmation received.  °

## 2021-08-06 NOTE — Telephone Encounter (Signed)
  *  STAT* If patient is at the pharmacy, call can be transferred to refill team.   1. Which medications need to be refilled? (please list name of each medication and dose if known) metoprolol succinate (TOPROL XL) 25 MG 24 hr tablet  2. Which pharmacy/location (including street and city if local pharmacy) is medication to be sent to? [place], [place]  3. Do they need a 30 day or 90 day supply? 90 days  Pt is running out of meds and needs refill asap

## 2021-08-07 DIAGNOSIS — D225 Melanocytic nevi of trunk: Secondary | ICD-10-CM | POA: Diagnosis not present

## 2021-08-07 DIAGNOSIS — D1721 Benign lipomatous neoplasm of skin and subcutaneous tissue of right arm: Secondary | ICD-10-CM | POA: Diagnosis not present

## 2021-08-07 DIAGNOSIS — D485 Neoplasm of uncertain behavior of skin: Secondary | ICD-10-CM | POA: Diagnosis not present

## 2021-08-07 DIAGNOSIS — D1722 Benign lipomatous neoplasm of skin and subcutaneous tissue of left arm: Secondary | ICD-10-CM | POA: Diagnosis not present

## 2021-08-07 DIAGNOSIS — L578 Other skin changes due to chronic exposure to nonionizing radiation: Secondary | ICD-10-CM | POA: Diagnosis not present

## 2021-08-07 DIAGNOSIS — Z85828 Personal history of other malignant neoplasm of skin: Secondary | ICD-10-CM | POA: Diagnosis not present

## 2021-08-07 DIAGNOSIS — D0421 Carcinoma in situ of skin of right ear and external auricular canal: Secondary | ICD-10-CM | POA: Diagnosis not present

## 2021-08-07 DIAGNOSIS — L570 Actinic keratosis: Secondary | ICD-10-CM | POA: Diagnosis not present

## 2021-08-07 DIAGNOSIS — L821 Other seborrheic keratosis: Secondary | ICD-10-CM | POA: Diagnosis not present

## 2021-08-07 MED ORDER — METOPROLOL SUCCINATE ER 25 MG PO TB24: 25.0000 mg | ORAL_TABLET | Freq: Every day | ORAL | 0 refills | Status: DC

## 2021-08-07 NOTE — Addendum Note (Signed)
Addended by: Carter Kitten D on: 08/07/2021 10:11 AM   Modules accepted: Orders

## 2021-08-07 NOTE — Telephone Encounter (Signed)
Pt medication was sent to local pharmacy for a 10 day supply, until mail order arrives. Confirmation received.

## 2021-08-07 NOTE — Telephone Encounter (Signed)
*  STAT* If patient is at the pharmacy, call can be transferred to refill team.   1. Which medications need to be refilled? (please list name of each medication and dose if known) metoprolol succinate (TOPROL XL) 25 MG 24 hr tablet  2. Which pharmacy/location (including street and city if local pharmacy) is medication to be sent to? [id number] [place], [place] [address]  3. Do they need a 30 day or 90 day supply? 10 day    Patient states he will not get him mail order refill in time. He states he [facility] until he gets his medication in the mail.

## 2021-08-12 ENCOUNTER — Ambulatory Visit: Payer: Medicare HMO | Admitting: Physician Assistant

## 2021-08-12 ENCOUNTER — Encounter: Payer: Self-pay | Admitting: Physician Assistant

## 2021-08-12 ENCOUNTER — Other Ambulatory Visit: Payer: Self-pay

## 2021-08-12 VITALS — BP 120/78 | HR 61 | Ht 70.0 in | Wt 214.8 lb

## 2021-08-12 DIAGNOSIS — I714 Abdominal aortic aneurysm, without rupture, unspecified: Secondary | ICD-10-CM

## 2021-08-12 DIAGNOSIS — I10 Essential (primary) hypertension: Secondary | ICD-10-CM

## 2021-08-12 DIAGNOSIS — I2510 Atherosclerotic heart disease of native coronary artery without angina pectoris: Secondary | ICD-10-CM | POA: Diagnosis not present

## 2021-08-12 DIAGNOSIS — E7800 Pure hypercholesterolemia, unspecified: Secondary | ICD-10-CM

## 2021-08-12 MED ORDER — LISINOPRIL 20 MG PO TABS: 20.0000 mg | ORAL_TABLET | Freq: Every day | ORAL | 3 refills | Status: DC

## 2021-08-12 MED ORDER — CLOPIDOGREL BISULFATE 75 MG PO TABS: 75.0000 mg | ORAL_TABLET | Freq: Every day | ORAL | 3 refills | Status: DC

## 2021-08-12 MED ORDER — METOPROLOL SUCCINATE ER 25 MG PO TB24: 25.0000 mg | ORAL_TABLET | Freq: Every day | ORAL | 3 refills | Status: DC

## 2021-08-12 MED ORDER — FUROSEMIDE 20 MG PO TABS: 20.0000 mg | ORAL_TABLET | Freq: Every day | ORAL | 1 refills | Status: DC | PRN

## 2021-08-12 MED ORDER — ATORVASTATIN CALCIUM 80 MG PO TABS: ORAL_TABLET | ORAL | 3 refills | Status: DC

## 2021-08-12 NOTE — Patient Instructions (Signed)
Medication Instructions:   Your physician recommends that you continue on your current medications as directed. Please refer to the Current Medication list given to you today.  *If you need a refill on your cardiac medications before your next appointment, please call your pharmacy*   Lab Work:  -None  If you have labs (blood work) drawn today and your tests are completely normal, you will receive your results only by: [place] (if you have MyChart) OR A paper copy in the mail If you have any lab test that is abnormal or we need to change your treatment, we will call you to review the results.   Testing/Procedures: Keep appointment in December for AAA duplex.    Follow-Up: At [facility], you and your health needs are our priority.  As part of our continuing mission to provide you with exceptional heart care, we have created designated Provider Care Teams.  These Care Teams include your primary Cardiologist (physician) and Advanced Practice Providers (APPs -  Physician Assistants and Nurse Practitioners) who all work together to provide you with the care you need, when you need it.  We recommend signing up for the patient portal called "MyChart".  Sign up information is provided on this After Visit Summary.  MyChart is used to connect with patients for Virtual Visits (Telemedicine).  Patients are able to view lab/test results, encounter notes, upcoming appointments, etc.  Non-urgent messages can be sent to your provider as well.   To learn more about what you can do with MyChart, go to [email or web address].    Your next appointment:   6 month(s)  The format for your next appointment:   In Person  Provider:   Gwyndolyn Kaufman, MD   Other Instructions

## 2021-08-12 NOTE — Progress Notes (Signed)
Cardiology Office Note:    Date:  08/12/2021   ID:  Dave Robles, DOB July 19, 1946, MRN [id number]  PCP:  Dorothyann Peng, NP   [facility] Providers Cardiologist:  Freada Bergeron, MD     Referring MD: Dorothyann Peng, NP   Chief Complaint:  Follow-up for CAD    Patient Profile:   Dave Robles is a 75 y.o. male with:  Coronary artery disease S/p CABG in 1991 NSTEMI s/p DES to [facility]) Myoview 4/19: low risk  S/p redo CABG in 05/2020 (free RIMA-OM, left radial-PDA) C/b R pleural effusion - managed with Furosemide  Abdominal aortic aneurysm  Hypertension  Hyperlipidemia  Depression  ED  Asthma  Prior CV studies: Echocardiogram 03/04/21 EF 60-65, no RWMA, mild LVH, GR 1 DD, normal RVSF, moderate LAE, mild AV sclerosis without stenosis  Pre-CABG Dopplers 05/23/2020 Bilateral ICA 1-39  LEFT HEART CATH AND CORS/GRAFTS ANGIOGRAPHY 05/13/2020 Narrative  Total occlusion of the mid LAD, total occlusion of the mid RCA, and high-grade calcified obstruction in the ostial to proximal circumflex with diffuse 50% left main stenosis.  Patent saphenous vein graft to the diagonal with 70 to 80% stenosis distal to the graft insertion site.  The diagonal fills retrogradely into the first septal perforator and the proximal LAD segment.  Diffuse 95% in-stent restenosis in the ostial to proximal saphenous vein graft to the distal RCA.  Normal LV function.  LVEDP is normal.  EF is estimated to be 60%. >>CABG  AAA [place] 10/22/2019 3.4 cm   Myoview 02/14/2018 EF 54, diaphragmatic attenuation, no ischemia, low risk study   Echocardiogram 04/07/2015 EF 55-60, normal wall motion, normal diastolic function, mild LAE   History of Present Illness: Dave Robles was previously followed by [name], NP.  He established with Dr. Johney Frame in 3/22.  A follow-up echocardiogram demonstrated normal LV function.  He returns for follow-up.  He is here alone.  He has been doing well  without chest pain, shortness of breath, syncope, orthopnea.  He does have mild leg edema related to vein harvesting.  This improves with elevation.        Past Medical History:  Diagnosis Date   AAA (abdominal aortic aneurysm) (HCC)    AAA (abdominal aortic aneurysm) without rupture ([place]) 01/19/2017   Adjustment disorder with depressed mood 03/04/2009   Arthritis    Colon polyps    CORONARY ARTERY DISEASE 07/10/2007   Coronary artery disease involving native coronary artery of native heart without angina pectoris 07/10/2007   Qualifier: Diagnosis of  By: Sherren Mocha, RN, Dorian Pod     Depression    DOE (dyspnea on exertion)    Episodic mood disorder ([place]) 08/01/2014   ERECTILE DYSFUNCTION 07/10/2007   Extrinsic asthma, unspecified 09/10/2008   HYPERLIPIDEMIA 07/10/2007   HYPERTENSION 07/10/2007   Myocardial infarction ([place]) 2016   when had angioplasty- was told had a silent MI   OA (osteoarthritis) of hip 04/19/2018   Onychomycosis 05/14/2014   Pain in joint of left knee 01/19/2017   Personal history of colonic polyps 07/08/2014   1997, 2001 8 mm transverse serrated polyp right colon 2010 07/08/2014 no polyps - repeat colon 2022      S/P CABG x 2 05/27/2020   TOBACCO USE 12/15/2009   Current Medications: Current Meds  Medication Sig   acetaminophen (TYLENOL) 325 MG tablet Take 650 mg by mouth every 6 (six) hours as needed for moderate pain or headache.    buPROPion (WELLBUTRIN XL) 300 MG 24  hr tablet Take 1 tablet (300 mg total) by mouth daily.   calcipotriene (DOVONOX) 0.005 % cream Apply 1 application topically 2 (two) times daily.   clobetasol cream (TEMOVATE) 8.93 % Apply 1 application topically daily.   Melatonin 10 MG TABS Take 20 mg by mouth at bedtime.   Multiple Vitamin (MULTIVITAMIN) capsule Take 1 capsule by mouth daily.   nitroGLYCERIN (NITROSTAT) 0.4 MG SL tablet Place 1 tablet (0.4 mg total) under the tongue every 5 (five) minutes as needed.   sildenafil (VIAGRA) 100 MG tablet Take 100 mg by  mouth daily as needed for erectile dysfunction.   traZODone (DESYREL) 100 MG tablet Take 1 tablet (100 mg total) by mouth at bedtime as needed for sleep.   [DISCONTINUED] atorvastatin (LIPITOR) 80 MG tablet TAKE 1 TABLET DAILY   [DISCONTINUED] clopidogrel (PLAVIX) 75 MG tablet Take 1 tablet (75 mg total) by mouth daily.   [DISCONTINUED] furosemide (LASIX) 20 MG tablet Take 1 tablet (20 mg total) by mouth daily as needed (leg swelling).   [DISCONTINUED] lisinopril (ZESTRIL) 20 MG tablet Take 1 tablet (20 mg total) by mouth daily.   [DISCONTINUED] metoprolol succinate (TOPROL XL) 25 MG 24 hr tablet Take 1 tablet (25 mg total) by mouth daily.    Allergies:   Patient has no known allergies.   Social History   Tobacco Use   Smoking status: Former    Packs/day: 0.50    Types: Cigarettes    Quit date: 1991    Years since quitting: 31.7   Smokeless tobacco: Never  Vaping Use   Vaping Use: Never used  Substance Use Topics   Alcohol use: Yes    Alcohol/week: 1.0 standard drink    Types: 1 Cans of beer per week    Comment: Gin 2 x week   Drug use: No    Family Hx: The patient's family history includes Cancer in his mother; Coronary artery disease in his father; Depression in his mother and paternal grandfather; Hyperlipidemia in an other family member; Hypertension in an other family member. There is no history of Colon cancer.  Review of Systems  Hematologic/Lymphatic: Bruises/bleeds easily.    EKGs/Labs/Other Test Reviewed:    EKG:  EKG is not ordered today.  The ekg ordered today demonstrates n/a  Recent Labs: 02/04/2021: ALT 30; BUN 18; Creatinine, Ser 1.22; Hemoglobin 15.9; Platelets 159.0; Potassium 3.8; Sodium 143; TSH 3.93   Recent Lipid Panel Lab Results  Component Value Date/Time   CHOL 101 02/04/2021 08:48 AM   CHOL 92 (L) 07/09/2020 03:27 PM   TRIG 100.0 02/04/2021 08:48 AM   HDL 33.10 (L) 02/04/2021 08:48 AM   HDL 30 (L) 07/09/2020 03:27 PM   LDLCALC 48 02/04/2021  08:48 AM   LDLCALC 44 07/09/2020 03:27 PM   LDLDIRECT 66.7 11/30/2011 01:26 PM     Risk Assessment/Calculations:          Physical Exam:    VS:  BP 120/78 (BP Location: Left Arm, Patient Position: Sitting, Cuff Size: Normal)   Pulse 61   Ht 5\' 10"  (1.778 m)   Wt 214 lb 12.8 oz (97.4 kg)   SpO2 96%   BMI 30.82 kg/m     Wt Readings from Last 3 Encounters:  08/12/21 214 lb 12.8 oz (97.4 kg)  02/05/21 216 lb (98 kg)  02/04/21 214 lb (97.1 kg)    Constitutional:      Appearance: Healthy appearance. Not in distress.  Neck:     Vascular: JVD normal.  Pulmonary:     Effort: Pulmonary effort is normal.     Breath sounds: No wheezing. No rales.  Cardiovascular:     Normal rate. Regular rhythm. Normal S1. Normal S2.      Murmurs: There is no murmur.  Edema:    Peripheral edema present.    Pretibial: bilateral trace edema of the pretibial area. Abdominal:     Palpations: Abdomen is soft.  Skin:    General: Skin is warm and dry.  Neurological:     General: No focal deficit present.     Mental Status: Alert and oriented to person, place and time.     Cranial Nerves: Cranial nerves are intact.       ASSESSMENT & PLAN:   1. Coronary artery disease involving native coronary artery of native heart without angina pectoris Status post CABG 1991 and redo bypass in 7/21.  He is doing well without anginal symptoms.  He remains on atorvastatin, clopidogrel, metoprolol succinate.  Follow-up in 6 months.  2. AAA (abdominal aortic aneurysm) without rupture 3.4 cm by ultrasound in 10/2019.  Blood pressure is well controlled.  Follow-up AAA ultrasound is scheduled for 10/2021.  3. Essential hypertension Blood pressure is well controlled on current therapy which includes lisinopril, metoprolol succinate.  4. Pure hypercholesterolemia Labs from [facility] personally reviewed.  02/04/2021: Total cholesterol 101, HDL 33.1, LDL 48, triglycerides 100, Hgb 15.9, creatinine 1.22, K+ 3.8, ALT 30, TSH  3.93.  Lipids are optimal.  He remains on high-dose statin therapy with atorvastatin.    Dispo:  Return in about 6 months (around 02/09/2022) for Routine 6 month follow up with Dr. Johney Frame..   Medication Adjustments/Labs and Tests Ordered: Current medicines are reviewed at length with the patient today.  Concerns regarding medicines are outlined above.  Tests Ordered: No orders of the defined types were placed in this encounter.  Medication Changes: Meds ordered this encounter  Medications   atorvastatin (LIPITOR) 80 MG tablet    Sig: TAKE 1 TABLET DAILY    Dispense:  90 tablet    Refill:  3   clopidogrel (PLAVIX) 75 MG tablet    Sig: Take 1 tablet (75 mg total) by mouth daily.    Dispense:  90 tablet    Refill:  3   furosemide (LASIX) 20 MG tablet    Sig: Take 1 tablet (20 mg total) by mouth daily as needed (leg swelling).    Dispense:  45 tablet    Refill:  1   lisinopril (ZESTRIL) 20 MG tablet    Sig: Take 1 tablet (20 mg total) by mouth daily.    Dispense:  90 tablet    Refill:  3   metoprolol succinate (TOPROL XL) 25 MG 24 hr tablet    Sig: Take 1 tablet (25 mg total) by mouth daily.    Dispense:  90 tablet    Refill:  3   Signed, Richardson Dopp, PA-C  08/12/2021 4:38 PM    [facility] [address], [place], [place]  [address] Phone: [phone]; Fax: [phone]

## 2021-08-14 ENCOUNTER — Other Ambulatory Visit: Payer: Self-pay | Admitting: Adult Health

## 2021-08-14 DIAGNOSIS — F5101 Primary insomnia: Secondary | ICD-10-CM

## 2021-08-21 DIAGNOSIS — K5790 Diverticulosis of intestine, part unspecified, without perforation or abscess without bleeding: Secondary | ICD-10-CM | POA: Diagnosis not present

## 2021-08-21 DIAGNOSIS — Z86010 Personal history of colonic polyps: Secondary | ICD-10-CM | POA: Diagnosis not present

## 2021-08-24 ENCOUNTER — Other Ambulatory Visit: Payer: Self-pay | Admitting: Adult Health

## 2021-08-24 DIAGNOSIS — F5101 Primary insomnia: Secondary | ICD-10-CM

## 2021-08-25 ENCOUNTER — Telehealth: Payer: Self-pay | Admitting: *Deleted

## 2021-08-25 NOTE — Telephone Encounter (Signed)
   [facility] Pre-operative Risk Assessment    Patient Name: Dave Robles  DOB: [birth year] MRN: [id number]  HEARTCARE STAFF:  - IMPORTANT!!!!!! Under Visit Info/Reason for Call, type in Other and utilize the format Clearance MM/DD/YY or Clearance TBD. Do not use dashes or single digits. - Please review there is not already an duplicate clearance open for this procedure. - If request is for dental extraction, please clarify the # of teeth to be extracted. - If the patient is currently at the dentist's office, call Pre-Op Callback Staff (MA/nurse) to input urgent request.  - If the patient is not currently in the dentist office, please route to the Pre-Op pool.  Request for surgical clearance:  What type of surgery is being performed? SCREENING COLONOSCOPY WITH POSSIBLE Bx or POLYPECTOMY   When is this surgery scheduled? TBD  What type of clearance is required (medical clearance vs. Pharmacy clearance to hold med vs. Both)? MEDICAL  Are there any medications that need to be held prior to surgery and how long? PLAVIX x 5-7 DAYS PRIOR  Practice name and name of physician performing surgery? [facility]; DR. Alford Highland  What is the office phone number? [phone]   7.   What is the office fax number? [phone]  8.   Anesthesia type (None, local, MAC, general) ? NOT LISTED; (PROPOFOL?)   Julaine Hua 08/25/2021, 9:45 AM  _________________________________________________________________   (provider comments below)

## 2021-08-26 NOTE — Telephone Encounter (Signed)
Ok to hold plavix 5 days prior to GI procedures

## 2021-08-26 NOTE — Telephone Encounter (Signed)
Unable to reach the patient

## 2021-08-26 NOTE — Telephone Encounter (Signed)
Left a message for the patient to call back and speak to the on-call preop APP of the day.  Patient was just seen 2 weeks ago by Richardson Dopp, PA-C at which time he was doing well.  He had a history of CAD status post initial CABG in 1991 and redo CABG in July 2021 with free RIMA to OM, and a left radial to PDA.  He also has concomitant hypertension, hyperlipidemia, and AAA.  Per office protocol, will need MD approval to have the patient come off of Plavix for 5 to 7 days prior to GI procedure, however Dr. Johney Frame will not be back to the office until mid November.  Will sent to [facility] Dr. Irish Lack to make sure he is okay with the patient coming off of Plavix temporarily for GI procedures especially since her redo bypass surgery was over a year ago.  Dr. Irish Lack, please forward your reply to P CV DIV PREOP

## 2021-08-27 NOTE — Telephone Encounter (Signed)
Pt is returning call to Pre Op Team. Pt states he might not be able to answer his phone due to his travels but he will callback if a VM is left.

## 2021-08-27 NOTE — Telephone Encounter (Signed)
   Primary Cardiologist: Freada Bergeron, MD  Chart reviewed as part of pre-operative protocol coverage. Given past medical history and time since last visit, based on ACC/AHA guidelines, Dave Robles would be at acceptable risk for the planned procedure without further cardiovascular testing.   Has Plavix may be held for 5 days prior to his procedure.  Please resume as soon as hemostasis is achieved.  I will route this recommendation to the requesting party via Epic fax function and remove from pre-op pool.  Please call with questions.  Jossie Ng. Cortez Flippen NP-C    08/27/2021, 12:32 PM [name] [address] Office [phone] Fax [phone]

## 2021-09-11 DIAGNOSIS — E669 Obesity, unspecified: Secondary | ICD-10-CM | POA: Diagnosis not present

## 2021-09-11 DIAGNOSIS — K5790 Diverticulosis of intestine, part unspecified, without perforation or abscess without bleeding: Secondary | ICD-10-CM | POA: Diagnosis not present

## 2021-09-11 DIAGNOSIS — K644 Residual hemorrhoidal skin tags: Secondary | ICD-10-CM | POA: Diagnosis not present

## 2021-09-11 DIAGNOSIS — K5730 Diverticulosis of large intestine without perforation or abscess without bleeding: Secondary | ICD-10-CM | POA: Diagnosis not present

## 2021-09-11 DIAGNOSIS — K635 Polyp of colon: Secondary | ICD-10-CM | POA: Diagnosis not present

## 2021-09-11 DIAGNOSIS — Z09 Encounter for follow-up examination after completed treatment for conditions other than malignant neoplasm: Secondary | ICD-10-CM | POA: Diagnosis not present

## 2021-09-11 DIAGNOSIS — E785 Hyperlipidemia, unspecified: Secondary | ICD-10-CM | POA: Diagnosis not present

## 2021-09-11 DIAGNOSIS — K5732 Diverticulitis of large intestine without perforation or abscess without bleeding: Secondary | ICD-10-CM | POA: Diagnosis not present

## 2021-09-11 DIAGNOSIS — I2510 Atherosclerotic heart disease of native coronary artery without angina pectoris: Secondary | ICD-10-CM | POA: Diagnosis not present

## 2021-09-11 DIAGNOSIS — Z8719 Personal history of other diseases of the digestive system: Secondary | ICD-10-CM | POA: Diagnosis not present

## 2021-09-11 DIAGNOSIS — Z87891 Personal history of nicotine dependence: Secondary | ICD-10-CM | POA: Diagnosis not present

## 2021-09-11 DIAGNOSIS — K649 Unspecified hemorrhoids: Secondary | ICD-10-CM | POA: Diagnosis not present

## 2021-09-11 DIAGNOSIS — Z86010 Personal history of colonic polyps: Secondary | ICD-10-CM | POA: Diagnosis not present

## 2021-09-11 DIAGNOSIS — I10 Essential (primary) hypertension: Secondary | ICD-10-CM | POA: Diagnosis not present

## 2021-09-14 DIAGNOSIS — K635 Polyp of colon: Secondary | ICD-10-CM | POA: Diagnosis not present

## 2021-09-14 DIAGNOSIS — Z86010 Personal history of colonic polyps: Secondary | ICD-10-CM | POA: Diagnosis not present

## 2021-10-06 ENCOUNTER — Other Ambulatory Visit (HOSPITAL_COMMUNITY): Payer: Self-pay | Admitting: Adult Health

## 2021-10-06 DIAGNOSIS — I7143 Infrarenal abdominal aortic aneurysm, without rupture: Secondary | ICD-10-CM

## 2021-10-07 ENCOUNTER — Encounter: Payer: Self-pay | Admitting: Adult Health

## 2021-10-08 ENCOUNTER — Other Ambulatory Visit: Payer: Self-pay | Admitting: Adult Health

## 2021-10-08 DIAGNOSIS — F5101 Primary insomnia: Secondary | ICD-10-CM

## 2021-10-13 ENCOUNTER — Telehealth: Payer: Self-pay

## 2021-10-13 NOTE — Telephone Encounter (Signed)
Patient called stating Rx was sent to the wrong pharmacy should be sent to below pharmacy traZODone (DESYREL) 100 MG tablet [employer] [facility] home delivery

## 2021-10-13 NOTE — Telephone Encounter (Signed)
This has been taking care of. Pt notified that I read the chart wrong. Pt also notified that Rx has been sent to [facility] for 90 day prescription. Pt verbalized understanding.

## 2021-10-14 ENCOUNTER — Other Ambulatory Visit: Payer: Self-pay

## 2021-10-14 DIAGNOSIS — F5101 Primary insomnia: Secondary | ICD-10-CM

## 2021-10-14 MED ORDER — TRAZODONE HCL 100 MG PO TABS
ORAL_TABLET | ORAL | 1 refills | Status: DC
Start: 2021-10-14 — End: 2022-06-14

## 2021-10-14 NOTE — Telephone Encounter (Signed)
Rx sent to [facility]

## 2021-10-19 DIAGNOSIS — M1712 Unilateral primary osteoarthritis, left knee: Secondary | ICD-10-CM | POA: Diagnosis not present

## 2021-10-19 DIAGNOSIS — M25562 Pain in left knee: Secondary | ICD-10-CM | POA: Diagnosis not present

## 2021-10-20 ENCOUNTER — Other Ambulatory Visit: Payer: Self-pay

## 2021-10-20 ENCOUNTER — Ambulatory Visit (HOSPITAL_COMMUNITY)
Admission: RE | Admit: 2021-10-20 | Discharge: 2021-10-20 | Disposition: A | Discharge status: Home / Self Care | Payer: Medicare HMO | Source: Ambulatory Visit | Attending: Internal Medicine | Admitting: Internal Medicine

## 2021-10-20 DIAGNOSIS — I7143 Infrarenal abdominal aortic aneurysm, without rupture: Secondary | ICD-10-CM | POA: Insufficient documentation

## 2021-10-22 DIAGNOSIS — L570 Actinic keratosis: Secondary | ICD-10-CM | POA: Diagnosis not present

## 2021-10-22 DIAGNOSIS — D0461 Carcinoma in situ of skin of right upper limb, including shoulder: Secondary | ICD-10-CM | POA: Diagnosis not present

## 2021-10-22 DIAGNOSIS — Z96641 Presence of right artificial hip joint: Secondary | ICD-10-CM | POA: Diagnosis not present

## 2021-11-12 DIAGNOSIS — Z951 Presence of aortocoronary bypass graft: Secondary | ICD-10-CM | POA: Diagnosis not present

## 2021-11-12 DIAGNOSIS — E782 Mixed hyperlipidemia: Secondary | ICD-10-CM | POA: Diagnosis not present

## 2021-11-12 DIAGNOSIS — I2510 Atherosclerotic heart disease of native coronary artery without angina pectoris: Secondary | ICD-10-CM | POA: Diagnosis not present

## 2021-11-12 DIAGNOSIS — I119 Hypertensive heart disease without heart failure: Secondary | ICD-10-CM | POA: Diagnosis not present

## 2021-11-12 DIAGNOSIS — I7143 Infrarenal abdominal aortic aneurysm, without rupture: Secondary | ICD-10-CM | POA: Diagnosis not present

## 2021-11-12 DIAGNOSIS — I252 Old myocardial infarction: Secondary | ICD-10-CM | POA: Diagnosis not present

## 2021-11-17 ENCOUNTER — Other Ambulatory Visit: Payer: Self-pay | Admitting: Adult Health

## 2021-11-17 DIAGNOSIS — F339 Major depressive disorder, recurrent, unspecified: Secondary | ICD-10-CM

## 2021-11-17 MED ORDER — BUPROPION HCL ER (XL) 300 MG PO TB24: 300.0000 mg | ORAL_TABLET | Freq: Every day | ORAL | 1 refills | Status: DC

## 2021-12-01 ENCOUNTER — Other Ambulatory Visit (HOSPITAL_COMMUNITY): Payer: Self-pay | Admitting: Adult Health

## 2021-12-01 DIAGNOSIS — I714 Abdominal aortic aneurysm, without rupture, unspecified: Secondary | ICD-10-CM

## 2021-12-22 ENCOUNTER — Encounter: Payer: Self-pay | Admitting: Adult Health

## 2021-12-22 DIAGNOSIS — F331 Major depressive disorder, recurrent, moderate: Secondary | ICD-10-CM | POA: Diagnosis not present

## 2021-12-29 ENCOUNTER — Telehealth: Payer: Self-pay | Admitting: Adult Health

## 2021-12-29 NOTE — Telephone Encounter (Signed)
Left message for patient to call back and schedule Medicare Annual Wellness Visit (AWV) either virtually or in office. Left  my Dave Robles number [phone]   Last AWV ;01/29/21 please schedule at anytime with [facility] Nurse Health Advisor 1 or 2   This should be a 45 minute visit.  Awv can schedule calendar year [place]

## 2022-01-04 ENCOUNTER — Ambulatory Visit (INDEPENDENT_AMBULATORY_CARE_PROVIDER_SITE_OTHER): Payer: Medicare HMO

## 2022-01-04 VITALS — Ht 70.0 in | Wt 208.0 lb

## 2022-01-04 DIAGNOSIS — Z0000 Encounter for general adult medical examination without abnormal findings: Secondary | ICD-10-CM | POA: Diagnosis not present

## 2022-01-04 NOTE — Patient Instructions (Signed)
[name] , Thank you for taking time to come for your Medicare Wellness Visit. I appreciate your ongoing commitment to your health goals. Please review the following plan we discussed and let me know if I can assist you in the future.   Screening recommendations/referrals: Colonoscopy: completed 09/11/2021 Recommended yearly ophthalmology/optometry visit for glaucoma screening and checkup Recommended yearly dental visit for hygiene and checkup  Vaccinations: Influenza vaccine: completed 09/02/2021 Pneumococcal vaccine: completed 09/20/2016 Tdap vaccine: n/a Shingles vaccine: completed   Covid-19:  03/04/2021, 08/10/2020, 01/11/2020, 12/24/2019  Advanced directives: Please bring a copy of your POA (Power of Attorney) and/or Living Will to your next appointment.   Conditions/risks identified: none  Next appointment: Follow up in one year for your annual wellness visit.   Preventive Care 75 Years and Older, Male Preventive care refers to lifestyle choices and visits with your health care provider that can promote health and wellness. What does preventive care include? A yearly physical exam. This is also called an annual well check. Dental exams once or twice a year. Routine eye exams. Ask your health care provider how often you should have your eyes checked. Personal lifestyle choices, including: Daily care of your teeth and gums. Regular physical activity. Eating a healthy diet. Avoiding tobacco and drug use. Limiting alcohol use. Practicing safe sex. Taking low doses of aspirin every day. Taking vitamin and mineral supplements as recommended by your health care provider. What happens during an annual well check? The services and screenings done by your health care provider during your annual well check will depend on your age, overall health, lifestyle risk factors, and family history of disease. Counseling  Your health care provider may ask you questions about your: Alcohol  use. Tobacco use. Drug use. Emotional well-being. Home and relationship well-being. Sexual activity. Eating habits. History of falls. Memory and ability to understand (cognition). Work and work Statistician. Screening  You may have the following tests or measurements: Height, weight, and BMI. Blood pressure. Lipid and cholesterol levels. These may be checked every 5 years, or more frequently if you are over 75 years old. Skin check. Lung cancer screening. You may have this screening every year starting at age 75 if you have a 30-pack-year history of smoking and currently smoke or have quit within the past 15 years. Fecal occult blood test (FOBT) of the stool. You may have this test every year starting at age 75. Flexible sigmoidoscopy or colonoscopy. You may have a sigmoidoscopy every 5 years or a colonoscopy every 10 years starting at age 75. Prostate cancer screening. Recommendations will vary depending on your family history and other risks. Hepatitis C blood test. Hepatitis B blood test. Sexually transmitted disease (STD) testing. Diabetes screening. This is done by checking your blood sugar (glucose) after you have not eaten for a while (fasting). You may have this done every 1-3 years. Abdominal aortic aneurysm (AAA) screening. You may need this if you are a current or former smoker. Osteoporosis. You may be screened starting at age 75 if you are at high risk. Talk with your health care provider about your test results, treatment options, and if necessary, the need for more tests. Vaccines  Your health care provider may recommend certain vaccines, such as: Influenza vaccine. This is recommended every year. Tetanus, diphtheria, and acellular pertussis (Tdap, Td) vaccine. You may need a Td booster every 10 years. Zoster vaccine. You may need this after age 75. Pneumococcal 13-valent conjugate (PCV13) vaccine. One dose is recommended after age  75. Pneumococcal polysaccharide  (PPSV23) vaccine. One dose is recommended after age 75. Talk to your health care provider about which screenings and vaccines you need and how often you need them. This information is not intended to replace advice given to you by your health care provider. Make sure you discuss any questions you have with your health care provider. Document Released: 11/28/2015 Document Revised: 07/21/2016 Document Reviewed: 09/02/2015 Elsevier Interactive Patient Education  2017 [place] Prevention in the Home Falls can cause injuries. They can happen to people of all ages. There are many things you can do to make your home safe and to help prevent falls. What can I do on the outside of my home? Regularly fix the edges of walkways and driveways and fix any cracks. Remove anything that might make you trip as you walk through a door, such as a raised step or threshold. Trim any bushes or trees on the path to your home. Use bright outdoor lighting. Clear any walking paths of anything that might make someone trip, such as rocks or tools. Regularly check to see if handrails are loose or broken. Make sure that both sides of any steps have handrails. Any raised decks and porches should have guardrails on the edges. Have any leaves, snow, or ice cleared regularly. Use sand or salt on walking paths during winter. Clean up any spills in your garage right away. This includes oil or grease spills. What can I do in the bathroom? Use night lights. Install grab bars by the toilet and in the tub and shower. Do not use towel bars as grab bars. Use non-skid mats or decals in the tub or shower. If you need to sit down in the shower, use a plastic, non-slip stool. Keep the floor dry. Clean up any water that spills on the floor as soon as it happens. Remove soap buildup in the tub or shower regularly. Attach bath mats securely with double-sided non-slip rug tape. Do not have throw rugs and other things on the  floor that can make you trip. What can I do in the bedroom? Use night lights. Make sure that you have a light by your bed that is easy to reach. Do not use any sheets or blankets that are too big for your bed. They should not hang down onto the floor. Have a firm chair that has side arms. You can use this for support while you get dressed. Do not have throw rugs and other things on the floor that can make you trip. What can I do in the kitchen? Clean up any spills right away. Avoid walking on wet floors. Keep items that you use a lot in easy-to-reach places. If you need to reach something above you, use a strong step stool that has a grab bar. Keep electrical cords out of the way. Do not use floor polish or wax that makes floors slippery. If you must use wax, use non-skid floor wax. Do not have throw rugs and other things on the floor that can make you trip. What can I do with my stairs? Do not leave any items on the stairs. Make sure that there are handrails on both sides of the stairs and use them. Fix handrails that are broken or loose. Make sure that handrails are as long as the stairways. Check any carpeting to make sure that it is firmly attached to the stairs. Fix any carpet that is loose or worn. Avoid having throw rugs at  the top or bottom of the stairs. If you do have throw rugs, attach them to the floor with carpet tape. Make sure that you have a light switch at the top of the stairs and the bottom of the stairs. If you do not have them, ask someone to add them for you. What else can I do to help prevent falls? Wear shoes that: Do not have high heels. Have rubber bottoms. Are comfortable and fit you well. Are closed at the toe. Do not wear sandals. If you use a stepladder: Make sure that it is fully opened. Do not climb a closed stepladder. Make sure that both sides of the stepladder are locked into place. Ask someone to hold it for you, if possible. Clearly mark and make  sure that you can see: Any grab bars or handrails. First and last steps. Where the edge of each step is. Use tools that help you move around (mobility aids) if they are needed. These include: Canes. Walkers. Scooters. Crutches. Turn on the lights when you go into a dark area. Replace any light bulbs as soon as they burn out. Set up your furniture so you have a clear path. Avoid moving your furniture around. If any of your floors are uneven, fix them. If there are any pets around you, be aware of where they are. Review your medicines with your doctor. Some medicines can make you feel dizzy. This can increase your chance of falling. Ask your doctor what other things that you can do to help prevent falls. This information is not intended to replace advice given to you by your health care provider. Make sure you discuss any questions you have with your health care provider. Document Released: 08/28/2009 Document Revised: 04/08/2016 Document Reviewed: 12/06/2014 Elsevier Interactive Patient Education  2017 [employer].

## 2022-01-04 NOTE — Progress Notes (Signed)
I connected with Dave Robles today by telephone and verified that I am speaking with the correct person using two identifiers. Location patient: home Location provider: work Persons participating in the virtual visit: Dave Robles, Glenna Durand LPN.   I discussed the limitations, risks, security and privacy concerns of performing an evaluation and management service by telephone and the availability of in person appointments. I also discussed with the patient that there may be a patient responsible charge related to this service. The patient expressed understanding and verbally consented to this telephonic visit.    Interactive audio and video telecommunications were attempted between this provider and patient, however failed, due to patient having technical difficulties OR patient did not have access to video capability.  We continued and completed visit with audio only.     Vital signs may be patient reported or missing.  Subjective:   Dave Robles is a 76 y.o. male who presents for Medicare Annual/Subsequent preventive examination.  Review of Systems     Cardiac Risk Factors include: advanced age (>13men, >31 women)     Objective:    Today's Vitals   01/04/22 1427  Weight: 208 lb (94.3 kg)  Height: 5\' 10"  (1.778 m)   Body mass index is 29.84 kg/m.  Advanced Directives 01/04/2022 01/29/2021 10/10/2020 05/30/2020 05/23/2020 05/13/2020 05/23/2018  Does Patient Have a Medical Advance Directive? Yes Yes No Yes Yes Yes Yes  Type of Paramedic of [place];Living will [place];Living will - Healthcare Power of [place];Living will [place];Living will [place];Living will  Does patient want to make changes to medical advance directive? - No - Patient declined - No - Patient declined - No - Patient declined No - Patient declined  Copy of [place] in  Chart? No - copy requested - - Yes - validated most recent copy scanned in chart (See row information) - Yes - validated most recent copy scanned in chart (See row information) No - copy requested    Current Medications (verified) Outpatient Encounter Medications as of 01/04/2022  Medication Sig   acetaminophen (TYLENOL) 325 MG tablet Take 650 mg by mouth every 6 (six) hours as needed for moderate pain or headache.    atorvastatin (LIPITOR) 80 MG tablet TAKE 1 TABLET DAILY   buPROPion (WELLBUTRIN XL) 300 MG 24 hr tablet Take 1 tablet (300 mg total) by mouth daily.   calcipotriene (DOVONOX) 0.005 % cream Apply 1 application topically 2 (two) times daily.   clobetasol cream (TEMOVATE) 9.62 % Apply 1 application topically daily.   clopidogrel (PLAVIX) 75 MG tablet Take 1 tablet (75 mg total) by mouth daily.   furosemide (LASIX) 20 MG tablet Take 1 tablet (20 mg total) by mouth daily as needed (leg swelling).   lisinopril (ZESTRIL) 20 MG tablet Take 1 tablet (20 mg total) by mouth daily.   Melatonin 10 MG TABS Take 20 mg by mouth at bedtime.   metoprolol succinate (TOPROL XL) 25 MG 24 hr tablet Take 1 tablet (25 mg total) by mouth daily.   Multiple Vitamin (MULTIVITAMIN) capsule Take 1 capsule by mouth daily.   nitroGLYCERIN (NITROSTAT) 0.4 MG SL tablet Place 1 tablet (0.4 mg total) under the tongue every 5 (five) minutes as needed.   sildenafil (VIAGRA) 100 MG tablet Take 100 mg by mouth daily as needed for erectile dysfunction.   traZODone (DESYREL) 100 MG tablet TAKE 1 TABLET BY MOUTH EVERY DAY  AT BEDTIME AS NEEDED FOR SLEEP   No facility-administered encounter medications on file as of 01/04/2022.    Allergies (verified) Patient has no known allergies.   History: Past Medical History:  Diagnosis Date   AAA (abdominal aortic aneurysm)    AAA (abdominal aortic aneurysm) without rupture 01/19/2017   Adjustment disorder with depressed mood 03/04/2009   Arthritis    Colon polyps    CORONARY  ARTERY DISEASE 07/10/2007   Coronary artery disease involving native coronary artery of native heart without angina pectoris 07/10/2007   Qualifier: Diagnosis of  By: Sherren Mocha, RN, Dorian Pod     Depression    DOE (dyspnea on exertion)    Episodic mood disorder ([place]) 08/01/2014   ERECTILE DYSFUNCTION 07/10/2007   Extrinsic asthma, unspecified 09/10/2008   HYPERLIPIDEMIA 07/10/2007   HYPERTENSION 07/10/2007   Myocardial infarction [facility]) 2016   when had angioplasty- was told had a silent MI   OA (osteoarthritis) of hip 04/19/2018   Onychomycosis 05/14/2014   Pain in joint of left knee 01/19/2017   Personal history of colonic polyps 07/08/2014   1997, 2001 8 mm transverse serrated polyp right colon 2010 07/08/2014 no polyps - repeat colon 2022      S/P CABG x 2 05/27/2020   TOBACCO USE 12/15/2009   Past Surgical History:  Procedure Laterality Date   COLONOSCOPY     CORONARY ANGIOPLASTY  12/2014   CORONARY ARTERY BYPASS GRAFT  11/15/89   5 vessels   CORONARY ARTERY BYPASS GRAFT N/A 05/27/2020   Procedure: REDO CORONARY ARTERY BYPASS GRAFTING (CABG), ON PUMP, TIMES TWO, USING RIGHT INTERNAL MAMMARY ARTERY AND LEFT RADIAL ARTERY (OPEN);  Surgeon: Grace Isaac, MD;  Location: [facility];  Service: Open Heart Surgery;  Laterality: N/A;  Left Radial Artery to PDA FREE RIMA to OM   HYDROCELE EXCISION / REPAIR  2011   INGUINAL HERNIA REPAIR Left 1985   LEFT HEART CATH AND CORS/GRAFTS ANGIOGRAPHY N/A 05/13/2020   Procedure: LEFT HEART CATH AND CORS/GRAFTS ANGIOGRAPHY;  Surgeon: Belva Crome, MD;  Location: [facility];  Service: Cardiovascular;  Laterality: N/A;   MEDIASTERNOTOMY N/A 05/27/2020   Procedure: MEDIAN STERNOTOMY;  Surgeon: Grace Isaac, MD;  Location: [facility];  Service: Open Heart Surgery;  Laterality: N/A;  Redo Median Sternotomy   RADIAL ARTERY HARVEST Left 05/27/2020   Procedure: RADIAL ARTERY HARVEST;  Surgeon: Grace Isaac, MD;  Location: [facility];  Service: Open Heart Surgery;   Laterality: Left;   TEE WITHOUT CARDIOVERSION N/A 05/27/2020   Procedure: TRANSESOPHAGEAL ECHOCARDIOGRAM (TEE);  Surgeon: Grace Isaac, MD;  Location: [facility];  Service: Open Heart Surgery;  Laterality: N/A;   TOTAL HIP ARTHROPLASTY Right 04/19/2018   Procedure: RIGHT TOTAL HIP ARTHROPLASTY ANTERIOR APPROACH;  Surgeon: Gaynelle Arabian, MD;  Location: [facility];  Service: Orthopedics;  Laterality: Right;   Family History  Problem Relation Age of Onset   Coronary artery disease Father        cabg   Cancer Mother        Breast and or lung    Depression Mother    Hyperlipidemia Other    Hypertension Other    Depression Paternal Grandfather    Colon cancer Neg Hx    Social History   Socioeconomic History   Marital status: Married    Spouse name: Not on file   Number of children: Not on file   Years of education: Not on file   Highest education level: Not  on file  Occupational History   Not on file  Tobacco Use   Smoking status: Former    Packs/day: 0.50    Types: Cigarettes    Quit date: 1991    Years since quitting: 32.1   Smokeless tobacco: Never  Vaping Use   Vaping Use: Never used  Substance and Sexual Activity   Alcohol use: Yes    Alcohol/week: 1.0 standard drink    Types: 1 Cans of beer per week    Comment: Gin 2 x week   Drug use: No   Sexual activity: Yes    Comment: regular exercise - yes  Other Topics Concern   Not on file  Social History Narrative   Retired from being an Financial controller of a small business    Married    No kids       He likes to play golf and rides bike       Social Determinants of Radio broadcast assistant Strain: Low Risk    Difficulty of Paying Living Expenses: Not hard at all  Food Insecurity: No Food Insecurity   Worried About Charity fundraiser in the Last Year: Never true   Arboriculturist in the Last Year: Never true  Transportation Needs: No Transportation Needs   Lack of Transportation (Medical): No   Lack of Transportation  (Non-Medical): No  Physical Activity: Sufficiently Active   Days of Exercise per Week: 4 days   Minutes of Exercise per Session: 100 min  Stress: No Stress Concern Present   Feeling of Stress : Not at all  Social Connections: Moderately Isolated   Frequency of Communication with Friends and Family: Three times a week   Frequency of Social Gatherings with Friends and Family: Three times a week   Attends Religious Services: Never   Active Member of Clubs or Organizations: No   Attends Music therapist: Never   Marital Status: Married    Tobacco Counseling Counseling given: Not Answered   Clinical Intake:  Pre-visit preparation completed: Yes  Pain : No/denies pain     Nutritional Status: BMI 25 -29 Overweight Nutritional Risks: None Diabetes: No  How often do you need to have someone help you when you read instructions, pamphlets, or other written materials from your doctor or pharmacy?: 1 - Never  Diabetic? no  Interpreter Needed?: No  Information entered by :: NAllen LPN   Activities of Daily Living In your present state of health, do you have any difficulty performing the following activities: 01/04/2022 12/31/2021  Hearing? N N  Vision? N N  Difficulty concentrating or making decisions? N N  Walking or climbing stairs? N N  Dressing or bathing? N N  Doing errands, shopping? N N  Preparing Food and eating ? N N  Using the Toilet? N N  In the past six months, have you accidently leaked urine? N N  Comment - -  Do you have problems with loss of bowel control? N N  Managing your Medications? N N  Managing your Finances? N N  Housekeeping or managing your Housekeeping? N N  Some recent data might be hidden    Patient Care Team: Dorothyann Peng, NP as PCP - General (Family Medicine) Freada Bergeron, MD as PCP - Cardiology (Cardiology)  Indicate any recent Medical Services you may have received from other than [facility] providers in the past year  (date may be approximate).     Assessment:   This is  a routine wellness examination for [facility].  Hearing/Vision screen Vision Screening - Comments:: Regular eye exams,  Dietary issues and exercise activities discussed: Current Exercise Habits: Home exercise routine, Frequency (Times/Week): 4   Goals Addressed             This Visit's Progress    Patient Stated       01/04/2022, lose weight       Depression Screen PHQ 2/9 Scores 01/04/2022 01/29/2021 09/20/2016 05/21/2016 01/27/2015 01/03/2014  PHQ - 2 Score 0 0 0 6 0 0  PHQ- 9 Score - - - 13 - -    Fall Risk Fall Risk  01/04/2022 12/31/2021 01/29/2021 09/20/2016 01/27/2015  Falls in the past year? 0 0 0 No No  Number falls in past yr: - 0 0 - -  Injury with Fall? - 0 0 - -  Risk for fall due to : Medication side effect - No Fall Risks - -  Follow up Falls evaluation completed;Education provided;Falls prevention discussed - Falls evaluation completed;Falls prevention discussed - -    FALL RISK PREVENTION PERTAINING TO THE HOME:  Any stairs in or around the home? Yes  If so, are there any without handrails? No  Home free of loose throw rugs in walkways, pet beds, electrical cords, etc? Yes  Adequate lighting in your home to reduce risk of falls? Yes   ASSISTIVE DEVICES UTILIZED TO PREVENT FALLS:  Life alert? No  Use of a cane, walker or w/c? No  Grab bars in the bathroom? Yes  Shower chair or bench in shower? Yes  Elevated toilet seat or a handicapped toilet? Yes   TIMED UP AND GO:  Was the test performed? No .      Cognitive Function:        Immunizations Immunization History  Administered Date(s) Administered   Fluad Quad(high Dose 65+) 07/31/2019   Influenza Split 08/15/2013   Influenza Whole 11/15/2005, 08/21/2007, 09/15/2009   Influenza, High Dose Seasonal PF 08/19/2015, 09/20/2016, 07/13/2017, 09/28/2018   Influenza,inj,Quad PF,6+ Mos 08/01/2014   Influenza-Unspecified 08/01/2020   PFIZER  Comirnaty(Gray Top)Covid-19 Tri-Sucrose Vaccine 03/04/2021   PFIZER(Purple Top)SARS-COV-2 Vaccination 12/24/2019, 01/11/2020, 08/10/2020   Pneumococcal Conjugate-13 01/03/2014   Pneumococcal Polysaccharide-23 11/16/2003, 12/15/2009, 09/20/2016   Td 11/15/1996, 08/21/2007   Zoster Recombinat (Shingrix) 02/01/2018, 05/28/2018   Zoster, Live 05/07/2011    TDAP status: n/a  Flu Vaccine status: Up to date  Pneumococcal vaccine status: Up to date  Covid-19 vaccine status: Completed vaccines  Qualifies for Shingles Vaccine? Yes   Zostavax completed Yes   Shingrix Completed?: Yes  Screening Tests Health Maintenance  Topic Date Due   COVID-19 Vaccine (5 - Booster for Pfizer series) 04/29/2021   INFLUENZA VACCINE  06/15/2021   COLONOSCOPY (Pts 45-69yrs Insurance coverage will need to be confirmed)  09/11/2028   Pneumonia Vaccine 60+ Years old  Completed   Hepatitis C Screening  Completed   Zoster Vaccines- Shingrix  Completed   HPV VACCINES  Aged Out    Health Maintenance  Health Maintenance Due  Topic Date Due   COVID-19 Vaccine (5 - Booster for [place] series) 04/29/2021   INFLUENZA VACCINE  06/15/2021    Colorectal cancer screening: Type of screening: Colonoscopy. Completed 2023. Repeat every 7 years  Lung Cancer Screening: (Low Dose CT Chest recommended if Age 79-80 years, 30 pack-year currently smoking OR have quit w/in 15years.) does not qualify.   Lung Cancer Screening Referral: no  Additional Screening:  Hepatitis C Screening: does qualify; Completed  02/04/2021  Vision Screening: Recommended annual ophthalmology exams for early detection of glaucoma and other disorders of the eye. Is the patient up to date with their annual eye exam?  Yes  Who is the provider or what is the name of the office in which the patient attends annual eye exams?  If pt is not established with a provider, would they like to be referred to a provider to establish care? No .   Dental  Screening: Recommended annual dental exams for proper oral hygiene  Community Resource Referral / Chronic Care Management: CRR required this visit?  No   CCM required this visit?  No      Plan:     I have personally reviewed and noted the following in the patients chart:   Medical and social history Use of alcohol, tobacco or illicit drugs  Current medications and supplements including opioid prescriptions. Patient is not currently taking opioid prescriptions. Functional ability and status Nutritional status Physical activity Advanced directives List of other physicians Hospitalizations, surgeries, and ER visits in previous 12 months Vitals Screenings to include cognitive, depression, and falls Referrals and appointments  In addition, I have reviewed and discussed with patient certain preventive protocols, quality metrics, and best practice recommendations. A written personalized care plan for preventive services as well as general preventive health recommendations were provided to patient.     Kellie Simmering, LPN   3/47/[id number]   Nurse Notes: 6 CIT not administered. Patient has normal cognition per conversation.

## 2022-01-19 DIAGNOSIS — L814 Other melanin hyperpigmentation: Secondary | ICD-10-CM | POA: Diagnosis not present

## 2022-01-19 DIAGNOSIS — D2239 Melanocytic nevi of other parts of face: Secondary | ICD-10-CM | POA: Diagnosis not present

## 2022-01-19 DIAGNOSIS — L570 Actinic keratosis: Secondary | ICD-10-CM | POA: Diagnosis not present

## 2022-01-19 DIAGNOSIS — D225 Melanocytic nevi of trunk: Secondary | ICD-10-CM | POA: Diagnosis not present

## 2022-01-19 DIAGNOSIS — L821 Other seborrheic keratosis: Secondary | ICD-10-CM | POA: Diagnosis not present

## 2022-01-31 NOTE — Progress Notes (Deleted)
?Cardiology Office Note:   ? ?Date:  01/31/2022  ? ?ID:  Dave Robles, DOB 10-20-1946, MRN [id number] ? ?PCP:  Dorothyann Peng, NP ?  ?[place]  ?Cardiologist:  Freada Bergeron, MD  ?Advanced Practice Provider:  No care team member to display ?Electrophysiologist:  None  ? ? ?Referring MD: Dorothyann Peng, NP  ? ? ?History of Present Illness:   ? ?Dave Robles is a 76 y.o. male with a hx of CAD with original CABG x 3 with LIMA to LAD, SVG to DX and SVG to distal RCA in 1991 by Dr. Redmond Pulling, HTN, HLD, ED, past tobacco abuse, asthma, AAA (last measured in 10/2019 - 3.4 cm) and depression who presents to clinic for follow-up. Was previously followed by Dave Robles. ?  ?Per review of the record, the patient had a non-ST elevation myocardial infarction in 2016 while vacationing in [place].  Cath noted to have normal left main, 90% prox LAD, 99% mid LAD with patent LIMA to mid LAD and patent SVG to DX. LCX origin stenosis vs flow void? Fistula vs competitive flow in branch?, 100% RCA, SVG to RCA 99% origin treated with Xience DES and PTCA to the distal. ?  ?Patient has been closely followed by Dave Robles. Was seen in 04/2020 with worsening dyspnea on exertion which is his anginal equivalent. Was referred for cardiac cath at that time which led to need for repeat CABG on 05/27/20. Specifically, he received a left radial artery to PDA, and a free right internal mammary artery to obtuse marginal.  Post-op course complicated by need for inotropic support and blood loss anemia requiring blood transfusion. ? ?Repeat TTE 03/04/21 with EF 60-65, no RWMA, mild LVH, GR 1 DD, normal RVSF, moderate LAE, mild AV sclerosis without stenosis. ?  ?Was last seen by Dave Robles on 08/12/21 where was doing well. No anginal symptoms. ? ?Today, *** ?  ? ?Past Medical History:  ?Diagnosis Date  ? AAA (abdominal aortic aneurysm)   ? AAA (abdominal aortic aneurysm) without rupture 01/19/2017  ? Adjustment  disorder with depressed mood 03/04/2009  ? Arthritis   ? Colon polyps   ? CORONARY ARTERY DISEASE 07/10/2007  ? Coronary artery disease involving native coronary artery of native heart without angina pectoris 07/10/2007  ? Qualifier: Diagnosis of  By: Scherrie Gerlach    ? Depression   ? DOE (dyspnea on exertion)   ? Episodic mood disorder ([place]) 08/01/2014  ? ERECTILE DYSFUNCTION 07/10/2007  ? Extrinsic asthma, unspecified 09/10/2008  ? HYPERLIPIDEMIA 07/10/2007  ? HYPERTENSION 07/10/2007  ? Myocardial infarction [facility]) 2016  ? when had angioplasty- was told had a silent MI  ? OA (osteoarthritis) of hip 04/19/2018  ? Onychomycosis 05/14/2014  ? Pain in joint of left knee 01/19/2017  ? Personal history of colonic polyps 07/08/2014  ? 1997, 2001 8 mm transverse serrated polyp right colon 2010 07/08/2014 no polyps - repeat colon 2022     ? S/P CABG x 2 05/27/2020  ? TOBACCO USE 12/15/2009  ? ? ?Past Surgical History:  ?Procedure Laterality Date  ? COLONOSCOPY    ? CORONARY ANGIOPLASTY  12/2014  ? CORONARY ARTERY BYPASS GRAFT  11/15/89  ? 5 vessels  ? CORONARY ARTERY BYPASS GRAFT N/A 05/27/2020  ? Procedure: REDO CORONARY ARTERY BYPASS GRAFTING (CABG), ON PUMP, TIMES TWO, USING RIGHT INTERNAL MAMMARY ARTERY AND LEFT RADIAL ARTERY (OPEN);  Surgeon: Grace Isaac, MD;  Location: [place];  Service: Open Heart  Surgery;  Laterality: N/A;  Left Radial Artery to PDA ?FREE RIMA to OM  ? HYDROCELE EXCISION / REPAIR  2011  ? INGUINAL HERNIA REPAIR Left 1985  ? LEFT HEART CATH AND CORS/GRAFTS ANGIOGRAPHY N/A 05/13/2020  ? Procedure: LEFT HEART CATH AND CORS/GRAFTS ANGIOGRAPHY;  Surgeon: Belva Crome, MD;  Location: [place] CV [facility];  Service: Cardiovascular;  Laterality: N/A;  ? MEDIASTERNOTOMY N/A 05/27/2020  ? Procedure: MEDIAN STERNOTOMY;  Surgeon: Grace Isaac, MD;  Location: [place];  Service: Open Heart Surgery;  Laterality: N/A;  Redo Median Sternotomy  ? RADIAL ARTERY HARVEST Left 05/27/2020  ? Procedure: RADIAL ARTERY HARVEST;   Surgeon: Grace Isaac, MD;  Location: [place];  Service: Open Heart Surgery;  Laterality: Left;  ? TEE WITHOUT CARDIOVERSION N/A 05/27/2020  ? Procedure: TRANSESOPHAGEAL ECHOCARDIOGRAM (TEE);  Surgeon: Grace Isaac, MD;  Location: [place];  Service: Open Heart Surgery;  Laterality: N/A;  ? TOTAL HIP ARTHROPLASTY Right 04/19/2018  ? Procedure: RIGHT TOTAL HIP ARTHROPLASTY ANTERIOR APPROACH;  Surgeon: Gaynelle Arabian, MD;  Location: [facility];  Service: Orthopedics;  Laterality: Right;  ? ? ?Current Medications: ?No outpatient medications have been marked as taking for the 02/03/22 encounter (Appointment) with Freada Bergeron, MD.  ?  ? ?Allergies:   Patient has no known allergies.  ? ?Social History  ? ?Socioeconomic History  ? Marital status: Married  ?  Spouse name: Not on file  ? Number of children: Not on file  ? Years of education: Not on file  ? Highest education level: Not on file  ?Occupational History  ? Not on file  ?Tobacco Use  ? Smoking status: Former  ?  Packs/day: 0.50  ?  Types: Cigarettes  ?  Quit date: 10  ?  Years since quitting: 32.2  ? Smokeless tobacco: Never  ?Vaping Use  ? Vaping Use: Never used  ?Substance and Sexual Activity  ? Alcohol use: Yes  ?  Alcohol/week: 1.0 standard drink  ?  Types: 1 Cans of beer per week  ?  Comment: Gin 2 x week  ? Drug use: No  ? Sexual activity: Yes  ?  Comment: regular exercise - yes  ?Other Topics Concern  ? Not on file  ?Social History Narrative  ? Retired from being an Financial controller of a small business   ? Married   ? No kids   ?   ? He likes to play golf and rides bike   ?   ? ?Social Determinants of Health  ? ?Financial Resource Strain: Low Risk   ? Difficulty of Paying Living Expenses: Not hard at all  ?Food Insecurity: No Food Insecurity  ? Worried About Charity fundraiser in the Last Year: Never true  ? Ran Out of Food in the Last Year: Never true  ?Transportation Needs: No Transportation Needs  ? Lack of Transportation (Medical): No  ? Lack of  Transportation (Non-Medical): No  ?Physical Activity: Sufficiently Active  ? Days of Exercise per Week: 4 days  ? Minutes of Exercise per Session: 100 min  ?Stress: No Stress Concern Present  ? Feeling of Stress : Not at all  ?Social Connections: Moderately Isolated  ? Frequency of Communication with Friends and Family: Three times a week  ? Frequency of Social Gatherings with Friends and Family: Three times a week  ? Attends Religious Services: Never  ? Active Member of Clubs or Organizations: No  ? Attends Archivist Meetings: Never  ?  Marital Status: Married  ?  ? ?Family History: ?The patient's family history includes Cancer in his mother; Coronary artery disease in his father; Depression in his mother and paternal grandfather; Hyperlipidemia in an other family member; Hypertension in an other family member. There is no history of Colon cancer. ? ?ROS:   ?Please see the history of present illness.    ?Review of Systems  ?Constitutional:  Negative for chills and fever.  ?HENT:  Negative for hearing loss and sore throat.   ?Eyes:  Negative for blurred vision and redness.  ?Respiratory:  Negative for shortness of breath.   ?Cardiovascular:  Negative for chest pain, palpitations, orthopnea, leg swelling and PND.  ?Gastrointestinal:  Negative for melena, nausea and vomiting.  ?Genitourinary:  Negative for dysuria and flank pain.  ?Musculoskeletal:  Negative for falls and myalgias.  ?Neurological:  Negative for dizziness and loss of consciousness.  ?Endo/Heme/Allergies:  Bruises/bleeds easily.  ?Psychiatric/Behavioral:  Negative for substance abuse.   ? ?EKGs/Labs/Other Studies Reviewed:   ? ?The following studies were reviewed today: ?TTE 02/2021: ?IMPRESSIONS  ? 1. Left ventricular ejection fraction, by estimation, is 60 to 65%. The  ?left ventricle has normal function. The left ventricle has no regional  ?wall motion abnormalities. There is mild concentric left ventricular  ?hypertrophy. Left ventricular  diastolic  ?parameters are consistent with Grade I diastolic dysfunction (impaired  ?relaxation).  ? 2. Right ventricular systolic function is normal. The right ventricular  ?size is normal.  ? 3. Left atrial size was

## 2022-02-02 NOTE — Progress Notes (Signed)
?Cardiology Office Note:   ? ?Date:  02/03/2022  ? ?ID:  Dave Robles, DOB Sep 28, 1946, MRN [id number] ? ?PCP:  Dorothyann Peng, NP ?  ?[name]  ?Cardiologist:  Freada Bergeron, MD  ?Advanced Practice Provider:  No care team member to display ?Electrophysiologist:  None  ? ? ?Referring MD: Dorothyann Peng, NP  ? ? ?History of Present Illness:   ? ?Dave Robles is a 76 y.o. male with a hx of CAD with original CABG x 3 with LIMA to LAD, SVG to DX and SVG to distal RCA in 1991 by Dr. Redmond Pulling, HTN, HLD, ED, past tobacco abuse, asthma, AAA (last measured in 10/2019 - 3.4 cm) and depression who presents to clinic for follow-up. Was previously followed by [name]. ?  ?Per review of the record, the patient had a non-ST elevation myocardial infarction in 2016 while vacationing in [place].  Cath noted to have normal left main, 90% prox LAD, 99% mid LAD with patent LIMA to mid LAD and patent SVG to DX. LCX origin stenosis vs flow void? Fistula vs competitive flow in branch?, 100% RCA, SVG to RCA 99% origin treated with Xience DES and PTCA to the distal. ?  ?Patient has been closely followed by [name]. Was seen in 04/2020 with worsening dyspnea on exertion which is his anginal equivalent. Was referred for cardiac cath at that time which led to need for repeat CABG on 05/27/20. Specifically, he received a left radial artery to PDA, and a free right internal mammary artery to obtuse marginal.  Post-op course complicated by need for inotropic support and blood loss anemia requiring blood transfusion. ? ?Repeat TTE 03/04/21 with EF 60-65, no RWMA, mild LVH, GR 1 DD, normal RVSF, moderate LAE, mild AV sclerosis without stenosis. ?  ?Was last seen by Richardson Dopp on 08/12/21 where was doing well. No anginal symptoms. ? ?Today, he is doing well. He stays active by riding his bike, hiking, and playing pickleball. His bilateral knee pain bothers him but does not limit him. No exertional  chest pain, SOB, lightheadedness, dizziness or syncope. BP is well controlled and at goal. Compliant with all meds.  ? ?He endorses occasional bilateral leg swelling which is improved with HCTZ. He also takes Lasix every other day.  He bruises easily on Plavix. He denies chest pain, chest pressure, dyspnea at rest or with exertion, palpitations, PND, or orthopnea.  ? ?Past Medical History:  ?Diagnosis Date  ? AAA (abdominal aortic aneurysm)   ? AAA (abdominal aortic aneurysm) without rupture 01/19/2017  ? Adjustment disorder with depressed mood 03/04/2009  ? Arthritis   ? Colon polyps   ? CORONARY ARTERY DISEASE 07/10/2007  ? Coronary artery disease involving native coronary artery of native heart without angina pectoris 07/10/2007  ? Qualifier: Diagnosis of  By: Scherrie Gerlach    ? Depression   ? DOE (dyspnea on exertion)   ? Episodic mood disorder ([place]) 08/01/2014  ? ERECTILE DYSFUNCTION 07/10/2007  ? Extrinsic asthma, unspecified 09/10/2008  ? HYPERLIPIDEMIA 07/10/2007  ? HYPERTENSION 07/10/2007  ? Myocardial infarction [facility]) 2016  ? when had angioplasty- was told had a silent MI  ? OA (osteoarthritis) of hip 04/19/2018  ? Onychomycosis 05/14/2014  ? Pain in joint of left knee 01/19/2017  ? Personal history of colonic polyps 07/08/2014  ? 1997, 2001 8 mm transverse serrated polyp right colon 2010 07/08/2014 no polyps - repeat colon 2022     ? S/P CABG  x 2 05/27/2020  ? TOBACCO USE 12/15/2009  ? ? ?Past Surgical History:  ?Procedure Laterality Date  ? COLONOSCOPY    ? CORONARY ANGIOPLASTY  12/2014  ? CORONARY ARTERY BYPASS GRAFT  11/15/89  ? 5 vessels  ? CORONARY ARTERY BYPASS GRAFT N/A 05/27/2020  ? Procedure: REDO CORONARY ARTERY BYPASS GRAFTING (CABG), ON PUMP, TIMES TWO, USING RIGHT INTERNAL MAMMARY ARTERY AND LEFT RADIAL ARTERY (OPEN);  Surgeon: Grace Isaac, MD;  Location: [place];  Service: Open Heart Surgery;  Laterality: N/A;  Left Radial Artery to PDA ?FREE RIMA to OM  ? HYDROCELE EXCISION / REPAIR  2011  ? INGUINAL  HERNIA REPAIR Left 1985  ? LEFT HEART CATH AND CORS/GRAFTS ANGIOGRAPHY N/A 05/13/2020  ? Procedure: LEFT HEART CATH AND CORS/GRAFTS ANGIOGRAPHY;  Surgeon: Belva Crome, MD;  Location: [place] CV [facility];  Service: Cardiovascular;  Laterality: N/A;  ? MEDIASTERNOTOMY N/A 05/27/2020  ? Procedure: MEDIAN STERNOTOMY;  Surgeon: Grace Isaac, MD;  Location: [place];  Service: Open Heart Surgery;  Laterality: N/A;  Redo Median Sternotomy  ? RADIAL ARTERY HARVEST Left 05/27/2020  ? Procedure: RADIAL ARTERY HARVEST;  Surgeon: Grace Isaac, MD;  Location: [place];  Service: Open Heart Surgery;  Laterality: Left;  ? TEE WITHOUT CARDIOVERSION N/A 05/27/2020  ? Procedure: TRANSESOPHAGEAL ECHOCARDIOGRAM (TEE);  Surgeon: Grace Isaac, MD;  Location: [place];  Service: Open Heart Surgery;  Laterality: N/A;  ? TOTAL HIP ARTHROPLASTY Right 04/19/2018  ? Procedure: RIGHT TOTAL HIP ARTHROPLASTY ANTERIOR APPROACH;  Surgeon: Gaynelle Arabian, MD;  [facility];  Service: Orthopedics;  Laterality: Right;  ? ? ?Current Medications: ?Current Meds  ?Medication Sig  ? acetaminophen (TYLENOL) 325 MG tablet Take 650 mg by mouth every 6 (six) hours as needed for moderate pain or headache.   ? atorvastatin (LIPITOR) 80 MG tablet TAKE 1 TABLET DAILY  ? buPROPion (WELLBUTRIN XL) 300 MG 24 hr tablet Take 1 tablet (300 mg total) by mouth daily.  ? calcipotriene (DOVONOX) 0.005 % cream Apply 1 application topically 2 (two) times daily.  ? clobetasol cream (TEMOVATE) 6.22 % Apply 1 application topically daily.  ? clopidogrel (PLAVIX) 75 MG tablet Take 1 tablet (75 mg total) by mouth daily.  ? furosemide (LASIX) 20 MG tablet Take 1 tablet (20 mg total) by mouth daily as needed (leg swelling).  ? hydrochlorothiazide (HYDRODIURIL) 25 MG tablet Take 12.5 mg by mouth daily.  ? Melatonin 10 MG TABS Take 20 mg by mouth at bedtime.  ? metoprolol succinate (TOPROL XL) 25 MG 24 hr tablet Take 1 tablet (25 mg total) by mouth daily.  ? Multiple Vitamin  (MULTIVITAMIN) capsule Take 1 capsule by mouth daily.  ? nitroGLYCERIN (NITROSTAT) 0.4 MG SL tablet Place 1 tablet (0.4 mg total) under the tongue every 5 (five) minutes as needed.  ? sildenafil (VIAGRA) 100 MG tablet Take 100 mg by mouth daily as needed for erectile dysfunction.  ? traZODone (DESYREL) 100 MG tablet TAKE 1 TABLET BY MOUTH EVERY DAY AT BEDTIME AS NEEDED FOR SLEEP  ?  ? ?Allergies:   Patient has no known allergies.  ? ?Social History  ? ?Socioeconomic History  ? Marital status: Married  ?  Spouse name: Not on file  ? Number of children: Not on file  ? Years of education: Not on file  ? Highest education level: Not on file  ?Occupational History  ? Not on file  ?Tobacco Use  ? Smoking status: Former  ?  Packs/day: 0.50  ?  Types: Cigarettes  ?  Quit date: 37  ?  Years since quitting: 32.2  ? Smokeless tobacco: Never  ?Vaping Use  ? Vaping Use: Never used  ?Substance and Sexual Activity  ? Alcohol use: Yes  ?  Alcohol/week: 1.0 standard drink  ?  Types: 1 Cans of beer per week  ?  Comment: Gin 2 x week  ? Drug use: No  ? Sexual activity: Yes  ?  Comment: regular exercise - yes  ?Other Topics Concern  ? Not on file  ?Social History Narrative  ? Retired from being an Financial controller of a small business   ? Married   ? No kids   ?   ? He likes to play golf and rides bike   ?   ? ?Social Determinants of Health  ? ?Financial Resource Strain: Low Risk   ? Difficulty of Paying Living Expenses: Not hard at all  ?Food Insecurity: No Food Insecurity  ? Worried About Charity fundraiser in the Last Year: Never true  ? Ran Out of Food in the Last Year: Never true  ?Transportation Needs: No Transportation Needs  ? Lack of Transportation (Medical): No  ? Lack of Transportation (Non-Medical): No  ?Physical Activity: Sufficiently Active  ? Days of Exercise per Week: 4 days  ? Minutes of Exercise per Session: 100 min  ?Stress: No Stress Concern Present  ? Feeling of Stress : Not at all  ?Social Connections: Not on file  ?   ? ?Family History: ?The patient's family history includes Cancer in his mother; Coronary artery disease in his father; Depression in his mother and paternal grandfather; Hyperlipidemia in an other family member;

## 2022-02-03 ENCOUNTER — Other Ambulatory Visit: Payer: Self-pay

## 2022-02-03 ENCOUNTER — Encounter: Payer: Self-pay | Admitting: Cardiology

## 2022-02-03 ENCOUNTER — Ambulatory Visit: Payer: Medicare HMO | Admitting: Cardiology

## 2022-02-03 VITALS — BP 120/72 | HR 65 | Ht 70.0 in | Wt 214.0 lb

## 2022-02-03 DIAGNOSIS — Z951 Presence of aortocoronary bypass graft: Secondary | ICD-10-CM | POA: Diagnosis not present

## 2022-02-03 DIAGNOSIS — E782 Mixed hyperlipidemia: Secondary | ICD-10-CM

## 2022-02-03 DIAGNOSIS — I2510 Atherosclerotic heart disease of native coronary artery without angina pectoris: Secondary | ICD-10-CM | POA: Diagnosis not present

## 2022-02-03 DIAGNOSIS — E7800 Pure hypercholesterolemia, unspecified: Secondary | ICD-10-CM

## 2022-02-03 DIAGNOSIS — I10 Essential (primary) hypertension: Secondary | ICD-10-CM

## 2022-02-03 DIAGNOSIS — I7143 Infrarenal abdominal aortic aneurysm, without rupture: Secondary | ICD-10-CM | POA: Diagnosis not present

## 2022-02-03 NOTE — Patient Instructions (Signed)
Medication Instructions:  ? ?Your physician recommends that you continue on your current medications as directed. Please refer to the Current Medication list given to you today. ? ?*If you need a refill on your cardiac medications before your next appointment, please call your pharmacy* ? ? ?Testing/Procedures: ? ?AAA ULTRASOUND TO BE DONE AT OUR [facility] [facility]--PLEASE SCHEDULE THIS TO BE DONE IN October 2023 PER DR. Johney Frame ? ? ?Follow-Up: ? ?3 MONTHS IN THE OFFICE WITH DR. Johney Frame OR SCOTT WEAVER PA-C ? ? ? ?

## 2022-02-05 DIAGNOSIS — M7061 Trochanteric bursitis, right hip: Secondary | ICD-10-CM | POA: Diagnosis not present

## 2022-02-05 DIAGNOSIS — M1611 Unilateral primary osteoarthritis, right hip: Secondary | ICD-10-CM | POA: Diagnosis not present

## 2022-02-08 DIAGNOSIS — M1712 Unilateral primary osteoarthritis, left knee: Secondary | ICD-10-CM | POA: Diagnosis not present

## 2022-02-09 ENCOUNTER — Ambulatory Visit (INDEPENDENT_AMBULATORY_CARE_PROVIDER_SITE_OTHER): Payer: Medicare HMO | Admitting: Adult Health

## 2022-02-09 ENCOUNTER — Encounter: Payer: Self-pay | Admitting: Adult Health

## 2022-02-09 VITALS — BP 130/70 | HR 57 | Temp 98.5°F | Ht 70.0 in | Wt 216.0 lb

## 2022-02-09 DIAGNOSIS — E7800 Pure hypercholesterolemia, unspecified: Secondary | ICD-10-CM

## 2022-02-09 DIAGNOSIS — R69 Illness, unspecified: Secondary | ICD-10-CM | POA: Diagnosis not present

## 2022-02-09 DIAGNOSIS — Z0000 Encounter for general adult medical examination without abnormal findings: Secondary | ICD-10-CM | POA: Diagnosis not present

## 2022-02-09 DIAGNOSIS — I2510 Atherosclerotic heart disease of native coronary artery without angina pectoris: Secondary | ICD-10-CM | POA: Diagnosis not present

## 2022-02-09 DIAGNOSIS — I10 Essential (primary) hypertension: Secondary | ICD-10-CM

## 2022-02-09 DIAGNOSIS — F5101 Primary insomnia: Secondary | ICD-10-CM

## 2022-02-09 DIAGNOSIS — F339 Major depressive disorder, recurrent, unspecified: Secondary | ICD-10-CM | POA: Diagnosis not present

## 2022-02-09 DIAGNOSIS — Z125 Encounter for screening for malignant neoplasm of prostate: Secondary | ICD-10-CM

## 2022-02-09 LAB — CBC WITH DIFFERENTIAL/PLATELET
Basophils Absolute: 0.1 10*3/uL (ref 0.0–0.1)
Basophils Relative: 1.5 % (ref 0.0–3.0)
Eosinophils Absolute: 0.3 10*3/uL (ref 0.0–0.7)
Eosinophils Relative: 3.3 % (ref 0.0–5.0)
HCT: 47.8 % (ref 39.0–52.0)
Hemoglobin: 16.2 g/dL (ref 13.0–17.0)
Lymphocytes Relative: 21.7 % (ref 12.0–46.0)
Lymphs Abs: 1.8 10*3/uL (ref 0.7–4.0)
MCHC: 33.8 g/dL (ref 30.0–36.0)
MCV: 94.8 fl (ref 78.0–100.0)
Monocytes Absolute: 0.8 10*3/uL (ref 0.1–1.0)
Monocytes Relative: 9.7 % (ref 3.0–12.0)
Neutro Abs: 5.2 10*3/uL (ref 1.4–7.7)
Neutrophils Relative %: 63.8 % (ref 43.0–77.0)
Platelets: 180 10*3/uL (ref 150.0–400.0)
RBC: 5.04 Mil/uL (ref 4.22–5.81)
RDW: 14.5 % (ref 11.5–15.5)
WBC: 8.1 10*3/uL (ref 4.0–10.5)

## 2022-02-09 LAB — COMPREHENSIVE METABOLIC PANEL
ALT: 30 U/L (ref 0–53)
AST: 23 U/L (ref 0–37)
Albumin: 4.4 g/dL (ref 3.5–5.2)
Alkaline Phosphatase: 86 U/L (ref 39–117)
BUN: 19 mg/dL (ref 6–23)
CO2: 32 mEq/L (ref 19–32)
Calcium: 10.1 mg/dL (ref 8.4–10.5)
Chloride: 101 mEq/L (ref 96–112)
Creatinine, Ser: 1.28 mg/dL (ref 0.40–1.50)
GFR: 55.07 mL/min — ABNORMAL LOW (ref >60.00)
Glucose, Bld: 90 mg/dL (ref 70–99)
Potassium: 4.2 mEq/L (ref 3.5–5.1)
Sodium: 140 mEq/L (ref 135–145)
Total Bilirubin: 0.8 mg/dL (ref 0.2–1.2)
Total Protein: 6.9 g/dL (ref 6.0–8.3)

## 2022-02-09 LAB — LIPID PANEL
Cholesterol: 131 mg/dL (ref 0–200)
HDL: 43.6 mg/dL (ref >39.00)
LDL Cholesterol: 72 mg/dL (ref 0–99)
NonHDL: 87.44
Total CHOL/HDL Ratio: 3
Triglycerides: 78 mg/dL (ref 0.0–149.0)
VLDL: 15.6 mg/dL (ref 0.0–40.0)

## 2022-02-09 LAB — PSA: PSA: 1.63 ng/mL (ref 0.10–4.00)

## 2022-02-09 LAB — TSH: TSH: 3.57 u[IU]/mL (ref 0.35–5.50)

## 2022-02-09 MED ORDER — AZELASTINE HCL 0.1 % NA SOLN: 1.0000 | Freq: Two times a day (BID) | NASAL | 12 refills | Status: AC

## 2022-02-09 NOTE — Patient Instructions (Signed)
It was great seeing you today   We will follow up with you regarding your lab work   Please let me know if you need anything   

## 2022-02-09 NOTE — Progress Notes (Signed)
? ?Subjective:  ? ? Patient ID: Dave Robles, male    DOB: 1946/03/20, 76 y.o.   MRN: [id number] ? ?HPI ?Patient presents for yearly preventative medicine examination. He is a pleasant 76 year old male who  has a past medical history of AAA (abdominal aortic aneurysm), AAA (abdominal aortic aneurysm) without rupture (01/19/2017), Adjustment disorder with depressed mood (03/04/2009), Arthritis, Colon polyps, CORONARY ARTERY DISEASE (07/10/2007), Coronary artery disease involving native coronary artery of native heart without angina pectoris (07/10/2007), Depression, DOE (dyspnea on exertion), Episodic mood disorder ([place]) (08/01/2014), ERECTILE DYSFUNCTION (07/10/2007), Extrinsic asthma, unspecified (09/10/2008), HYPERLIPIDEMIA (07/10/2007), HYPERTENSION (07/10/2007), Myocardial infarction ([place]) (2016), OA (osteoarthritis) of hip (04/19/2018), Onychomycosis (05/14/2014), Pain in joint of left knee (01/19/2017), Personal history of colonic polyps (07/08/2014), S/P CABG x 2 (05/27/2020), and TOBACCO USE (12/15/2009). ? ?CAD/hyperlipidemia-history of CABG x3 with LIMA to LAD, SVG to DX and SVG to distal RCA in 1991.  He had non-ST elevation MI while vacationing in [place] in 2016.  At this time was Noted to have normal left main, 90% proximal LAD, 99% mid LAD with patent LIMA to the mid LAD and patent SVG to DX. ?2021 by cardiology due to [place] at which time he had a cardiac cath on 05/27/2020 he underwent a redo coronary to bypass grafting x2.  He is currently prescribed Lipitor 80 mg daily and Plavix 75 mg daily.  He is followed by cardiology on a routine basis.  Denies myalgia, fatigue, shortness of breath, or chest pain ?Lab Results  ?Component Value Date  ? CHOL 101 02/04/2021  ? HDL 33.10 (L) 02/04/2021  ? [place] 48 02/04/2021  ? LDLDIRECT 66.7 11/30/2011  ? TRIG 100.0 02/04/2021  ? CHOLHDL 3 02/04/2021  ? ?Essential hypertension-managed with  lisinopril 20 mg, Lasix 20 mg every other day, and metoprolol 25 mg twice daily. His  cardiologist in [place] started him on HCTZ 12.5 mg for elevated blood pressure readings.  He denies dizziness, lightheadedness, chest pain, syncopal episodes. ?BP Readings from Last 3 Encounters:  ?02/09/22 130/70  ?02/03/22 120/72  ?08/12/21 120/78  ? ?Depression- Takes Wellbutrin 300 mg ER. He does not feel as though the Wellbutrin is helping any longer. He feels as though he has stopped doing a lot of the activities that he used to do, he is not working out as much as he would like, and feels as though he has had some social relationships have fallen by the waste side.  ? ?Insomnia-prescribed trazodone 100 mg QHS ? ?All immunizations and health maintenance protocols were reviewed with the patient and needed orders were placed. ? ?Appropriate screening laboratory values were ordered for the patient including screening of hyperlipidemia, renal function and hepatic function. ?If indicated by BPH, a PSA was ordered. ? ?Medication reconciliation,  past medical history, social history, problem list and allergies were reviewed in detail with the patient ? ?Goals were established with regard to weight loss, exercise, and  diet in compliance with medications ?Wt Readings from Last 3 Encounters:  ?02/09/22 216 lb (98 kg)  ?02/03/22 214 lb (97.1 kg)  ?01/04/22 208 lb (94.3 kg)  ? ?Review of Systems  ?Constitutional: Negative.   ?HENT: Negative.    ?Eyes: Negative.   ?Respiratory: Negative.    ?Cardiovascular: Negative.   ?Gastrointestinal: Negative.   ?Endocrine: Negative.   ?Genitourinary: Negative.   ?Musculoskeletal: Negative.   ?Skin: Negative.   ?Allergic/Immunologic: Negative.   ?Neurological: Negative.   ?Hematological: Negative.   ?Psychiatric/Behavioral: Negative.    ?All other  systems reviewed and are negative. ? ?Past Medical History:  ?Diagnosis Date  ? AAA (abdominal aortic aneurysm)   ? AAA (abdominal aortic aneurysm) without rupture 01/19/2017  ? Adjustment disorder with depressed mood 03/04/2009  ? Arthritis    ? Colon polyps   ? CORONARY ARTERY DISEASE 07/10/2007  ? Coronary artery disease involving native coronary artery of native heart without angina pectoris 07/10/2007  ? Qualifier: Diagnosis of  By: Scherrie Gerlach    ? Depression   ? DOE (dyspnea on exertion)   ? Episodic mood disorder ([place]) 08/01/2014  ? ERECTILE DYSFUNCTION 07/10/2007  ? Extrinsic asthma, unspecified 09/10/2008  ? HYPERLIPIDEMIA 07/10/2007  ? HYPERTENSION 07/10/2007  ? Myocardial infarction [facility]) 2016  ? when had angioplasty- was told had a silent MI  ? OA (osteoarthritis) of hip 04/19/2018  ? Onychomycosis 05/14/2014  ? Pain in joint of left knee 01/19/2017  ? Personal history of colonic polyps 07/08/2014  ? 1997, 2001 8 mm transverse serrated polyp right colon 2010 07/08/2014 no polyps - repeat colon 2022     ? S/P CABG x 2 05/27/2020  ? TOBACCO USE 12/15/2009  ? ? ?Social History  ? ?Socioeconomic History  ? Marital status: Married  ?  Spouse name: Not on file  ? Number of children: Not on file  ? Years of education: Not on file  ? Highest education level: Not on file  ?Occupational History  ? Not on file  ?Tobacco Use  ? Smoking status: Former  ?  Packs/day: 0.50  ?  Types: Cigarettes  ?  Quit date: 10  ?  Years since quitting: 32.2  ? Smokeless tobacco: Never  ?Vaping Use  ? Vaping Use: Never used  ?Substance and Sexual Activity  ? Alcohol use: Yes  ?  Alcohol/week: 1.0 standard drink  ?  Types: 1 Cans of beer per week  ?  Comment: Gin 2 x week  ? Drug use: No  ? Sexual activity: Yes  ?  Comment: regular exercise - yes  ?Other Topics Concern  ? Not on file  ?Social History Narrative  ? Retired from being an Financial controller of a small business   ? Married   ? No kids   ?   ? He likes to play golf and rides bike   ?   ? ?Social Determinants of Health  ? ?Financial Resource Strain: Low Risk   ? Difficulty of Paying Living Expenses: Not hard at all  ?Food Insecurity: No Food Insecurity  ? Worried About Charity fundraiser in the Last Year: Never true  ? Ran Out of Food  in the Last Year: Never true  ?Transportation Needs: No Transportation Needs  ? Lack of Transportation (Medical): No  ? Lack of Transportation (Non-Medical): No  ?Physical Activity: Sufficiently Active  ? Days of Exercise per Week: 4 days  ? Minutes of Exercise per Session: 100 min  ?Stress: No Stress Concern Present  ? Feeling of Stress : Not at all  ?Social Connections: Not on file  ?Intimate Partner Violence: Not on file  ? ? ?Past Surgical History:  ?Procedure Laterality Date  ? COLONOSCOPY    ? CORONARY ANGIOPLASTY  12/2014  ? CORONARY ARTERY BYPASS GRAFT  11/15/89  ? 5 vessels  ? CORONARY ARTERY BYPASS GRAFT N/A 05/27/2020  ? Procedure: REDO CORONARY ARTERY BYPASS GRAFTING (CABG), ON PUMP, TIMES TWO, USING RIGHT INTERNAL MAMMARY ARTERY AND LEFT RADIAL ARTERY (OPEN);  Surgeon: Grace Isaac, MD;  Location:  [facility];  Service: Open Heart Surgery;  Laterality: N/A;  Left Radial Artery to PDA ?FREE RIMA to OM  ? HYDROCELE EXCISION / REPAIR  2011  ? INGUINAL HERNIA REPAIR Left 1985  ? LEFT HEART CATH AND CORS/GRAFTS ANGIOGRAPHY N/A 05/13/2020  ? Procedure: LEFT HEART CATH AND CORS/GRAFTS ANGIOGRAPHY;  Surgeon: Belva Crome, MD;  Location: [facility];  Service: Cardiovascular;  Laterality: N/A;  ? MEDIASTERNOTOMY N/A 05/27/2020  ? Procedure: MEDIAN STERNOTOMY;  Surgeon: Grace Isaac, MD;  Location: [facility];  Service: Open Heart Surgery;  Laterality: N/A;  Redo Median Sternotomy  ? RADIAL ARTERY HARVEST Left 05/27/2020  ? Procedure: RADIAL ARTERY HARVEST;  Surgeon: Grace Isaac, MD;  Location: [facility];  Service: Open Heart Surgery;  Laterality: Left;  ? TEE WITHOUT CARDIOVERSION N/A 05/27/2020  ? Procedure: TRANSESOPHAGEAL ECHOCARDIOGRAM (TEE);  Surgeon: Grace Isaac, MD;  Location: [facility];  Service: Open Heart Surgery;  Laterality: N/A;  ? TOTAL HIP ARTHROPLASTY Right 04/19/2018  ? Procedure: RIGHT TOTAL HIP ARTHROPLASTY ANTERIOR APPROACH;  Surgeon: Gaynelle Arabian, MD;  [facility];  Service:  Orthopedics;  Laterality: Right;  ? ? ?Family History  ?Problem Relation Age of Onset  ? Coronary artery disease Father   ?     cabg  ? Cancer Mother   ?     Breast and or lung   ? Depression Mother   ? H

## 2022-02-11 ENCOUNTER — Encounter: Payer: Self-pay | Admitting: Physical Therapy

## 2022-02-11 ENCOUNTER — Ambulatory Visit: Payer: Medicare HMO | Attending: Orthopedic Surgery | Admitting: Physical Therapy

## 2022-02-11 DIAGNOSIS — M6281 Muscle weakness (generalized): Secondary | ICD-10-CM | POA: Diagnosis not present

## 2022-02-11 DIAGNOSIS — M25551 Pain in right hip: Secondary | ICD-10-CM | POA: Diagnosis not present

## 2022-02-11 DIAGNOSIS — R262 Difficulty in walking, not elsewhere classified: Secondary | ICD-10-CM | POA: Insufficient documentation

## 2022-02-11 DIAGNOSIS — R2681 Unsteadiness on feet: Secondary | ICD-10-CM | POA: Diagnosis not present

## 2022-02-11 NOTE — Patient Instructions (Signed)
Access Code: [email or web address] ?URL: [email or web address] ?Date: 02/11/2022 ?Prepared by: Ethel Rana ? ?Exercises ?- Supine Quadriceps Stretch with Strap on Table  - 1 x daily - 7 x weekly - 3 sets - 10 reps ?

## 2022-02-11 NOTE — Therapy (Signed)
[facility] ?[place], [place], [address] ?Phone: [phone]   Fax:  [phone] ? ?Physical Therapy Evaluation ? ?Patient Details  ?Name: Dave Robles ?MRN: [id number] ?Date of Birth: 1946/02/22 ?Referring Provider (PT): Aluisio ? ? ?Encounter Date: 02/11/2022 ? ? PT End of Session - 02/11/22 1015   ? ? Visit Number 1   ? Date for PT Re-Evaluation 04/08/22   ? PT Start Time 3419   ? PT Stop Time 1012   ? PT Time Calculation (min) 37 min   ? Activity Tolerance Patient tolerated treatment well   ? ?  ?  ? ?  ? ? ?Past Medical History:  ?Diagnosis Date  ? AAA (abdominal aortic aneurysm)   ? AAA (abdominal aortic aneurysm) without rupture 01/19/2017  ? Adjustment disorder with depressed mood 03/04/2009  ? Arthritis   ? Colon polyps   ? CORONARY ARTERY DISEASE 07/10/2007  ? Coronary artery disease involving native coronary artery of native heart without angina pectoris 07/10/2007  ? Qualifier: Diagnosis of  By: Scherrie Gerlach    ? Depression   ? DOE (dyspnea on exertion)   ? Episodic mood disorder ([place]) 08/01/2014  ? ERECTILE DYSFUNCTION 07/10/2007  ? Extrinsic asthma, unspecified 09/10/2008  ? HYPERLIPIDEMIA 07/10/2007  ? HYPERTENSION 07/10/2007  ? Myocardial infarction [facility]) 2016  ? when had angioplasty- was told had a silent MI  ? OA (osteoarthritis) of hip 04/19/2018  ? Onychomycosis 05/14/2014  ? Pain in joint of left knee 01/19/2017  ? Personal history of colonic polyps 07/08/2014  ? 1997, 2001 8 mm transverse serrated polyp right colon 2010 07/08/2014 no polyps - repeat colon 2022     ? S/P CABG x 2 05/27/2020  ? TOBACCO USE 12/15/2009  ? ? ?Past Surgical History:  ?Procedure Laterality Date  ? COLONOSCOPY    ? CORONARY ANGIOPLASTY  12/2014  ? CORONARY ARTERY BYPASS GRAFT  11/15/89  ? 5 vessels  ? CORONARY ARTERY BYPASS GRAFT N/A 05/27/2020  ? Procedure: REDO CORONARY ARTERY BYPASS GRAFTING (CABG), ON PUMP, TIMES TWO, USING RIGHT INTERNAL MAMMARY ARTERY AND LEFT  RADIAL ARTERY (OPEN);  Surgeon: Grace Isaac, MD;  Location: [facility];  Service: Open Heart Surgery;  Laterality: N/A;  Left Radial Artery to PDA ?FREE RIMA to OM  ? HYDROCELE EXCISION / REPAIR  2011  ? INGUINAL HERNIA REPAIR Left 1985  ? LEFT HEART CATH AND CORS/GRAFTS ANGIOGRAPHY N/A 05/13/2020  ? Procedure: LEFT HEART CATH AND CORS/GRAFTS ANGIOGRAPHY;  Surgeon: Belva Crome, MD;  Location: [facility];  Service: Cardiovascular;  Laterality: N/A;  ? MEDIASTERNOTOMY N/A 05/27/2020  ? Procedure: MEDIAN STERNOTOMY;  Surgeon: Grace Isaac, MD;  Location: [facility];  Service: Open Heart Surgery;  Laterality: N/A;  Redo Median Sternotomy  ? RADIAL ARTERY HARVEST Left 05/27/2020  ? Procedure: RADIAL ARTERY HARVEST;  Surgeon: Grace Isaac, MD;  Location: [facility];  Service: Open Heart Surgery;  Laterality: Left;  ? TEE WITHOUT CARDIOVERSION N/A 05/27/2020  ? Procedure: TRANSESOPHAGEAL ECHOCARDIOGRAM (TEE);  Surgeon: Grace Isaac, MD;  Location: [facility];  Service: Open Heart Surgery;  Laterality: N/A;  ? TOTAL HIP ARTHROPLASTY Right 04/19/2018  ? Procedure: RIGHT TOTAL HIP ARTHROPLASTY ANTERIOR APPROACH;  Surgeon: Gaynelle Arabian, MD;  [facility];  Service: Orthopedics;  Laterality: Right;  ? ? ?There were no vitals filed for this visit. ? ? ? Subjective Assessment - 02/11/22 0937   ? ? Subjective  Patient's R hip lateral pain upon initiating a walk. It usually improves as he progresses, but not always. He feels like he lost a lot of strength after having heart surgery. he does not currently perform any strength training.   ? Pertinent History MI, HTN, L knee meniscal repair 2014, R THR 2019, Heart surgery 18 months ago.   ? How long can you sit comfortably? N/A   ? How long can you stand comfortably? N/A   ? How long can you walk comfortably? Hurts upon initiating gait, improves with more walking usually.   ? Patient Stated Goals Relieve the pain.   ? Currently in Pain? No/denies   ? ?  ?  ? ?   ? ? ? ? ? [facility] PT Assessment - 02/11/22 0001   ? ?  ? Assessment  ? Medical Diagnosis R lateral hip pain   ? Referring Provider (PT) Aluisio   ?  ? Precautions  ? Precautions None   ?  ? Balance Screen  ? Has the patient fallen in the past 6 months Yes   ? How many times? 1   Riding his bike.  ?  ? Home Environment  ? Living Environment Private residence   ? Living Arrangements Spouse/significant other   ? Type of Home House   ? Home Access Stairs to enter   ? Entrance Stairs-Number of Steps 5   ? Entrance Stairs-Rails Can reach both   ? Home Layout Two level   ? [place] - single point   ?  ? Prior Function  ? Level of Independence Independent   ? Vocation Retired   ? Leisure golf, tennis, riding bike, mowing lawn   ?  ? Cognition  ? Overall Cognitive Status Within Functional Limits for tasks assessed   ?  ? AROM  ? Right/Left Hip Right;Left   ? Right Hip Extension 5   ? Left Hip Extension 5   ?  ? Strength  ? Overall Strength Comments Hip strength 4/5 R, 4+/5 L, otherwise WFL   ?  ? Flexibility  ? Hamstrings WFL   ? ITB mild tightness R   ? Quadratus Lumborum Bil tightness   ?  ? Palpation  ? Palpation comment lateral R hip, prox ITB   ? ?  ?  ? ?  ? ? ? ? ? ? ? ? ? ? ? ? ? ?Objective measurements completed on examination: See above findings.  ? ? ? ? ? ? ? ? ? ? ? ? ? ? ? ? PT Short Term Goals - 02/11/22 1142   ? ?  ? PT SHORT TERM GOAL #1  ? Title I with Initial HEP for flexiblity    ? Time 2   ? Period Weeks   ? Status New   ? Target Date 02/25/22   ? ?  ?  ? ?  ? ? ? ? PT Long Term Goals - 02/11/22 1252   ? ?  ? PT LONG TERM GOAL #1  ? Title I with final HEP   ? Time 8   ? Period Weeks   ? Status New   ? Target Date 04/08/22   ?  ? PT LONG TERM GOAL #2  ? Title Patient will stand and walk withR hip pain<3/10.   ? Baseline Reports hip pain strong enough to make him stop after a few minutes of wlking.   ? Time 8   ? Period Weeks   ?  Status New   ? Target Date 04/08/22   ?  ? PT LONG TERM GOAL #3  ?  Title Patient able to descend stairs with a reciprocal gait pattern with UE support   ? Baseline Step to.   ? Time 8   ? Period Weeks   ? Status New   ? Target Date 04/08/22   ?  ? PT LONG TERM GOAL #4  ? Title Increase B hip strength to 5/5   ? Baseline 4-(4+)/5   ? Time 8   ? Period Weeks   ? Status New   ? Target Date 04/08/22   ? ?  ?  ? ?  ? ? ? ? ? ? ? ? ? Plan - 02/11/22 1055   ? ? Clinical Impression Statement Patient presents with R lateral hip pain. He usually notices it when he first starts walking. He stops and stretches, and the pain usually resolves and he can continue, but not always. he notes that he does not perform any strength training. He was diagnosed with R hip bursitis, but had a THR in the past. He demosntrates TTP over lateral R hip, tight hip flexors B, mild weakness in R hip. He will benefit from PT for stretching and strengthening of hips, overall trunk strengthening to minimize pain in hip with activity.   ? Personal Factors and Comorbidities Fitness;Past/Current Experience;Comorbidity 2   ? Comorbidities heart surgery, THR R   ? Examination-Activity Limitations Locomotion Level   ? Stability/Clinical Decision Making Stable/Uncomplicated   ? Clinical Decision Making Low   ? Rehab Potential Good   ? PT Frequency 2x / week   ? PT Duration 8 weeks   ? PT Treatment/Interventions ADLs/Self Care Home Management;Gait training;Stair training;Therapeutic exercise;Therapeutic activities;Neuromuscular re-education;Patient/family education;Passive range of motion;Manual techniques   ? PT Next Visit Plan Stretch and strengthening for hips.   ? PT Home Exercise Plan Hip flexor/quad stretch   ? Consulted and Agree with Plan of Care Patient   ? ?  ?  ? ?  ? ? ?Patient will benefit from skilled therapeutic intervention in order to improve the following deficits and impairments:  Abnormal gait, Decreased strength, Decreased range of motion, Impaired flexibility, Postural dysfunction, Pain, Difficulty  walking ? ?Visit Diagnosis: ?Difficulty in walking, not elsewhere classified ? ?Pain in right hip ? ?Muscle weakness (generalized) ? ?Unsteadiness on feet ? ? ? ? ?Problem List ?Patient Active Problem List  ? Diagnosi

## 2022-02-16 ENCOUNTER — Ambulatory Visit: Payer: Medicare HMO | Attending: Orthopedic Surgery | Admitting: Physical Therapy

## 2022-02-16 DIAGNOSIS — R262 Difficulty in walking, not elsewhere classified: Secondary | ICD-10-CM | POA: Diagnosis not present

## 2022-02-16 DIAGNOSIS — M25551 Pain in right hip: Secondary | ICD-10-CM | POA: Insufficient documentation

## 2022-02-16 DIAGNOSIS — M6281 Muscle weakness (generalized): Secondary | ICD-10-CM | POA: Diagnosis not present

## 2022-02-16 NOTE — Patient Instructions (Signed)

## 2022-02-16 NOTE — Therapy (Signed)
[facility] ?[address] ?[place], [place], [address] ?Phone: [phone]   Fax:  [phone] ? ?Physical Therapy Treatment ? ?Patient Details  ?Name: Dave Robles ?MRN: [id number] ?Date of Birth: 1946/11/09 ?Referring Provider (PT): Aluisio ? ? ?Encounter Date: 02/16/2022 ? ? PT End of Session - 02/16/22 7062   ? ? Visit Number 2   ? Number of Visits 16   ? Date for PT Re-Evaluation 04/08/22   ? PT Start Time [phone]   ? PT Stop Time 0831   ? PT Time Calculation (min) 36 min   ? ?  ?  ? ?  ? ? ?Past Medical History:  ?Diagnosis Date  ? AAA (abdominal aortic aneurysm)   ? AAA (abdominal aortic aneurysm) without rupture 01/19/2017  ? Adjustment disorder with depressed mood 03/04/2009  ? Arthritis   ? Colon polyps   ? CORONARY ARTERY DISEASE 07/10/2007  ? Coronary artery disease involving native coronary artery of native heart without angina pectoris 07/10/2007  ? Qualifier: Diagnosis of  By: Scherrie Gerlach    ? Depression   ? DOE (dyspnea on exertion)   ? Episodic mood disorder ([place]) 08/01/2014  ? ERECTILE DYSFUNCTION 07/10/2007  ? Extrinsic asthma, unspecified 09/10/2008  ? HYPERLIPIDEMIA 07/10/2007  ? HYPERTENSION 07/10/2007  ? Myocardial infarction [facility]) 2016  ? when had angioplasty- was told had a silent MI  ? OA (osteoarthritis) of hip 04/19/2018  ? Onychomycosis 05/14/2014  ? Pain in joint of left knee 01/19/2017  ? Personal history of colonic polyps 07/08/2014  ? 1997, 2001 8 mm transverse serrated polyp right colon 2010 07/08/2014 no polyps - repeat colon 2022     ? S/P CABG x 2 05/27/2020  ? TOBACCO USE 12/15/2009  ? ? ?Past Surgical History:  ?Procedure Laterality Date  ? COLONOSCOPY    ? CORONARY ANGIOPLASTY  12/2014  ? CORONARY ARTERY BYPASS GRAFT  11/15/89  ? 5 vessels  ? CORONARY ARTERY BYPASS GRAFT N/A 05/27/2020  ? Procedure: REDO CORONARY ARTERY BYPASS GRAFTING (CABG), ON PUMP, TIMES TWO, USING RIGHT INTERNAL MAMMARY ARTERY AND LEFT RADIAL ARTERY (OPEN);  Surgeon:  Grace Isaac, MD;  Location: [facility];  Service: Open Heart Surgery;  Laterality: N/A;  Left Radial Artery to PDA ?FREE RIMA to OM  ? HYDROCELE EXCISION / REPAIR  2011  ? INGUINAL HERNIA REPAIR Left 1985  ? LEFT HEART CATH AND CORS/GRAFTS ANGIOGRAPHY N/A 05/13/2020  ? Procedure: LEFT HEART CATH AND CORS/GRAFTS ANGIOGRAPHY;  Surgeon: Belva Crome, MD;  Location: [facility];  Service: Cardiovascular;  Laterality: N/A;  ? MEDIASTERNOTOMY N/A 05/27/2020  ? Procedure: MEDIAN STERNOTOMY;  Surgeon: Grace Isaac, MD;  Location: [facility];  Service: Open Heart Surgery;  Laterality: N/A;  Redo Median Sternotomy  ? RADIAL ARTERY HARVEST Left 05/27/2020  ? Procedure: RADIAL ARTERY HARVEST;  Surgeon: Grace Isaac, MD;  Location: [facility];  Service: Open Heart Surgery;  Laterality: Left;  ? TEE WITHOUT CARDIOVERSION N/A 05/27/2020  ? Procedure: TRANSESOPHAGEAL ECHOCARDIOGRAM (TEE);  Surgeon: Grace Isaac, MD;  Location: [facility];  Service: Open Heart Surgery;  Laterality: N/A;  ? TOTAL HIP ARTHROPLASTY Right 04/19/2018  ? Procedure: RIGHT TOTAL HIP ARTHROPLASTY ANTERIOR APPROACH;  Surgeon: Gaynelle Arabian, MD;  [facility];  Service: Orthopedics;  Laterality: Right;  ? ? ?There were no vitals filed for this visit. ? ? Subjective Assessment - 02/16/22 0756   ? ? Subjective walked alot on  golf coirse yesterday, more tneder. doing 1 ex from eval   ? Currently in Pain? Yes   ? Pain Score 4    ? Pain Location Hip   ? Pain Orientation Right;Lateral   ? ?  ?  ? ?  ? ? ? ? ? ? ? ? ? ? ? ? ? ? ? ? ? ? ? ? [facility] Adult PT Treatment/Exercise - 02/16/22 0001   ? ?  ? Manual Therapy  ? Manual Therapy Soft tissue mobilization;Passive ROM   ? Soft tissue mobilization ITB with stripping and DN   Simultaneous filing. User may not have seen previous data.  ? Passive ROM RT LE   ? ?  ?  ? ?  ? ? ? Trigger Point Dry Needling - 02/16/22 0001   ? ? Consent Given? Yes   ? Education Handout Provided Yes   ? Muscles Treated Lower  Quadrant Vastus lateralis   ? Vastus lateralis Response Twitch response elicited;Palpable increased muscle length   ? ?  ?  ? ?  ? ? ? ? ? ? ? ? PT Education - 02/16/22 0822   ? ? Education Details [id number] SLR and abd with tband, bridging and HS stretching   ? Person(s) Educated Patient   ? Methods Explanation;Demonstration;Handout   ? Comprehension Verbalized understanding;Returned demonstration   ? ?  ?  ? ?  ? ? ? PT Short Term Goals - 02/16/22 0832   ? ?  ? PT SHORT TERM GOAL #1  ? Title I with Initial HEP for flexiblity    ? Status Achieved   ? ?  ?  ? ?  ? ? ? ? PT Long Term Goals - 02/11/22 1252   ? ?  ? PT LONG TERM GOAL #1  ? Title I with final HEP   ? Time 8   ? Period Weeks   ? Status New   ? Target Date 04/08/22   ?  ? PT LONG TERM GOAL #2  ? Title Patient will stand and walk withR hip pain<3/10.   ? Baseline Reports hip pain strong enough to make him stop after a few minutes of wlking.   ? Time 8   ? Period Weeks   ? Status New   ? Target Date 04/08/22   ?  ? PT LONG TERM GOAL #3  ? Title Patient able to descend stairs with a reciprocal gait pattern with UE support   ? Baseline Step to.   ? Time 8   ? Period Weeks   ? Status New   ? Target Date 04/08/22   ?  ? PT LONG TERM GOAL #4  ? Title Increase B hip strength to 5/5   ? Baseline 4-(4+)/5   ? Time 8   ? Period Weeks   ? Status New   ? Target Date 04/08/22   ? ?  ?  ? ?  ? ? ? ? ? ? ? ? Plan - 02/16/22 0832   ? ? Clinical Impression Statement pt arrived with som eincreased lateral leg pain after walking golf course yesterday. STW to lateral leg with multi TP and tenderness noted, added DN and stretching. Issued HEP to focus on tight HS and hip flexor and abd strength   ? PT Treatment/Interventions ADLs/Self Care Home Management;Gait training;Stair training;Therapeutic exercise;Therapeutic activities;Neuromuscular re-education;Patient/family education;Passive range of motion;Manual techniques   ? PT Next Visit Plan assess response to today session and  progress   ? ?  ?  ? ?  ? ? ?  Patient will benefit from skilled therapeutic intervention in order to improve the following deficits and impairments:  Abnormal gait, Decreased strength, Decreased range of motion, Impaired flexibility, Postural dysfunction, Pain, Difficulty walking ? ?Visit Diagnosis: ?Pain in right hip ? ?Muscle weakness (generalized) ? ? ? ? ?Problem List ?Patient Active Problem List  ? Diagnosis Date Noted  ? S/P CABG x 2 05/27/2020  ? DOE (dyspnea on exertion)   ? OA (osteoarthritis) of hip 04/19/2018  ? AAA (abdominal aortic aneurysm) without rupture ([place]) 01/19/2017  ? Pain in joint of left knee 01/19/2017  ? Episodic mood disorder ([place]) 08/01/2014  ? Personal history of colonic polyps 07/08/2014  ? Onychomycosis 05/14/2014  ? Hyperlipidemia 07/10/2007  ? ERECTILE DYSFUNCTION 07/10/2007  ? Essential hypertension 07/10/2007  ? Coronary artery disease involving native coronary artery of native heart without angina pectoris 07/10/2007  ? ? ?Camry Robello,ANGIE, PTA ?02/16/2022, 8:36 AM ? ? ?[facility] ?[address] ?[place], [place], [address] ?Phone: [phone]   Fax:  [phone] ? ?Name: Jontay Maston [place] ?MRN: [id number] ?Date of Birth: [birth year] ? ? ? ?

## 2022-02-17 ENCOUNTER — Telehealth (INDEPENDENT_AMBULATORY_CARE_PROVIDER_SITE_OTHER): Payer: Medicare HMO | Admitting: Adult Health

## 2022-02-17 ENCOUNTER — Encounter: Payer: Self-pay | Admitting: Adult Health

## 2022-02-17 VITALS — Ht 70.0 in | Wt 216.0 lb

## 2022-02-17 DIAGNOSIS — H6983 Other specified disorders of Eustachian tube, bilateral: Secondary | ICD-10-CM

## 2022-02-17 NOTE — Progress Notes (Signed)
Virtual Visit via Video Note ? ?I connected with Dave Robles on 02/17/22 at  4:00 PM EDT by a video enabled telemedicine application and verified that I am speaking with the correct person using two identifiers. ? Location patient: home ?Location provider:work or home office ?Persons participating in the virtual visit: patient, provider ? ?I discussed the limitations of evaluation and management by telemedicine and the availability of in person appointments. The patient expressed understanding and agreed to proceed. ? ? ?HPI: ?76 year old male who is being evaluated today for an acute issue.  His symptoms started yesterday.  Symptoms include low-grade headache and a feeling of ear fullness.  He reports that "it sounds like him in a tin can".  He denies fevers, chills, sinus pain or pressure or cough.  He started using Flonase but has not had any relief from this just yet. ? ?He has not noticed any drainage from his ears ? ? ?ROS: See pertinent positives and negatives per HPI. ? ?Past Medical History:  ?Diagnosis Date  ? AAA (abdominal aortic aneurysm) ([place])   ? AAA (abdominal aortic aneurysm) without rupture ([place]) 01/19/2017  ? Adjustment disorder with depressed mood 03/04/2009  ? Arthritis   ? Colon polyps   ? CORONARY ARTERY DISEASE 07/10/2007  ? Coronary artery disease involving native coronary artery of native heart without angina pectoris 07/10/2007  ? Qualifier: Diagnosis of  By: Scherrie Gerlach    ? Depression   ? DOE (dyspnea on exertion)   ? Episodic mood disorder ([place]) 08/01/2014  ? ERECTILE DYSFUNCTION 07/10/2007  ? Extrinsic asthma, unspecified 09/10/2008  ? HYPERLIPIDEMIA 07/10/2007  ? HYPERTENSION 07/10/2007  ? Myocardial infarction [facility]) 2016  ? when had angioplasty- was told had a silent MI  ? OA (osteoarthritis) of hip 04/19/2018  ? Onychomycosis 05/14/2014  ? Pain in joint of left knee 01/19/2017  ? Personal history of colonic polyps 07/08/2014  ? 1997, 2001 8 mm transverse serrated polyp right colon 2010  07/08/2014 no polyps - repeat colon 2022     ? S/P CABG x 2 05/27/2020  ? TOBACCO USE 12/15/2009  ? ? ?Past Surgical History:  ?Procedure Laterality Date  ? COLONOSCOPY    ? CORONARY ANGIOPLASTY  12/2014  ? CORONARY ARTERY BYPASS GRAFT  11/15/89  ? 5 vessels  ? CORONARY ARTERY BYPASS GRAFT N/A 05/27/2020  ? Procedure: REDO CORONARY ARTERY BYPASS GRAFTING (CABG), ON PUMP, TIMES TWO, USING RIGHT INTERNAL MAMMARY ARTERY AND LEFT RADIAL ARTERY (OPEN);  Surgeon: Grace Isaac, MD;  Location: [place];  Service: Open Heart Surgery;  Laterality: N/A;  Left Radial Artery to PDA ?FREE RIMA to OM  ? HYDROCELE EXCISION / REPAIR  2011  ? INGUINAL HERNIA REPAIR Left 1985  ? LEFT HEART CATH AND CORS/GRAFTS ANGIOGRAPHY N/A 05/13/2020  ? Procedure: LEFT HEART CATH AND CORS/GRAFTS ANGIOGRAPHY;  Surgeon: Belva Crome, MD;  Location: [place] CV LAB;  Service: Cardiovascular;  Laterality: N/A;  ? MEDIASTERNOTOMY N/A 05/27/2020  ? Procedure: MEDIAN STERNOTOMY;  Surgeon: Grace Isaac, MD;  Location: [place];  Service: Open Heart Surgery;  Laterality: N/A;  Redo Median Sternotomy  ? RADIAL ARTERY HARVEST Left 05/27/2020  ? Procedure: RADIAL ARTERY HARVEST;  Surgeon: Grace Isaac, MD;  Location: [place];  Service: Open Heart Surgery;  Laterality: Left;  ? TEE WITHOUT CARDIOVERSION N/A 05/27/2020  ? Procedure: TRANSESOPHAGEAL ECHOCARDIOGRAM (TEE);  Surgeon: Grace Isaac, MD;  Location: [place];  Service: Open Heart Surgery;  Laterality: N/A;  ?  TOTAL HIP ARTHROPLASTY Right 04/19/2018  ? Procedure: RIGHT TOTAL HIP ARTHROPLASTY ANTERIOR APPROACH;  Surgeon: Gaynelle Arabian, MD;  [facility];  Service: Orthopedics;  Laterality: Right;  ? ? ?Family History  ?Problem Relation Age of Onset  ? Coronary artery disease Father   ?     cabg  ? Cancer Mother   ?     Breast and or lung   ? Depression Mother   ? Hyperlipidemia Other   ? Hypertension Other   ? Depression Paternal Grandfather   ? Colon cancer Neg Hx   ? ? ? ? ? ?Current  Outpatient Medications:  ?  acetaminophen (TYLENOL) 325 MG tablet, Take 650 mg by mouth every 6 (six) hours as needed for moderate pain or headache. , Disp: , Rfl:  ?  atorvastatin (LIPITOR) 80 MG tablet, TAKE 1 TABLET DAILY, Disp: 90 tablet, Rfl: 3 ?  azelastine (ASTELIN) 0.1 % nasal spray, Place 1 spray into both nostrils 2 (two) times daily. Use in each nostril as directed, Disp: 30 mL, Rfl: 12 ?  calcipotriene (DOVONOX) 0.005 % cream, Apply 1 application topically 2 (two) times daily., Disp: , Rfl:  ?  clobetasol cream (TEMOVATE) 6.37 %, Apply 1 application topically daily., Disp: , Rfl:  ?  clopidogrel (PLAVIX) 75 MG tablet, Take 1 tablet (75 mg total) by mouth daily., Disp: 90 tablet, Rfl: 3 ?  furosemide (LASIX) 20 MG tablet, Take 1 tablet (20 mg total) by mouth daily as needed (leg swelling)., Disp: 45 tablet, Rfl: 1 ?  hydrochlorothiazide (HYDRODIURIL) 25 MG tablet, Take 12.5 mg by mouth daily., Disp: , Rfl:  ?  Melatonin 10 MG TABS, Take 20 mg by mouth at bedtime., Disp: , Rfl:  ?  metoprolol succinate (TOPROL XL) 25 MG 24 hr tablet, Take 1 tablet (25 mg total) by mouth daily., Disp: 90 tablet, Rfl: 3 ?  Multiple Vitamin (MULTIVITAMIN) capsule, Take 1 capsule by mouth daily., Disp: , Rfl:  ?  nitroGLYCERIN (NITROSTAT) 0.4 MG SL tablet, Place 1 tablet (0.4 mg total) under the tongue every 5 (five) minutes as needed., Disp: 25 tablet, Rfl: 0 ?  sildenafil (VIAGRA) 100 MG tablet, Take 100 mg by mouth daily as needed for erectile dysfunction., Disp: , Rfl:  ?  traZODone (DESYREL) 100 MG tablet, TAKE 1 TABLET BY MOUTH EVERY DAY AT BEDTIME AS NEEDED FOR SLEEP, Disp: 90 tablet, Rfl: 1 ?  buPROPion (WELLBUTRIN XL) 300 MG 24 hr tablet, Take 1 tablet (300 mg total) by mouth daily., Disp: 90 tablet, Rfl: 1 ?  lisinopril (ZESTRIL) 20 MG tablet, Take 1 tablet (20 mg total) by mouth daily., Disp: 90 tablet, Rfl: 3 ? ?EXAM: ? ?VITALS per patient if applicable: ? ?GENERAL: alert, oriented, appears well and in no acute  distress ? ?HEENT: atraumatic, conjunttiva clear, no obvious abnormalities on inspection of external nose and ears ? ?NECK: normal movements of the head and neck ? ?LUNGS: on inspection no signs of respiratory distress, breathing rate appears normal, no obvious gross SOB, gasping or wheezing ? ?CV: no obvious cyanosis ? ?MS: moves all visible extremities without noticeable abnormality ? ?PSYCH/NEURO: pleasant and cooperative, no obvious depression or anxiety, speech and thought processing grossly intact ? ?ASSESSMENT AND PLAN: ? ?Discussed the following assessment and plan: ? ?1. Eustachian tube dysfunction, bilateral ?-Advised to continue to use Flonase and can add Claritin. ?Can also try using Afrin twice daily for no longer than 3 days.  Can try equalizing pressure in his ears  using Valsalva maneuver.  Follow-up if not improving ? ? ? ?  ?I discussed the assessment and treatment plan with the patient. The patient was provided an opportunity to ask questions and all were answered. The patient agreed with the plan and demonstrated an understanding of the instructions. ?  ?The patient was advised to call back or seek an in-person evaluation if the symptoms worsen or if the condition fails to improve as anticipated. ? ? ?Dorothyann Peng, NP  ? ?

## 2022-02-23 ENCOUNTER — Ambulatory Visit: Payer: Medicare HMO | Admitting: Physical Therapy

## 2022-02-23 DIAGNOSIS — M6281 Muscle weakness (generalized): Secondary | ICD-10-CM

## 2022-02-23 DIAGNOSIS — M25551 Pain in right hip: Secondary | ICD-10-CM

## 2022-02-23 DIAGNOSIS — R262 Difficulty in walking, not elsewhere classified: Secondary | ICD-10-CM

## 2022-02-23 NOTE — Therapy (Signed)
[facility] ?[place], [place], [address] ?Phone: [phone]   Fax:  [phone] ? ?Physical Therapy Treatment ? ?Patient Details  ?Name: Dave Robles ?MRN: [id number] ?Date of Birth: 10-10-1946 ?Referring Provider (PT): Aluisio ? ? ?Encounter Date: 02/23/2022 ? ? PT End of Session - 02/23/22 0837   ? ? Visit Number 3   ? Number of Visits 16   ? Date for PT Re-Evaluation 04/08/22   ? PT Start Time [phone]   ? PT Stop Time [phone]   ? PT Time Calculation (min) 44 min   ? ?  ?  ? ?  ? ? ?Past Medical History:  ?Diagnosis Date  ? AAA (abdominal aortic aneurysm) ([place])   ? AAA (abdominal aortic aneurysm) without rupture ([place]) 01/19/2017  ? Adjustment disorder with depressed mood 03/04/2009  ? Arthritis   ? Colon polyps   ? CORONARY ARTERY DISEASE 07/10/2007  ? Coronary artery disease involving native coronary artery of native heart without angina pectoris 07/10/2007  ? Qualifier: Diagnosis of  By: Scherrie Gerlach    ? Depression   ? DOE (dyspnea on exertion)   ? Episodic mood disorder ([place]) 08/01/2014  ? ERECTILE DYSFUNCTION 07/10/2007  ? Extrinsic asthma, unspecified 09/10/2008  ? HYPERLIPIDEMIA 07/10/2007  ? HYPERTENSION 07/10/2007  ? Myocardial infarction [facility]) 2016  ? when had angioplasty- was told had a silent MI  ? OA (osteoarthritis) of hip 04/19/2018  ? Onychomycosis 05/14/2014  ? Pain in joint of left knee 01/19/2017  ? Personal history of colonic polyps 07/08/2014  ? 1997, 2001 8 mm transverse serrated polyp right colon 2010 07/08/2014 no polyps - repeat colon 2022     ? S/P CABG x 2 05/27/2020  ? TOBACCO USE 12/15/2009  ? ? ?Past Surgical History:  ?Procedure Laterality Date  ? COLONOSCOPY    ? CORONARY ANGIOPLASTY  12/2014  ? CORONARY ARTERY BYPASS GRAFT  11/15/89  ? 5 vessels  ? CORONARY ARTERY BYPASS GRAFT N/A 05/27/2020  ? Procedure: REDO CORONARY ARTERY BYPASS GRAFTING (CABG), ON PUMP, TIMES TWO, USING RIGHT INTERNAL MAMMARY ARTERY AND LEFT RADIAL ARTERY (OPEN);   Surgeon: Grace Isaac, MD;  Location: [place];  Service: Open Heart Surgery;  Laterality: N/A;  Left Radial Artery to PDA ?FREE RIMA to OM  ? HYDROCELE EXCISION / REPAIR  2011  ? INGUINAL HERNIA REPAIR Left 1985  ? LEFT HEART CATH AND CORS/GRAFTS ANGIOGRAPHY N/A 05/13/2020  ? Procedure: LEFT HEART CATH AND CORS/GRAFTS ANGIOGRAPHY;  Surgeon: Belva Crome, MD;  Location: [place] CV LAB;  Service: Cardiovascular;  Laterality: N/A;  ? MEDIASTERNOTOMY N/A 05/27/2020  ? Procedure: MEDIAN STERNOTOMY;  Surgeon: Grace Isaac, MD;  Location: [place];  Service: Open Heart Surgery;  Laterality: N/A;  Redo Median Sternotomy  ? RADIAL ARTERY HARVEST Left 05/27/2020  ? Procedure: RADIAL ARTERY HARVEST;  Surgeon: Grace Isaac, MD;  Location: [place];  Service: Open Heart Surgery;  Laterality: Left;  ? TEE WITHOUT CARDIOVERSION N/A 05/27/2020  ? Procedure: TRANSESOPHAGEAL ECHOCARDIOGRAM (TEE);  Surgeon: Grace Isaac, MD;  Location: [place];  Service: Open Heart Surgery;  Laterality: N/A;  ? TOTAL HIP ARTHROPLASTY Right 04/19/2018  ? Procedure: RIGHT TOTAL HIP ARTHROPLASTY ANTERIOR APPROACH;  Surgeon: Gaynelle Arabian, MD;  [facility];  Service: Orthopedics;  Laterality: Right;  ? ? ?There were no vitals filed for this visit. ? ? Subjective Assessment - 02/23/22 0759   ? ? Subjective ex  are good but like I over did, pain right in hip 8-9 with palpation, walking around minimal   ? Currently in Pain? Yes   ? Pain Score 8    ? Pain Location Hip   ? Pain Orientation Right   ? ?  ?  ? ?  ? ? ? ? ? ? ? ? ? ? ? ? ? ? ? ? ? ? ? ? [place] Adult PT Treatment/Exercise - 02/23/22 0001   ? ?  ? Knee/Hip Exercises: Aerobic  ? Nustep L 5 5 min   LE only  ?  ? Knee/Hip Exercises: Standing  ? Lateral Step Up Both;10 reps;Hand Hold: 2;Step Height: 6"   opp leg abd  ? Forward Step Up Both;10 reps;Hand Hold: 2;Step Height: 6"   opp leg ext  ? Other Standing Knee Exercises hip flex,ext and abd red tband 15 x   ?  ? Knee/Hip Exercises:  Seated  ? Other Seated Knee/Hip Exercises hip flex 2 sets 10   ?  ? Knee/Hip Exercises: Supine  ? Bridges Strengthening;Both;15 reps   feet on ball  ?  ? Modalities  ? Modalities Iontophoresis   ?  ? Iontophoresis  ? Type of Iontophoresis Dexamethasone   ? Location RT GT   ? Dose 1.1 cc   ? Time 73m leave on patch   ?  ? Manual Therapy  ? Manual Therapy Passive ROM   ? Passive ROM RT LE   ? ?  ?  ? ?  ? ? ? ? ? ? ? ? ? ? PT Education - 02/23/22 0820   ? ? Education Details [id number] strap stretch and step ups   ? Person(s) Educated Patient   ? Methods Explanation;Demonstration   ? Comprehension Verbalized understanding;Returned demonstration   ? ?  ?  ? ?  ? ? ? PT Short Term Goals - 02/16/22 0832   ? ?  ? PT SHORT TERM GOAL #1  ? Title I with Initial HEP for flexiblity    ? Status Achieved   ? ?  ?  ? ?  ? ? ? ? PT Long Term Goals - 02/11/22 1252   ? ?  ? PT LONG TERM GOAL #1  ? Title I with final HEP   ? Time 8   ? Period Weeks   ? Status New   ? Target Date 04/08/22   ?  ? PT LONG TERM GOAL #2  ? Title Patient will stand and walk withR hip pain<3/10.   ? Baseline Reports hip pain strong enough to make him stop after a few minutes of wlking.   ? Time 8   ? Period Weeks   ? Status New   ? Target Date 04/08/22   ?  ? PT LONG TERM GOAL #3  ? Title Patient able to descend stairs with a reciprocal gait pattern with UE support   ? Baseline Step to.   ? Time 8   ? Period Weeks   ? Status New   ? Target Date 04/08/22   ?  ? PT LONG TERM GOAL #4  ? Title Increase B hip strength to 5/5   ? Baseline 4-(4+)/5   ? Time 8   ? Period Weeks   ? Status New   ? Target Date 04/08/22   ? ?  ?  ? ?  ? ? ? ? ? ? ? ? Plan - 02/23/22 0837   ? ? Clinical Impression  Statement adjusted stretches in medbridge and added step ups, progressed hip strengthening- weakness noted and some limitations from tightness so cuing and modifications needed added ionto for pin point tenderness   ? PT Treatment/Interventions ADLs/Self Care Home  Management;Gait training;Stair training;Therapeutic exercise;Therapeutic activities;Neuromuscular re-education;Patient/family education;Passive range of motion;Manual techniques   ? PT Next Visit Plan asses ionto and progress strength   ? ?  ?  ? ?  ? ? ?Patient will benefit from skilled therapeutic intervention in order to improve the following deficits and impairments:  Abnormal gait, Decreased strength, Decreased range of motion, Impaired flexibility, Postural dysfunction, Pain, Difficulty walking ? ?Visit Diagnosis: ?Pain in right hip ? ?Muscle weakness (generalized) ? ?Difficulty in walking, not elsewhere classified ? ? ? ? ?Problem List ?Patient Active Problem List  ? Diagnosis Date Noted  ? S/P CABG x 2 05/27/2020  ? DOE (dyspnea on exertion)   ? OA (osteoarthritis) of hip 04/19/2018  ? AAA (abdominal aortic aneurysm) without rupture ([place]) 01/19/2017  ? Pain in joint of left knee 01/19/2017  ? Episodic mood disorder ([place]) 08/01/2014  ? Personal history of colonic polyps 07/08/2014  ? Onychomycosis 05/14/2014  ? Hyperlipidemia 07/10/2007  ? ERECTILE DYSFUNCTION 07/10/2007  ? Essential hypertension 07/10/2007  ? Coronary artery disease involving native coronary artery of native heart without angina pectoris 07/10/2007  ? ? ?Jenine Krisher,ANGIE, PTA ?02/23/2022, 8:40 AM ? ? ?[facility] [address] ?[place], [place], [address] ?Phone: [phone]   Fax:  [phone] ? ?Name: Dave Robles ?MRN: [id number] ?Date of Birth: 29-Jun-1946 ? ? ? ?

## 2022-02-25 ENCOUNTER — Ambulatory Visit: Payer: Medicare HMO | Admitting: Physical Therapy

## 2022-02-25 DIAGNOSIS — R262 Difficulty in walking, not elsewhere classified: Secondary | ICD-10-CM | POA: Diagnosis not present

## 2022-02-25 DIAGNOSIS — M25551 Pain in right hip: Secondary | ICD-10-CM

## 2022-02-25 DIAGNOSIS — M6281 Muscle weakness (generalized): Secondary | ICD-10-CM

## 2022-02-25 NOTE — Therapy (Signed)
[facility] [address] ?[place], [place], [address] ?Phone: [phone]   Fax:  [phone] ? ?Physical Therapy Treatment ? ?Patient Details  ?Name: Dave Robles ?MRN: [id number] ?Date of Birth: [birth year] ?Referring Provider (PT): Aluisio ? ? ?Encounter Date: 02/25/2022 ? ? PT End of Session - 02/25/22 6962   ? ? Visit Number 4   ? Number of Visits 16   ? Date for PT Re-Evaluation 04/08/22   ? PT Start Time [phone]   ? PT Stop Time 0845   ? PT Time Calculation (min) 47 min   ? ?  ?  ? ?  ? ? ?Past Medical History:  ?Diagnosis Date  ? AAA (abdominal aortic aneurysm) ([place])   ? AAA (abdominal aortic aneurysm) without rupture ([place]) 01/19/2017  ? Adjustment disorder with depressed mood 03/04/2009  ? Arthritis   ? Colon polyps   ? CORONARY ARTERY DISEASE 07/10/2007  ? Coronary artery disease involving native coronary artery of native heart without angina pectoris 07/10/2007  ? Qualifier: Diagnosis of  By: Scherrie Gerlach    ? Depression   ? DOE (dyspnea on exertion)   ? Episodic mood disorder ([place]) 08/01/2014  ? ERECTILE DYSFUNCTION 07/10/2007  ? Extrinsic asthma, unspecified 09/10/2008  ? HYPERLIPIDEMIA 07/10/2007  ? HYPERTENSION 07/10/2007  ? Myocardial infarction [facility]) 2016  ? when had angioplasty- was told had a silent MI  ? OA (osteoarthritis) of hip 04/19/2018  ? Onychomycosis 05/14/2014  ? Pain in joint of left knee 01/19/2017  ? Personal history of colonic polyps 07/08/2014  ? 1997, 2001 8 mm transverse serrated polyp right colon 2010 07/08/2014 no polyps - repeat colon 2022     ? S/P CABG x 2 05/27/2020  ? TOBACCO USE 12/15/2009  ? ? ?Past Surgical History:  ?Procedure Laterality Date  ? COLONOSCOPY    ? CORONARY ANGIOPLASTY  12/2014  ? CORONARY ARTERY BYPASS GRAFT  11/15/89  ? 5 vessels  ? CORONARY ARTERY BYPASS GRAFT N/A 05/27/2020  ? Procedure: REDO CORONARY ARTERY BYPASS GRAFTING (CABG), ON PUMP, TIMES TWO, USING RIGHT INTERNAL MAMMARY ARTERY AND LEFT RADIAL ARTERY (OPEN);   Surgeon: Grace Isaac, MD;  Location: [place];  Service: Open Heart Surgery;  Laterality: N/A;  Left Radial Artery to PDA ?FREE RIMA to OM  ? HYDROCELE EXCISION / REPAIR  2011  ? INGUINAL HERNIA REPAIR Left 1985  ? LEFT HEART CATH AND CORS/GRAFTS ANGIOGRAPHY N/A 05/13/2020  ? Procedure: LEFT HEART CATH AND CORS/GRAFTS ANGIOGRAPHY;  Surgeon: Belva Crome, MD;  Location: [place] CV [facility];  Service: Cardiovascular;  Laterality: N/A;  ? MEDIASTERNOTOMY N/A 05/27/2020  ? Procedure: MEDIAN STERNOTOMY;  Surgeon: Grace Isaac, MD;  Location: [place];  Service: Open Heart Surgery;  Laterality: N/A;  Redo Median Sternotomy  ? RADIAL ARTERY HARVEST Left 05/27/2020  ? Procedure: RADIAL ARTERY HARVEST;  Surgeon: Grace Isaac, MD;  Location: [place];  Service: Open Heart Surgery;  Laterality: Left;  ? TEE WITHOUT CARDIOVERSION N/A 05/27/2020  ? Procedure: TRANSESOPHAGEAL ECHOCARDIOGRAM (TEE);  Surgeon: Grace Isaac, MD;  Location: [facility];  Service: Open Heart Surgery;  Laterality: N/A;  ? TOTAL HIP ARTHROPLASTY Right 04/19/2018  ? Procedure: RIGHT TOTAL HIP ARTHROPLASTY ANTERIOR APPROACH;  Surgeon: Gaynelle Arabian, MD;  [facility];  Service: Orthopedics;  Laterality: Right;  ? ? ?There were no vitals filed for this visit. ? ? Subjective Assessment - 02/25/22 0759   ? ? Subjective no  real differences but have not walked extended periods. [facility]   ? Currently in Pain? Yes   ? Pain Score 3    ? Pain Location Hip   ? Pain Orientation Right   ? ?  ?  ? ?  ? ? ? ? ? ? ? ? ? ? ? ? ? ? ? ? ? ? ? ? [place] Adult PT Treatment/Exercise - 02/25/22 0001   ? ?  ? Knee/Hip Exercises: Aerobic  ? Nustep L 5 5 min   LE only  ?  ? Knee/Hip Exercises: Machines for Strengthening  ? Cybex Leg Press 40 # feet 3 way 10 x each   40 # calf raises 2 sets 10  ?  ? Knee/Hip Exercises: Standing  ? Walking with Sports Cord 30# laterally with 4 inch step up 4 x each then fwd 2 each leg   ? Other Standing Knee  Exercises dead lift 5# 15 x   ?  ? Knee/Hip Exercises: Seated  ? Other Seated Knee/Hip Exercises green tband hip ER/ER 15 x each   ?  ? Knee/Hip Exercises: Supine  ? Bridges Strengthening;Both;10 reps   feet on ball  ? Single Leg Bridge Strengthening;Both;5 reps   ? Other Supine Knee/Hip Exercises green tband ER 15 x BIL   ?  ? Iontophoresis  ? Type of Iontophoresis Dexamethasone   ? Location RT GT   ? Dose 1.1 cc   ? Time 69m leave on patch   ?  ? Manual Therapy  ? Manual Therapy Passive ROM   ? Soft tissue mobilization ITB stripping   ? Passive ROM BIL LE and trunk   ? ?  ?  ? ?  ? ? ? ? ? ? ? ? ? ? ? ? PT Short Term Goals - 02/16/22 0832   ? ?  ? PT SHORT TERM GOAL #1  ? Title I with Initial HEP for flexiblity    ? Status Achieved   ? ?  ?  ? ?  ? ? ? ? PT Long Term Goals - 02/25/22 0841   ? ?  ? PT LONG TERM GOAL #1  ? Title I with final HEP   ? Status Partially Met   ?  ? PT LONG TERM GOAL #2  ? Title Patient will stand and walk withR hip pain<3/10.   ? Status On-going   ?  ? PT LONG TERM GOAL #3  ? Title Patient able to descend stairs with a reciprocal gait pattern with UE support   ? Status On-going   ?  ? PT LONG TERM GOAL #4  ? Title Increase B hip strength to 5/5   ? Status On-going   ? ?  ?  ? ?  ? ? ? ? ? ? ? ? Plan - 02/25/22 0842   ? ? Clinical Impression Statement progressed strengthening with focus of rotators.ITB stripping and PROM- ITB very tight educ on using foam roller and or rolling pin. progressing [facility] and [facility]   ? PT Treatment/Interventions ADLs/Self Care Home Management;Gait training;Stair training;Therapeutic exercise;Therapeutic activities;Neuromuscular re-education;Patient/family education;Passive range of motion;Manual techniques   ? PT Next Visit Plan pt OOT 2 weeks assess upon return   ? ?  ?  ? ?  ? ? ?Patient will benefit from skilled therapeutic intervention in order to improve the following deficits and impairments:  Abnormal gait, Decreased strength, Decreased range of  motion, Impaired flexibility, Postural dysfunction, Pain, Difficulty walking ? ?Visit Diagnosis: ?Pain in right hip ? ?Muscle weakness (generalized) ? ?Difficulty in walking, not elsewhere classified ? ? ? ? ?Problem List ?Patient Active Problem List  ? Diagnosis Date Noted  ? S/P CABG x 2 05/27/2020  ? DOE (dyspnea on exertion)   ? OA (osteoarthritis) of hip 04/19/2018  ? AAA (abdominal aortic aneurysm) without rupture ([place]) 01/19/2017  ? Pain in joint of left knee 01/19/2017  ? Episodic mood disorder ([place]) 08/01/2014  ? Personal history of colonic polyps 07/08/2014  ? Onychomycosis 05/14/2014  ? Hyperlipidemia 07/10/2007  ? ERECTILE DYSFUNCTION 07/10/2007  ? Essential hypertension 07/10/2007  ? Coronary artery disease involving native coronary artery of native heart without angina pectoris 07/10/2007  ? ? ?Rishikesh Khachatryan,ANGIE, PTA ?02/25/2022, 8:44 AM ? ? ?[facility] ?[address] ?[place], [place], [address] ?Phone: [phone]   Fax:  [phone] ? ?Name: Dave Robles ?MRN: [id number] ?Date of Birth: 01-01-1946 ? ? ? ?

## 2022-02-28 ENCOUNTER — Other Ambulatory Visit: Payer: Self-pay | Admitting: Physician Assistant

## 2022-02-28 DIAGNOSIS — I2510 Atherosclerotic heart disease of native coronary artery without angina pectoris: Secondary | ICD-10-CM

## 2022-03-16 ENCOUNTER — Ambulatory Visit: Payer: Medicare HMO | Attending: Orthopedic Surgery | Admitting: Physical Therapy

## 2022-03-16 DIAGNOSIS — M25551 Pain in right hip: Secondary | ICD-10-CM

## 2022-03-16 DIAGNOSIS — M6281 Muscle weakness (generalized): Secondary | ICD-10-CM | POA: Diagnosis not present

## 2022-03-16 DIAGNOSIS — R262 Difficulty in walking, not elsewhere classified: Secondary | ICD-10-CM | POA: Diagnosis not present

## 2022-03-16 NOTE — Therapy (Signed)
[facility] [address] ?[place], [place], [address] ?Phone: [phone]   Fax:  [phone] ? ?Physical Therapy Treatment ? ?Patient Details  ?Name: Dave Robles ?MRN: [id number] ?Date of Birth: 07-06-1946 ?Referring Provider (PT): Aluisio ? ? ?Encounter Date: 03/16/2022 ? ? PT End of Session - 03/16/22 0846   ? ? Visit Number 5   ? Number of Visits 16   ? Date for PT Re-Evaluation 04/08/22   ? PT Start Time [phone]   ? PT Stop Time 0845   ? PT Time Calculation (min) 47 min   ? ?  ?  ? ?  ? ? ?Past Medical History:  ?Diagnosis Date  ? AAA (abdominal aortic aneurysm) ([place])   ? AAA (abdominal aortic aneurysm) without rupture ([place]) 01/19/2017  ? Adjustment disorder with depressed mood 03/04/2009  ? Arthritis   ? Colon polyps   ? CORONARY ARTERY DISEASE 07/10/2007  ? Coronary artery disease involving native coronary artery of native heart without angina pectoris 07/10/2007  ? Qualifier: Diagnosis of  By: Scherrie Gerlach    ? Depression   ? DOE (dyspnea on exertion)   ? Episodic mood disorder ([place]) 08/01/2014  ? ERECTILE DYSFUNCTION 07/10/2007  ? Extrinsic asthma, unspecified 09/10/2008  ? HYPERLIPIDEMIA 07/10/2007  ? HYPERTENSION 07/10/2007  ? Myocardial infarction [facility]) 2016  ? when had angioplasty- was told had a silent MI  ? OA (osteoarthritis) of hip 04/19/2018  ? Onychomycosis 05/14/2014  ? Pain in joint of left knee 01/19/2017  ? Personal history of colonic polyps 07/08/2014  ? 1997, 2001 8 mm transverse serrated polyp right colon 2010 07/08/2014 no polyps - repeat colon 2022     ? S/P CABG x 2 05/27/2020  ? TOBACCO USE 12/15/2009  ? ? ?Past Surgical History:  ?Procedure Laterality Date  ? COLONOSCOPY    ? CORONARY ANGIOPLASTY  12/2014  ? CORONARY ARTERY BYPASS GRAFT  11/15/89  ? 5 vessels  ? CORONARY ARTERY BYPASS GRAFT N/A 05/27/2020  ? Procedure: REDO CORONARY ARTERY BYPASS GRAFTING (CABG), ON PUMP, TIMES TWO, USING RIGHT INTERNAL MAMMARY ARTERY AND LEFT RADIAL ARTERY (OPEN);   Surgeon: Grace Isaac, MD;  Location: [facility];  Service: Open Heart Surgery;  Laterality: N/A;  Left Radial Artery to PDA ?FREE RIMA to OM  ? HYDROCELE EXCISION / REPAIR  2011  ? INGUINAL HERNIA REPAIR Left 1985  ? LEFT HEART CATH AND CORS/GRAFTS ANGIOGRAPHY N/A 05/13/2020  ? Procedure: LEFT HEART CATH AND CORS/GRAFTS ANGIOGRAPHY;  Surgeon: Belva Crome, MD;  Location: [facility];  Service: Cardiovascular;  Laterality: N/A;  ? MEDIASTERNOTOMY N/A 05/27/2020  ? Procedure: MEDIAN STERNOTOMY;  Surgeon: Grace Isaac, MD;  Location: [facility];  Service: Open Heart Surgery;  Laterality: N/A;  Redo Median Sternotomy  ? RADIAL ARTERY HARVEST Left 05/27/2020  ? Procedure: RADIAL ARTERY HARVEST;  Surgeon: Grace Isaac, MD;  Location: [facility];  Service: Open Heart Surgery;  Laterality: Left;  ? TEE WITHOUT CARDIOVERSION N/A 05/27/2020  ? Procedure: TRANSESOPHAGEAL ECHOCARDIOGRAM (TEE);  Surgeon: Grace Isaac, MD;  Location: [facility];  Service: Open Heart Surgery;  Laterality: N/A;  ? TOTAL HIP ARTHROPLASTY Right 04/19/2018  ? Procedure: RIGHT TOTAL HIP ARTHROPLASTY ANTERIOR APPROACH;  Surgeon: Gaynelle Arabian, MD;  [facility];  Service: Orthopedics;  Laterality: Right;  ? ? ?There were no vitals filed for this visit. ? ? Subjective Assessment - 03/16/22 0802   ? ? Subjective no  real differences. doing ex and stretches. f/u with Dr Maureen Ralphs Friday   ? Currently in Pain? Yes   ? Pain Score 3    ? Pain Location Hip   ? Pain Orientation Right   ? ?  ?  ? ?  ? ? ? ? ? ? ? ? ? ? ? ? ? ? ? ? ? ? ? ? [facility] Adult PT Treatment/Exercise - 03/16/22 0001   ? ?  ? Knee/Hip Exercises: Aerobic  ? Nustep L 6 6 min   LE only  ?  ? Knee/Hip Exercises: Standing  ? Walking with Sports Cord 30# laterally with 4 inch step up 4 x each then fwd 2 each leg   ? Other Standing Knee Exercises dead lift 6# 2 sets 10   ? Other Standing Knee Exercises black bar heel raises/ toe raise 15 x each   hip 4 way cable pulleys 10# 12 x each  ?  ?  Knee/Hip Exercises: Seated  ? Other Seated Knee/Hip Exercises 5# IR/ER 2 sets 10   ?  ? Knee/Hip Exercises: Supine  ? Other Supine Knee/Hip Exercises SLR hip flex over 3 inch 3 sets 5   ?  ? Knee/Hip Exercises: Sidelying  ? Hip ABduction Strengthening;Right;10 reps   5# then cirlces CW and CCW  ?  ? Manual Therapy  ? Manual Therapy Passive ROM;Soft tissue mobilization   ? Soft tissue mobilization ITB stripping   ? Passive ROM BIL LE and trunk   ? ?  ?  ? ?  ? ? ? ? ? ? ? ? ? ? ? ? PT Short Term Goals - 02/16/22 0832   ? ?  ? PT SHORT TERM GOAL #1  ? Title I with Initial HEP for flexiblity    ? Status Achieved   ? ?  ?  ? ?  ? ? ? ? PT Long Term Goals - 03/16/22 0844   ? ?  ? PT LONG TERM GOAL #1  ? Title I with final HEP   ? Baseline pt has returned to gym and has HEP   ? Status Achieved   ?  ? PT LONG TERM GOAL #2  ? Title Patient will stand and walk withR hip pain<3/10.   ? Baseline Reports hip pain strong enough to make him stop after a few minutes of wlking.   ? Status On-going   ?  ? PT LONG TERM GOAL #3  ? Title Patient able to descend stairs with a reciprocal gait pattern with UE support   ? Status Achieved   ?  ? PT LONG TERM GOAL #4  ? Title Increase B hip strength to 5/5   ? Baseline RT hip flex 4/5, abd and rotators 4+/5, all others and left 5/5   ? Status Partially Met   ? ?  ?  ? ?  ? ? ? ? ? ? ? ? Plan - 03/16/22 0847   ? ? Clinical Impression Statement pt returns from 3 weeks vacation- pt sttaes he has been strething doing ex,cycling and at gym. pt returns no real difference.MD follow up Friday. LE tightness is better, ITB tightness beterr but still present esp more ant/quad. tender over GT. MMT improving but weakness in RT hip flex and pain with prolonged SLS   ? PT Treatment/Interventions ADLs/Self Care Home Management;Gait training;Stair training;Therapeutic exercise;Therapeutic activities;Neuromuscular re-education;Patient/family education;Passive range of motion;Manual techniques   ? PT Next  Visit Plan write MD note   ? ?  ?  ? ?  ? ? ?  Patient will benefit from skilled therapeutic intervention in order to improve the following deficits and impairments:  Abnormal gait, Decreased strength, Decreased range of motion, Impaired flexibility, Postural dysfunction, Pain, Difficulty walking ? ?Visit Diagnosis: ?Pain in right hip ? ?Muscle weakness (generalized) ? ?Difficulty in walking, not elsewhere classified ? ? ? ? ?Problem List ?Patient Active Problem List  ? Diagnosis Date Noted  ? S/P CABG x 2 05/27/2020  ? DOE (dyspnea on exertion)   ? OA (osteoarthritis) of hip 04/19/2018  ? AAA (abdominal aortic aneurysm) without rupture ([place]) 01/19/2017  ? Pain in joint of left knee 01/19/2017  ? Episodic mood disorder ([place]) 08/01/2014  ? Personal history of colonic polyps 07/08/2014  ? Onychomycosis 05/14/2014  ? Hyperlipidemia 07/10/2007  ? ERECTILE DYSFUNCTION 07/10/2007  ? Essential hypertension 07/10/2007  ? Coronary artery disease involving native coronary artery of native heart without angina pectoris 07/10/2007  ? ? ?Mallarie Voorhies,ANGIE, PTA ?03/16/2022, 8:51 AM ? ?[facility] ?[address] ?[address] ?[place], [place], [address] ?Phone: [phone]   Fax:  [phone] ? ?Name: Dave Robles ?MRN: [id number] ?Date of Birth: Nov 08, 1946 ? ? ? ?

## 2022-03-18 ENCOUNTER — Ambulatory Visit: Payer: Medicare HMO | Admitting: Physical Therapy

## 2022-03-18 DIAGNOSIS — M6281 Muscle weakness (generalized): Secondary | ICD-10-CM | POA: Diagnosis not present

## 2022-03-18 DIAGNOSIS — M25551 Pain in right hip: Secondary | ICD-10-CM | POA: Diagnosis not present

## 2022-03-18 DIAGNOSIS — R262 Difficulty in walking, not elsewhere classified: Secondary | ICD-10-CM | POA: Diagnosis not present

## 2022-03-18 NOTE — Therapy (Signed)
[facility] ?[place] ?[address] ?[place], [place], [address] ?Phone: [phone]   Fax:  [phone] ? ?Physical Therapy Treatment ? ?Patient Details  ?Name: Dave Robles ?MRN: [id number] ?Date of Birth: May 28, 1946 ?Referring Provider (PT): Aluisio ? ? ?Encounter Date: 03/18/2022 ? ? PT End of Session - 03/18/22 0844   ? ? Visit Number 6   ? Number of Visits 16   ? Date for PT Re-Evaluation 04/08/22   ? PT Start Time 0800   ? PT Stop Time 0850   ? PT Time Calculation (min) 50 min   ? ?  ?  ? ?  ? ? ?Past Medical History:  ?Diagnosis Date  ? AAA (abdominal aortic aneurysm) ([place])   ? AAA (abdominal aortic aneurysm) without rupture ([place]) 01/19/2017  ? Adjustment disorder with depressed mood 03/04/2009  ? Arthritis   ? Colon polyps   ? CORONARY ARTERY DISEASE 07/10/2007  ? Coronary artery disease involving native coronary artery of native heart without angina pectoris 07/10/2007  ? Qualifier: Diagnosis of  By: Scherrie Gerlach    ? Depression   ? DOE (dyspnea on exertion)   ? Episodic mood disorder ([place]) 08/01/2014  ? ERECTILE DYSFUNCTION 07/10/2007  ? Extrinsic asthma, unspecified 09/10/2008  ? HYPERLIPIDEMIA 07/10/2007  ? HYPERTENSION 07/10/2007  ? Myocardial infarction [facility]) 2016  ? when had angioplasty- was told had a silent MI  ? OA (osteoarthritis) of hip 04/19/2018  ? Onychomycosis 05/14/2014  ? Pain in joint of left knee 01/19/2017  ? Personal history of colonic polyps 07/08/2014  ? 1997, 2001 8 mm transverse serrated polyp right colon 2010 07/08/2014 no polyps - repeat colon 2022     ? S/P CABG x 2 05/27/2020  ? TOBACCO USE 12/15/2009  ? ? ?Past Surgical History:  ?Procedure Laterality Date  ? COLONOSCOPY    ? CORONARY ANGIOPLASTY  12/2014  ? CORONARY ARTERY BYPASS GRAFT  11/15/89  ? 5 vessels  ? CORONARY ARTERY BYPASS GRAFT N/A 05/27/2020  ? Procedure: REDO CORONARY ARTERY BYPASS GRAFTING (CABG), ON PUMP, TIMES TWO, USING RIGHT INTERNAL MAMMARY ARTERY AND LEFT RADIAL ARTERY (OPEN);   Surgeon: Grace Isaac, MD;  Location: [place];  Service: Open Heart Surgery;  Laterality: N/A;  Left Radial Artery to PDA ?FREE RIMA to OM  ? HYDROCELE EXCISION / REPAIR  2011  ? INGUINAL HERNIA REPAIR Left 1985  ? LEFT HEART CATH AND CORS/GRAFTS ANGIOGRAPHY N/A 05/13/2020  ? Procedure: LEFT HEART CATH AND CORS/GRAFTS ANGIOGRAPHY;  Surgeon: Belva Crome, MD;  Location: [facility];  Service: Cardiovascular;  Laterality: N/A;  ? MEDIASTERNOTOMY N/A 05/27/2020  ? Procedure: MEDIAN STERNOTOMY;  Surgeon: Grace Isaac, MD;  Location: [facility] [place];  Service: Open Heart Surgery;  Laterality: N/A;  Redo Median Sternotomy  ? RADIAL ARTERY HARVEST Left 05/27/2020  ? Procedure: RADIAL ARTERY HARVEST;  Surgeon: Grace Isaac, MD;  Location: [facility];  Service: Open Heart Surgery;  Laterality: Left;  ? TEE WITHOUT CARDIOVERSION N/A 05/27/2020  ? Procedure: TRANSESOPHAGEAL ECHOCARDIOGRAM (TEE);  Surgeon: Grace Isaac, MD;  Location: [facility];  Service: Open Heart Surgery;  Laterality: N/A;  ? TOTAL HIP ARTHROPLASTY Right 04/19/2018  ? Procedure: RIGHT TOTAL HIP ARTHROPLASTY ANTERIOR APPROACH;  Surgeon: Gaynelle Arabian, MD;  [facility];  Service: Orthopedics;  Laterality: Right;  ? ? ?There were no vitals filed for this visit. ? ? Subjective Assessment - 03/18/22 0804   ? ? Subjective no  worse. no changes   ? Currently in Pain? Yes   ? Pain Score 3    ? Pain Location Hip   ? Pain Orientation Right   ? ?  ?  ? ?  ? ? ? ? ? ? ? ? ? ? ? ? ? ? ? ? ? ? ? ? [facility] Adult PT Treatment/Exercise - 03/18/22 0001   ? ?  ? Knee/Hip Exercises: Aerobic  ? Recumbent Bike L 4 5 min   ?  ? Knee/Hip Exercises: Standing  ? Walking with Sports Cord 40# laterally 4 x each way   running man fwd and laterally each leg  ? Other Standing Knee Exercises -   ? Other Standing Knee Exercises hip 4 way on cable pulley 10 # 12 x each   ?  ? Knee/Hip Exercises: Seated  ? Other Seated Knee/Hip Exercises 5# IR/ER 2 sets 15   ?  ? Knee/Hip Exercises:  Supine  ? Other Supine Knee/Hip Exercises SLR hip flex over 3 inch 2 sets 8   ?  ? Manual Therapy  ? Manual Therapy Passive ROM;Soft tissue mobilization   ? Soft tissue mobilization ITB stripping   ? Passive ROM BIL LE and trunk   ? ?  ?  ? ?  ? ? ? ? ? ? ? ? ? ? ? ? PT Short Term Goals - 02/16/22 0832   ? ?  ? PT SHORT TERM GOAL #1  ? Title I with Initial HEP for flexiblity    ? Status Achieved   ? ?  ?  ? ?  ? ? ? ? PT Long Term Goals - 03/18/22 0837   ? ?  ? PT LONG TERM GOAL #1  ? Title I with final HEP   ? Baseline pt has returned to gym and has HEP   ? Status Achieved   ?  ? PT LONG TERM GOAL #2  ? Title Patient will stand and walk withR hip pain<3/10.   ? Baseline Reports hip pain strong enough to make him stop after a few minutes of wlking.   ? Status On-going   ?  ? PT LONG TERM GOAL #3  ? Title Patient able to descend stairs with a reciprocal gait pattern with UE support   ? Status Achieved   ?  ? PT LONG TERM GOAL #4  ? Title Increase B hip strength to 5/5   ? Baseline RT hip flex 4/5, abd and rotators 4+/5, all others and left 5/5   ? Status Partially Met   ? ?  ?  ? ?  ? ? ? ? ? ? ? ? Plan - 03/18/22 0844   ? ? Clinical Impression Statement pt has had 6 PT visits that we have done strength, stretching, STW ,DN and ionto with minimal changes seen. pt has extensive HEP and is at gym with trainer. ITB a nd quad are very tender. weakness in hip abd and rotators 4/5. still pain with walking, lying on RT side and prolonged SLS on RT leg   ? PT Treatment/Interventions ADLs/Self Care Home Management;Gait training;Stair training;Therapeutic exercise;Therapeutic activities;Neuromuscular re-education;Patient/family education;Passive range of motion;Manual techniques   ? PT Next Visit Plan MD note sent with pt   ? ?  ?  ? ?  ? ? ?Patient will benefit from skilled therapeutic intervention in order to improve the following deficits and impairments:  Abnormal gait, Decreased strength, Decreased range of motion,  Impaired flexibility, Postural  dysfunction, Pain, Difficulty walking ? ?Visit Diagnosis: ?Pain in right hip ? ?Muscle weakness (generalized) ? ?Difficulty in walking, not elsewhere classified ? ? ? ? ?Problem List ?Patient Active Problem List  ? Diagnosis Date Noted  ? S/P CABG x 2 05/27/2020  ? DOE (dyspnea on exertion)   ? OA (osteoarthritis) of hip 04/19/2018  ? AAA (abdominal aortic aneurysm) without rupture ([place]) 01/19/2017  ? Pain in joint of left knee 01/19/2017  ? Episodic mood disorder ([place]) 08/01/2014  ? Personal history of colonic polyps 07/08/2014  ? Onychomycosis 05/14/2014  ? Hyperlipidemia 07/10/2007  ? ERECTILE DYSFUNCTION 07/10/2007  ? Essential hypertension 07/10/2007  ? Coronary artery disease involving native coronary artery of native heart without angina pectoris 07/10/2007  ? ? ?Aine Strycharz,ANGIE, PTA ?03/18/2022, 8:47 AM ? ?[facility] ?[place] ?[address] ?[place], [place], [address] ?Phone: [phone]   Fax:  [phone] ? ?Name: Dave Robles ?MRN: [id number] ?Date of Birth: 04-08-1946 ? ? ? ?

## 2022-03-19 DIAGNOSIS — M7061 Trochanteric bursitis, right hip: Secondary | ICD-10-CM | POA: Diagnosis not present

## 2022-03-23 ENCOUNTER — Ambulatory Visit: Payer: Medicare HMO | Admitting: Physical Therapy

## 2022-03-23 DIAGNOSIS — M25551 Pain in right hip: Secondary | ICD-10-CM

## 2022-03-23 DIAGNOSIS — M6281 Muscle weakness (generalized): Secondary | ICD-10-CM

## 2022-03-23 DIAGNOSIS — R262 Difficulty in walking, not elsewhere classified: Secondary | ICD-10-CM | POA: Diagnosis not present

## 2022-03-23 NOTE — Therapy (Signed)
[facility] ?[place] ?[address] ?[place], [place], [address] ?Phone: [phone]   Fax:  [phone] ? ?Physical Therapy Treatment ? ?Patient Details  ?Name: Dave Robles ?MRN: [id number] ?Date of Birth: [birth year] ?Referring Provider (PT): Aluisio ? ? ?Encounter Date: 03/23/2022 ? ? PT End of Session - 03/23/22 9470   ? ? Visit Number 7   ? Number of Visits 16   ? Date for PT Re-Evaluation 04/08/22   ? PT Start Time 0800   ? PT Stop Time 0845   ? PT Time Calculation (min) 45 min   ? ?  ?  ? ?  ? ? ?Past Medical History:  ?Diagnosis Date  ? AAA (abdominal aortic aneurysm) ([place])   ? AAA (abdominal aortic aneurysm) without rupture ([place]) 01/19/2017  ? Adjustment disorder with depressed mood 03/04/2009  ? Arthritis   ? Colon polyps   ? CORONARY ARTERY DISEASE 07/10/2007  ? Coronary artery disease involving native coronary artery of native heart without angina pectoris 07/10/2007  ? Qualifier: Diagnosis of  By: Scherrie Gerlach    ? Depression   ? DOE (dyspnea on exertion)   ? Episodic mood disorder ([place]) 08/01/2014  ? ERECTILE DYSFUNCTION 07/10/2007  ? Extrinsic asthma, unspecified 09/10/2008  ? HYPERLIPIDEMIA 07/10/2007  ? HYPERTENSION 07/10/2007  ? Myocardial infarction [facility]) 2016  ? when had angioplasty- was told had a silent MI  ? OA (osteoarthritis) of hip 04/19/2018  ? Onychomycosis 05/14/2014  ? Pain in joint of left knee 01/19/2017  ? Personal history of colonic polyps 07/08/2014  ? 1997, 2001 8 mm transverse serrated polyp right colon 2010 07/08/2014 no polyps - repeat colon 2022     ? S/P CABG x 2 05/27/2020  ? TOBACCO USE 12/15/2009  ? ? ?Past Surgical History:  ?Procedure Laterality Date  ? COLONOSCOPY    ? CORONARY ANGIOPLASTY  12/2014  ? CORONARY ARTERY BYPASS GRAFT  11/15/89  ? 5 vessels  ? CORONARY ARTERY BYPASS GRAFT N/A 05/27/2020  ? Procedure: REDO CORONARY ARTERY BYPASS GRAFTING (CABG), ON PUMP, TIMES TWO, USING RIGHT INTERNAL MAMMARY ARTERY AND LEFT RADIAL ARTERY (OPEN);   Surgeon: Grace Isaac, MD;  Location: [facility];  Service: Open Heart Surgery;  Laterality: N/A;  Left Radial Artery to PDA ?FREE RIMA to OM  ? HYDROCELE EXCISION / REPAIR  2011  ? INGUINAL HERNIA REPAIR Left 1985  ? LEFT HEART CATH AND CORS/GRAFTS ANGIOGRAPHY N/A 05/13/2020  ? Procedure: LEFT HEART CATH AND CORS/GRAFTS ANGIOGRAPHY;  Surgeon: Belva Crome, MD;  Location: [facility];  Service: Cardiovascular;  Laterality: N/A;  ? MEDIASTERNOTOMY N/A 05/27/2020  ? Procedure: MEDIAN STERNOTOMY;  Surgeon: Grace Isaac, MD;  Location: [facility];  Service: Open Heart Surgery;  Laterality: N/A;  Redo Median Sternotomy  ? RADIAL ARTERY HARVEST Left 05/27/2020  ? Procedure: RADIAL ARTERY HARVEST;  Surgeon: Grace Isaac, MD;  Location: [facility];  Service: Open Heart Surgery;  Laterality: Left;  ? TEE WITHOUT CARDIOVERSION N/A 05/27/2020  ? Procedure: TRANSESOPHAGEAL ECHOCARDIOGRAM (TEE);  Surgeon: Grace Isaac, MD;  Location: [facility];  Service: Open Heart Surgery;  Laterality: N/A;  ? TOTAL HIP ARTHROPLASTY Right 04/19/2018  ? Procedure: RIGHT TOTAL HIP ARTHROPLASTY ANTERIOR APPROACH;  Surgeon: Gaynelle Arabian, MD;  [facility];  Service: Orthopedics;  Laterality: Right;  ? ? ?There were no vitals filed for this visit. ? ? Subjective Assessment - 03/23/22 0803   ? ? Subjective saw  PA- no new info . offered injection but opted to continue with PT   ? Pain Score 2    ? Pain Location Hip   ? Pain Orientation Right   ? ?  ?  ? ?  ? ? ? ? ? ? ? ? ? ? ? ? ? ? ? ? ? ? ? ? [facility] Adult PT Treatment/Exercise - 03/23/22 0001   ? ?  ? Knee/Hip Exercises: Aerobic  ? Elliptical L 3 3 min fwd/ 3 min backward   TM Sideways 1.5 min each way .44mh  ?  ? Knee/Hip Exercises: Standing  ? Lateral Step Up Both;10 reps   ADD step up  ? Forward Step Up Both;5 reps   4 in with airex ( total 6 in). 6 inch step down 10 each with 1 UE support  ? Other Standing Knee Exercises hip 4 way on cable pulley 10 # 12 x each   ?  ? Knee/Hip  Exercises: Seated  ? Other Seated Knee/Hip Exercises 5# IR/ER 15x   ?  ? Manual Therapy  ? Manual Therapy Passive ROM;Soft tissue mobilization   ? Soft tissue mobilization ITB stripping and quad   ? Passive ROM BIL LE and trunk   ? ?  ?  ? ?  ? ? ? ? ? ? ? ? ? ? ? ? PT Short Term Goals - 02/16/22 0832   ? ?  ? PT SHORT TERM GOAL #1  ? Title I with Initial HEP for flexiblity    ? Status Achieved   ? ?  ?  ? ?  ? ? ? ? PT Long Term Goals - 03/18/22 0837   ? ?  ? PT LONG TERM GOAL #1  ? Title I with final HEP   ? Baseline pt has returned to gym and has HEP   ? Status Achieved   ?  ? PT LONG TERM GOAL #2  ? Title Patient will stand and walk withR hip pain<3/10.   ? Baseline Reports hip pain strong enough to make him stop after a few minutes of wlking.   ? Status On-going   ?  ? PT LONG TERM GOAL #3  ? Title Patient able to descend stairs with a reciprocal gait pattern with UE support   ? Status Achieved   ?  ? PT LONG TERM GOAL #4  ? Title Increase B hip strength to 5/5   ? Baseline RT hip flex 4/5, abd and rotators 4+/5, all others and left 5/5   ? Status Partially Met   ? ?  ?  ? ?  ? ? ? ? ? ? ? ? Plan - 03/23/22 0804   ? ? Clinical Impression Statement pt opted to continue PT vs cortisone inject. will decrease freq starting next week to 1 x a week and will reassess at end of ert period. progressed ther ex and stretching ( tender and tight ITB and lateral quad)   ? PT Treatment/Interventions ADLs/Self Care Home Management;Gait training;Stair training;Therapeutic exercise;Therapeutic activities;Neuromuscular re-education;Patient/family education;Passive range of motion;Manual techniques   ? PT Next Visit Plan assess goals   ? ?  ?  ? ?  ? ? ?Patient will benefit from skilled therapeutic intervention in order to improve the following deficits and impairments:  Abnormal gait, Decreased strength, Decreased range of motion, Impaired flexibility, Postural dysfunction, Pain, Difficulty walking ? ?Visit Diagnosis: ?No  diagnosis found. ? ? ? ? ?Problem List ?Patient Active Problem List  ?  Diagnosis Date Noted  ? S/P CABG x 2 05/27/2020  ? DOE (dyspnea on exertion)   ? OA (osteoarthritis) of hip 04/19/2018  ? AAA (abdominal aortic aneurysm) without rupture ([place]) 01/19/2017  ? Pain in joint of left knee 01/19/2017  ? Episodic mood disorder ([place]) 08/01/2014  ? Personal history of colonic polyps 07/08/2014  ? Onychomycosis 05/14/2014  ? Hyperlipidemia 07/10/2007  ? ERECTILE DYSFUNCTION 07/10/2007  ? Essential hypertension 07/10/2007  ? Coronary artery disease involving native coronary artery of native heart without angina pectoris 07/10/2007  ? ? ?Jacquelyn Antony,ANGIE, PTA ?03/23/2022, 8:29 AM ? ?[facility] [place] [facility] ?[address] ?[address] ?[place], [place], [address] ?Phone: [phone]   Fax:  [phone] ? ?Name: Dave Robles ?MRN: [id number] ?Date of Birth: Mar 31, 1946 ? ? ? ?

## 2022-03-25 ENCOUNTER — Ambulatory Visit: Payer: Medicare HMO | Admitting: Physical Therapy

## 2022-03-25 DIAGNOSIS — R262 Difficulty in walking, not elsewhere classified: Secondary | ICD-10-CM

## 2022-03-25 DIAGNOSIS — M6281 Muscle weakness (generalized): Secondary | ICD-10-CM

## 2022-03-25 DIAGNOSIS — M25551 Pain in right hip: Secondary | ICD-10-CM

## 2022-03-25 NOTE — Therapy (Signed)
[facility] ?[place] ?[address] ?[place], [place], [address] ?Phone: [phone]   Fax:  [phone] ? ?Physical Therapy Treatment ? ?Patient Details  ?Name: Dave Robles ?MRN: [id number] ?Date of Birth: 06/04/1946 ?Referring Provider (PT): Aluisio ? ? ?Encounter Date: 03/25/2022 ? ? PT End of Session - 03/25/22 0830   ? ? Visit Number 8   ? Date for PT Re-Evaluation 04/08/22   ? PT Start Time 0800   ? PT Stop Time 0845   ? PT Time Calculation (min) 45 min   ? ?  ?  ? ?  ? ? ?Past Medical History:  ?Diagnosis Date  ? AAA (abdominal aortic aneurysm) ([place])   ? AAA (abdominal aortic aneurysm) without rupture ([place]) 01/19/2017  ? Adjustment disorder with depressed mood 03/04/2009  ? Arthritis   ? Colon polyps   ? CORONARY ARTERY DISEASE 07/10/2007  ? Coronary artery disease involving native coronary artery of native heart without angina pectoris 07/10/2007  ? Qualifier: Diagnosis of  By: Scherrie Gerlach    ? Depression   ? DOE (dyspnea on exertion)   ? Episodic mood disorder ([place]) 08/01/2014  ? ERECTILE DYSFUNCTION 07/10/2007  ? Extrinsic asthma, unspecified 09/10/2008  ? HYPERLIPIDEMIA 07/10/2007  ? HYPERTENSION 07/10/2007  ? Myocardial infarction [facility]) 2016  ? when had angioplasty- was told had a silent MI  ? OA (osteoarthritis) of hip 04/19/2018  ? Onychomycosis 05/14/2014  ? Pain in joint of left knee 01/19/2017  ? Personal history of colonic polyps 07/08/2014  ? 1997, 2001 8 mm transverse serrated polyp right colon 2010 07/08/2014 no polyps - repeat colon 2022     ? S/P CABG x 2 05/27/2020  ? TOBACCO USE 12/15/2009  ? ? ?Past Surgical History:  ?Procedure Laterality Date  ? COLONOSCOPY    ? CORONARY ANGIOPLASTY  12/2014  ? CORONARY ARTERY BYPASS GRAFT  11/15/89  ? 5 vessels  ? CORONARY ARTERY BYPASS GRAFT N/A 05/27/2020  ? Procedure: REDO CORONARY ARTERY BYPASS GRAFTING (CABG), ON PUMP, TIMES TWO, USING RIGHT INTERNAL MAMMARY ARTERY AND LEFT RADIAL ARTERY (OPEN);  Surgeon: Grace Isaac, MD;  Location: [facility];  Service: Open Heart Surgery;  Laterality: N/A;  Left Radial Artery to PDA ?FREE RIMA to OM  ? HYDROCELE EXCISION / REPAIR  2011  ? INGUINAL HERNIA REPAIR Left 1985  ? LEFT HEART CATH AND CORS/GRAFTS ANGIOGRAPHY N/A 05/13/2020  ? Procedure: LEFT HEART CATH AND CORS/GRAFTS ANGIOGRAPHY;  Surgeon: Belva Crome, MD;  Location: [facility];  Service: Cardiovascular;  Laterality: N/A;  ? MEDIASTERNOTOMY N/A 05/27/2020  ? Procedure: MEDIAN STERNOTOMY;  Surgeon: Grace Isaac, MD;  Location: [facility];  Service: Open Heart Surgery;  Laterality: N/A;  Redo Median Sternotomy  ? RADIAL ARTERY HARVEST Left 05/27/2020  ? Procedure: RADIAL ARTERY HARVEST;  Surgeon: Grace Isaac, MD;  Location: [facility];  Service: Open Heart Surgery;  Laterality: Left;  ? TEE WITHOUT CARDIOVERSION N/A 05/27/2020  ? Procedure: TRANSESOPHAGEAL ECHOCARDIOGRAM (TEE);  Surgeon: Grace Isaac, MD;  Location: [facility];  Service: Open Heart Surgery;  Laterality: N/A;  ? TOTAL HIP ARTHROPLASTY Right 04/19/2018  ? Procedure: RIGHT TOTAL HIP ARTHROPLASTY ANTERIOR APPROACH;  Surgeon: Gaynelle Arabian, MD;  [facility];  Service: Orthopedics;  Laterality: Right;  ? ? ?There were no vitals filed for this visit. ? ? Subjective Assessment - 03/25/22 0759   ? ? Subjective doing okay   ? Currently in Pain?  No/denies   ? ?  ?  ? ?  ? ? ? ? ? ? ? ? ? ? ? ? ? ? ? ? ? ? ? ? [facility] Adult PT Treatment/Exercise - 03/25/22 0001   ? ?  ? Knee/Hip Exercises: Aerobic  ? Elliptical L 3 3 min fwd/ 3 min backward   ?  ? Knee/Hip Exercises: Machines for Strengthening  ? Cybex Leg Press 60 # feet 3 way 10 x each   SL 30 # 10 each  ?  ? Knee/Hip Exercises: Standing  ? Walking with Sports Cord 40# 5 x 4 way   ? Other Standing Knee Exercises fitter 3 way 15 x BIL   ?  ? Knee/Hip Exercises: Supine  ? Bridges with Greig Right Strengthening;Both;15 reps   ? Bridges with Clamshell Strengthening;Both;15 reps   green tband  ? Other Supine Knee/Hip  Exercises SLR with hip abd 2 set 10 green tband   ?  ? Manual Therapy  ? Manual Therapy Passive ROM;Soft tissue mobilization   ? Soft tissue mobilization ITB stripping and quad   ? Passive ROM BIL LE and trunk   ? ?  ?  ? ?  ? ? ? ? ? ? ? ? ? ? ? ? PT Short Term Goals - 02/16/22 0832   ? ?  ? PT SHORT TERM GOAL #1  ? Title I with Initial HEP for flexiblity    ? Status Achieved   ? ?  ?  ? ?  ? ? ? ? PT Long Term Goals - 03/25/22 0800   ? ?  ? PT LONG TERM GOAL #1  ? Title I with final HEP   ? Status Achieved   ?  ? PT LONG TERM GOAL #2  ? Title Patient will stand and walk withR hip pain<3/10.   ? Status Partially Met   ?  ? PT LONG TERM GOAL #3  ? Title Patient able to descend stairs with a reciprocal gait pattern with UE support   ? Status Achieved   ?  ? PT LONG TERM GOAL #4  ? Title Increase B hip strength to 5/5   ? Baseline RT hip flex 4/5, abd and rotators 4+/5, all others and left 5/5   ? Status Partially Met   ? ?  ?  ? ?  ? ? ? ? ? ? ? ? Plan - 03/25/22 0830   ? ? Clinical Impression Statement progressing with ex and continuing to work on strength. pt continues wo be tight and tender in IT and lateral quad. pt diligent in gym with ex still limited by walking. progressing with goals   ? PT Treatment/Interventions ADLs/Self Care Home Management;Gait training;Stair training;Therapeutic exercise;Therapeutic activities;Neuromuscular re-education;Patient/family education;Passive range of motion;Manual techniques   ? PT Next Visit Plan progress, freq decreased to 1 x a week as pt is actvie in gym   ? ?  ?  ? ?  ? ? ?Patient will benefit from skilled therapeutic intervention in order to improve the following deficits and impairments:  Abnormal gait, Decreased strength, Decreased range of motion, Impaired flexibility, Postural dysfunction, Pain, Difficulty walking ? ?Visit Diagnosis: ?Pain in right hip ? ?Muscle weakness (generalized) ? ?Difficulty in walking, not elsewhere classified ? ? ? ? ?Problem List ?Patient  Active Problem List  ? Diagnosis Date Noted  ? S/P CABG x 2 05/27/2020  ? DOE (dyspnea on exertion)   ? OA (osteoarthritis) of hip 04/19/2018  ?  AAA (abdominal aortic aneurysm) without rupture ([place]) 01/19/2017  ? Pain in joint of left knee 01/19/2017  ? Episodic mood disorder ([place]) 08/01/2014  ? Personal history of colonic polyps 07/08/2014  ? Onychomycosis 05/14/2014  ? Hyperlipidemia 07/10/2007  ? ERECTILE DYSFUNCTION 07/10/2007  ? Essential hypertension 07/10/2007  ? Coronary artery disease involving native coronary artery of native heart without angina pectoris 07/10/2007  ? ? ?Mcclellan Demarais,ANGIE, PTA ?03/25/2022, 8:33 AM ? ?[facility] ?[address] ?[address] ?[place], [place], [address] ?Phone: [phone]   Fax:  [phone] ? ?Name: Dave Robles ?MRN: [id number] ?Date of Birth: November 16, 1945 ? ? ? ?

## 2022-03-30 ENCOUNTER — Ambulatory Visit: Payer: Medicare HMO | Admitting: Physical Therapy

## 2022-04-01 ENCOUNTER — Ambulatory Visit: Payer: Medicare HMO | Admitting: Physical Therapy

## 2022-04-01 DIAGNOSIS — M6281 Muscle weakness (generalized): Secondary | ICD-10-CM

## 2022-04-01 DIAGNOSIS — M25551 Pain in right hip: Secondary | ICD-10-CM | POA: Diagnosis not present

## 2022-04-01 DIAGNOSIS — R262 Difficulty in walking, not elsewhere classified: Secondary | ICD-10-CM | POA: Diagnosis not present

## 2022-04-01 NOTE — Therapy (Signed)
[facility]. [place], [place], [address] Phone: [phone]   Fax:  [phone]  Physical Therapy Treatment  Patient Details  Name: Dave Robles MRN: [id number] Date of Birth: August 07, 1946 Referring Provider (PT): Aluisio   Encounter Date: 04/01/2022   PT End of Session - 04/01/22 0843     Visit Number 9    Number of Visits 16    Date for PT Re-Evaluation 04/08/22    PT Start Time 0800    PT Stop Time 0845    PT Time Calculation (min) 45 min             Past Medical History:  Diagnosis Date   AAA (abdominal aortic aneurysm) ([place])    AAA (abdominal aortic aneurysm) without rupture ([place]) 01/19/2017   Adjustment disorder with depressed mood 03/04/2009   Arthritis    Colon polyps    CORONARY ARTERY DISEASE 07/10/2007   Coronary artery disease involving native coronary artery of native heart without angina pectoris 07/10/2007   Qualifier: Diagnosis of  By: Sherren Mocha, RN, Dorian Pod     Depression    DOE (dyspnea on exertion)    Episodic mood disorder ([place]) 08/01/2014   ERECTILE DYSFUNCTION 07/10/2007   Extrinsic asthma, unspecified 09/10/2008   HYPERLIPIDEMIA 07/10/2007   HYPERTENSION 07/10/2007   Myocardial infarction [facility]) 2016   when had angioplasty- was told had a silent MI   OA (osteoarthritis) of hip 04/19/2018   Onychomycosis 05/14/2014   Pain in joint of left knee 01/19/2017   Personal history of colonic polyps 07/08/2014   1997, 2001 8 mm transverse serrated polyp right colon 2010 07/08/2014 no polyps - repeat colon 2022      S/P CABG x 2 05/27/2020   TOBACCO USE 12/15/2009    Past Surgical History:  Procedure Laterality Date   COLONOSCOPY     CORONARY ANGIOPLASTY  12/2014   CORONARY ARTERY BYPASS GRAFT  11/15/89   5 vessels   CORONARY ARTERY BYPASS GRAFT N/A 05/27/2020   Procedure: REDO CORONARY ARTERY BYPASS GRAFTING (CABG), ON PUMP, TIMES TWO, USING RIGHT INTERNAL MAMMARY ARTERY AND LEFT RADIAL ARTERY (OPEN);   Surgeon: Grace Isaac, MD;  Location: [facility];  Service: Open Heart Surgery;  Laterality: N/A;  Left Radial Artery to PDA FREE RIMA to OM   HYDROCELE EXCISION / REPAIR  2011   INGUINAL HERNIA REPAIR Left 1985   LEFT HEART CATH AND CORS/GRAFTS ANGIOGRAPHY N/A 05/13/2020   Procedure: LEFT HEART CATH AND CORS/GRAFTS ANGIOGRAPHY;  Surgeon: Belva Crome, MD;  Location: [place] [facility];  Service: Cardiovascular;  Laterality: N/A;   MEDIASTERNOTOMY N/A 05/27/2020   Procedure: MEDIAN STERNOTOMY;  Surgeon: Grace Isaac, MD;  Location: [facility];  Service: Open Heart Surgery;  Laterality: N/A;  Redo Median Sternotomy   RADIAL ARTERY HARVEST Left 05/27/2020   Procedure: RADIAL ARTERY HARVEST;  Surgeon: Grace Isaac, MD;  Location: [facility];  Service: Open Heart Surgery;  Laterality: Left;   TEE WITHOUT CARDIOVERSION N/A 05/27/2020   Procedure: TRANSESOPHAGEAL ECHOCARDIOGRAM (TEE);  Surgeon: Grace Isaac, MD;  Location: [facility];  Service: Open Heart Surgery;  Laterality: N/A;   TOTAL HIP ARTHROPLASTY Right 04/19/2018   Procedure: RIGHT TOTAL HIP ARTHROPLASTY ANTERIOR APPROACH;  Surgeon: Gaynelle Arabian, MD;  [facility];  Service: Orthopedics;  Laterality: Right;    There were no vitals filed for this visit.   Subjective Assessment - 04/01/22 0801     Subjective stronger,  pain better. walking pain comes but warms up quickly    Currently in Pain? No/denies                               [facility] Adult PT Treatment/Exercise - 04/01/22 0001       Knee/Hip Exercises: Aerobic   Elliptical L 3 3 min fwd/ 3 min backward   TM OFF push/pull 15 x each leg. SW 90 sec each way     Knee/Hip Exercises: Standing   Lateral Step Up Both;10 reps;Hand Hold: 0;Step Height: 4"   on airex SBA   Walking with Sports Cord 40# 5 x each side    Other Standing Knee Exercises curtsity lunges 20 x, 5# mod dead lift 15 x      Knee/Hip Exercises: Supine   Bridges with Ball Squeeze  Strengthening;Both;15 reps    Bridges with Clamshell Strengthening;Both;15 reps   green tband   Other Supine Knee/Hip Exercises SLR with hip abd 2 set 10 green tband      Manual Therapy   Manual Therapy Passive ROM;Soft tissue mobilization    Soft tissue mobilization ITB stripping and quad    Passive ROM BIL LE and trunk                       PT Short Term Goals - 02/16/22 5638       PT SHORT TERM GOAL #1   Title I with Initial HEP for flexiblity     Status Achieved               PT Long Term Goals - 04/01/22 0842       PT LONG TERM GOAL #4   Title Increase B hip strength to 5/5    Status Partially Met                   Plan - 04/01/22 0843     Clinical Impression Statement progressed ther ex with muscle isolation and PROM with self piriformis stretch    PT Treatment/Interventions ADLs/Self Care Home Management;Gait training;Stair training;Therapeutic exercise;Therapeutic activities;Neuromuscular re-education;Patient/family education;Passive range of motion;Manual techniques    PT Next Visit Plan recert             Patient will benefit from skilled therapeutic intervention in order to improve the following deficits and impairments:  Abnormal gait, Decreased strength, Decreased range of motion, Impaired flexibility, Postural dysfunction, Pain, Difficulty walking  Visit Diagnosis: Pain in right hip  Muscle weakness (generalized)     Problem List Patient Active Problem List   Diagnosis Date Noted   S/P CABG x 2 05/27/2020   DOE (dyspnea on exertion)    OA (osteoarthritis) of hip 04/19/2018   AAA (abdominal aortic aneurysm) without rupture ([place]) 01/19/2017   Pain in joint of left knee 01/19/2017   Episodic mood disorder ([place]) 08/01/2014   Personal history of colonic polyps 07/08/2014   Onychomycosis 05/14/2014   Hyperlipidemia 07/10/2007   ERECTILE DYSFUNCTION 07/10/2007   Essential hypertension 07/10/2007   Coronary artery  disease involving native coronary artery of native heart without angina pectoris 07/10/2007    Josue Falconi,ANGIE, PTA 04/01/2022, 8:45 AM  [facility]. [place], [place], [address] Phone: [phone]   Fax:  [phone]  Name: Dave Robles MRN: [id number] Date of Birth: 29-Jul-1946

## 2022-04-06 ENCOUNTER — Ambulatory Visit: Payer: Medicare HMO | Admitting: Physical Therapy

## 2022-04-06 DIAGNOSIS — R262 Difficulty in walking, not elsewhere classified: Secondary | ICD-10-CM

## 2022-04-06 DIAGNOSIS — M6281 Muscle weakness (generalized): Secondary | ICD-10-CM | POA: Diagnosis not present

## 2022-04-06 DIAGNOSIS — M25551 Pain in right hip: Secondary | ICD-10-CM | POA: Diagnosis not present

## 2022-04-06 NOTE — Therapy (Signed)
[facility]. [place], [place], [address] Phone: [phone]   Fax:  [phone] Progress Note Reporting Period 02/11/22 to 04/06/22  See note below for Objective Data and Assessment of Progress/Goals.     Physical Therapy Treatment  Patient Details  Name: Dave Robles MRN: [id number] Date of Birth: December 03, 1945 Referring Provider (PT): Aluisio   Encounter Date: 04/06/2022   PT End of Session - 04/06/22 0819     Visit Number 10    Number of Visits 16    Date for PT Re-Evaluation 04/08/22    PT Start Time 0800    PT Stop Time 0845    PT Time Calculation (min) 45 min             Past Medical History:  Diagnosis Date   AAA (abdominal aortic aneurysm) ([place])    AAA (abdominal aortic aneurysm) without rupture ([place]) 01/19/2017   Adjustment disorder with depressed mood 03/04/2009   Arthritis    Colon polyps    CORONARY ARTERY DISEASE 07/10/2007   Coronary artery disease involving native coronary artery of native heart without angina pectoris 07/10/2007   Qualifier: Diagnosis of  By: Sherren Mocha, RN, Dorian Pod     Depression    DOE (dyspnea on exertion)    Episodic mood disorder ([place]) 08/01/2014   ERECTILE DYSFUNCTION 07/10/2007   Extrinsic asthma, unspecified 09/10/2008   HYPERLIPIDEMIA 07/10/2007   HYPERTENSION 07/10/2007   Myocardial infarction ([place]) 2016   when had angioplasty- was told had a silent MI   OA (osteoarthritis) of hip 04/19/2018   Onychomycosis 05/14/2014   Pain in joint of left knee 01/19/2017   Personal history of colonic polyps 07/08/2014   1997, 2001 8 mm transverse serrated polyp right colon 2010 07/08/2014 no polyps - repeat colon 2022      S/P CABG x 2 05/27/2020   TOBACCO USE 12/15/2009    Past Surgical History:  Procedure Laterality Date   COLONOSCOPY     CORONARY ANGIOPLASTY  12/2014   CORONARY ARTERY BYPASS GRAFT  11/15/89   5 vessels   CORONARY ARTERY BYPASS GRAFT N/A 05/27/2020   Procedure: REDO  CORONARY ARTERY BYPASS GRAFTING (CABG), ON PUMP, TIMES TWO, USING RIGHT INTERNAL MAMMARY ARTERY AND LEFT RADIAL ARTERY (OPEN);  Surgeon: Grace Isaac, MD;  Location: [facility];  Service: Open Heart Surgery;  Laterality: N/A;  Left Radial Artery to PDA FREE RIMA to OM   HYDROCELE EXCISION / REPAIR  2011   INGUINAL HERNIA REPAIR Left 1985   LEFT HEART CATH AND CORS/GRAFTS ANGIOGRAPHY N/A 05/13/2020   Procedure: LEFT HEART CATH AND CORS/GRAFTS ANGIOGRAPHY;  Surgeon: Belva Crome, MD;  Location: [facility];  Service: Cardiovascular;  Laterality: N/A;   MEDIASTERNOTOMY N/A 05/27/2020   Procedure: MEDIAN STERNOTOMY;  Surgeon: Grace Isaac, MD;  Location: [facility];  Service: Open Heart Surgery;  Laterality: N/A;  Redo Median Sternotomy   RADIAL ARTERY HARVEST Left 05/27/2020   Procedure: RADIAL ARTERY HARVEST;  Surgeon: Grace Isaac, MD;  Location: [facility];  Service: Open Heart Surgery;  Laterality: Left;   TEE WITHOUT CARDIOVERSION N/A 05/27/2020   Procedure: TRANSESOPHAGEAL ECHOCARDIOGRAM (TEE);  Surgeon: Grace Isaac, MD;  Location: [facility];  Service: Open Heart Surgery;  Laterality: N/A;   TOTAL HIP ARTHROPLASTY Right 04/19/2018   Procedure: RIGHT TOTAL HIP ARTHROPLASTY ANTERIOR APPROACH;  Surgeon: Gaynelle Arabian, MD;  [facility];  Service: Orthopedics;  Laterality: Right;  There were no vitals filed for this visit.   Subjective Assessment - 04/06/22 0804     Subjective 50% better overall , gym ex getting better so I guess getting stronger. need to try so longer walking    Currently in Pain? No/denies                               [facility] Adult PT Treatment/Exercise - 04/06/22 0001       Knee/Hip Exercises: Aerobic   Elliptical L 3 3 min fwd/ 3 min backward      Knee/Hip Exercises: Standing   Walking with Sports Cord 40# with 6 inch step up 3 x fwd each leg thn 3 x each side with lateral step up   6 inch step down 15 x   Other Standing Knee  Exercises green tband lateral side step with squat 12 x each way    Other Standing Knee Exercises green tband hip flex,ext and abd 15 x BIL      Knee/Hip Exercises: Seated   Sit to Sand 15 reps;without UE support   blue tband holding knees in abd     Knee/Hip Exercises: Supine   Bridges with Diona Foley Squeeze Strengthening;Both;15 reps    Bridges with Clamshell Strengthening;Both;15 reps   blue tband   Other Supine Knee/Hip Exercises SLR with hip abd 10x green tband   blue tband     Manual Therapy   Manual Therapy Passive ROM;Soft tissue mobilization    Soft tissue mobilization ITB stripping and quad    Passive ROM BIL LE and trunk                       PT Short Term Goals - 02/16/22 3888       PT SHORT TERM GOAL #1   Title I with Initial HEP for flexiblity     Status Achieved               PT Long Term Goals - 04/06/22 0817       PT LONG TERM GOAL #1   Title I with final HEP    Status Achieved      PT LONG TERM GOAL #2   Title Patient will stand and walk withR hip pain<3/10.    Baseline walking is still an issue    Status Partially Met      PT LONG TERM GOAL #3   Title Patient able to descend stairs with a reciprocal gait pattern with UE support    Status Achieved      PT LONG TERM GOAL #4   Title Increase B hip strength to 5/5    Baseline RT hip flex, abd and rotators 4+/5, all others and left 5/5    Status Partially Met                   Plan - 04/06/22 0819     Clinical Impression Statement pt arrives 50% better. improved strength and at gym with good ex program. pt is progressing with goals but has not tried long distance walking. decreased tightness in LE but still tenderness RT GT and ITB    PT Treatment/Interventions ADLs/Self Care Home Management;Gait training;Stair training;Therapeutic exercise;Therapeutic activities;Neuromuscular re-education;Patient/family education;Passive range of motion;Manual techniques    PT Next Visit Plan  HOLD 2-3 weeks as pt wants to see if he can continue in gym. if pt returns recert if not  D/C             Patient will benefit from skilled therapeutic intervention in order to improve the following deficits and impairments:  Abnormal gait, Decreased strength, Decreased range of motion, Impaired flexibility, Postural dysfunction, Pain, Difficulty walking  Visit Diagnosis: Pain in right hip  Muscle weakness (generalized)  Difficulty in walking, not elsewhere classified     Problem List Patient Active Problem List   Diagnosis Date Noted   S/P CABG x 2 05/27/2020   DOE (dyspnea on exertion)    OA (osteoarthritis) of hip 04/19/2018   AAA (abdominal aortic aneurysm) without rupture ([place]) 01/19/2017   Pain in joint of left knee 01/19/2017   Episodic mood disorder ([place]) 08/01/2014   Personal history of colonic polyps 07/08/2014   Onychomycosis 05/14/2014   Hyperlipidemia 07/10/2007   ERECTILE DYSFUNCTION 07/10/2007   Essential hypertension 07/10/2007   Coronary artery disease involving native coronary artery of native heart without angina pectoris 07/10/2007    Keonna Raether,ANGIE, PTA 04/06/2022, 8:25 AM  [place]. [place], [place], [address] Phone: [phone]   Fax:  [phone]  Name: Dave Robles MRN: [id number] Date of Birth: 1946/11/14

## 2022-04-23 DIAGNOSIS — H2513 Age-related nuclear cataract, bilateral: Secondary | ICD-10-CM | POA: Diagnosis not present

## 2022-04-23 DIAGNOSIS — H40053 Ocular hypertension, bilateral: Secondary | ICD-10-CM | POA: Diagnosis not present

## 2022-04-23 DIAGNOSIS — H401131 Primary open-angle glaucoma, bilateral, mild stage: Secondary | ICD-10-CM | POA: Diagnosis not present

## 2022-04-23 DIAGNOSIS — H35033 Hypertensive retinopathy, bilateral: Secondary | ICD-10-CM | POA: Diagnosis not present

## 2022-04-23 DIAGNOSIS — H43813 Vitreous degeneration, bilateral: Secondary | ICD-10-CM | POA: Diagnosis not present

## 2022-04-27 DIAGNOSIS — D1721 Benign lipomatous neoplasm of skin and subcutaneous tissue of right arm: Secondary | ICD-10-CM | POA: Diagnosis not present

## 2022-04-27 DIAGNOSIS — D1722 Benign lipomatous neoplasm of skin and subcutaneous tissue of left arm: Secondary | ICD-10-CM | POA: Diagnosis not present

## 2022-04-27 DIAGNOSIS — L578 Other skin changes due to chronic exposure to nonionizing radiation: Secondary | ICD-10-CM | POA: Diagnosis not present

## 2022-04-27 DIAGNOSIS — L821 Other seborrheic keratosis: Secondary | ICD-10-CM | POA: Diagnosis not present

## 2022-04-27 DIAGNOSIS — D0472 Carcinoma in situ of skin of left lower limb, including hip: Secondary | ICD-10-CM | POA: Diagnosis not present

## 2022-04-27 DIAGNOSIS — D225 Melanocytic nevi of trunk: Secondary | ICD-10-CM | POA: Diagnosis not present

## 2022-04-27 DIAGNOSIS — Z85828 Personal history of other malignant neoplasm of skin: Secondary | ICD-10-CM | POA: Diagnosis not present

## 2022-04-27 DIAGNOSIS — L309 Dermatitis, unspecified: Secondary | ICD-10-CM | POA: Diagnosis not present

## 2022-04-27 DIAGNOSIS — D485 Neoplasm of uncertain behavior of skin: Secondary | ICD-10-CM | POA: Diagnosis not present

## 2022-04-27 DIAGNOSIS — L570 Actinic keratosis: Secondary | ICD-10-CM | POA: Diagnosis not present

## 2022-04-27 DIAGNOSIS — C44622 Squamous cell carcinoma of skin of right upper limb, including shoulder: Secondary | ICD-10-CM | POA: Diagnosis not present

## 2022-04-29 ENCOUNTER — Ambulatory Visit: Payer: Medicare HMO | Attending: Orthopedic Surgery | Admitting: Physical Therapy

## 2022-04-29 DIAGNOSIS — M6281 Muscle weakness (generalized): Secondary | ICD-10-CM | POA: Diagnosis not present

## 2022-04-29 NOTE — Therapy (Addendum)
[facility] [place], [place], [address] Phone: [phone]   Fax:  [phone]  Physical Therapy Treatment  Patient Details  Name: Dave Robles MRN: [id number] Date of Birth: [birth year] Referring Provider (PT): Aluisio   Encounter Date: 04/29/2022   PT End of Session - 04/29/22 0827     Visit Number 11    PT Start Time 0800    PT Stop Time 0840    PT Time Calculation (min) 40 min             Past Medical History:  Diagnosis Date   AAA (abdominal aortic aneurysm) ([facility])    AAA (abdominal aortic aneurysm) without rupture ([facility]) 01/19/2017   Adjustment disorder with depressed mood 03/04/2009   Arthritis    Colon polyps    CORONARY ARTERY DISEASE 07/10/2007   Coronary artery disease involving native coronary artery of native heart without angina pectoris 07/10/2007   Qualifier: Diagnosis of  By: Sherren Mocha, RN, Dorian Pod     Depression    DOE (dyspnea on exertion)    Episodic mood disorder ([facility]) 08/01/2014   ERECTILE DYSFUNCTION 07/10/2007   Extrinsic asthma, unspecified 09/10/2008   HYPERLIPIDEMIA 07/10/2007   HYPERTENSION 07/10/2007   Myocardial infarction [facility]) 2016   when had angioplasty- was told had a silent MI   OA (osteoarthritis) of hip 04/19/2018   Onychomycosis 05/14/2014   Pain in joint of left knee 01/19/2017   Personal history of colonic polyps 07/08/2014   1997, 2001 8 mm transverse serrated polyp right colon 2010 07/08/2014 no polyps - repeat colon 2022      S/P CABG x 2 05/27/2020   TOBACCO USE 12/15/2009    Past Surgical History:  Procedure Laterality Date   COLONOSCOPY     CORONARY ANGIOPLASTY  12/2014   CORONARY ARTERY BYPASS GRAFT  11/15/89   5 vessels   CORONARY ARTERY BYPASS GRAFT N/A 05/27/2020   Procedure: REDO CORONARY ARTERY BYPASS GRAFTING (CABG), ON PUMP, TIMES TWO, USING RIGHT INTERNAL MAMMARY ARTERY AND LEFT RADIAL ARTERY (OPEN);  Surgeon: Grace Isaac, MD;  Location: [facility];  Service:  Open Heart Surgery;  Laterality: N/A;  Left Radial Artery to PDA FREE RIMA to OM   HYDROCELE EXCISION / REPAIR  2011   INGUINAL HERNIA REPAIR Left 1985   LEFT HEART CATH AND CORS/GRAFTS ANGIOGRAPHY N/A 05/13/2020   Procedure: LEFT HEART CATH AND CORS/GRAFTS ANGIOGRAPHY;  Surgeon: Belva Crome, MD;  Location: [facility];  Service: Cardiovascular;  Laterality: N/A;   MEDIASTERNOTOMY N/A 05/27/2020   Procedure: MEDIAN STERNOTOMY;  Surgeon: Grace Isaac, MD;  Location: [facility];  Service: Open Heart Surgery;  Laterality: N/A;  Redo Median Sternotomy   RADIAL ARTERY HARVEST Left 05/27/2020   Procedure: RADIAL ARTERY HARVEST;  Surgeon: Grace Isaac, MD;  Location: [facility];  Service: Open Heart Surgery;  Laterality: Left;   TEE WITHOUT CARDIOVERSION N/A 05/27/2020   Procedure: TRANSESOPHAGEAL ECHOCARDIOGRAM (TEE);  Surgeon: Grace Isaac, MD;  Location: [facility];  Service: Open Heart Surgery;  Laterality: N/A;   TOTAL HIP ARTHROPLASTY Right 04/19/2018   Procedure: RIGHT TOTAL HIP ARTHROPLASTY ANTERIOR APPROACH;  Surgeon: Gaynelle Arabian, MD;  [facility];  Service: Orthopedics;  Laterality: Right;    There were no vitals filed for this visit.   Subjective Assessment - 04/29/22 0759     Subjective doing very well, walking is 80% better. increased strength overall and at gym regularly  Currently in Pain? No/denies                               [facility] Adult PT Treatment/Exercise - 04/29/22 0001       Knee/Hip Exercises: Aerobic   Elliptical L 3 3 min fwd/ 3 min backward      Knee/Hip Exercises: Machines for Strengthening   Cybex Leg Press SL 10 each 40#, 60# IR/ER 10 each      Knee/Hip Exercises: Standing   Other Standing Knee Exercises hip 4 way cable pulley 10# 15 x BIL      Knee/Hip Exercises: Prone   Other Prone Exercises quadreped 5# hip ex 10 each      Manual Therapy   Manual Therapy Passive ROM;Soft tissue mobilization    Soft tissue  mobilization ITB stripping and quad    Passive ROM BIL LE and trunk                     PT Education - 04/29/22 0817     Education Details viewed HEPS, stretching with strap and foam rolling    Person(s) Educated Patient    Methods Explanation;Demonstration    Comprehension Verbalized understanding;Returned demonstration;Verbal cues required              PT Short Term Goals - 02/16/22 4287       PT SHORT TERM GOAL #1   Title I with Initial HEP for flexiblity     Status Achieved               PT Long Term Goals - 04/29/22 0800       PT LONG TERM GOAL #1   Title I with final HEP    Status Achieved      PT LONG TERM GOAL #2   Title Patient will stand and walk withR hip pain<3/10.    Status Achieved      PT LONG TERM GOAL #3   Title Patient able to descend stairs with a reciprocal gait pattern with UE support    Status Achieved      PT LONG TERM GOAL #4   Title Increase B hip strength to 5/5    Status Achieved                   Plan - 04/29/22 0801     Clinical Impression Statement pt arrived after 3 weeks of being at gym and doing HEP and reports 80% better overall. improved strength and states walking better overall- still minimal issues. all goals met and feels good for D/C    PT Treatment/Interventions ADLs/Self Care Home Management;Gait training;Stair training;Therapeutic exercise;Therapeutic activities;Neuromuscular re-education;Patient/family education;Passive range of motion;Manual techniques    PT Next Visit Plan D/C             Patient will benefit from skilled therapeutic intervention in order to improve the following deficits and impairments:  Abnormal gait, Decreased strength, Decreased range of motion, Impaired flexibility, Postural dysfunction, Pain, Difficulty walking  Visit Diagnosis: Muscle weakness (generalized)     Problem List Patient Active Problem List   Diagnosis Date Noted   S/P CABG x 2 05/27/2020    DOE (dyspnea on exertion)    OA (osteoarthritis) of hip 04/19/2018   AAA (abdominal aortic aneurysm) without rupture ([place]) 01/19/2017   Pain in joint of left knee 01/19/2017   Episodic mood disorder ([facility] [place]) 08/01/2014   Personal history of  colonic polyps 07/08/2014   Onychomycosis 05/14/2014   Hyperlipidemia 07/10/2007   ERECTILE DYSFUNCTION 07/10/2007   Essential hypertension 07/10/2007   Coronary artery disease involving native coronary artery of native heart without angina pectoris 07/10/2007  PHYSICAL THERAPY DISCHARGE SUMMARY   Patient agrees to discharge. Patient goals were met. Patient is being discharged due to meeting the stated rehab goals.   Ethel Rana DPT 04/29/22 4:47 PM   Allessandra Bernardi,ANGIE, PTA 04/29/2022, 8:29 AM  [facility]. [place], [place], [address] Phone: [phone]   Fax:  [phone]  Name: MARQUON ALCALA MRN: [id number] Date of Birth: 06/08/1946

## 2022-05-03 DIAGNOSIS — Z0100 Encounter for examination of eyes and vision without abnormal findings: Secondary | ICD-10-CM | POA: Diagnosis not present

## 2022-05-06 NOTE — Progress Notes (Deleted)
Cardiology Office Note:    Date:  05/06/2022   ID:  Dave Robles, DOB Aug 22, 1946, MRN [id number]  PCP:  Dorothyann Peng, NP   [facility]  Cardiologist:  Freada Bergeron, MD  Advanced Practice Provider:  No care team member to display Electrophysiologist:  None    Referring MD: Dorothyann Peng, NP    History of Present Illness:    Dave Robles is a 76 y.o. male with a hx of CAD with original CABG x 3 with LIMA to LAD, SVG to DX and SVG to distal RCA in 1991 by Dr. Redmond Pulling, HTN, HLD, ED, past tobacco abuse, asthma, AAA (last measured in 10/2019 - 3.4 cm) and depression who presents to clinic for follow-up. Was previously followed by [name].   Per review of the record, the patient had a non-ST elevation myocardial infarction in 2016 while vacationing in [place].  Cath noted to have normal left main, 90% prox LAD, 99% mid LAD with patent LIMA to mid LAD and patent SVG to DX. LCX origin stenosis vs flow void? Fistula vs competitive flow in branch?, 100% RCA, SVG to RCA 99% origin treated with Xience DES and PTCA to the distal.   Patient has been closely followed by [name]. Was seen in 04/2020 with worsening dyspnea on exertion which is his anginal equivalent. Was referred for cardiac cath at that time which led to need for repeat CABG on 05/27/20. Specifically, he received a left radial artery to PDA, and a free right internal mammary artery to obtuse marginal.  Post-op course complicated by need for inotropic support and blood loss anemia requiring blood transfusion. Repeat TTE 03/04/21 with EF 60-65, no RWMA, mild LVH, GR 1 DD, normal RVSF, moderate LAE, mild AV sclerosis without stenosis.   Was last seen in clinic on 01/2022 where he was doing very well.  Active without anginal symptoms.   Today, ***  Past Medical History:  Diagnosis Date   AAA (abdominal aortic aneurysm) ([facility])    AAA (abdominal aortic aneurysm) without rupture ([place])  01/19/2017   Adjustment disorder with depressed mood 03/04/2009   Arthritis    Colon polyps    CORONARY ARTERY DISEASE 07/10/2007   Coronary artery disease involving native coronary artery of native heart without angina pectoris 07/10/2007   Qualifier: Diagnosis of  By: Sherren Mocha, RN, Dorian Pod     Depression    DOE (dyspnea on exertion)    Episodic mood disorder ([place]) 08/01/2014   ERECTILE DYSFUNCTION 07/10/2007   Extrinsic asthma, unspecified 09/10/2008   HYPERLIPIDEMIA 07/10/2007   HYPERTENSION 07/10/2007   Myocardial infarction ([place]) 2016   when had angioplasty- was told had a silent MI   OA (osteoarthritis) of hip 04/19/2018   Onychomycosis 05/14/2014   Pain in joint of left knee 01/19/2017   Personal history of colonic polyps 07/08/2014   1997, 2001 8 mm transverse serrated polyp right colon 2010 07/08/2014 no polyps - repeat colon 2022      S/P CABG x 2 05/27/2020   TOBACCO USE 12/15/2009    Past Surgical History:  Procedure Laterality Date   COLONOSCOPY     CORONARY ANGIOPLASTY  12/2014   CORONARY ARTERY BYPASS GRAFT  11/15/89   5 vessels   CORONARY ARTERY BYPASS GRAFT N/A 05/27/2020   Procedure: REDO CORONARY ARTERY BYPASS GRAFTING (CABG), ON PUMP, TIMES TWO, USING RIGHT INTERNAL MAMMARY ARTERY AND LEFT RADIAL ARTERY (OPEN);  Surgeon: Grace Isaac, MD;  Location: [facility];  Service: Open Heart Surgery;  Laterality: N/A;  Left Radial Artery to PDA FREE RIMA to OM   HYDROCELE EXCISION / REPAIR  2011   INGUINAL HERNIA REPAIR Left 1985   LEFT HEART CATH AND CORS/GRAFTS ANGIOGRAPHY N/A 05/13/2020   Procedure: LEFT HEART CATH AND CORS/GRAFTS ANGIOGRAPHY;  Surgeon: Belva Crome, MD;  Location: [facility];  Service: Cardiovascular;  Laterality: N/A;   MEDIASTERNOTOMY N/A 05/27/2020   Procedure: MEDIAN STERNOTOMY;  Surgeon: Grace Isaac, MD;  Location: [facility];  Service: Open Heart Surgery;  Laterality: N/A;  Redo Median Sternotomy   RADIAL ARTERY HARVEST Left 05/27/2020   Procedure: RADIAL  ARTERY HARVEST;  Surgeon: Grace Isaac, MD;  Location: [facility];  Service: Open Heart Surgery;  Laterality: Left;   TEE WITHOUT CARDIOVERSION N/A 05/27/2020   Procedure: TRANSESOPHAGEAL ECHOCARDIOGRAM (TEE);  Surgeon: Grace Isaac, MD;  Location: [facility];  Service: Open Heart Surgery;  Laterality: N/A;   TOTAL HIP ARTHROPLASTY Right 04/19/2018   Procedure: RIGHT TOTAL HIP ARTHROPLASTY ANTERIOR APPROACH;  Surgeon: Gaynelle Arabian, MD;  [facility];  Service: Orthopedics;  Laterality: Right;    Current Medications: No outpatient medications have been marked as taking for the 05/07/22 encounter (Appointment) with Freada Bergeron, MD.     Allergies:   Patient has no known allergies.   Social History   Socioeconomic History   Marital status: Married    Spouse name: Not on file   Number of children: Not on file   Years of education: Not on file   Highest education level: Not on file  Occupational History   Not on file  Tobacco Use   Smoking status: Former    Packs/day: 0.50    Types: Cigarettes    Quit date: 1991    Years since quitting: 32.4   Smokeless tobacco: Never  Vaping Use   Vaping Use: Never used  Substance and Sexual Activity   Alcohol use: Yes    Alcohol/week: 1.0 standard drink of alcohol    Types: 1 Cans of beer per week    Comment: Gin 2 x week   Drug use: No   Sexual activity: Yes    Comment: regular exercise - yes  Other Topics Concern   Not on file  Social History Narrative   Retired from being an Financial controller of a small business    Married    No kids       He likes to play golf and rides bike       Social Determinants of Radio broadcast assistant Strain: Low Risk  (01/04/2022)   Overall Financial Resource Strain (CARDIA)    Difficulty of Paying Living Expenses: Not hard at all  Food Insecurity: No Food Insecurity (01/04/2022)   Hunger Vital Sign    Worried About Running Out of Food in the Last Year: Never true    [place] in the Last  Year: Never true  Transportation Needs: No Transportation Needs (01/04/2022)   PRAPARE - Hydrologist (Medical): No    Lack of Transportation (Non-Medical): No  Physical Activity: Sufficiently Active (01/04/2022)   Exercise Vital Sign    Days of Exercise per Week: 4 days    Minutes of Exercise per Session: 100 min  Stress: No Stress Concern Present (01/04/2022)   [place]    Feeling of Stress : Not at all  Social Connections: Moderately  Isolated (01/29/2021)   Social Connection and Isolation Panel [NHANES]    Frequency of Communication with Friends and Family: Three times a week    Frequency of Social Gatherings with Friends and Family: Three times a week    Attends Religious Services: Never    Active Member of Clubs or Organizations: No    Attends Archivist Meetings: Never    Marital Status: Married     Family History: The patient's family history includes Cancer in his mother; Coronary artery disease in his father; Depression in his mother and paternal grandfather; Hyperlipidemia in an other family member; Hypertension in an other family member. There is no history of Colon cancer.  ROS:   Please see the history of present illness.    Review of Systems  Constitutional:  Negative for chills and fever.  HENT:  Negative for hearing loss and sore throat.   Eyes:  Negative for blurred vision and redness.  Respiratory:  Negative for shortness of breath.   Cardiovascular:  Positive for leg swelling (Bilateral). Negative for chest pain, palpitations, orthopnea, claudication and PND.  Gastrointestinal:  Negative for melena, nausea and vomiting.  Genitourinary:  Negative for dysuria and flank pain.  Musculoskeletal:  Positive for joint pain (Bilateral knees). Negative for falls and myalgias.  Neurological:  Negative for dizziness and loss of consciousness.  Endo/Heme/Allergies:   Bruises/bleeds easily.  Psychiatric/Behavioral:  Negative for substance abuse.     EKGs/Labs/Other Studies Reviewed:    The following studies were reviewed today: Abdominal Aorta US 10/20/21 Summary:  Abdominal Aorta: There is evidence of abnormal dilatation of the distal  Abdominal aorta. The largest aortic diameter remains essentially unchanged  compared to prior exam. Previous diameter measurement was 3.4 cm obtained  on 10/2019.  Stenosis: +------------------+-------------+----------------+  Location          Stenosis     Comments          +------------------+-------------+----------------+  Right Common Iliac<50% stenosis                  +------------------+-------------+----------------+  Left Common Iliac >50% stenosislow end of range  +------------------+-------------+----------------+     IVC/Iliac: There is no evidence of thrombus involving the IVC.   TTE 02/2021: IMPRESSIONS   1. Left ventricular ejection fraction, by estimation, is 60 to 65%. The left ventricle has normal function. The left ventricle has no regional wall motion abnormalities. There is mild concentric left ventricular hypertrophy. Left ventricular diastolic parameters are consistent with Grade I diastolic dysfunction (impaired relaxation).   2. Right ventricular systolic function is normal. The right ventricular size is normal.   3. Left atrial size was moderately dilated.   4. The mitral valve is normal in structure. No evidence of mitral valve regurgitation. No evidence of mitral stenosis.   5. The aortic valve is normal in structure. Aortic valve regurgitation is not visualized. Mild aortic valve sclerosis is present, with no evidence of aortic valve stenosis.   6. The inferior vena cava is normal in size with greater than 50% respiratory variability, suggesting right atrial pressure of 3 mmHg.  Cath 05/13/20: Total occlusion of the mid LAD, total occlusion of the mid RCA, and high-grade  calcified obstruction in the ostial to proximal circumflex with diffuse 50% left main stenosis. Patent saphenous vein graft to the diagonal with 70 to 80% stenosis distal to the graft insertion site.  The diagonal fills retrogradely into the first septal perforator and the proximal LAD segment. Diffuse 95% in-stent restenosis  in the ostial to proximal saphenous vein graft to the distal RCA. Normal LV function.  LVEDP is normal.  EF is estimated to be 60%.   RECOMMENDATIONS:   Critical decision point for this patient who had coronary bypass surgery in 1991, and now has diffuse in-stent restenosis in the ostium and proximal segment of the saphenous vein graft to the right coronary.  The native circumflex is heavily calcified and has critical stenosis.  The diagonal beyond the saphenous vein graft insertion site has significant disease.  Long-term patency of the right coronary graft will be markedly reduced PCI and restenting.  The technical standpoint it appears that the stent extends outside the ostium of the bypass graft.  The right coronary territory is large.  The patient is relatively healthy.  His best long-term approach may be repeat coronary bypass surgery.  Before starting down the path of stent implantation, will  refer for heart team evaluation to consider surgical revascularization.  If deemed not a surgical candidate or if the patient prefers PCI, this could be attempted but will carry higher technical risk and likelihood of repeat revascularization.     CTA CHEST IMPRESSION 09/2020: 1. Mild likely consistent with very mild colitis and/or diverticulitis involving the mid to distal descending colon. 2. 3.5 cm x 3.2 cm focal aneurysmal dilatation of the infrarenal abdominal aorta. 3. Large right pleural effusion. 4. Mild-to-moderate severity right basilar atelectasis and/or infiltrate. 5. Total right hip replacement. 6. Aortic atherosclerosis.   Aortic Atherosclerosis (ICD10-I70.0).      OPERATIVE REPORT DATE OF PROCEDURE:  05/27/2020   NAME OF PROCEDURE:  Redo coronary artery bypass grafting with free right internal mammary artery to the obtuse marginal and left radial graft to the posterior descending coronary artery with open left radial harvesting   SURGEON:  Lanelle Bal, MD   FIRST ASSISTANT:  Jadene Pierini, PA       ECHO IMPRESSIONS 05/2020   1. Left ventricular ejection fraction, by estimation, is 60 to 65%. The left ventricle has normal function. The left ventricle has no regional wall motion abnormalities. There is moderate left ventricular hypertrophy. Left ventricular diastolic parameters were normal.   2. Right ventricular systolic function is mildly reduced. The right ventricular size is normal. Tricuspid regurgitation signal is inadequate for assessing PA pressure.   3. Left atrial size was moderately dilated.   4. The mitral valve is normal in structure. Trivial mitral valve regurgitation.   5. The aortic valve is tricuspid. Aortic valve regurgitation is not visualized. Mild to moderate aortic valve sclerosis/calcification is present, without any evidence of aortic stenosis.   6. The inferior vena cava is normal in size with greater than 50% respiratory variability, suggesting right atrial pressure of 3 mmHg.     EKG:   02/03/22: NSR, HR 65 bpm; inferior T-waves  Recent Labs: 02/09/2022: ALT 30; BUN 19; Creatinine, Ser 1.28; Hemoglobin 16.2; Platelets 180.0; Potassium 4.2; Sodium 140; TSH 3.57  Recent Lipid Panel    Component Value Date/Time   CHOL 131 02/09/2022 0809   CHOL 92 (L) 07/09/2020 1527   TRIG 78.0 02/09/2022 0809   HDL 43.60 02/09/2022 0809   HDL 30 (L) 07/09/2020 1527   CHOLHDL 3 02/09/2022 0809   VLDL 15.6 02/09/2022 0809   LDLCALC 72 02/09/2022 0809   LDLCALC 44 07/09/2020 1527   LDLDIRECT 66.7 11/30/2011 1326      Physical Exam:    VS:  There were no vitals taken for this visit.  Wt Readings from Last 3 Encounters:   02/17/22 216 lb (98 kg)  02/09/22 216 lb (98 kg)  02/03/22 214 lb (97.1 kg)     GEN:  Well nourished, well developed in no acute distress HEENT: Normal NECK: No JVD; No carotid bruits CARDIAC: RRR, no murmurs, rubs, gallops RESPIRATORY:  Clear to auscultation without rales, wheezing or rhonchi  ABDOMEN: Soft, non-tender, non-distended MUSCULOSKELETAL:  No edema; No deformity  SKIN: Warm and dry NEUROLOGIC:  Alert and oriented x 3 PSYCHIATRIC:  Normal affect   ASSESSMENT:    No diagnosis found.   PLAN:    In order of problems listed above:  #CAD s/p CABG with re-do CABG in 05/2020: Initial CABG in 1991 with LIMA to LAD, SVG to DX and SVG to distal RCA and re-do CABG in 05/2020 with radial artery to PDA and RIMA to OM. Doing well with no anginal symptoms. Remains active. TTE pre-bypass with LVEF 60-65%, no WMA. TTE post bypass 02/2021 with EF 60-65, no RWMA, mild LVH, GR 1 DD, normal RVSF, moderate LAE, mild AV sclerosis without stenosis. -Continue plavix '75mg'$  daily -Continue metop succinate '25mg'$  XL -Continue lisinopril '20mg'$  daily -Continue lipitor '80mg'$  daily   #HTN: Well controlled. -Continue lisinopril '20mg'$  daily -Continue metop succinate '25mg'$  XL daily -Continue HCTZ 12.'5mg'$  daily   #HLD: LDL  72. Goal <55.  -Continue lipitor '80mg'$  daily -??Start zetia '10mg'$  daily  #AAA: Enlarged from 3.4>3.7. Recommended for annual ultrasounds at this time. -Continue serial abdominal ultrasounds (will do in 08/2022 as patient leaves for [place] in late fall-spring)     Medication Adjustments/Labs and Tests Ordered: Current medicines are reviewed at length with the patient today.  Concerns regarding medicines are outlined above.  No orders of the defined types were placed in this encounter.  No orders of the defined types were placed in this encounter.   There are no Patient Instructions on file for this visit.   I,Mykaella Javier,acting as a scribe for Freada Bergeron,  MD.,have documented all relevant documentation on the behalf of Freada Bergeron, MD,as directed by  Freada Bergeron, MD while in the presence of Freada Bergeron, MD.  I, Freada Bergeron, MD, have reviewed all documentation for this visit. The documentation on 05/06/22 for the exam, diagnosis, procedures, and orders are all accurate and complete.   Signed, Freada Bergeron, MD  05/06/2022 8:59 AM    [facility]

## 2022-05-07 ENCOUNTER — Ambulatory Visit: Payer: Medicare HMO | Admitting: Cardiology

## 2022-05-11 DIAGNOSIS — F331 Major depressive disorder, recurrent, moderate: Secondary | ICD-10-CM | POA: Diagnosis not present

## 2022-05-12 DIAGNOSIS — I2510 Atherosclerotic heart disease of native coronary artery without angina pectoris: Secondary | ICD-10-CM | POA: Diagnosis not present

## 2022-05-12 DIAGNOSIS — H269 Unspecified cataract: Secondary | ICD-10-CM | POA: Diagnosis not present

## 2022-05-12 DIAGNOSIS — E669 Obesity, unspecified: Secondary | ICD-10-CM | POA: Diagnosis not present

## 2022-05-12 DIAGNOSIS — I252 Old myocardial infarction: Secondary | ICD-10-CM | POA: Diagnosis not present

## 2022-05-12 DIAGNOSIS — E785 Hyperlipidemia, unspecified: Secondary | ICD-10-CM | POA: Diagnosis not present

## 2022-05-12 DIAGNOSIS — Z6833 Body mass index (BMI) 33.0-33.9, adult: Secondary | ICD-10-CM | POA: Diagnosis not present

## 2022-05-12 DIAGNOSIS — R69 Illness, unspecified: Secondary | ICD-10-CM | POA: Diagnosis not present

## 2022-05-12 DIAGNOSIS — Z7902 Long term (current) use of antithrombotics/antiplatelets: Secondary | ICD-10-CM | POA: Diagnosis not present

## 2022-05-12 DIAGNOSIS — I10 Essential (primary) hypertension: Secondary | ICD-10-CM | POA: Diagnosis not present

## 2022-05-12 DIAGNOSIS — G4700 Insomnia, unspecified: Secondary | ICD-10-CM | POA: Diagnosis not present

## 2022-05-12 DIAGNOSIS — N529 Male erectile dysfunction, unspecified: Secondary | ICD-10-CM | POA: Diagnosis not present

## 2022-05-12 DIAGNOSIS — R609 Edema, unspecified: Secondary | ICD-10-CM | POA: Diagnosis not present

## 2022-05-21 NOTE — Progress Notes (Unsigned)
Cardiology Office Note:    Date:  05/21/2022   ID:  Dave Robles, DOB [birth year], MRN [id number]  PCP:  Dorothyann Peng, NP   [facility]  Cardiologist:  Freada Bergeron, MD  Advanced Practice Provider:  No care team member to display Electrophysiologist:  None    Referring MD: Dorothyann Peng, NP    History of Present Illness:    Dave Robles is a 76 y.o. male with a hx of CAD with original CABG x 3 with LIMA to LAD, SVG to DX and SVG to distal RCA in 1991 by Dr. Redmond Pulling, HTN, HLD, ED, past tobacco abuse, asthma, AAA (last measured in 10/2019 - 3.4 cm) and depression who presents to clinic for follow-up. Was previously followed by [name].   Per review of the record, the patient had a non-ST elevation myocardial infarction in 2016 while vacationing in [place].  Cath noted to have normal left main, 90% prox LAD, 99% mid LAD with patent LIMA to mid LAD and patent SVG to DX. LCX origin stenosis vs flow void? Fistula vs competitive flow in branch?, 100% RCA, SVG to RCA 99% origin treated with Xience DES and PTCA to the distal.   Patient has been closely followed by [name]. Was seen in 04/2020 with worsening dyspnea on exertion which is his anginal equivalent. Was referred for cardiac cath at that time which led to need for repeat CABG on 05/27/20. Specifically, he received a left radial artery to PDA, and a free right internal mammary artery to obtuse marginal.  Post-op course complicated by need for inotropic support and blood loss anemia requiring blood transfusion. Repeat TTE 03/04/21 with EF 60-65, no RWMA, mild LVH, GR 1 DD, normal RVSF, moderate LAE, mild AV sclerosis without stenosis.   Was last seen in clinic on 01/2022 where he was doing very well.  Active without anginal symptoms.   Today, ***  Past Medical History:  Diagnosis Date   AAA (abdominal aortic aneurysm) (HCC)    AAA (abdominal aortic aneurysm) without rupture ([place])  01/19/2017   Adjustment disorder with depressed mood 03/04/2009   Arthritis    Colon polyps    CORONARY ARTERY DISEASE 07/10/2007   Coronary artery disease involving native coronary artery of native heart without angina pectoris 07/10/2007   Qualifier: Diagnosis of  By: Sherren Mocha, RN, Dorian Pod     Depression    DOE (dyspnea on exertion)    Episodic mood disorder ([place]) 08/01/2014   ERECTILE DYSFUNCTION 07/10/2007   Extrinsic asthma, unspecified 09/10/2008   HYPERLIPIDEMIA 07/10/2007   HYPERTENSION 07/10/2007   Myocardial infarction ([place]) 2016   when had angioplasty- was told had a silent MI   OA (osteoarthritis) of hip 04/19/2018   Onychomycosis 05/14/2014   Pain in joint of left knee 01/19/2017   Personal history of colonic polyps 07/08/2014   1997, 2001 8 mm transverse serrated polyp right colon 2010 07/08/2014 no polyps - repeat colon 2022      S/P CABG x 2 05/27/2020   TOBACCO USE 12/15/2009    Past Surgical History:  Procedure Laterality Date   COLONOSCOPY     CORONARY ANGIOPLASTY  12/2014   CORONARY ARTERY BYPASS GRAFT  11/15/89   5 vessels   CORONARY ARTERY BYPASS GRAFT N/A 05/27/2020   Procedure: REDO CORONARY ARTERY BYPASS GRAFTING (CABG), ON PUMP, TIMES TWO, USING RIGHT INTERNAL MAMMARY ARTERY AND LEFT RADIAL ARTERY (OPEN);  Surgeon: Grace Isaac, MD;  Location: [facility];  Service: Open Heart Surgery;  Laterality: N/A;  Left Radial Artery to PDA FREE RIMA to OM   HYDROCELE EXCISION / REPAIR  2011   INGUINAL HERNIA REPAIR Left 1985   LEFT HEART CATH AND CORS/GRAFTS ANGIOGRAPHY N/A 05/13/2020   Procedure: LEFT HEART CATH AND CORS/GRAFTS ANGIOGRAPHY;  Surgeon: Belva Crome, MD;  Location: [facility];  Service: Cardiovascular;  Laterality: N/A;   MEDIASTERNOTOMY N/A 05/27/2020   Procedure: MEDIAN STERNOTOMY;  Surgeon: Grace Isaac, MD;  Location: [facility];  Service: Open Heart Surgery;  Laterality: N/A;  Redo Median Sternotomy   RADIAL ARTERY HARVEST Left 05/27/2020   Procedure: RADIAL  ARTERY HARVEST;  Surgeon: Grace Isaac, MD;  Location: [facility];  Service: Open Heart Surgery;  Laterality: Left;   TEE WITHOUT CARDIOVERSION N/A 05/27/2020   Procedure: TRANSESOPHAGEAL ECHOCARDIOGRAM (TEE);  Surgeon: Grace Isaac, MD;  Location: [facility];  Service: Open Heart Surgery;  Laterality: N/A;   TOTAL HIP ARTHROPLASTY Right 04/19/2018   Procedure: RIGHT TOTAL HIP ARTHROPLASTY ANTERIOR APPROACH;  Surgeon: Gaynelle Arabian, MD;  Location: [facility];  Service: Orthopedics;  Laterality: Right;    Current Medications: No outpatient medications have been marked as taking for the 05/25/22 encounter (Appointment) with Freada Bergeron, MD.     Allergies:   Patient has no known allergies.   Social History   Socioeconomic History   Marital status: Married    Spouse name: Not on file   Number of children: Not on file   Years of education: Not on file   Highest education level: Not on file  Occupational History   Not on file  Tobacco Use   Smoking status: Former    Packs/day: 0.50    Types: Cigarettes    Quit date: 1991    Years since quitting: 32.5   Smokeless tobacco: Never  Vaping Use   Vaping Use: Never used  Substance and Sexual Activity   Alcohol use: Yes    Alcohol/week: 1.0 standard drink of alcohol    Types: 1 Cans of beer per week    Comment: Gin 2 x week   Drug use: No   Sexual activity: Yes    Comment: regular exercise - yes  Other Topics Concern   Not on file  Social History Narrative   Retired from being an Financial controller of a small business    Married    No kids       He likes to play golf and rides bike       Social Determinants of Radio broadcast assistant Strain: Low Risk  (01/04/2022)   Overall Financial Resource Strain (CARDIA)    Difficulty of Paying Living Expenses: Not hard at all  Food Insecurity: No Food Insecurity (01/04/2022)   Hunger Vital Sign    Worried About Running Out of Food in the Last Year: Never true    [place] in the Last  Year: Never true  Transportation Needs: No Transportation Needs (01/04/2022)   PRAPARE - Hydrologist (Medical): No    Lack of Transportation (Non-Medical): No  Physical Activity: Sufficiently Active (01/04/2022)   Exercise Vital Sign    Days of Exercise per Week: 4 days    Minutes of Exercise per Session: 100 min  Stress: No Stress Concern Present (01/04/2022)   [place]    Feeling of Stress : Not at all  Social Connections: Moderately  Isolated (01/29/2021)   Social Connection and Isolation Panel [NHANES]    Frequency of Communication with Friends and Family: Three times a week    Frequency of Social Gatherings with Friends and Family: Three times a week    Attends Religious Services: Never    Active Member of Clubs or Organizations: No    Attends Archivist Meetings: Never    Marital Status: Married     Family History: The patient's family history includes Cancer in his mother; Coronary artery disease in his father; Depression in his mother and paternal grandfather; Hyperlipidemia in an other family member; Hypertension in an other family member. There is no history of Colon cancer.  ROS:   Please see the history of present illness.    Review of Systems  Constitutional:  Negative for chills and fever.  HENT:  Negative for hearing loss and sore throat.   Eyes:  Negative for blurred vision and redness.  Respiratory:  Negative for shortness of breath.   Cardiovascular:  Positive for leg swelling (Bilateral). Negative for chest pain, palpitations, orthopnea, claudication and PND.  Gastrointestinal:  Negative for melena, nausea and vomiting.  Genitourinary:  Negative for dysuria and flank pain.  Musculoskeletal:  Positive for joint pain (Bilateral knees). Negative for falls and myalgias.  Neurological:  Negative for dizziness and loss of consciousness.  Endo/Heme/Allergies:   Bruises/bleeds easily.  Psychiatric/Behavioral:  Negative for substance abuse.     EKGs/Labs/Other Studies Reviewed:    The following studies were reviewed today: Abdominal Aorta US 10/20/21 Summary:  Abdominal Aorta: There is evidence of abnormal dilatation of the distal  Abdominal aorta. The largest aortic diameter remains essentially unchanged  compared to prior exam. Previous diameter measurement was 3.4 cm obtained  on 10/2019.  Stenosis: +------------------+-------------+----------------+  Location          Stenosis     Comments          +------------------+-------------+----------------+  Right Common Iliac<50% stenosis                  +------------------+-------------+----------------+  Left Common Iliac >50% stenosislow end of range  +------------------+-------------+----------------+     IVC/Iliac: There is no evidence of thrombus involving the IVC.   TTE 02/2021: IMPRESSIONS   1. Left ventricular ejection fraction, by estimation, is 60 to 65%. The left ventricle has normal function. The left ventricle has no regional wall motion abnormalities. There is mild concentric left ventricular hypertrophy. Left ventricular diastolic parameters are consistent with Grade I diastolic dysfunction (impaired relaxation).   2. Right ventricular systolic function is normal. The right ventricular size is normal.   3. Left atrial size was moderately dilated.   4. The mitral valve is normal in structure. No evidence of mitral valve regurgitation. No evidence of mitral stenosis.   5. The aortic valve is normal in structure. Aortic valve regurgitation is not visualized. Mild aortic valve sclerosis is present, with no evidence of aortic valve stenosis.   6. The inferior vena cava is normal in size with greater than 50% respiratory variability, suggesting right atrial pressure of 3 mmHg.  Cath 05/13/20: Total occlusion of the mid LAD, total occlusion of the mid RCA, and high-grade  calcified obstruction in the ostial to proximal circumflex with diffuse 50% left main stenosis. Patent saphenous vein graft to the diagonal with 70 to 80% stenosis distal to the graft insertion site.  The diagonal fills retrogradely into the first septal perforator and the proximal LAD segment. Diffuse 95% in-stent restenosis  in the ostial to proximal saphenous vein graft to the distal RCA. Normal LV function.  LVEDP is normal.  EF is estimated to be 60%.   RECOMMENDATIONS:   Critical decision point for this patient who had coronary bypass surgery in 1991, and now has diffuse in-stent restenosis in the ostium and proximal segment of the saphenous vein graft to the right coronary.  The native circumflex is heavily calcified and has critical stenosis.  The diagonal beyond the saphenous vein graft insertion site has significant disease.  Long-term patency of the right coronary graft will be markedly reduced PCI and restenting.  The technical standpoint it appears that the stent extends outside the ostium of the bypass graft.  The right coronary territory is large.  The patient is relatively healthy.  His best long-term approach may be repeat coronary bypass surgery.  Before starting down the path of stent implantation, will  refer for heart team evaluation to consider surgical revascularization.  If deemed not a surgical candidate or if the patient prefers PCI, this could be attempted but will carry higher technical risk and likelihood of repeat revascularization.     CTA CHEST IMPRESSION 09/2020: 1. Mild likely consistent with very mild colitis and/or diverticulitis involving the mid to distal descending colon. 2. 3.5 cm x 3.2 cm focal aneurysmal dilatation of the infrarenal abdominal aorta. 3. Large right pleural effusion. 4. Mild-to-moderate severity right basilar atelectasis and/or infiltrate. 5. Total right hip replacement. 6. Aortic atherosclerosis.   Aortic Atherosclerosis (ICD10-I70.0).      OPERATIVE REPORT DATE OF PROCEDURE:  05/27/2020   NAME OF PROCEDURE:  Redo coronary artery bypass grafting with free right internal mammary artery to the obtuse marginal and left radial graft to the posterior descending coronary artery with open left radial harvesting   SURGEON:  Lanelle Bal, MD   FIRST ASSISTANT:  Jadene Pierini, PA       ECHO IMPRESSIONS 05/2020   1. Left ventricular ejection fraction, by estimation, is 60 to 65%. The left ventricle has normal function. The left ventricle has no regional wall motion abnormalities. There is moderate left ventricular hypertrophy. Left ventricular diastolic parameters were normal.   2. Right ventricular systolic function is mildly reduced. The right ventricular size is normal. Tricuspid regurgitation signal is inadequate for assessing PA pressure.   3. Left atrial size was moderately dilated.   4. The mitral valve is normal in structure. Trivial mitral valve regurgitation.   5. The aortic valve is tricuspid. Aortic valve regurgitation is not visualized. Mild to moderate aortic valve sclerosis/calcification is present, without any evidence of aortic stenosis.   6. The inferior vena cava is normal in size with greater than 50% respiratory variability, suggesting right atrial pressure of 3 mmHg.     EKG:   02/03/22: NSR, HR 65 bpm; inferior T-waves  Recent Labs: 02/09/2022: ALT 30; BUN 19; Creatinine, Ser 1.28; Hemoglobin 16.2; Platelets 180.0; Potassium 4.2; Sodium 140; TSH 3.57  Recent Lipid Panel    Component Value Date/Time   CHOL 131 02/09/2022 0809   CHOL 92 (L) 07/09/2020 1527   TRIG 78.0 02/09/2022 0809   HDL 43.60 02/09/2022 0809   HDL 30 (L) 07/09/2020 1527   CHOLHDL 3 02/09/2022 0809   VLDL 15.6 02/09/2022 0809   LDLCALC 72 02/09/2022 0809   LDLCALC 44 07/09/2020 1527   LDLDIRECT 66.7 11/30/2011 1326      Physical Exam:    VS:  There were no vitals taken for this visit.  Wt Readings from Last 3 Encounters:   02/17/22 216 lb (98 kg)  02/09/22 216 lb (98 kg)  02/03/22 214 lb (97.1 kg)     GEN:  Well nourished, well developed in no acute distress HEENT: Normal NECK: No JVD; No carotid bruits CARDIAC: RRR, no murmurs, rubs, gallops RESPIRATORY:  Clear to auscultation without rales, wheezing or rhonchi  ABDOMEN: Soft, non-tender, non-distended MUSCULOSKELETAL:  No edema; No deformity  SKIN: Warm and dry NEUROLOGIC:  Alert and oriented x 3 PSYCHIATRIC:  Normal affect   ASSESSMENT:    No diagnosis found.   PLAN:    In order of problems listed above:  #CAD s/p CABG with re-do CABG in 05/2020: Initial CABG in 1991 with LIMA to LAD, SVG to DX and SVG to distal RCA and re-do CABG in 05/2020 with radial artery to PDA and RIMA to OM. Doing well with no anginal symptoms. Remains active. TTE pre-bypass with LVEF 60-65%, no WMA. TTE post bypass 02/2021 with EF 60-65, no RWMA, mild LVH, GR 1 DD, normal RVSF, moderate LAE, mild AV sclerosis without stenosis. -Continue plavix '75mg'$  daily -Continue metop succinate '25mg'$  XL -Continue lisinopril '20mg'$  daily -Continue lipitor '80mg'$  daily   #HTN: Well controlled. -Continue lisinopril '20mg'$  daily -Continue metop succinate '25mg'$  XL daily -Continue HCTZ 12.'5mg'$  daily   #HLD: LDL  72. Goal <55.  -Continue lipitor '80mg'$  daily -??Start zetia '10mg'$  daily  #AAA: Enlarged from 3.4>3.7. Recommended for annual ultrasounds at this time. -Continue serial abdominal ultrasounds (will do in 08/2022 as patient leaves for [place] in late fall-spring)     Medication Adjustments/Labs and Tests Ordered: Current medicines are reviewed at length with the patient today.  Concerns regarding medicines are outlined above.  No orders of the defined types were placed in this encounter.  No orders of the defined types were placed in this encounter.   There are no Patient Instructions on file for this visit.   I,Mykaella Javier,acting as a scribe for Freada Bergeron,  MD.,have documented all relevant documentation on the behalf of Freada Bergeron, MD,as directed by  Freada Bergeron, MD while in the presence of Freada Bergeron, MD.  I, Freada Bergeron, MD, have reviewed all documentation for this visit. The documentation on 05/21/22 for the exam, diagnosis, procedures, and orders are all accurate and complete.   Signed, Freada Bergeron, MD  05/21/2022 7:10 AM    [place]

## 2022-05-25 ENCOUNTER — Encounter: Payer: Self-pay | Admitting: Cardiology

## 2022-05-25 ENCOUNTER — Ambulatory Visit: Payer: Medicare HMO | Admitting: Cardiology

## 2022-05-25 VITALS — BP 110/64 | HR 60 | Ht 70.0 in | Wt 211.0 lb

## 2022-05-25 DIAGNOSIS — Z79899 Other long term (current) drug therapy: Secondary | ICD-10-CM

## 2022-05-25 DIAGNOSIS — Z951 Presence of aortocoronary bypass graft: Secondary | ICD-10-CM | POA: Diagnosis not present

## 2022-05-25 DIAGNOSIS — E7800 Pure hypercholesterolemia, unspecified: Secondary | ICD-10-CM | POA: Diagnosis not present

## 2022-05-25 DIAGNOSIS — I10 Essential (primary) hypertension: Secondary | ICD-10-CM

## 2022-05-25 DIAGNOSIS — I2510 Atherosclerotic heart disease of native coronary artery without angina pectoris: Secondary | ICD-10-CM

## 2022-05-25 DIAGNOSIS — E782 Mixed hyperlipidemia: Secondary | ICD-10-CM | POA: Diagnosis not present

## 2022-05-25 MED ORDER — EZETIMIBE 10 MG PO TABS: 10.0000 mg | ORAL_TABLET | Freq: Every day | ORAL | 3 refills | Status: DC

## 2022-05-25 NOTE — Progress Notes (Signed)
Cardiology Office Note:    Date:  05/25/2022   ID:  Dave Robles, DOB 1946/09/06, MRN [id number]  PCP:  Dorothyann Peng, NP   [facility]  Cardiologist:  Freada Bergeron, MD  Advanced Practice Provider:  No care team member to display Electrophysiologist:  None    Referring MD: Dorothyann Peng, NP    History of Present Illness:    Dave Robles is a 76 y.o. male with a hx of CAD with original CABG x 3 with LIMA to LAD, SVG to DX and SVG to distal RCA in 1991 by Dr. Redmond Pulling, HTN, HLD, ED, past tobacco abuse, asthma, AAA (last measured in 10/2019 - 3.4 cm) and depression who presents to clinic for follow-up. Was previously followed by [name].   Per review of the record, the patient had a non-ST elevation myocardial infarction in 2016 while vacationing in [place].  Cath noted to have normal left main, 90% prox LAD, 99% mid LAD with patent LIMA to mid LAD and patent SVG to DX. LCX origin stenosis vs flow void? Fistula vs competitive flow in branch?, 100% RCA, SVG to RCA 99% origin treated with Xience DES and PTCA to the distal.   Patient has been closely followed by [name]. Was seen in 04/2020 with worsening dyspnea on exertion which is his anginal equivalent. Was referred for cardiac cath at that time which led to need for repeat CABG on 05/27/20. Specifically, he received a left radial artery to PDA, and a free right internal mammary artery to obtuse marginal.  Post-op course complicated by need for inotropic support and blood loss anemia requiring blood transfusion. Repeat TTE 03/04/21 with EF 60-65, no RWMA, mild LVH, GR 1 DD, normal RVSF, moderate LAE, mild AV sclerosis without stenosis.   Was last seen in clinic on 01/2022 where he was doing very well.  Active without anginal symptoms.   Today, he overall is feeling well. He stays active by doing cardio and stretching at the gym. He plans to ride his bike once it is fixed. He only becomes  short of breath with significant exertion. Has occasional LE edema when sitting for long periods of time. He remains compliant with his medications including Plavix, and Furosemide which he takes every three days. The patient denies chest pain, nocturnal dyspnea, or orthopnea. There have been no palpitations, lightheadedness or syncope.   Past Medical History:  Diagnosis Date   AAA (abdominal aortic aneurysm) (HCC)    AAA (abdominal aortic aneurysm) without rupture ([place]) 01/19/2017   Adjustment disorder with depressed mood 03/04/2009   Arthritis    Colon polyps    CORONARY ARTERY DISEASE 07/10/2007   Coronary artery disease involving native coronary artery of native heart without angina pectoris 07/10/2007   Qualifier: Diagnosis of  By: Sherren Mocha, RN, Dorian Pod     Depression    DOE (dyspnea on exertion)    Episodic mood disorder ([place]) 08/01/2014   ERECTILE DYSFUNCTION 07/10/2007   Extrinsic asthma, unspecified 09/10/2008   HYPERLIPIDEMIA 07/10/2007   HYPERTENSION 07/10/2007   Myocardial infarction ([place]) 2016   when had angioplasty- was told had a silent MI   OA (osteoarthritis) of hip 04/19/2018   Onychomycosis 05/14/2014   Pain in joint of left knee 01/19/2017   Personal history of colonic polyps 07/08/2014   1997, 2001 8 mm transverse serrated polyp right colon 2010 07/08/2014 no polyps - repeat colon 2022      S/P CABG x 2 05/27/2020  TOBACCO USE 12/15/2009    Past Surgical History:  Procedure Laterality Date   COLONOSCOPY     CORONARY ANGIOPLASTY  12/2014   CORONARY ARTERY BYPASS GRAFT  11/15/89   5 vessels   CORONARY ARTERY BYPASS GRAFT N/A 05/27/2020   Procedure: REDO CORONARY ARTERY BYPASS GRAFTING (CABG), ON PUMP, TIMES TWO, USING RIGHT INTERNAL MAMMARY ARTERY AND LEFT RADIAL ARTERY (OPEN);  Surgeon: Grace Isaac, MD;  Location: [facility];  Service: Open Heart Surgery;  Laterality: N/A;  Left Radial Artery to PDA FREE RIMA to OM   HYDROCELE EXCISION / REPAIR  2011   INGUINAL HERNIA REPAIR Left  1985   LEFT HEART CATH AND CORS/GRAFTS ANGIOGRAPHY N/A 05/13/2020   Procedure: LEFT HEART CATH AND CORS/GRAFTS ANGIOGRAPHY;  Surgeon: Belva Crome, MD;  Location: [facility];  Service: Cardiovascular;  Laterality: N/A;   MEDIASTERNOTOMY N/A 05/27/2020   Procedure: MEDIAN STERNOTOMY;  Surgeon: Grace Isaac, MD;  Location: [place];  Service: Open Heart Surgery;  Laterality: N/A;  Redo Median Sternotomy   RADIAL ARTERY HARVEST Left 05/27/2020   Procedure: RADIAL ARTERY HARVEST;  Surgeon: Grace Isaac, MD;  Location: [place] [place];  Service: Open Heart Surgery;  Laterality: Left;   TEE WITHOUT CARDIOVERSION N/A 05/27/2020   Procedure: TRANSESOPHAGEAL ECHOCARDIOGRAM (TEE);  Surgeon: Grace Isaac, MD;  Location: [facility];  Service: Open Heart Surgery;  Laterality: N/A;   TOTAL HIP ARTHROPLASTY Right 04/19/2018   Procedure: RIGHT TOTAL HIP ARTHROPLASTY ANTERIOR APPROACH;  Surgeon: Gaynelle Arabian, MD;  Location: [facility];  Service: Orthopedics;  Laterality: Right;    Current Medications: Current Meds  Medication Sig   acetaminophen (TYLENOL) 325 MG tablet Take 650 mg by mouth every 6 (six) hours as needed for moderate pain or headache.    atorvastatin (LIPITOR) 80 MG tablet TAKE 1 TABLET DAILY   azelastine (ASTELIN) 0.1 % nasal spray Place 1 spray into both nostrils 2 (two) times daily. Use in each nostril as directed   buPROPion (WELLBUTRIN XL) 300 MG 24 hr tablet Take 1 tablet (300 mg total) by mouth daily.   calcipotriene (DOVONOX) 0.005 % cream Apply 1 application topically 2 (two) times daily.   clobetasol cream (TEMOVATE) 4.09 % Apply 1 application topically daily.   clopidogrel (PLAVIX) 75 MG tablet Take 1 tablet (75 mg total) by mouth daily.   ezetimibe (ZETIA) 10 MG tablet Take 1 tablet (10 mg total) by mouth daily.   furosemide (LASIX) 20 MG tablet TAKE 1 TABLET DAILY AS     NEEDED FOR LEG SWELLING   hydrochlorothiazide (HYDRODIURIL) 25 MG tablet Take 12.5 mg by mouth daily.    lisinopril (ZESTRIL) 20 MG tablet Take 1 tablet (20 mg total) by mouth daily.   Melatonin 10 MG TABS Take 20 mg by mouth at bedtime.   metoprolol succinate (TOPROL XL) 25 MG 24 hr tablet Take 1 tablet (25 mg total) by mouth daily.   Multiple Vitamin (MULTIVITAMIN) capsule Take 1 capsule by mouth daily.   nitroGLYCERIN (NITROSTAT) 0.4 MG SL tablet Place 1 tablet (0.4 mg total) under the tongue every 5 (five) minutes as needed.   sildenafil (VIAGRA) 100 MG tablet Take 100 mg by mouth daily as needed for erectile dysfunction.   traZODone (DESYREL) 100 MG tablet TAKE 1 TABLET BY MOUTH EVERY DAY AT BEDTIME AS NEEDED FOR SLEEP     Allergies:   Patient has no known allergies.   Social History   Socioeconomic History   Marital status:  Married    Spouse name: Not on file   Number of children: Not on file   Years of education: Not on file   Highest education level: Not on file  Occupational History   Not on file  Tobacco Use   Smoking status: Former    Packs/day: 0.50    Types: Cigarettes    Quit date: 70    Years since quitting: 32.5   Smokeless tobacco: Never  Vaping Use   Vaping Use: Never used  Substance and Sexual Activity   Alcohol use: Yes    Alcohol/week: 1.0 standard drink of alcohol    Types: 1 Cans of beer per week    Comment: Gin 2 x week   Drug use: No   Sexual activity: Yes    Comment: regular exercise - yes  Other Topics Concern   Not on file  Social History Narrative   Retired from being an Financial controller of a small business    Married    No kids       He likes to play golf and rides bike       Social Determinants of Radio broadcast assistant Strain: Low Risk  (01/04/2022)   Overall Financial Resource Strain (CARDIA)    Difficulty of Paying Living Expenses: Not hard at all  Food Insecurity: No Food Insecurity (01/04/2022)   Hunger Vital Sign    Worried About Running Out of Food in the Last Year: Never true    Carlos in the Last Year: Never true   Transportation Needs: No Transportation Needs (01/04/2022)   PRAPARE - Hydrologist (Medical): No    Lack of Transportation (Non-Medical): No  Physical Activity: Sufficiently Active (01/04/2022)   Exercise Vital Sign    Days of Exercise per Week: 4 days    Minutes of Exercise per Session: 100 min  Stress: No Stress Concern Present (01/04/2022)   [place]    Feeling of Stress : Not at all  Social Connections: Moderately Isolated (01/29/2021)   Social Connection and Isolation Panel [NHANES]    Frequency of Communication with Friends and Family: Three times a week    Frequency of Social Gatherings with Friends and Family: Three times a week    Attends Religious Services: Never    Active Member of Clubs or Organizations: No    Attends Archivist Meetings: Never    Marital Status: Married     Family History: The patient's family history includes Cancer in his mother; Coronary artery disease in his father; Depression in his mother and paternal grandfather; Hyperlipidemia in an other family member; Hypertension in an other family member. There is no history of Colon cancer.  ROS:   Please see the history of present illness. (+) LE Edema (+) SOB with exertion All other systems are reviewed and negative.      EKGs/Labs/Other Studies Reviewed:    The following studies were reviewed today: Abdominal Aorta US 10/20/21 Summary:  Abdominal Aorta: There is evidence of abnormal dilatation of the distal  Abdominal aorta. The largest aortic diameter remains essentially unchanged  compared to prior exam. Previous diameter measurement was 3.4 cm obtained  on 10/2019.  Stenosis: +------------------+-------------+----------------+  Location          Stenosis     Comments          +------------------+-------------+----------------+  Right Common Iliac<50% stenosis                   +------------------+-------------+----------------+  Left Common Iliac >50% stenosislow end of range  +------------------+-------------+----------------+     IVC/Iliac: There is no evidence of thrombus involving the IVC.   TTE 02/2021: IMPRESSIONS   1. Left ventricular ejection fraction, by estimation, is 60 to 65%. The left ventricle has normal function. The left ventricle has no regional wall motion abnormalities. There is mild concentric left ventricular hypertrophy. Left ventricular diastolic parameters are consistent with Grade I diastolic dysfunction (impaired relaxation).   2. Right ventricular systolic function is normal. The right ventricular size is normal.   3. Left atrial size was moderately dilated.   4. The mitral valve is normal in structure. No evidence of mitral valve regurgitation. No evidence of mitral stenosis.   5. The aortic valve is normal in structure. Aortic valve regurgitation is not visualized. Mild aortic valve sclerosis is present, with no evidence of aortic valve stenosis.   6. The inferior vena cava is normal in size with greater than 50% respiratory variability, suggesting right atrial pressure of 3 mmHg.  Cath 05/13/20: Total occlusion of the mid LAD, total occlusion of the mid RCA, and high-grade calcified obstruction in the ostial to proximal circumflex with diffuse 50% left main stenosis. Patent saphenous vein graft to the diagonal with 70 to 80% stenosis distal to the graft insertion site.  The diagonal fills retrogradely into the first septal perforator and the proximal LAD segment. Diffuse 95% in-stent restenosis in the ostial to proximal saphenous vein graft to the distal RCA. Normal LV function.  LVEDP is normal.  EF is estimated to be 60%.   RECOMMENDATIONS:   Critical decision point for this patient who had coronary bypass surgery in 1991, and now has diffuse in-stent restenosis in the ostium and proximal segment of the saphenous vein graft to  the right coronary.  The native circumflex is heavily calcified and has critical stenosis.  The diagonal beyond the saphenous vein graft insertion site has significant disease.  Long-term patency of the right coronary graft will be markedly reduced PCI and restenting.  The technical standpoint it appears that the stent extends outside the ostium of the bypass graft.  The right coronary territory is large.  The patient is relatively healthy.  His best long-term approach may be repeat coronary bypass surgery.  Before starting down the path of stent implantation, will  refer for heart team evaluation to consider surgical revascularization.  If deemed not a surgical candidate or if the patient prefers PCI, this could be attempted but will carry higher technical risk and likelihood of repeat revascularization.     CTA CHEST IMPRESSION 09/2020: 1. Mild likely consistent with very mild colitis and/or diverticulitis involving the mid to distal descending colon. 2. 3.5 cm x 3.2 cm focal aneurysmal dilatation of the infrarenal abdominal aorta. 3. Large right pleural effusion. 4. Mild-to-moderate severity right basilar atelectasis and/or infiltrate. 5. Total right hip replacement. 6. Aortic atherosclerosis.   Aortic Atherosclerosis (ICD10-I70.0).     OPERATIVE REPORT DATE OF PROCEDURE:  05/27/2020   NAME OF PROCEDURE:  Redo coronary artery bypass grafting with free right internal mammary artery to the obtuse marginal and left radial graft to the posterior descending coronary artery with open left radial harvesting   SURGEON:  Lanelle Bal, MD   FIRST ASSISTANT:  Jadene Pierini, PA     ECHO IMPRESSIONS 05/2020   1. Left ventricular ejection fraction, by estimation, is 60 to 65%. The left ventricle has normal function. The left ventricle has no regional wall motion abnormalities. There  is moderate left ventricular hypertrophy. Left ventricular diastolic parameters were normal.   2. Right ventricular  systolic function is mildly reduced. The right ventricular size is normal. Tricuspid regurgitation signal is inadequate for assessing PA pressure.   3. Left atrial size was moderately dilated.   4. The mitral valve is normal in structure. Trivial mitral valve regurgitation.   5. The aortic valve is tricuspid. Aortic valve regurgitation is not visualized. Mild to moderate aortic valve sclerosis/calcification is present, without any evidence of aortic stenosis.   6. The inferior vena cava is normal in size with greater than 50% respiratory variability, suggesting right atrial pressure of 3 mmHg.     EKG:  EKG is personally reviewed.  05/25/2022 No new ECG  02/03/22: NSR, HR 65 bpm; inferior T-waves  Recent Labs: 02/09/2022: ALT 30; BUN 19; Creatinine, Ser 1.28; Hemoglobin 16.2; Platelets 180.0; Potassium 4.2; Sodium 140; TSH 3.57  Recent Lipid Panel    Component Value Date/Time   CHOL 131 02/09/2022 0809   CHOL 92 (L) 07/09/2020 1527   TRIG 78.0 02/09/2022 0809   HDL 43.60 02/09/2022 0809   HDL 30 (L) 07/09/2020 1527   CHOLHDL 3 02/09/2022 0809   VLDL 15.6 02/09/2022 0809   LDLCALC 72 02/09/2022 0809   LDLCALC 44 07/09/2020 1527   LDLDIRECT 66.7 11/30/2011 1326      Physical Exam:    VS:  BP 110/64   Pulse 60   Ht '5\' 10"'$  (1.778 m)   Wt 211 lb (95.7 kg)   SpO2 94%   BMI 30.28 kg/m     Wt Readings from Last 3 Encounters:  05/25/22 211 lb (95.7 kg)  02/17/22 216 lb (98 kg)  02/09/22 216 lb (98 kg)     GEN:  Well nourished, well developed in no acute distress HEENT: Normal NECK: No JVD; No carotid bruits CARDIAC: RRR, 1/6 systolic murmur, no rubs or gallops RESPIRATORY:  Clear to auscultation without rales, wheezing or rhonchi  ABDOMEN: Soft, non-tender, non-distended MUSCULOSKELETAL:  No edema; No deformity  SKIN: Warm and dry NEUROLOGIC:  Alert and oriented x 3 PSYCHIATRIC:  Normal affect   ASSESSMENT:    1. Medication management   2. Coronary artery disease  involving native coronary artery of native heart without angina pectoris   3. Essential hypertension   4. Hx of CABG   5. Pure hypercholesterolemia   6. Mixed hyperlipidemia     PLAN:    In order of problems listed above:  #CAD s/p CABG with re-do CABG in 05/2020: Initial CABG in 1991 with LIMA to LAD, SVG to DX and SVG to distal RCA and re-do CABG in 05/2020 with radial artery to PDA and RIMA to OM. Doing well with no anginal symptoms. Remains active. TTE pre-bypass with LVEF 60-65%, no WMA. TTE post bypass 02/2021 with EF 60-65, no RWMA, mild LVH, GR 1 DD, normal RVSF, moderate LAE, mild AV sclerosis without stenosis. -Continue plavix '75mg'$  daily -Continue metop succinate '25mg'$  XL -Continue lisinopril '20mg'$  daily -Continue lipitor '80mg'$  daily   #HTN: Well controlled. -Continue lisinopril '20mg'$  daily -Continue metop succinate '25mg'$  XL daily -Continue HCTZ 12.'5mg'$  daily   #HLD: LDL  72. Goal <55.  -Continue lipitor '80mg'$  daily -Start zetia '10mg'$  daily -Check repeat lipids in 6-8 weeks  #AAA: Enlarged from 3.4>3.7. Recommended for annual ultrasounds at this time. -Continue serial abdominal ultrasounds (will do in 08/2022 as patient leaves for [place] in late fall-spring)   Follow up in 3 months  Medication Adjustments/Labs  and Tests Ordered: Current medicines are reviewed at length with the patient today.  Concerns regarding medicines are outlined above.  Orders Placed This Encounter  Procedures   Lipid Profile   Meds ordered this encounter  Medications   ezetimibe (ZETIA) 10 MG tablet    Sig: Take 1 tablet (10 mg total) by mouth daily.    Dispense:  90 tablet    Refill:  3    Patient Instructions  Medication Instructions:   START TAKING ZETIA 10 MG BY MOUTH DAILY  *If you need a refill on your cardiac medications before your next appointment, please call your pharmacy*   Lab Work:  IN 6-8 WEEKS HERE IN THE OFFICE--CHECK LIPIDS--PLEASE COME FASTING TO THIS LAB  APPOINTMENT  If you have labs (blood work) drawn today and your tests are completely normal, you will receive your results only by: [place] (if you have MyChart) OR A paper copy in the mail If you have any lab test that is abnormal or we need to change your treatment, we will call you to review the results.   Follow-Up:  IN SEPTEMBER 2023 WITH DR. Johney Frame    Important Information About Sugar         I,Tinashe Williams,acting as a scribe for Freada Bergeron, MD.,have documented all relevant documentation on the behalf of Freada Bergeron, MD,as directed by  Freada Bergeron, MD while in the presence of Freada Bergeron, MD.   I, Freada Bergeron, MD, have reviewed all documentation for this visit. The documentation on 05/25/22 for the exam, diagnosis, procedures, and orders are all accurate and complete.

## 2022-05-25 NOTE — Patient Instructions (Signed)
Medication Instructions:   START TAKING ZETIA 10 MG BY MOUTH DAILY  *If you need a refill on your cardiac medications before your next appointment, please call your pharmacy*   Lab Work:  IN 6-8 WEEKS HERE IN THE OFFICE--CHECK LIPIDS--PLEASE COME FASTING TO THIS LAB APPOINTMENT  If you have labs (blood work) drawn today and your tests are completely normal, you will receive your results only by: [place] (if you have MyChart) OR A paper copy in the mail If you have any lab test that is abnormal or we need to change your treatment, we will call you to review the results.   Follow-Up:  IN SEPTEMBER 2023 WITH DR. Johney Frame    Important Information About Sugar

## 2022-05-26 DIAGNOSIS — F331 Major depressive disorder, recurrent, moderate: Secondary | ICD-10-CM | POA: Diagnosis not present

## 2022-06-03 DIAGNOSIS — F331 Major depressive disorder, recurrent, moderate: Secondary | ICD-10-CM | POA: Diagnosis not present

## 2022-06-07 ENCOUNTER — Encounter: Payer: Self-pay | Admitting: Adult Health

## 2022-06-07 DIAGNOSIS — F339 Major depressive disorder, recurrent, unspecified: Secondary | ICD-10-CM

## 2022-06-08 ENCOUNTER — Encounter: Payer: Self-pay | Admitting: Adult Health

## 2022-06-08 DIAGNOSIS — H43813 Vitreous degeneration, bilateral: Secondary | ICD-10-CM | POA: Diagnosis not present

## 2022-06-08 DIAGNOSIS — H401131 Primary open-angle glaucoma, bilateral, mild stage: Secondary | ICD-10-CM | POA: Diagnosis not present

## 2022-06-08 DIAGNOSIS — H40053 Ocular hypertension, bilateral: Secondary | ICD-10-CM | POA: Diagnosis not present

## 2022-06-08 DIAGNOSIS — H35033 Hypertensive retinopathy, bilateral: Secondary | ICD-10-CM | POA: Diagnosis not present

## 2022-06-08 MED ORDER — BUPROPION HCL ER (XL) 300 MG PO TB24: 300.0000 mg | ORAL_TABLET | Freq: Every day | ORAL | 1 refills | Status: DC

## 2022-06-08 NOTE — Telephone Encounter (Signed)
Rx has been refilled.  

## 2022-06-13 ENCOUNTER — Other Ambulatory Visit: Payer: Self-pay | Admitting: Adult Health

## 2022-06-13 DIAGNOSIS — F5101 Primary insomnia: Secondary | ICD-10-CM

## 2022-06-24 DIAGNOSIS — S50812A Abrasion of left forearm, initial encounter: Secondary | ICD-10-CM | POA: Diagnosis not present

## 2022-07-07 DIAGNOSIS — F331 Major depressive disorder, recurrent, moderate: Secondary | ICD-10-CM | POA: Diagnosis not present

## 2022-07-08 ENCOUNTER — Encounter: Payer: Self-pay | Admitting: Adult Health

## 2022-07-08 ENCOUNTER — Other Ambulatory Visit: Payer: Medicare HMO

## 2022-07-08 DIAGNOSIS — E7800 Pure hypercholesterolemia, unspecified: Secondary | ICD-10-CM

## 2022-07-08 DIAGNOSIS — I10 Essential (primary) hypertension: Secondary | ICD-10-CM | POA: Diagnosis not present

## 2022-07-08 DIAGNOSIS — Z951 Presence of aortocoronary bypass graft: Secondary | ICD-10-CM | POA: Diagnosis not present

## 2022-07-08 DIAGNOSIS — Z79899 Other long term (current) drug therapy: Secondary | ICD-10-CM | POA: Diagnosis not present

## 2022-07-08 DIAGNOSIS — I2510 Atherosclerotic heart disease of native coronary artery without angina pectoris: Secondary | ICD-10-CM | POA: Diagnosis not present

## 2022-07-08 DIAGNOSIS — E782 Mixed hyperlipidemia: Secondary | ICD-10-CM | POA: Diagnosis not present

## 2022-07-08 NOTE — Telephone Encounter (Signed)
Please advise 

## 2022-07-09 LAB — LIPID PANEL
Chol/HDL Ratio: 2.9 ratio (ref 0.0–5.0)
Cholesterol, Total: 92 mg/dL — ABNORMAL LOW (ref 100–199)
HDL: 32 mg/dL — ABNORMAL LOW (ref >39)
LDL Chol Calc (NIH): 43 mg/dL (ref 0–99)
Triglycerides: 84 mg/dL (ref 0–149)
VLDL Cholesterol Cal: 17 mg/dL (ref 5–40)

## 2022-07-13 DIAGNOSIS — C44622 Squamous cell carcinoma of skin of right upper limb, including shoulder: Secondary | ICD-10-CM | POA: Diagnosis not present

## 2022-07-13 DIAGNOSIS — L570 Actinic keratosis: Secondary | ICD-10-CM | POA: Diagnosis not present

## 2022-07-13 DIAGNOSIS — L988 Other specified disorders of the skin and subcutaneous tissue: Secondary | ICD-10-CM | POA: Diagnosis not present

## 2022-07-13 DIAGNOSIS — D0472 Carcinoma in situ of skin of left lower limb, including hip: Secondary | ICD-10-CM | POA: Diagnosis not present

## 2022-07-21 DIAGNOSIS — F331 Major depressive disorder, recurrent, moderate: Secondary | ICD-10-CM | POA: Diagnosis not present

## 2022-07-27 DIAGNOSIS — Z85828 Personal history of other malignant neoplasm of skin: Secondary | ICD-10-CM | POA: Diagnosis not present

## 2022-07-27 DIAGNOSIS — L570 Actinic keratosis: Secondary | ICD-10-CM | POA: Diagnosis not present

## 2022-07-29 NOTE — Progress Notes (Deleted)
Cardiology Office Note:    Date:  07/29/2022   ID:  Dave Robles, DOB 1946-05-29, MRN [id number]  PCP:  Dorothyann Peng, NP   [facility]  Cardiologist:  Freada Bergeron, MD  Advanced Practice Provider:  No care team member to display Electrophysiologist:  None    Referring MD: Dorothyann Peng, NP    History of Present Illness:    Dave Robles is a 76 y.o. male with a hx of CAD with original CABG x 3 with LIMA to LAD, SVG to DX and SVG to distal RCA in 1991 by Dr. Redmond Pulling, HTN, HLD, ED, past tobacco abuse, asthma, AAA (last measured in 10/2019 - 3.4 cm) and depression who presents to clinic for follow-up. Was previously followed by [name].   Per review of the record, the patient had a NSTEMI in 2016 while vacationing in [place].  Cath showed normal left main, 90% prox LAD, 99% mid LAD with patent LIMA to mid LAD and patent SVG to DX. LCX origin stenosis vs flow void? Fistula vs competitive flow in branch?, 100% RCA, SVG to RCA 99% origin treated with Xience DES and PTCA.   Patient has been closely followed by [name]. Was seen in 04/2020 with worsening dyspnea on exertion which is his anginal equivalent. Was referred for cardiac cath at that time which led to need for repeat CABG on 05/27/20. Specifically, he received a left radial artery to PDA, and a free right internal mammary artery to obtuse marginal.  Post-op course complicated by need for inotropic support and blood loss anemia requiring blood transfusion. Repeat TTE 03/04/21 with EF 60-65, no RWMA, mild LVH, GR 1 DD, normal RVSF, moderate LAE, mild AV sclerosis without stenosis.   Was last seen in clinic on 05/25/22 where he was doing well. Remained active.   Today, ***  Past Medical History:  Diagnosis Date   AAA (abdominal aortic aneurysm) (HCC)    AAA (abdominal aortic aneurysm) without rupture ([place]) 01/19/2017   Adjustment disorder with depressed mood 03/04/2009   Arthritis     Colon polyps    CORONARY ARTERY DISEASE 07/10/2007   Coronary artery disease involving native coronary artery of native heart without angina pectoris 07/10/2007   Qualifier: Diagnosis of  By: Sherren Mocha, RN, Dorian Pod     Depression    DOE (dyspnea on exertion)    Episodic mood disorder ([place]) 08/01/2014   ERECTILE DYSFUNCTION 07/10/2007   Extrinsic asthma, unspecified 09/10/2008   HYPERLIPIDEMIA 07/10/2007   HYPERTENSION 07/10/2007   Myocardial infarction ([place]) 2016   when had angioplasty- was told had a silent MI   OA (osteoarthritis) of hip 04/19/2018   Onychomycosis 05/14/2014   Pain in joint of left knee 01/19/2017   Personal history of colonic polyps 07/08/2014   1997, 2001 8 mm transverse serrated polyp right colon 2010 07/08/2014 no polyps - repeat colon 2022      S/P CABG x 2 05/27/2020   TOBACCO USE 12/15/2009    Past Surgical History:  Procedure Laterality Date   COLONOSCOPY     CORONARY ANGIOPLASTY  12/2014   CORONARY ARTERY BYPASS GRAFT  11/15/89   5 vessels   CORONARY ARTERY BYPASS GRAFT N/A 05/27/2020   Procedure: REDO CORONARY ARTERY BYPASS GRAFTING (CABG), ON PUMP, TIMES TWO, USING RIGHT INTERNAL MAMMARY ARTERY AND LEFT RADIAL ARTERY (OPEN);  Surgeon: Grace Isaac, MD;  Location: [facility];  Service: Open Heart Surgery;  Laterality: N/A;  Left Radial Artery to  PDA FREE RIMA to OM   HYDROCELE EXCISION / REPAIR  2011   INGUINAL HERNIA REPAIR Left 1985   LEFT HEART CATH AND CORS/GRAFTS ANGIOGRAPHY N/A 05/13/2020   Procedure: LEFT HEART CATH AND CORS/GRAFTS ANGIOGRAPHY;  Surgeon: Belva Crome, MD;  Location: [facility];  Service: Cardiovascular;  Laterality: N/A;   MEDIASTERNOTOMY N/A 05/27/2020   Procedure: MEDIAN STERNOTOMY;  Surgeon: Grace Isaac, MD;  Location: [facility];  Service: Open Heart Surgery;  Laterality: N/A;  Redo Median Sternotomy   RADIAL ARTERY HARVEST Left 05/27/2020   Procedure: RADIAL ARTERY HARVEST;  Surgeon: Grace Isaac, MD;  Location: [facility];  Service:  Open Heart Surgery;  Laterality: Left;   TEE WITHOUT CARDIOVERSION N/A 05/27/2020   Procedure: TRANSESOPHAGEAL ECHOCARDIOGRAM (TEE);  Surgeon: Grace Isaac, MD;  Location: [facility];  Service: Open Heart Surgery;  Laterality: N/A;   TOTAL HIP ARTHROPLASTY Right 04/19/2018   Procedure: RIGHT TOTAL HIP ARTHROPLASTY ANTERIOR APPROACH;  Surgeon: Gaynelle Arabian, MD;  Location: [facility];  Service: Orthopedics;  Laterality: Right;    Current Medications: No outpatient medications have been marked as taking for the 08/09/22 encounter (Appointment) with Freada Bergeron, MD.     Allergies:   Patient has no known allergies.   Social History   Socioeconomic History   Marital status: Married    Spouse name: Not on file   Number of children: Not on file   Years of education: Not on file   Highest education level: Not on file  Occupational History   Not on file  Tobacco Use   Smoking status: Former    Packs/day: 0.50    Types: Cigarettes    Quit date: 1991    Years since quitting: 32.7   Smokeless tobacco: Never  Vaping Use   Vaping Use: Never used  Substance and Sexual Activity   Alcohol use: Yes    Alcohol/week: 1.0 standard drink of alcohol    Types: 1 Cans of beer per week    Comment: Gin 2 x week   Drug use: No   Sexual activity: Yes    Comment: regular exercise - yes  Other Topics Concern   Not on file  Social History Narrative   Retired from being an Financial controller of a small business    Married    No kids       He likes to play golf and rides bike       Social Determinants of Radio broadcast assistant Strain: Low Risk  (01/04/2022)   Overall Financial Resource Strain (CARDIA)    Difficulty of Paying Living Expenses: Not hard at all  Food Insecurity: No Food Insecurity (01/04/2022)   Hunger Vital Sign    Worried About Running Out of Food in the Last Year: Never true    [place] in the Last Year: Never true  Transportation Needs: No Transportation Needs (01/04/2022)    PRAPARE - Hydrologist (Medical): No    Lack of Transportation (Non-Medical): No  Physical Activity: Sufficiently Active (01/04/2022)   Exercise Vital Sign    Days of Exercise per Week: 4 days    Minutes of Exercise per Session: 100 min  Stress: No Stress Concern Present (01/04/2022)   [place]    Feeling of Stress : Not at all  Social Connections: Moderately Isolated (01/29/2021)   Social Connection and Isolation Panel [NHANES]  Frequency of Communication with Friends and Family: Three times a week    Frequency of Social Gatherings with Friends and Family: Three times a week    Attends Religious Services: Never    Active Member of Clubs or Organizations: No    Attends Archivist Meetings: Never    Marital Status: Married     Family History: The patient's family history includes Cancer in his mother; Coronary artery disease in his father; Depression in his mother and paternal grandfather; Hyperlipidemia in an other family member; Hypertension in an other family member. There is no history of Colon cancer.  ROS:   Please see the history of present illness. (+) LE Edema (+) SOB with exertion All other systems are reviewed and negative.      EKGs/Labs/Other Studies Reviewed:    The following studies were reviewed today: Abdominal Aorta US 10/20/21 Summary:  Abdominal Aorta: There is evidence of abnormal dilatation of the distal  Abdominal aorta. The largest aortic diameter remains essentially unchanged  compared to prior exam. Previous diameter measurement was 3.4 cm obtained  on 10/2019.  Stenosis: +------------------+-------------+----------------+  Location          Stenosis     Comments          +------------------+-------------+----------------+  Right Common Iliac<50% stenosis                  +------------------+-------------+----------------+  Left Common  Iliac >50% stenosislow end of range  +------------------+-------------+----------------+     IVC/Iliac: There is no evidence of thrombus involving the IVC.   TTE 02/2021: IMPRESSIONS   1. Left ventricular ejection fraction, by estimation, is 60 to 65%. The left ventricle has normal function. The left ventricle has no regional wall motion abnormalities. There is mild concentric left ventricular hypertrophy. Left ventricular diastolic parameters are consistent with Grade I diastolic dysfunction (impaired relaxation).   2. Right ventricular systolic function is normal. The right ventricular size is normal.   3. Left atrial size was moderately dilated.   4. The mitral valve is normal in structure. No evidence of mitral valve regurgitation. No evidence of mitral stenosis.   5. The aortic valve is normal in structure. Aortic valve regurgitation is not visualized. Mild aortic valve sclerosis is present, with no evidence of aortic valve stenosis.   6. The inferior vena cava is normal in size with greater than 50% respiratory variability, suggesting right atrial pressure of 3 mmHg.  Cath 05/13/20: Total occlusion of the mid LAD, total occlusion of the mid RCA, and high-grade calcified obstruction in the ostial to proximal circumflex with diffuse 50% left main stenosis. Patent saphenous vein graft to the diagonal with 70 to 80% stenosis distal to the graft insertion site.  The diagonal fills retrogradely into the first septal perforator and the proximal LAD segment. Diffuse 95% in-stent restenosis in the ostial to proximal saphenous vein graft to the distal RCA. Normal LV function.  LVEDP is normal.  EF is estimated to be 60%.   RECOMMENDATIONS:   Critical decision point for this patient who had coronary bypass surgery in 1991, and now has diffuse in-stent restenosis in the ostium and proximal segment of the saphenous vein graft to the right coronary.  The native circumflex is heavily calcified and  has critical stenosis.  The diagonal beyond the saphenous vein graft insertion site has significant disease.  Long-term patency of the right coronary graft will be markedly reduced PCI and restenting.  The technical standpoint it appears that the  stent extends outside the ostium of the bypass graft.  The right coronary territory is large.  The patient is relatively healthy.  His best long-term approach may be repeat coronary bypass surgery.  Before starting down the path of stent implantation, will  refer for heart team evaluation to consider surgical revascularization.  If deemed not a surgical candidate or if the patient prefers PCI, this could be attempted but will carry higher technical risk and likelihood of repeat revascularization.     CTA CHEST IMPRESSION 09/2020: 1. Mild likely consistent with very mild colitis and/or diverticulitis involving the mid to distal descending colon. 2. 3.5 cm x 3.2 cm focal aneurysmal dilatation of the infrarenal abdominal aorta. 3. Large right pleural effusion. 4. Mild-to-moderate severity right basilar atelectasis and/or infiltrate. 5. Total right hip replacement. 6. Aortic atherosclerosis.   Aortic Atherosclerosis (ICD10-I70.0).     OPERATIVE REPORT DATE OF PROCEDURE:  05/27/2020   NAME OF PROCEDURE:  Redo coronary artery bypass grafting with free right internal mammary artery to the obtuse marginal and left radial graft to the posterior descending coronary artery with open left radial harvesting   SURGEON:  Lanelle Bal, MD   FIRST ASSISTANT:  Jadene Pierini, PA     ECHO IMPRESSIONS 05/2020   1. Left ventricular ejection fraction, by estimation, is 60 to 65%. The left ventricle has normal function. The left ventricle has no regional wall motion abnormalities. There is moderate left ventricular hypertrophy. Left ventricular diastolic parameters were normal.   2. Right ventricular systolic function is mildly reduced. The right ventricular size is  normal. Tricuspid regurgitation signal is inadequate for assessing PA pressure.   3. Left atrial size was moderately dilated.   4. The mitral valve is normal in structure. Trivial mitral valve regurgitation.   5. The aortic valve is tricuspid. Aortic valve regurgitation is not visualized. Mild to moderate aortic valve sclerosis/calcification is present, without any evidence of aortic stenosis.   6. The inferior vena cava is normal in size with greater than 50% respiratory variability, suggesting right atrial pressure of 3 mmHg.     EKG:  EKG is personally reviewed.  05/25/2022 No new ECG  02/03/22: NSR, HR 65 bpm; inferior T-waves  Recent Labs: 02/09/2022: ALT 30; BUN 19; Creatinine, Ser 1.28; Hemoglobin 16.2; Platelets 180.0; Potassium 4.2; Sodium 140; TSH 3.57  Recent Lipid Panel    Component Value Date/Time   CHOL 92 (L) 07/08/2022 0751   TRIG 84 07/08/2022 0751   HDL 32 (L) 07/08/2022 0751   CHOLHDL 2.9 07/08/2022 0751   CHOLHDL 3 02/09/2022 0809   VLDL 15.6 02/09/2022 0809   LDLCALC 43 07/08/2022 0751   LDLDIRECT 66.7 11/30/2011 1326      Physical Exam:    VS:  There were no vitals taken for this visit.    Wt Readings from Last 3 Encounters:  05/25/22 211 lb (95.7 kg)  02/17/22 216 lb (98 kg)  02/09/22 216 lb (98 kg)     GEN:  Well nourished, well developed in no acute distress HEENT: Normal NECK: No JVD; No carotid bruits CARDIAC: RRR, 1/6 systolic murmur, no rubs or gallops RESPIRATORY:  Clear to auscultation without rales, wheezing or rhonchi  ABDOMEN: Soft, non-tender, non-distended MUSCULOSKELETAL:  No edema; No deformity  SKIN: Warm and dry NEUROLOGIC:  Alert and oriented x 3 PSYCHIATRIC:  Normal affect   ASSESSMENT:    No diagnosis found.   PLAN:    In order of problems listed above:  #CAD s/p CABG  with re-do CABG in 05/2020: Initial CABG in 1991 with LIMA to LAD, SVG to DX and SVG to distal RCA and re-do CABG in 05/2020 with radial artery to PDA  and RIMA to OM. Doing well with no anginal symptoms. Remains active. TTE pre-bypass with LVEF 60-65%, no WMA. TTE post bypass 02/2021 with EF 60-65, no RWMA, mild LVH, GR 1 DD, normal RVSF, moderate LAE, mild AV sclerosis without stenosis. -Continue plavix '75mg'$  daily -Continue metop succinate '25mg'$  XL -Continue lisinopril '20mg'$  daily -Continue lipitor '80mg'$  daily   #HTN: Well controlled. -Continue lisinopril '20mg'$  daily -Continue metop succinate '25mg'$  XL daily -Continue HCTZ 12.'5mg'$  daily   #HLD: LDL  72. Goal <55.  -Continue lipitor '80mg'$  daily -Continue zetia '10mg'$  daily  #AAA: Enlarged from 3.4>3.7. Recommended for annual ultrasounds at this time. -Continue serial abdominal ultrasounds (will do in 08/2022 as patient leaves for [place] in late fall-spring)   Follow up in 3 months  Medication Adjustments/Labs and Tests Ordered: Current medicines are reviewed at length with the patient today.  Concerns regarding medicines are outlined above.  No orders of the defined types were placed in this encounter.  No orders of the defined types were placed in this encounter.   There are no Patient Instructions on file for this visit.

## 2022-08-04 DIAGNOSIS — F331 Major depressive disorder, recurrent, moderate: Secondary | ICD-10-CM | POA: Diagnosis not present

## 2022-08-09 ENCOUNTER — Encounter: Payer: Self-pay | Admitting: Cardiology

## 2022-08-09 ENCOUNTER — Ambulatory Visit: Payer: Medicare HMO | Attending: Cardiology | Admitting: Cardiology

## 2022-08-09 VITALS — BP 122/64 | HR 58 | Ht 70.0 in | Wt 220.6 lb

## 2022-08-09 DIAGNOSIS — I7143 Infrarenal abdominal aortic aneurysm, without rupture: Secondary | ICD-10-CM | POA: Diagnosis not present

## 2022-08-09 DIAGNOSIS — E782 Mixed hyperlipidemia: Secondary | ICD-10-CM

## 2022-08-09 DIAGNOSIS — I2510 Atherosclerotic heart disease of native coronary artery without angina pectoris: Secondary | ICD-10-CM

## 2022-08-09 DIAGNOSIS — Z79899 Other long term (current) drug therapy: Secondary | ICD-10-CM

## 2022-08-09 DIAGNOSIS — I10 Essential (primary) hypertension: Secondary | ICD-10-CM

## 2022-08-09 DIAGNOSIS — Z951 Presence of aortocoronary bypass graft: Secondary | ICD-10-CM

## 2022-08-09 DIAGNOSIS — E7800 Pure hypercholesterolemia, unspecified: Secondary | ICD-10-CM | POA: Diagnosis not present

## 2022-08-09 MED ORDER — EZETIMIBE 10 MG PO TABS: 10.0000 mg | ORAL_TABLET | Freq: Every day | ORAL | 3 refills | Status: DC

## 2022-08-09 NOTE — Patient Instructions (Signed)
Medication Instructions:   Your physician recommends that you continue on your current medications as directed. Please refer to the Current Medication list given to you today.  *If you need a refill on your cardiac medications before your next appointment, please call your pharmacy*   Follow-Up:  IN LATE APRIL 2024 WITH DR. Johney Frame    Important Information About Sugar

## 2022-08-09 NOTE — Progress Notes (Signed)
Cardiology Office Note:    Date:  08/09/2022   ID:  Dave Robles, DOB 16-Dec-1945, MRN [id number]  PCP:  Dorothyann Peng, NP   [facility]  Cardiologist:  Freada Bergeron, MD  Advanced Practice Provider:  No care team member to display Electrophysiologist:  None   Referring MD: Dorothyann Peng, NP    History of Present Illness:    Dave Robles is a 76 y.o. male with a hx of CAD with original CABG x 3 with LIMA to LAD, SVG to DX and SVG to distal RCA in 1991 by Dr. Redmond Pulling, HTN, HLD, ED, past tobacco abuse, asthma, AAA (last measured in 10/2019 - 3.4 cm) and depression who presents to clinic for follow-up. Was previously followed by [name].   Per review of the record, the patient had a NSTEMI in 2016 while vacationing in [place].  Cath showed normal left main, 90% prox LAD, 99% mid LAD with patent LIMA to mid LAD and patent SVG to DX. LCX origin stenosis vs flow void? Fistula vs competitive flow in branch?, 100% RCA, SVG to RCA 99% origin treated with Xience DES and PTCA.   Patient has been closely followed by [name]. Was seen in 04/2020 with worsening dyspnea on exertion which is his anginal equivalent. Was referred for cardiac cath at that time which led to need for repeat CABG on 05/27/20. Specifically, he received a left radial artery to PDA, and a free right internal mammary artery to obtuse marginal.  Post-op course complicated by need for inotropic support and blood loss anemia requiring blood transfusion. Repeat TTE 03/04/21 with EF 60-65, no RWMA, mild LVH, GR 1 DD, normal RVSF, moderate LAE, mild AV sclerosis without stenosis.   Was last seen in clinic on 05/25/22 where he was doing well. Remained active.   Today, the patient states that he is feeling well. He continues to tolerate his medications. His last lab work shows LDL 43 on the zetia '10mg'$  daily.  Currently he is taking 1/2 tablet of HCTZ every day, and 1 tablet of Lasix every  2-3 days. He is not sure if whether or not he swells if he does not take his Lasix. However he does notice mild swelling if he is sitting for a long period of time, such as when on an airplane. If he is more active he does not have much swelling.  Usually he will stay active. Lately he is feeling burned out and not exercising as frequently.  He denies any palpitations, chest pain, or shortness of breath. No lightheadedness, headaches, syncope, orthopnea, or PND.   Past Medical History:  Diagnosis Date   AAA (abdominal aortic aneurysm) (HCC)    AAA (abdominal aortic aneurysm) without rupture ([facility]) 01/19/2017   Adjustment disorder with depressed mood 03/04/2009   Arthritis    Colon polyps    CORONARY ARTERY DISEASE 07/10/2007   Coronary artery disease involving native coronary artery of native heart without angina pectoris 07/10/2007   Qualifier: Diagnosis of  By: Sherren Mocha, RN, Dorian Pod     Depression    DOE (dyspnea on exertion)    Episodic mood disorder ([place]) 08/01/2014   ERECTILE DYSFUNCTION 07/10/2007   Extrinsic asthma, unspecified 09/10/2008   HYPERLIPIDEMIA 07/10/2007   HYPERTENSION 07/10/2007   Myocardial infarction ([facility]) 2016   when had angioplasty- was told had a silent MI   OA (osteoarthritis) of hip 04/19/2018   Onychomycosis 05/14/2014   Pain in joint of left knee 01/19/2017  Personal history of colonic polyps 07/08/2014   1997, 2001 8 mm transverse serrated polyp right colon 2010 07/08/2014 no polyps - repeat colon 2022      S/P CABG x 2 05/27/2020   TOBACCO USE 12/15/2009    Past Surgical History:  Procedure Laterality Date   COLONOSCOPY     CORONARY ANGIOPLASTY  12/2014   CORONARY ARTERY BYPASS GRAFT  11/15/89   5 vessels   CORONARY ARTERY BYPASS GRAFT N/A 05/27/2020   Procedure: REDO CORONARY ARTERY BYPASS GRAFTING (CABG), ON PUMP, TIMES TWO, USING RIGHT INTERNAL MAMMARY ARTERY AND LEFT RADIAL ARTERY (OPEN);  Surgeon: [facility] Isaac, MD;  Location: [facility];  Service: Open Heart  Surgery;  Laterality: N/A;  Left Radial Artery to PDA FREE RIMA to OM   HYDROCELE EXCISION / REPAIR  2011   INGUINAL HERNIA REPAIR Left 1985   LEFT HEART CATH AND CORS/GRAFTS ANGIOGRAPHY N/A 05/13/2020   Procedure: LEFT HEART CATH AND CORS/GRAFTS ANGIOGRAPHY;  Surgeon: Belva Crome, MD;  Location: [facility];  Service: Cardiovascular;  Laterality: N/A;   MEDIASTERNOTOMY N/A 05/27/2020   Procedure: MEDIAN STERNOTOMY;  Surgeon: [facility] Isaac, MD;  Location: [facility];  Service: Open Heart Surgery;  Laterality: N/A;  Redo Median Sternotomy   RADIAL ARTERY HARVEST Left 05/27/2020   Procedure: RADIAL ARTERY HARVEST;  Surgeon: [facility] Isaac, MD;  Location: [facility];  Service: Open Heart Surgery;  Laterality: Left;   TEE WITHOUT CARDIOVERSION N/A 05/27/2020   Procedure: TRANSESOPHAGEAL ECHOCARDIOGRAM (TEE);  Surgeon: [facility] Isaac, MD;  Location: [facility];  Service: Open Heart Surgery;  Laterality: N/A;   TOTAL HIP ARTHROPLASTY Right 04/19/2018   Procedure: RIGHT TOTAL HIP ARTHROPLASTY ANTERIOR APPROACH;  Surgeon: Gaynelle Arabian, MD;  Location: [facility];  Service: Orthopedics;  Laterality: Right;    Current Medications: Current Meds  Medication Sig   acetaminophen (TYLENOL) 325 MG tablet Take 650 mg by mouth every 6 (six) hours as needed for moderate pain or headache.    atorvastatin (LIPITOR) 80 MG tablet TAKE 1 TABLET DAILY   azelastine (ASTELIN) 0.1 % nasal spray Place 1 spray into both nostrils 2 (two) times daily. Use in each nostril as directed   buPROPion (WELLBUTRIN XL) 300 MG 24 hr tablet Take 1 tablet (300 mg total) by mouth daily.   calcipotriene (DOVONOX) 0.005 % cream Apply 1 application topically 2 (two) times daily.   clobetasol cream (TEMOVATE) 3.47 % Apply 1 application topically daily.   clopidogrel (PLAVIX) 75 MG tablet Take 1 tablet (75 mg total) by mouth daily.   furosemide (LASIX) 20 MG tablet TAKE 1 TABLET DAILY AS     NEEDED FOR LEG SWELLING   hydrochlorothiazide  (HYDRODIURIL) 25 MG tablet Take 12.5 mg by mouth daily.   Melatonin 10 MG TABS Take 20 mg by mouth at bedtime.   metoprolol succinate (TOPROL XL) 25 MG 24 hr tablet Take 1 tablet (25 mg total) by mouth daily.   Multiple Vitamin (MULTIVITAMIN) capsule Take 1 capsule by mouth daily.   nitroGLYCERIN (NITROSTAT) 0.4 MG SL tablet Place 1 tablet (0.4 mg total) under the tongue every 5 (five) minutes as needed.   sildenafil (VIAGRA) 100 MG tablet Take 100 mg by mouth daily as needed for erectile dysfunction.   traZODone (DESYREL) 100 MG tablet TAKE 1 TABLET DAILY AT     BEDTIME AS NEEDED FOR SLEEP   [DISCONTINUED] ezetimibe (ZETIA) 10 MG tablet Take 1 tablet (10 mg total) by mouth daily.  Allergies:   Patient has no known allergies.   Social History   Socioeconomic History   Marital status: Married    Spouse name: Not on file   Number of children: Not on file   Years of education: Not on file   Highest education level: Not on file  Occupational History   Not on file  Tobacco Use   Smoking status: Former    Packs/day: 0.50    Types: Cigarettes    Quit date: 1991    Years since quitting: 32.7   Smokeless tobacco: Never  Vaping Use   Vaping Use: Never used  Substance and Sexual Activity   Alcohol use: Yes    Alcohol/week: 1.0 standard drink of alcohol    Types: 1 Cans of beer per week    Comment: Gin 2 x week   Drug use: No   Sexual activity: Yes    Comment: regular exercise - yes  Other Topics Concern   Not on file  Social History Narrative   Retired from being an Financial controller of a small business    Married    No kids       He likes to play golf and rides bike       Social Determinants of Radio broadcast assistant Strain: Low Risk  (01/04/2022)   Overall Financial Resource Strain (CARDIA)    Difficulty of Paying Living Expenses: Not hard at all  Food Insecurity: No Food Insecurity (01/04/2022)   Hunger Vital Sign    Worried About Running Out of Food in the Last Year: Never  true    [place] in the Last Year: Never true  Transportation Needs: No Transportation Needs (01/04/2022)   PRAPARE - Hydrologist (Medical): No    Lack of Transportation (Non-Medical): No  Physical Activity: Sufficiently Active (01/04/2022)   Exercise Vital Sign    Days of Exercise per Week: 4 days    Minutes of Exercise per Session: 100 min  Stress: No Stress Concern Present (01/04/2022)   [place]    Feeling of Stress : Not at all  Social Connections: Moderately Isolated (01/29/2021)   Social Connection and Isolation Panel [NHANES]    Frequency of Communication with Friends and Family: Three times a week    Frequency of Social Gatherings with Friends and Family: Three times a week    Attends Religious Services: Never    Active Member of Clubs or Organizations: No    Attends Archivist Meetings: Never    Marital Status: Married     Family History: The patient's family history includes Cancer in his mother; Coronary artery disease in his father; Depression in his mother and paternal grandfather; Hyperlipidemia in an other family member; Hypertension in an other family member. There is no history of Colon cancer.  ROS:   Review of Systems  Constitutional:  Negative for chills and fever.  HENT:  Negative for nosebleeds and tinnitus.   Eyes:  Negative for blurred vision and pain.  Respiratory:  Negative for cough, hemoptysis, shortness of breath and stridor.   Cardiovascular:  Positive for leg swelling. Negative for chest pain, palpitations, orthopnea, claudication and PND.  Gastrointestinal:  Negative for blood in stool, diarrhea, nausea and vomiting.  Genitourinary:  Negative for dysuria and hematuria.  Musculoskeletal:  Negative for falls.  Neurological:  Negative for dizziness, loss of consciousness and headaches.  Psychiatric/Behavioral:  Negative for  depression,  hallucinations and substance abuse. The patient does not have insomnia.        EKGs/Labs/Other Studies Reviewed:    The following studies were reviewed today:  Abdominal Aorta US 10/20/21 Summary:  Abdominal Aorta: There is evidence of abnormal dilatation of the distal  Abdominal aorta. The largest aortic diameter remains essentially unchanged  compared to prior exam. Previous diameter measurement was 3.4 cm obtained  on 10/2019.  Stenosis: +------------------+-------------+----------------+  Location          Stenosis     Comments          +------------------+-------------+----------------+  Right Common Iliac<50% stenosis                  +------------------+-------------+----------------+  Left Common Iliac >50% stenosislow end of range  +------------------+-------------+----------------+     IVC/Iliac: There is no evidence of thrombus involving the IVC.   TTE 02/2021: IMPRESSIONS   1. Left ventricular ejection fraction, by estimation, is 60 to 65%. The left ventricle has normal function. The left ventricle has no regional wall motion abnormalities. There is mild concentric left ventricular hypertrophy. Left ventricular diastolic parameters are consistent with Grade I diastolic dysfunction (impaired relaxation).   2. Right ventricular systolic function is normal. The right ventricular size is normal.   3. Left atrial size was moderately dilated.   4. The mitral valve is normal in structure. No evidence of mitral valve regurgitation. No evidence of mitral stenosis.   5. The aortic valve is normal in structure. Aortic valve regurgitation is not visualized. Mild aortic valve sclerosis is present, with no evidence of aortic valve stenosis.   6. The inferior vena cava is normal in size with greater than 50% respiratory variability, suggesting right atrial pressure of 3 mmHg.  Cath 05/13/20: Total occlusion of the mid LAD, total occlusion of the mid RCA, and high-grade  calcified obstruction in the ostial to proximal circumflex with diffuse 50% left main stenosis. Patent saphenous vein graft to the diagonal with 70 to 80% stenosis distal to the graft insertion site.  The diagonal fills retrogradely into the first septal perforator and the proximal LAD segment. Diffuse 95% in-stent restenosis in the ostial to proximal saphenous vein graft to the distal RCA. Normal LV function.  LVEDP is normal.  EF is estimated to be 60%.   RECOMMENDATIONS:   Critical decision point for this patient who had coronary bypass surgery in 1991, and now has diffuse in-stent restenosis in the ostium and proximal segment of the saphenous vein graft to the right coronary.  The native circumflex is heavily calcified and has critical stenosis.  The diagonal beyond the saphenous vein graft insertion site has significant disease.  Long-term patency of the right coronary graft will be markedly reduced PCI and restenting.  The technical standpoint it appears that the stent extends outside the ostium of the bypass graft.  The right coronary territory is large.  The patient is relatively healthy.  His best long-term approach may be repeat coronary bypass surgery.  Before starting down the path of stent implantation, will  refer for heart team evaluation to consider surgical revascularization.  If deemed not a surgical candidate or if the patient prefers PCI, this could be attempted but will carry higher technical risk and likelihood of repeat revascularization.     CTA CHEST IMPRESSION 09/2020: 1. Mild likely consistent with very mild colitis and/or diverticulitis involving the mid to distal descending colon. 2. 3.5 cm x 3.2 cm focal aneurysmal dilatation of  the infrarenal abdominal aorta. 3. Large right pleural effusion. 4. Mild-to-moderate severity right basilar atelectasis and/or infiltrate. 5. Total right hip replacement. 6. Aortic atherosclerosis.   Aortic Atherosclerosis (ICD10-I70.0).      OPERATIVE REPORT DATE OF PROCEDURE:  05/27/2020   NAME OF PROCEDURE:  Redo coronary artery bypass grafting with free right internal mammary artery to the obtuse marginal and left radial graft to the posterior descending coronary artery with open left radial harvesting   SURGEON:  Lanelle Bal, MD   FIRST ASSISTANT:  Jadene Pierini, PA     ECHO IMPRESSIONS 05/2020   1. Left ventricular ejection fraction, by estimation, is 60 to 65%. The left ventricle has normal function. The left ventricle has no regional wall motion abnormalities. There is moderate left ventricular hypertrophy. Left ventricular diastolic parameters were normal.   2. Right ventricular systolic function is mildly reduced. The right ventricular size is normal. Tricuspid regurgitation signal is inadequate for assessing PA pressure.   3. Left atrial size was moderately dilated.   4. The mitral valve is normal in structure. Trivial mitral valve regurgitation.   5. The aortic valve is tricuspid. Aortic valve regurgitation is not visualized. Mild to moderate aortic valve sclerosis/calcification is present, without any evidence of aortic stenosis.   6. The inferior vena cava is normal in size with greater than 50% respiratory variability, suggesting right atrial pressure of 3 mmHg.     EKG:  EKG is personally reviewed.  08/09/2022:  EKG was not ordered. 05/25/2022: EKG was not ordered. 02/03/22: NSR, HR 65 bpm; inferior T-waves  Recent Labs: 02/09/2022: ALT 30; BUN 19; Creatinine, Ser 1.28; Hemoglobin 16.2; Platelets 180.0; Potassium 4.2; Sodium 140; TSH 3.57   Recent Lipid Panel    Component Value Date/Time   CHOL 92 (L) 07/08/2022 0751   TRIG 84 07/08/2022 0751   HDL 32 (L) 07/08/2022 0751   CHOLHDL 2.9 07/08/2022 0751   CHOLHDL 3 02/09/2022 0809   VLDL 15.6 02/09/2022 0809   LDLCALC 43 07/08/2022 0751   LDLDIRECT 66.7 11/30/2011 1326      Physical Exam:    VS:  BP 122/64   Pulse (!) 58   Ht '5\' 10"'$  (1.778 m)   Wt  220 lb 9.6 oz (100.1 kg)   SpO2 93%   BMI 31.65 kg/m     Wt Readings from Last 3 Encounters:  08/09/22 220 lb 9.6 oz (100.1 kg)  05/25/22 211 lb (95.7 kg)  02/17/22 216 lb (98 kg)     GEN:  Well nourished, well developed in no acute distress HEENT: Normal NECK: No JVD; No carotid bruits CARDIAC: RRR, 2/6 systolic murmur, no rubs or gallops RESPIRATORY:  Clear to auscultation without rales, wheezing or rhonchi  ABDOMEN: Soft, non-tender, non-distended MUSCULOSKELETAL:  No edema; No deformity  SKIN: Warm and dry NEUROLOGIC:  Alert and oriented x 3 PSYCHIATRIC:  Normal affect   ASSESSMENT:    1. Coronary artery disease involving native coronary artery of native heart without angina pectoris   2. Medication management   3. Essential hypertension   4. Hx of CABG   5. Pure hypercholesterolemia   6. Mixed hyperlipidemia   7. Infrarenal abdominal aortic aneurysm (AAA) without rupture (HCC)      PLAN:    In order of problems listed above:  #CAD s/p CABG with re-do CABG in 05/2020: Initial CABG in 1991 with LIMA to LAD, SVG to DX and SVG to distal RCA and re-do CABG in 05/2020 with radial artery to  PDA and RIMA to OM. TTE pre-bypass with LVEF 60-65%, no WMA. TTE post bypass 02/2021 with EF 60-65, no RWMA, mild LVH, GR 1 DD, normal RVSF, moderate LAE, mild AV sclerosis without stenosis. Currently doing well with no anginal symptoms. -Continue plavix '75mg'$  daily -Continue metop succinate '25mg'$  XL -Continue lisinopril '20mg'$  daily -Continue lipitor '80mg'$  daily   #HTN: Well controlled and at goal <130/90. -Continue lisinopril '20mg'$  daily -Continue metop succinate '25mg'$  XL daily -Continue HCTZ 12.'5mg'$  daily   #HLD: LDL better controlled at 43 (goal <55).  -Continue lipitor '80mg'$  daily -Continue zetia '10mg'$  daily  #AAA: Enlarged from 3.4>3.7. Recommended for annual ultrasounds at this time. -Follow-up ultrasound that is scheduled for 08/17/22  Follow-up:  In 02/2023.  Medication  Adjustments/Labs and Tests Ordered: Current medicines are reviewed at length with the patient today.  Concerns regarding medicines are outlined above.   No orders of the defined types were placed in this encounter.  Meds ordered this encounter  Medications   ezetimibe (ZETIA) 10 MG tablet    Sig: Take 1 tablet (10 mg total) by mouth daily.    Dispense:  90 tablet    Refill:  3   Patient Instructions  Medication Instructions:   Your physician recommends that you continue on your current medications as directed. Please refer to the Current Medication list given to you today.  *If you need a refill on your cardiac medications before your next appointment, please call your pharmacy*   Follow-Up:  IN LATE APRIL 2024 WITH DR. Johney Frame    Important Information About Sugar        I,Mathew Stumpf,acting as a scribe for Freada Bergeron, MD.,have documented all relevant documentation on the behalf of Freada Bergeron, MD,as directed by  Freada Bergeron, MD while in the presence of Freada Bergeron, MD.  I, Freada Bergeron, MD, have reviewed all documentation for this visit. The documentation on 08/09/22 for the exam, diagnosis, procedures, and orders are all accurate and complete.   Signed, Freada Bergeron, MD 08/09/2022  11:43 AM [place]

## 2022-08-10 DIAGNOSIS — H401131 Primary open-angle glaucoma, bilateral, mild stage: Secondary | ICD-10-CM | POA: Diagnosis not present

## 2022-08-10 DIAGNOSIS — H35033 Hypertensive retinopathy, bilateral: Secondary | ICD-10-CM | POA: Diagnosis not present

## 2022-08-10 DIAGNOSIS — H40053 Ocular hypertension, bilateral: Secondary | ICD-10-CM | POA: Diagnosis not present

## 2022-08-17 ENCOUNTER — Ambulatory Visit (HOSPITAL_COMMUNITY)
Admission: RE | Admit: 2022-08-17 | Discharge: 2022-08-17 | Disposition: A | Discharge status: Home / Self Care | Payer: Medicare HMO | Source: Ambulatory Visit | Attending: Cardiology | Admitting: Cardiology

## 2022-08-17 DIAGNOSIS — I7143 Infrarenal abdominal aortic aneurysm, without rupture: Secondary | ICD-10-CM | POA: Insufficient documentation

## 2022-08-18 ENCOUNTER — Telehealth: Payer: Self-pay | Admitting: *Deleted

## 2022-08-18 DIAGNOSIS — I7143 Infrarenal abdominal aortic aneurysm, without rupture: Secondary | ICD-10-CM

## 2022-08-18 DIAGNOSIS — F331 Major depressive disorder, recurrent, moderate: Secondary | ICD-10-CM | POA: Diagnosis not present

## 2022-08-18 NOTE — Telephone Encounter (Signed)
The patient has been notified of the result and verbalized understanding.  All questions (if any) were answered.  Pt aware I will go ahead and place the order for repeat AAA Duplex in the system, and have our [facility] Scheduling team call him back to arrange this appt for that time.   Pt verbalized understanding and agrees with this plan.

## 2022-08-18 NOTE — Telephone Encounter (Signed)
-----   Message from Freada Bergeron, MD sent at 08/18/2022  8:21 AM EDT ----- His AAA is stable in size at 3.7cm. Will continue with annual ultrasound for monitoring.

## 2022-08-20 DIAGNOSIS — M25561 Pain in right knee: Secondary | ICD-10-CM | POA: Diagnosis not present

## 2022-08-26 DIAGNOSIS — M25561 Pain in right knee: Secondary | ICD-10-CM | POA: Diagnosis not present

## 2022-08-27 DIAGNOSIS — M25561 Pain in right knee: Secondary | ICD-10-CM | POA: Diagnosis not present

## 2022-08-30 DIAGNOSIS — M25561 Pain in right knee: Secondary | ICD-10-CM | POA: Diagnosis not present

## 2022-09-01 DIAGNOSIS — M25561 Pain in right knee: Secondary | ICD-10-CM | POA: Diagnosis not present

## 2022-09-03 DIAGNOSIS — M25561 Pain in right knee: Secondary | ICD-10-CM | POA: Diagnosis not present

## 2022-09-06 DIAGNOSIS — M25561 Pain in right knee: Secondary | ICD-10-CM | POA: Diagnosis not present

## 2022-09-08 ENCOUNTER — Encounter: Payer: Self-pay | Admitting: Cardiology

## 2022-09-09 ENCOUNTER — Telehealth: Payer: Self-pay | Admitting: *Deleted

## 2022-09-09 DIAGNOSIS — M25561 Pain in right knee: Secondary | ICD-10-CM | POA: Diagnosis not present

## 2022-09-09 NOTE — Telephone Encounter (Signed)
   Patient Name: Dave Robles  DOB: 02-13-1946 MRN: [id number]  Primary Cardiologist: Freada Bergeron, MD  Chart reviewed as part of pre-operative protocol coverage.   Simple dental extractions (i.e. 1-2 teeth) are considered low risk procedures per guidelines and generally do not require any specific cardiac clearance. It is also generally accepted that for simple extractions and dental cleanings, there is no need to interrupt blood thinner therapy.   SBE prophylaxis is not required for the patient from a cardiac standpoint.  I will route this recommendation to the requesting party via Epic fax function and remove from pre-op pool.  Please call with questions.  Loel Dubonnet, NP 09/09/2022, 5:02 PM

## 2022-09-09 NOTE — Telephone Encounter (Signed)
   Pre-operative Risk Assessment    Patient Name: Dave Robles  DOB: Oct 29, 1946 MRN: [id number]     Request for Surgical Clearance    Procedure:  Dental Extraction - Amount of Teeth to be Pulled:  1 TOOTH EXTRACTION  Date of Surgery:  Clearance TBD                                 Surgeon:  DR. Donnal Moat & DR. Hubbard Hartshorn Surgeon's Group or Practice Name:  [facility] Phone number:  [phone] Fax number:  [phone]   Type of Clearance Requested:   - Medical  - Pharmacy:  Hold Clopidogrel (Plavix)     Type of Anesthesia:  Local    Additional requests/questions:    [name]   09/09/2022, 4:57 PM

## 2022-09-13 DIAGNOSIS — M25561 Pain in right knee: Secondary | ICD-10-CM | POA: Diagnosis not present

## 2022-09-20 DIAGNOSIS — F331 Major depressive disorder, recurrent, moderate: Secondary | ICD-10-CM | POA: Diagnosis not present

## 2022-09-22 DIAGNOSIS — M25561 Pain in right knee: Secondary | ICD-10-CM | POA: Diagnosis not present

## 2022-09-23 ENCOUNTER — Telehealth: Payer: Self-pay | Admitting: Cardiology

## 2022-09-23 NOTE — Telephone Encounter (Signed)
Please notify requesting provider that if > 2 teeth are being extracted, and the dentist prefers that patient not be on antiplatelet therapy (clopidogrel), they will need to submit a new request for cardiologists' agreement to hold.

## 2022-09-23 NOTE — Telephone Encounter (Signed)
I will send this to our pre op pool for help answer the question the requesting office has.

## 2022-09-23 NOTE — Telephone Encounter (Signed)
F/u Clearance 09/09/24 (Newest Message First) View All Conversations on this Encounter [place], Lanice Schwab, NP routed conversation to Cv Div Preop Callback46 minutes ago (3:43 PM)   Ann Maki, Lanice Schwab, NP46 minutes ago (3:43 PM)    Please notify requesting provider that if > 2 teeth are being extracted, and the dentist prefers that patient not be on antiplatelet therapy (clopidogrel), they will need to submit a new request for cardiologists' agreement to hold.       Note   You routed conversation to Cv Div Preop53 minutes ago (3:36 PM)   You53 minutes ago (3:35 PM)    I will send this to our pre op pool for help answer the question the requesting office has.       Note   Hammer, Angeline S routed conversation to Cv Div Preop Callback2 hours ago (1:41 PM)   Hammer, Angeline S2 hours ago (1:41 PM)   AH     Ricardo with [facility] calling to ask if they can get a clearance if the procedure ends up not a simple extractions. If pt can still take his medications normally       Note   [place]  Selinda Orion

## 2022-09-23 NOTE — Telephone Encounter (Signed)
   Allena Katz with [facility] calling to ask if they can get a clearance if the procedure ends up not a simple extractions. If pt can still take his medications normally

## 2022-09-24 DIAGNOSIS — M25561 Pain in right knee: Secondary | ICD-10-CM | POA: Diagnosis not present

## 2022-09-27 ENCOUNTER — Other Ambulatory Visit: Payer: Self-pay

## 2022-09-27 ENCOUNTER — Encounter: Payer: Self-pay | Admitting: Cardiology

## 2022-09-27 DIAGNOSIS — E7800 Pure hypercholesterolemia, unspecified: Secondary | ICD-10-CM

## 2022-09-27 DIAGNOSIS — I714 Abdominal aortic aneurysm, without rupture, unspecified: Secondary | ICD-10-CM

## 2022-09-27 MED ORDER — CLOPIDOGREL BISULFATE 75 MG PO TABS: 75.0000 mg | ORAL_TABLET | Freq: Every day | ORAL | 3 refills | Status: DC

## 2022-09-27 MED ORDER — LISINOPRIL 20 MG PO TABS
20.0000 mg | ORAL_TABLET | Freq: Every day | ORAL | 3 refills | Status: DC
Start: 2022-09-27 — End: 2022-11-29

## 2022-09-27 MED ORDER — ATORVASTATIN CALCIUM 80 MG PO TABS: ORAL_TABLET | ORAL | 3 refills | Status: DC

## 2022-09-27 MED ORDER — LISINOPRIL 20 MG PO TABS
20.0000 mg | ORAL_TABLET | Freq: Every day | ORAL | 3 refills | Status: DC
Start: 2022-09-27 — End: 2022-09-27

## 2022-09-27 NOTE — Telephone Encounter (Signed)
Pt last seen 08/09/22 by Johney Frame:  #CAD s/p CABG with re-do CABG in 05/2020: Initial CABG in 1991 with LIMA to LAD, SVG to DX and SVG to distal RCA and re-do CABG in 05/2020 with radial artery to PDA and RIMA to OM. TTE pre-bypass with LVEF 60-65%, no WMA. TTE post bypass 02/2021 with EF 60-65, no RWMA, mild LVH, GR 1 DD, normal RVSF, moderate LAE, mild AV sclerosis without stenosis. Currently doing well with no anginal symptoms. -Continue plavix '75mg'$  daily -Continue metop succinate '25mg'$  XL -Continue lisinopril '20mg'$  daily -Continue lipitor '80mg'$  daily  Will send refills to pharmacy per request.

## 2022-09-28 DIAGNOSIS — M25561 Pain in right knee: Secondary | ICD-10-CM | POA: Diagnosis not present

## 2022-09-30 DIAGNOSIS — M25551 Pain in right hip: Secondary | ICD-10-CM | POA: Diagnosis not present

## 2022-09-30 DIAGNOSIS — M7061 Trochanteric bursitis, right hip: Secondary | ICD-10-CM | POA: Diagnosis not present

## 2022-10-06 DIAGNOSIS — H401130 Primary open-angle glaucoma, bilateral, stage unspecified: Secondary | ICD-10-CM | POA: Diagnosis not present

## 2022-10-06 DIAGNOSIS — M25561 Pain in right knee: Secondary | ICD-10-CM | POA: Diagnosis not present

## 2022-10-13 DIAGNOSIS — M25561 Pain in right knee: Secondary | ICD-10-CM | POA: Diagnosis not present

## 2022-10-15 DIAGNOSIS — M25561 Pain in right knee: Secondary | ICD-10-CM | POA: Diagnosis not present

## 2022-10-18 ENCOUNTER — Other Ambulatory Visit: Payer: Self-pay | Admitting: Physician Assistant

## 2022-10-18 DIAGNOSIS — I7143 Infrarenal abdominal aortic aneurysm, without rupture: Secondary | ICD-10-CM | POA: Diagnosis not present

## 2022-10-18 DIAGNOSIS — I2510 Atherosclerotic heart disease of native coronary artery without angina pectoris: Secondary | ICD-10-CM | POA: Diagnosis not present

## 2022-10-18 DIAGNOSIS — I119 Hypertensive heart disease without heart failure: Secondary | ICD-10-CM | POA: Diagnosis not present

## 2022-10-18 DIAGNOSIS — E782 Mixed hyperlipidemia: Secondary | ICD-10-CM | POA: Diagnosis not present

## 2022-10-18 DIAGNOSIS — Z951 Presence of aortocoronary bypass graft: Secondary | ICD-10-CM | POA: Diagnosis not present

## 2022-10-18 DIAGNOSIS — M25561 Pain in right knee: Secondary | ICD-10-CM | POA: Diagnosis not present

## 2022-10-19 DIAGNOSIS — F331 Major depressive disorder, recurrent, moderate: Secondary | ICD-10-CM | POA: Diagnosis not present

## 2022-10-20 DIAGNOSIS — M25561 Pain in right knee: Secondary | ICD-10-CM | POA: Diagnosis not present

## 2022-10-27 DIAGNOSIS — M25561 Pain in right knee: Secondary | ICD-10-CM | POA: Diagnosis not present

## 2022-10-29 ENCOUNTER — Other Ambulatory Visit: Payer: Self-pay

## 2022-10-29 DIAGNOSIS — M25561 Pain in right knee: Secondary | ICD-10-CM | POA: Diagnosis not present

## 2022-10-29 DIAGNOSIS — I2510 Atherosclerotic heart disease of native coronary artery without angina pectoris: Secondary | ICD-10-CM

## 2022-10-29 MED ORDER — METOPROLOL SUCCINATE ER 25 MG PO TB24: 25.0000 mg | ORAL_TABLET | Freq: Every day | ORAL | 1 refills | Status: DC

## 2022-11-01 DIAGNOSIS — M25561 Pain in right knee: Secondary | ICD-10-CM | POA: Diagnosis not present

## 2022-11-03 DIAGNOSIS — M25561 Pain in right knee: Secondary | ICD-10-CM | POA: Diagnosis not present

## 2022-11-09 DIAGNOSIS — M25561 Pain in right knee: Secondary | ICD-10-CM | POA: Diagnosis not present

## 2022-11-29 ENCOUNTER — Other Ambulatory Visit: Payer: Self-pay

## 2022-11-29 DIAGNOSIS — E7800 Pure hypercholesterolemia, unspecified: Secondary | ICD-10-CM

## 2022-11-29 DIAGNOSIS — I714 Abdominal aortic aneurysm, without rupture, unspecified: Secondary | ICD-10-CM

## 2022-11-29 MED ORDER — CLOPIDOGREL BISULFATE 75 MG PO TABS: 75.0000 mg | ORAL_TABLET | Freq: Every day | ORAL | 3 refills | Status: DC

## 2022-11-29 MED ORDER — ATORVASTATIN CALCIUM 80 MG PO TABS: ORAL_TABLET | ORAL | 3 refills | Status: DC

## 2022-11-29 MED ORDER — LISINOPRIL 20 MG PO TABS: 20.0000 mg | ORAL_TABLET | Freq: Every day | ORAL | 3 refills | Status: DC

## 2022-12-01 ENCOUNTER — Other Ambulatory Visit: Payer: Self-pay

## 2022-12-01 DIAGNOSIS — F339 Major depressive disorder, recurrent, unspecified: Secondary | ICD-10-CM

## 2022-12-01 MED ORDER — BUPROPION HCL ER (XL) 300 MG PO TB24: 300.0000 mg | ORAL_TABLET | Freq: Every day | ORAL | 1 refills | Status: DC

## 2022-12-07 ENCOUNTER — Telehealth: Payer: Self-pay | Admitting: Adult Health

## 2022-12-07 NOTE — Telephone Encounter (Signed)
Spoke with patient to schedule Medicare Annual Wellness Visit (AWV) either virtually or in office.   my Dave Robles number [phone]  He stated he would call back to schedule    Last AWV 01/04/22 please schedule with Nurse Health Adviser   45 min for awv-i  in office appointments 30 min for awv-s & awv-i phone/virtual appointments

## 2022-12-08 ENCOUNTER — Encounter: Payer: Self-pay | Admitting: Cardiology

## 2022-12-08 ENCOUNTER — Encounter: Payer: Self-pay | Admitting: Adult Health

## 2022-12-08 DIAGNOSIS — I2510 Atherosclerotic heart disease of native coronary artery without angina pectoris: Secondary | ICD-10-CM

## 2022-12-08 DIAGNOSIS — Z951 Presence of aortocoronary bypass graft: Secondary | ICD-10-CM

## 2022-12-08 DIAGNOSIS — I714 Abdominal aortic aneurysm, without rupture, unspecified: Secondary | ICD-10-CM

## 2022-12-08 DIAGNOSIS — I10 Essential (primary) hypertension: Secondary | ICD-10-CM

## 2022-12-08 DIAGNOSIS — E7800 Pure hypercholesterolemia, unspecified: Secondary | ICD-10-CM

## 2022-12-08 DIAGNOSIS — E782 Mixed hyperlipidemia: Secondary | ICD-10-CM

## 2022-12-08 DIAGNOSIS — Z79899 Other long term (current) drug therapy: Secondary | ICD-10-CM

## 2022-12-09 ENCOUNTER — Encounter: Payer: Self-pay | Admitting: Adult Health

## 2022-12-09 MED ORDER — CLOPIDOGREL BISULFATE 75 MG PO TABS: 75.0000 mg | ORAL_TABLET | Freq: Every day | ORAL | 3 refills | Status: DC

## 2022-12-09 MED ORDER — ATORVASTATIN CALCIUM 80 MG PO TABS: ORAL_TABLET | ORAL | 3 refills | Status: DC

## 2022-12-09 MED ORDER — EZETIMIBE 10 MG PO TABS: 10.0000 mg | ORAL_TABLET | Freq: Every day | ORAL | 3 refills | Status: DC

## 2022-12-09 MED ORDER — LISINOPRIL 20 MG PO TABS: 20.0000 mg | ORAL_TABLET | Freq: Every day | ORAL | 3 refills | Status: DC

## 2022-12-09 MED ORDER — HYDROCHLOROTHIAZIDE 25 MG PO TABS: 12.5000 mg | ORAL_TABLET | Freq: Every day | ORAL | 3 refills | Status: DC

## 2022-12-09 MED ORDER — METOPROLOL SUCCINATE ER 25 MG PO TB24: 25.0000 mg | ORAL_TABLET | Freq: Every day | ORAL | 3 refills | Status: DC

## 2022-12-09 MED ORDER — FUROSEMIDE 20 MG PO TABS: ORAL_TABLET | ORAL | 1 refills | Status: DC

## 2022-12-09 NOTE — Telephone Encounter (Signed)
Please advise 

## 2022-12-20 ENCOUNTER — Encounter: Payer: Self-pay | Admitting: Adult Health

## 2022-12-21 ENCOUNTER — Other Ambulatory Visit: Payer: Self-pay | Admitting: Adult Health

## 2022-12-21 DIAGNOSIS — F339 Major depressive disorder, recurrent, unspecified: Secondary | ICD-10-CM

## 2022-12-23 DIAGNOSIS — Z808 Family history of malignant neoplasm of other organs or systems: Secondary | ICD-10-CM | POA: Diagnosis not present

## 2022-12-23 DIAGNOSIS — Z85828 Personal history of other malignant neoplasm of skin: Secondary | ICD-10-CM | POA: Diagnosis not present

## 2022-12-23 DIAGNOSIS — D1722 Benign lipomatous neoplasm of skin and subcutaneous tissue of left arm: Secondary | ICD-10-CM | POA: Diagnosis not present

## 2022-12-23 DIAGNOSIS — D485 Neoplasm of uncertain behavior of skin: Secondary | ICD-10-CM | POA: Diagnosis not present

## 2022-12-23 DIAGNOSIS — B354 Tinea corporis: Secondary | ICD-10-CM | POA: Diagnosis not present

## 2022-12-23 DIAGNOSIS — D1721 Benign lipomatous neoplasm of skin and subcutaneous tissue of right arm: Secondary | ICD-10-CM | POA: Diagnosis not present

## 2022-12-23 DIAGNOSIS — L578 Other skin changes due to chronic exposure to nonionizing radiation: Secondary | ICD-10-CM | POA: Diagnosis not present

## 2022-12-23 DIAGNOSIS — C44329 Squamous cell carcinoma of skin of other parts of face: Secondary | ICD-10-CM | POA: Diagnosis not present

## 2022-12-23 DIAGNOSIS — D225 Melanocytic nevi of trunk: Secondary | ICD-10-CM | POA: Diagnosis not present

## 2022-12-23 DIAGNOSIS — L570 Actinic keratosis: Secondary | ICD-10-CM | POA: Diagnosis not present

## 2022-12-23 DIAGNOSIS — L821 Other seborrheic keratosis: Secondary | ICD-10-CM | POA: Diagnosis not present

## 2022-12-23 DIAGNOSIS — D2239 Melanocytic nevi of other parts of face: Secondary | ICD-10-CM | POA: Diagnosis not present

## 2022-12-23 DIAGNOSIS — Z86018 Personal history of other benign neoplasm: Secondary | ICD-10-CM | POA: Diagnosis not present

## 2022-12-27 ENCOUNTER — Telehealth: Payer: Self-pay

## 2022-12-27 NOTE — Patient Instructions (Signed)
Visit Information  Thank you for taking time to visit with me today. Please don't hesitate to contact me if I can be of assistance to you.   Following are the goals we discussed today:   Goals Addressed             This Visit's Progress    COMPLETED: Care Coordination Activities-No follow up required       Interventions Today    Flowsheet Row Most Recent Value  General Interventions   General Interventions Discussed/Reviewed General Interventions Discussed, Doctor Visits  Doctor Visits Discussed/Reviewed Annual Wellness Visits               If you are experiencing a Mental Health or [place] or need someone to talk to, please call the Suicide and Crisis Lifeline: 988   Patient verbalizes understanding of instructions and care plan provided today and agrees to view in [facility] [place]. Active MyChart status and patient understanding of how to access instructions and care plan via MyChart confirmed with patient.     No further follow up required: decline  Jone Baseman, RN, MSN [facility] Care Management Coordinator Direct Line [phone]

## 2022-12-27 NOTE — Patient Outreach (Signed)
  Care Coordination   Initial Visit Note   12/27/2022 Name: Dave Robles MRN: [id number] DOB: 07/27/1946  Dave Robles is a 77 y.o. year old male who sees Nafziger, Tommi Rumps, NP for primary care. I spoke with  [name] by phone today.  What matters to the patients health and wellness today?  none    Goals Addressed             This Visit's Progress    COMPLETED: Care Coordination Activities-No follow up required       Interventions Today    Flowsheet Row Most Recent Value  General Interventions   General Interventions Discussed/Reviewed General Interventions Discussed, Doctor Visits  Doctor Visits Discussed/Reviewed Annual Wellness Visits              SDOH assessments and interventions completed:  Yes  SDOH Interventions Today    Flowsheet Row Most Recent Value  SDOH Interventions   Food Insecurity Interventions Intervention Not Indicated        Care Coordination Interventions:  Yes, provided   Follow up plan: No further intervention required.   Encounter Outcome:  Pt. Visit Completed   Jone Baseman, RN, MSN [facility] Care Management Coordinator Direct Line [phone]

## 2022-12-30 ENCOUNTER — Other Ambulatory Visit: Payer: Self-pay | Admitting: Adult Health

## 2022-12-30 DIAGNOSIS — F5101 Primary insomnia: Secondary | ICD-10-CM

## 2022-12-30 MED ORDER — TRAZODONE HCL 150 MG PO TABS: 150.0000 mg | ORAL_TABLET | Freq: Every day | ORAL | 1 refills | Status: DC

## 2023-01-04 ENCOUNTER — Other Ambulatory Visit: Payer: Self-pay | Admitting: Adult Health

## 2023-01-04 DIAGNOSIS — F339 Major depressive disorder, recurrent, unspecified: Secondary | ICD-10-CM

## 2023-01-04 MED ORDER — BUPROPION HCL ER (XL) 300 MG PO TB24: 300.0000 mg | ORAL_TABLET | Freq: Every day | ORAL | 1 refills | Status: DC

## 2023-01-20 DIAGNOSIS — R0989 Other specified symptoms and signs involving the circulatory and respiratory systems: Secondary | ICD-10-CM | POA: Diagnosis not present

## 2023-01-20 DIAGNOSIS — J40 Bronchitis, not specified as acute or chronic: Secondary | ICD-10-CM | POA: Diagnosis not present

## 2023-01-20 DIAGNOSIS — J948 Other specified pleural conditions: Secondary | ICD-10-CM | POA: Diagnosis not present

## 2023-01-20 DIAGNOSIS — J8410 Pulmonary fibrosis, unspecified: Secondary | ICD-10-CM | POA: Diagnosis not present

## 2023-01-21 DIAGNOSIS — Z85828 Personal history of other malignant neoplasm of skin: Secondary | ICD-10-CM | POA: Diagnosis not present

## 2023-01-21 DIAGNOSIS — L578 Other skin changes due to chronic exposure to nonionizing radiation: Secondary | ICD-10-CM | POA: Diagnosis not present

## 2023-01-21 DIAGNOSIS — D485 Neoplasm of uncertain behavior of skin: Secondary | ICD-10-CM | POA: Diagnosis not present

## 2023-02-17 ENCOUNTER — Other Ambulatory Visit: Payer: Self-pay | Admitting: *Deleted

## 2023-02-17 DIAGNOSIS — I2510 Atherosclerotic heart disease of native coronary artery without angina pectoris: Secondary | ICD-10-CM

## 2023-02-21 ENCOUNTER — Other Ambulatory Visit: Payer: Self-pay | Admitting: *Deleted

## 2023-02-21 DIAGNOSIS — I2510 Atherosclerotic heart disease of native coronary artery without angina pectoris: Secondary | ICD-10-CM

## 2023-02-22 ENCOUNTER — Encounter: Payer: Self-pay | Admitting: *Deleted

## 2023-03-08 ENCOUNTER — Other Ambulatory Visit: Payer: Self-pay

## 2023-03-08 DIAGNOSIS — I2510 Atherosclerotic heart disease of native coronary artery without angina pectoris: Secondary | ICD-10-CM

## 2023-03-08 MED ORDER — FUROSEMIDE 20 MG PO TABS: ORAL_TABLET | ORAL | 1 refills | Status: AC

## 2023-03-11 ENCOUNTER — Ambulatory Visit (INDEPENDENT_AMBULATORY_CARE_PROVIDER_SITE_OTHER): Payer: Medicare Other

## 2023-03-11 VITALS — Ht 70.0 in | Wt 215.0 lb

## 2023-03-11 DIAGNOSIS — Z0000 Encounter for general adult medical examination without abnormal findings: Secondary | ICD-10-CM

## 2023-03-11 DIAGNOSIS — C4442 Squamous cell carcinoma of skin of scalp and neck: Secondary | ICD-10-CM | POA: Diagnosis not present

## 2023-03-11 DIAGNOSIS — L988 Other specified disorders of the skin and subcutaneous tissue: Secondary | ICD-10-CM | POA: Diagnosis not present

## 2023-03-11 NOTE — Patient Instructions (Addendum)
[name] , Thank you for taking time to come for your Medicare Wellness Visit. I appreciate your ongoing commitment to your health goals. Please review the following plan we discussed and let me know if I can assist you in the future.   These are the goals we discussed:  Goals       No current goal (pt-stated)      Patient Stated      01/04/2022, lose weight      Weight (lb) < 200 lb (90.7 kg)        This is a list of the screening recommended for you and due dates:  Health Maintenance  Topic Date Due   COVID-19 Vaccine (5 - 2023-24 season) 03/27/2023*   Flu Shot  06/16/2023   Medicare Annual Wellness Visit  03/10/2024   Colon Cancer Screening  09/11/2028   DTaP/Tdap/Td vaccine (4 - Td or Tdap) 06/24/2032   Pneumonia Vaccine  Completed   Hepatitis C Screening: USPSTF Recommendation to screen - Ages 43-79 yo.  Completed   Zoster (Shingles) Vaccine  Completed   HPV Vaccine  Aged Out  *Topic was postponed. The date shown is not the original due date.    Advanced directives: Please bring a copy of your health care power of attorney and living will to the office to be added to your chart at your convenience.   Conditions/risks identified: None  Next appointment: Follow up in one year for your annual wellness visit.    Preventive Care 76 Years and Older, Male  Preventive care refers to lifestyle choices and visits with your health care provider that can promote health and wellness. What does preventive care include? A yearly physical exam. This is also called an annual well check. Dental exams once or twice a year. Routine eye exams. Ask your health care provider how often you should have your eyes checked. Personal lifestyle choices, including: Daily care of your teeth and gums. Regular physical activity. Eating a healthy diet. Avoiding tobacco and drug use. Limiting alcohol use. Practicing safe sex. Taking low doses of aspirin every day. Taking vitamin and mineral  supplements as recommended by your health care provider. What happens during an annual well check? The services and screenings done by your health care provider during your annual well check will depend on your age, overall health, lifestyle risk factors, and family history of disease. Counseling  Your health care provider may ask you questions about your: Alcohol use. Tobacco use. Drug use. Emotional well-being. Home and relationship well-being. Sexual activity. Eating habits. History of falls. Memory and ability to understand (cognition). Work and work Astronomer. Screening  You may have the following tests or measurements: Height, weight, and BMI. Blood pressure. Lipid and cholesterol levels. These may be checked every 5 years, or more frequently if you are over 23 years old. Skin check. Lung cancer screening. You may have this screening every year starting at age 76 if you have a 30-pack-year history of smoking and currently smoke or have quit within the past 15 years. Fecal occult blood test (FOBT) of the stool. You may have this test every year starting at age 76. Flexible sigmoidoscopy or colonoscopy. You may have a sigmoidoscopy every 5 years or a colonoscopy every 10 years starting at age 76. Prostate cancer screening. Recommendations will vary depending on your family history and other risks. Hepatitis C blood test. Hepatitis B blood test. Sexually transmitted disease (STD) testing. Diabetes screening. This is done by checking your blood  sugar (glucose) after you have not eaten for a while (fasting). You may have this done every 1-3 years. Abdominal aortic aneurysm (AAA) screening. You may need this if you are a current or former smoker. Osteoporosis. You may be screened starting at age 76 if you are at high risk. Talk with your health care provider about your test results, treatment options, and if necessary, the need for more tests. Vaccines  Your health care provider  may recommend certain vaccines, such as: Influenza vaccine. This is recommended every year. Tetanus, diphtheria, and acellular pertussis (Tdap, Td) vaccine. You may need a Td booster every 10 years. Zoster vaccine. You may need this after age 42. Pneumococcal 13-valent conjugate (PCV13) vaccine. One dose is recommended after age 76. Pneumococcal polysaccharide (PPSV23) vaccine. One dose is recommended after age 76. Talk to your health care provider about which screenings and vaccines you need and how often you need them. This information is not intended to replace advice given to you by your health care provider. Make sure you discuss any questions you have with your health care provider. Document Released: 11/28/2015 Document Revised: 07/21/2016 Document Reviewed: 09/02/2015 Elsevier Interactive Patient Education  2017 [employer] Prevention in the Home Falls can cause injuries. They can happen to people of all ages. There are many things you can do to make your home safe and to help prevent falls. What can I do on the outside of my home? Regularly fix the edges of walkways and driveways and fix any cracks. Remove anything that might make you trip as you walk through a door, such as a raised step or threshold. Trim any bushes or trees on the path to your home. Use bright outdoor lighting. Clear any walking paths of anything that might make someone trip, such as rocks or tools. Regularly check to see if handrails are loose or broken. Make sure that both sides of any steps have handrails. Any raised decks and porches should have guardrails on the edges. Have any leaves, snow, or ice cleared regularly. Use sand or salt on walking paths during winter. Clean up any spills in your garage right away. This includes oil or grease spills. What can I do in the bathroom? Use night lights. Install grab bars by the toilet and in the tub and shower. Do not use towel bars as grab bars. Use  non-skid mats or decals in the tub or shower. If you need to sit down in the shower, use a plastic, non-slip stool. Keep the floor dry. Clean up any water that spills on the floor as soon as it happens. Remove soap buildup in the tub or shower regularly. Attach bath mats securely with double-sided non-slip rug tape. Do not have throw rugs and other things on the floor that can make you trip. What can I do in the bedroom? Use night lights. Make sure that you have a light by your bed that is easy to reach. Do not use any sheets or blankets that are too big for your bed. They should not hang down onto the floor. Have a firm chair that has side arms. You can use this for support while you get dressed. Do not have throw rugs and other things on the floor that can make you trip. What can I do in the kitchen? Clean up any spills right away. Avoid walking on wet floors. Keep items that you use a lot in easy-to-reach places. If you need to reach something above you,  use a strong step stool that has a grab bar. Keep electrical cords out of the way. Do not use floor polish or wax that makes floors slippery. If you must use wax, use non-skid floor wax. Do not have throw rugs and other things on the floor that can make you trip. What can I do with my stairs? Do not leave any items on the stairs. Make sure that there are handrails on both sides of the stairs and use them. Fix handrails that are broken or loose. Make sure that handrails are as long as the stairways. Check any carpeting to make sure that it is firmly attached to the stairs. Fix any carpet that is loose or worn. Avoid having throw rugs at the top or bottom of the stairs. If you do have throw rugs, attach them to the floor with carpet tape. Make sure that you have a light switch at the top of the stairs and the bottom of the stairs. If you do not have them, ask someone to add them for you. What else can I do to help prevent falls? Wear  shoes that: Do not have high heels. Have rubber bottoms. Are comfortable and fit you well. Are closed at the toe. Do not wear sandals. If you use a stepladder: Make sure that it is fully opened. Do not climb a closed stepladder. Make sure that both sides of the stepladder are locked into place. Ask someone to hold it for you, if possible. Clearly mark and make sure that you can see: Any grab bars or handrails. First and last steps. Where the edge of each step is. Use tools that help you move around (mobility aids) if they are needed. These include: Canes. Walkers. Scooters. Crutches. Turn on the lights when you go into a dark area. Replace any light bulbs as soon as they burn out. Set up your furniture so you have a clear path. Avoid moving your furniture around. If any of your floors are uneven, fix them. If there are any pets around you, be aware of where they are. Review your medicines with your doctor. Some medicines can make you feel dizzy. This can increase your chance of falling. Ask your doctor what other things that you can do to help prevent falls. This information is not intended to replace advice given to you by your health care provider. Make sure you discuss any questions you have with your health care provider. Document Released: 08/28/2009 Document Revised: 04/08/2016 Document Reviewed: 12/06/2014 Elsevier Interactive Patient Education  2017 [employer].
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
## 2023-03-14 ENCOUNTER — Ambulatory Visit: Payer: Medicare Other | Admitting: Cardiology

## 2023-03-15 ENCOUNTER — Ambulatory Visit (INDEPENDENT_AMBULATORY_CARE_PROVIDER_SITE_OTHER): Payer: Medicare Other | Admitting: Adult Health

## 2023-03-15 ENCOUNTER — Encounter: Payer: Self-pay | Admitting: Adult Health

## 2023-03-15 VITALS — BP 110/70 | HR 55 | Temp 98.0°F | Ht 68.0 in | Wt 217.0 lb

## 2023-03-15 DIAGNOSIS — I10 Essential (primary) hypertension: Secondary | ICD-10-CM

## 2023-03-15 DIAGNOSIS — Z0000 Encounter for general adult medical examination without abnormal findings: Secondary | ICD-10-CM

## 2023-03-15 DIAGNOSIS — E7800 Pure hypercholesterolemia, unspecified: Secondary | ICD-10-CM | POA: Diagnosis not present

## 2023-03-15 DIAGNOSIS — I2510 Atherosclerotic heart disease of native coronary artery without angina pectoris: Secondary | ICD-10-CM | POA: Diagnosis not present

## 2023-03-15 DIAGNOSIS — F5101 Primary insomnia: Secondary | ICD-10-CM

## 2023-03-15 DIAGNOSIS — Z125 Encounter for screening for malignant neoplasm of prostate: Secondary | ICD-10-CM | POA: Diagnosis not present

## 2023-03-15 DIAGNOSIS — F339 Major depressive disorder, recurrent, unspecified: Secondary | ICD-10-CM

## 2023-03-15 LAB — CBC
HCT: 47.1 % (ref 39.0–52.0)
Hemoglobin: 16 g/dL (ref 13.0–17.0)
MCHC: 33.9 g/dL (ref 30.0–36.0)
MCV: 92.4 fl (ref 78.0–100.0)
Platelets: 159 10*3/uL (ref 150.0–400.0)
RBC: 5.1 Mil/uL (ref 4.22–5.81)
RDW: 14.6 % (ref 11.5–15.5)
WBC: 8.1 10*3/uL (ref 4.0–10.5)

## 2023-03-15 LAB — COMPREHENSIVE METABOLIC PANEL
ALT: 16 U/L (ref 0–53)
AST: 17 U/L (ref 0–37)
Albumin: 3.8 g/dL (ref 3.5–5.2)
Alkaline Phosphatase: 71 U/L (ref 39–117)
BUN: 18 mg/dL (ref 6–23)
CO2: 31 mEq/L (ref 19–32)
Calcium: 9.6 mg/dL (ref 8.4–10.5)
Chloride: 101 mEq/L (ref 96–112)
Creatinine, Ser: 1.42 mg/dL (ref 0.40–1.50)
GFR: 48.25 mL/min — ABNORMAL LOW (ref >60.00)
Glucose, Bld: 89 mg/dL (ref 70–99)
Potassium: 3.9 mEq/L (ref 3.5–5.1)
Sodium: 140 mEq/L (ref 135–145)
Total Bilirubin: 1.1 mg/dL (ref 0.2–1.2)
Total Protein: 6.1 g/dL (ref 6.0–8.3)

## 2023-03-15 LAB — LIPID PANEL
Cholesterol: 78 mg/dL (ref 0–200)
HDL: 29.8 mg/dL — ABNORMAL LOW (ref >39.00)
LDL Cholesterol: 32 mg/dL (ref 0–99)
NonHDL: 47.84
Total CHOL/HDL Ratio: 3
Triglycerides: 77 mg/dL (ref 0.0–149.0)
VLDL: 15.4 mg/dL (ref 0.0–40.0)

## 2023-03-15 LAB — TSH: TSH: 2.75 u[IU]/mL (ref 0.35–5.50)

## 2023-03-15 LAB — PSA: PSA: 1.8 ng/mL (ref 0.10–4.00)

## 2023-03-15 NOTE — Progress Notes (Unsigned)
Cardiology Office Note:    Date:  03/16/2023   ID:  Dave Robles, DOB [birth year], MRN [id number]  PCP:  [name], NP   [facility]  Cardiologist:  Meriam Sprague, MD  Advanced Practice Provider:  No care team member to display Electrophysiologist:  None   Referring MD: [name], NP    History of Present Illness:    Dave Robles is a 77 y.o. male with a hx of CAD with original CABG x 3 with LIMA to LAD, SVG to DX and SVG to distal RCA in 1991 by Dr. Andrey Campanile, HTN, HLD, ED, past tobacco abuse, asthma, AAA (last measured in 10/2019 - 3.4 cm) and depression who presents to clinic for follow-up. Was previously followed by [name].   Per review of the record, the patient had a NSTEMI in 2016 while vacationing in [place].  Cath showed normal left main, 90% prox LAD, 99% mid LAD with patent LIMA to mid LAD and patent SVG to DX. LCX origin stenosis vs flow void? Fistula vs competitive flow in branch?, 100% RCA, SVG to RCA 99% origin treated with Xience DES and PTCA.   Patient has been closely followed by [name]. Was seen in 04/2020 with worsening dyspnea on exertion which is his anginal equivalent. Was referred for cardiac cath at that time which led to need for repeat CABG on 05/27/20. Specifically, he received a left radial artery to PDA, and a free right internal mammary artery to obtuse marginal.  Post-op course complicated by need for inotropic support and blood loss anemia requiring blood transfusion. Repeat TTE 03/04/21 with EF 60-65, no RWMA, mild LVH, GR 1 DD, normal RVSF, moderate LAE, mild AV sclerosis without stenosis.   Was last seen in clinic on 07/2022 where he was doing well from a CV standpoint.   Today, the patient overall feels well. States he thinks he was retaining some fluid and he took lasix with significant relief. No associated SOB, orthopnea, or PND. No chest pain, lightheadedness or syncope. No palpitations.  Tolerating medications as prescribed. Blood pressure has been well controlled.    Past Medical History:  Diagnosis Date   AAA (abdominal aortic aneurysm) (HCC)    AAA (abdominal aortic aneurysm) without rupture (HCC) 01/19/2017   Adjustment disorder with depressed mood 03/04/2009   Arthritis    Colon polyps    CORONARY ARTERY DISEASE 07/10/2007   Coronary artery disease involving native coronary artery of native heart without angina pectoris 07/10/2007   Qualifier: Diagnosis of  By: Tawanna Cooler, RN, Alvino Chapel     Depression    DOE (dyspnea on exertion)    Episodic mood disorder (HCC) 08/01/2014   ERECTILE DYSFUNCTION 07/10/2007   Extrinsic asthma, unspecified 09/10/2008   HYPERLIPIDEMIA 07/10/2007   HYPERTENSION 07/10/2007   Myocardial infarction (HCC) 2016   when had angioplasty- was told had a silent MI   OA (osteoarthritis) of hip 04/19/2018   Onychomycosis 05/14/2014   Pain in joint of left knee 01/19/2017   Personal history of colonic polyps 07/08/2014   1997, 2001 8 mm transverse serrated polyp right colon 2010 07/08/2014 no polyps - repeat colon 2022      S/P CABG x 2 05/27/2020   TOBACCO USE 12/15/2009    Past Surgical History:  Procedure Laterality Date   COLONOSCOPY     CORONARY ANGIOPLASTY  12/2014   CORONARY ARTERY BYPASS GRAFT  11/15/89   5 vessels   CORONARY ARTERY BYPASS GRAFT N/A 05/27/2020  Procedure: REDO CORONARY ARTERY BYPASS GRAFTING (CABG), ON PUMP, TIMES TWO, USING RIGHT INTERNAL MAMMARY ARTERY AND LEFT RADIAL ARTERY (OPEN);  Surgeon: Delight Ovens, MD;  Location: [facility] OR;  Service: Open Heart Surgery;  Laterality: N/A;  Left Radial Artery to PDA FREE RIMA to OM   HYDROCELE EXCISION / REPAIR  2011   INGUINAL HERNIA REPAIR Left 1985   LEFT HEART CATH AND CORS/GRAFTS ANGIOGRAPHY N/A 05/13/2020   Procedure: LEFT HEART CATH AND CORS/GRAFTS ANGIOGRAPHY;  Surgeon: Lyn Records, MD;  Location: [facility] INVASIVE CV LAB;  Service: Cardiovascular;  Laterality: N/A;   MEDIASTERNOTOMY N/A  05/27/2020   Procedure: MEDIAN STERNOTOMY;  Surgeon: Delight Ovens, MD;  Location: [facility] OR;  Service: Open Heart Surgery;  Laterality: N/A;  Redo Median Sternotomy   RADIAL ARTERY HARVEST Left 05/27/2020   Procedure: RADIAL ARTERY HARVEST;  Surgeon: Delight Ovens, MD;  Location: [facility] OR;  Service: Open Heart Surgery;  Laterality: Left;   TEE WITHOUT CARDIOVERSION N/A 05/27/2020   Procedure: TRANSESOPHAGEAL ECHOCARDIOGRAM (TEE);  Surgeon: Delight Ovens, MD;  Location: [facility] OR;  Service: Open Heart Surgery;  Laterality: N/A;   TOTAL HIP ARTHROPLASTY Right 04/19/2018   Procedure: RIGHT TOTAL HIP ARTHROPLASTY ANTERIOR APPROACH;  Surgeon: Ollen Gross, MD;  Location: [facility] ORS;  Service: Orthopedics;  Laterality: Right;    Current Medications: Current Meds  Medication Sig   acetaminophen (TYLENOL) 325 MG tablet Take 650 mg by mouth every 6 (six) hours as needed for moderate pain or headache.    atorvastatin (LIPITOR) 80 MG tablet TAKE 1 TABLET DAILY   azelastine (ASTELIN) 0.1 % nasal spray Place 1 spray into both nostrils 2 (two) times daily. Use in each nostril as directed   buPROPion (WELLBUTRIN XL) 300 MG 24 hr tablet Take 1 tablet (300 mg total) by mouth daily.   calcipotriene (DOVONOX) 0.005 % cream Apply 1 application topically 2 (two) times daily.   clobetasol cream (TEMOVATE) 0.05 % Apply 1 application topically daily.   clopidogrel (PLAVIX) 75 MG tablet Take 1 tablet (75 mg total) by mouth daily.   ezetimibe (ZETIA) 10 MG tablet Take 1 tablet (10 mg total) by mouth daily.   furosemide (LASIX) 20 MG tablet TAKE 1 TABLET DAILY AS     NEEDED FOR LEG SWELLING   hydrochlorothiazide (HYDRODIURIL) 25 MG tablet Take 0.5 tablets (12.5 mg total) by mouth daily.   lisinopril (ZESTRIL) 20 MG tablet Take 1 tablet (20 mg total) by mouth daily.   Melatonin 10 MG TABS Take 20 mg by mouth at bedtime.   metoprolol succinate (TOPROL-XL) 25 MG 24 hr tablet Take 1 tablet (25 mg total) by mouth daily.    Multiple Vitamin (MULTIVITAMIN) capsule Take 1 capsule by mouth daily.   nitroGLYCERIN (NITROSTAT) 0.4 MG SL tablet Place 1 tablet (0.4 mg total) under the tongue every 5 (five) minutes as needed.   sildenafil (VIAGRA) 100 MG tablet Take 100 mg by mouth daily as needed for erectile dysfunction.   traZODone (DESYREL) 150 MG tablet Take 1 tablet (150 mg total) by mouth at bedtime.     Allergies:   Patient has no known allergies.   Social History   Socioeconomic History   Marital status: Married    Spouse name: Not on file   Number of children: Not on file   Years of education: Not on file   Highest education level: Not on file  Occupational History   Not on file  Tobacco Use   Smoking  status: Former    Packs/day: .5    Types: Cigarettes    Quit date: 1991    Years since quitting: 33.3   Smokeless tobacco: Never  Vaping Use   Vaping Use: Never used  Substance and Sexual Activity   Alcohol use: Yes    Alcohol/week: 1.0 standard drink of alcohol    Types: 1 Cans of beer per week    Comment: Gin 2 x week   Drug use: No   Sexual activity: Yes    Comment: regular exercise - yes  Other Topics Concern   Not on file  Social History Narrative   Retired from being an Network engineer of a small business    Married    No kids       He likes to play golf and rides bike       Social Determinants of Corporate investment banker Strain: Low Risk  (03/11/2023)   Overall Financial Resource Strain (CARDIA)    Difficulty of Paying Living Expenses: Not hard at all  Food Insecurity: No Food Insecurity (03/11/2023)   Hunger Vital Sign    Worried About Running Out of Food in the Last Year: Never true    Ran Out of Food in the Last Year: Never true  Transportation Needs: No Transportation Needs (03/11/2023)   PRAPARE - Administrator, Civil Service (Medical): No    Lack of Transportation (Non-Medical): No  Physical Activity: Sufficiently Active (03/11/2023)   Exercise Vital Sign    Days  of Exercise per Week: 4 days    Minutes of Exercise per Session: 60 min  Stress: No Stress Concern Present (03/11/2023)   [employer] of Occupational Health - Occupational Stress Questionnaire    Feeling of Stress : Not at all  Social Connections: Moderately Isolated (03/11/2023)   Social Connection and Isolation Panel [NHANES]    Frequency of Communication with Friends and Family: More than three times a week    Frequency of Social Gatherings with Friends and Family: More than three times a week    Attends Religious Services: Never    Database administrator or Organizations: No    Attends Engineer, structural: Never    Marital Status: Married     Family History: The patient's family history includes Cancer in his mother; Coronary artery disease in his father; Depression in his mother and paternal grandfather; Hyperlipidemia in an other family member; Hypertension in an other family member. There is no history of Colon cancer.  ROS:   Review of Systems  Constitutional:  Negative for chills and fever.  HENT:  Negative for nosebleeds and tinnitus.   Eyes:  Negative for blurred vision and pain.  Respiratory:  Negative for cough, hemoptysis, shortness of breath and stridor.   Cardiovascular:  Positive for leg swelling. Negative for chest pain, palpitations, orthopnea, claudication and PND.  Gastrointestinal:  Negative for blood in stool, diarrhea, nausea and vomiting.  Genitourinary:  Negative for dysuria and hematuria.  Musculoskeletal:  Negative for falls.  Neurological:  Negative for dizziness, loss of consciousness and headaches.  Psychiatric/Behavioral:  Negative for depression, hallucinations and substance abuse. The patient does not have insomnia.        EKGs/Labs/Other Studies Reviewed:    The following studies were reviewed today:  Abdominal ultrasound 08/2022: Summary:  Abdominal Aorta: There is evidence of abnormal dilatation of the distal  Abdominal  aorta. The largest aortic measurement is 3.7 cm. Near ectatic  left common  iliac artery measuring 1.6 cm. The largest aortic diameter  remains essentially unchanged compared  to prior exam. Previous diameter measurement was 3.7 cm obtained on  10/20/2021.  Stenosis:  Widely patent bilateral common and external iliac arteries without  evidence of focal stenosis.   IVC/Iliac: Patent IVC.    *See table(s) above for measurements and observations.  Suggest follow up study in 12 months.   Abdominal Aorta US 10/20/21 Summary:  Abdominal Aorta: There is evidence of abnormal dilatation of the distal  Abdominal aorta. The largest aortic diameter remains essentially unchanged  compared to prior exam. Previous diameter measurement was 3.4 cm obtained  on 10/2019.  Stenosis: +------------------+-------------+----------------+  Location          Stenosis     Comments          +------------------+-------------+----------------+  Right Common Iliac<50% stenosis                  +------------------+-------------+----------------+  Left Common Iliac >50% stenosislow end of range  +------------------+-------------+----------------+     IVC/Iliac: There is no evidence of thrombus involving the IVC.   TTE 02/2021: IMPRESSIONS   1. Left ventricular ejection fraction, by estimation, is 60 to 65%. The left ventricle has normal function. The left ventricle has no regional wall motion abnormalities. There is mild concentric left ventricular hypertrophy. Left ventricular diastolic parameters are consistent with Grade I diastolic dysfunction (impaired relaxation).   2. Right ventricular systolic function is normal. The right ventricular size is normal.   3. Left atrial size was moderately dilated.   4. The mitral valve is normal in structure. No evidence of mitral valve regurgitation. No evidence of mitral stenosis.   5. The aortic valve is normal in structure. Aortic valve regurgitation is not  visualized. Mild aortic valve sclerosis is present, with no evidence of aortic valve stenosis.   6. The inferior vena cava is normal in size with greater than 50% respiratory variability, suggesting right atrial pressure of 3 mmHg.  Cath 05/13/20: Total occlusion of the mid LAD, total occlusion of the mid RCA, and high-grade calcified obstruction in the ostial to proximal circumflex with diffuse 50% left main stenosis. Patent saphenous vein graft to the diagonal with 70 to 80% stenosis distal to the graft insertion site.  The diagonal fills retrogradely into the first septal perforator and the proximal LAD segment. Diffuse 95% in-stent restenosis in the ostial to proximal saphenous vein graft to the distal RCA. Normal LV function.  LVEDP is normal.  EF is estimated to be 60%.   RECOMMENDATIONS:   Critical decision point for this patient who had coronary bypass surgery in 1991, and now has diffuse in-stent restenosis in the ostium and proximal segment of the saphenous vein graft to the right coronary.  The native circumflex is heavily calcified and has critical stenosis.  The diagonal beyond the saphenous vein graft insertion site has significant disease.  Long-term patency of the right coronary graft will be markedly reduced PCI and restenting.  The technical standpoint it appears that the stent extends outside the ostium of the bypass graft.  The right coronary territory is large.  The patient is relatively healthy.  His best long-term approach may be repeat coronary bypass surgery.  Before starting down the path of stent implantation, will  refer for heart team evaluation to consider surgical revascularization.  If deemed not a surgical candidate or if the patient prefers PCI, this could be attempted but will carry higher technical risk and likelihood  of repeat revascularization.     CTA CHEST IMPRESSION 09/2020: 1. Mild likely consistent with very mild colitis and/or diverticulitis involving the  mid to distal descending colon. 2. 3.5 cm x 3.2 cm focal aneurysmal dilatation of the infrarenal abdominal aorta. 3. Large right pleural effusion. 4. Mild-to-moderate severity right basilar atelectasis and/or infiltrate. 5. Total right hip replacement. 6. Aortic atherosclerosis.   Aortic Atherosclerosis (ICD10-I70.0).     OPERATIVE REPORT DATE OF PROCEDURE:  05/27/2020   NAME OF PROCEDURE:  Redo coronary artery bypass grafting with free right internal mammary artery to the obtuse marginal and left radial graft to the posterior descending coronary artery with open left radial harvesting   SURGEON:  Sheliah Plane, MD   FIRST ASSISTANT:  Gershon Crane, PA     ECHO IMPRESSIONS 05/2020   1. Left ventricular ejection fraction, by estimation, is 60 to 65%. The left ventricle has normal function. The left ventricle has no regional wall motion abnormalities. There is moderate left ventricular hypertrophy. Left ventricular diastolic parameters were normal.   2. Right ventricular systolic function is mildly reduced. The right ventricular size is normal. Tricuspid regurgitation signal is inadequate for assessing PA pressure.   3. Left atrial size was moderately dilated.   4. The mitral valve is normal in structure. Trivial mitral valve regurgitation.   5. The aortic valve is tricuspid. Aortic valve regurgitation is not visualized. Mild to moderate aortic valve sclerosis/calcification is present, without any evidence of aortic stenosis.   6. The inferior vena cava is normal in size with greater than 50% respiratory variability, suggesting right atrial pressure of 3 mmHg.     EKG:  EKG is personally reviewed. NSR with sinus arrhythmia, PVC, possible inferior infarct  Recent Labs: 03/15/2023: ALT 16; BUN 18; Creatinine, Ser 1.42; Hemoglobin 16.0; Platelets 159.0; Potassium 3.9; Sodium 140; TSH 2.75   Recent Lipid Panel    Component Value Date/Time   CHOL 78 03/15/2023 0820   CHOL 92 (L)  07/08/2022 0751   TRIG 77.0 03/15/2023 0820   HDL 29.80 (L) 03/15/2023 0820   HDL 32 (L) 07/08/2022 0751   CHOLHDL 3 03/15/2023 0820   VLDL 15.4 03/15/2023 0820   LDLCALC 32 03/15/2023 0820   LDLCALC 43 07/08/2022 0751   LDLDIRECT 66.7 11/30/2011 1326      Physical Exam:    VS:  BP 116/72   Pulse 70   Ht 5\' 10"  (1.778 m)   Wt 218 lb 6.4 oz (99.1 kg)   SpO2 94%   BMI 31.34 kg/m     Wt Readings from Last 3 Encounters:  03/16/23 218 lb 6.4 oz (99.1 kg)  03/15/23 217 lb (98.4 kg)  03/11/23 215 lb (97.5 kg)     GEN:  Well nourished, well developed in no acute distress HEENT: Normal NECK: No JVD; No carotid bruits CARDIAC: RRR, 2/6 systolic murmurs. No rubs or gallops RESPIRATORY:  Clear to auscultation without rales, wheezing or rhonchi  ABDOMEN: Soft, non-tender, non-distended MUSCULOSKELETAL:  No edema, no deformity SKIN: Warm and dry NEUROLOGIC:  Alert and oriented x 3 PSYCHIATRIC:  Normal affect   ASSESSMENT:    1. Coronary artery disease involving native coronary artery of native heart without angina pectoris   2. Hx of CABG   3. Essential hypertension   4. Mixed hyperlipidemia   5. Infrarenal abdominal aortic aneurysm (AAA) without rupture (HCC)      PLAN:    In order of problems listed above:  #CAD s/p CABG with re-do  CABG in 05/2020: Initial CABG in 1991 with LIMA to LAD, SVG to DX and SVG to distal RCA and re-do CABG in 05/2020 with radial artery to PDA and RIMA to OM. TTE pre-bypass with LVEF 60-65%, no WMA. TTE post bypass 02/2021 with EF 60-65, no RWMA, mild LVH, GR 1 DD, normal RVSF, moderate LAE, mild AV sclerosis without stenosis. Currently doing well with no anginal symptoms. -Continue plavix 75mg  daily -Continue metop succinate 25mg  XL -Continue lisinopril 20mg  daily -Continue lipitor 80mg  daily   #HTN: Well controlled and at goal <130/90. -Continue lisinopril 20mg  daily -Continue metop succinate 25mg  XL daily -Continue HCTZ 12.5mg  daily    #HLD: LDL better controlled at 32  (goal <55).  -Continue lipitor 80mg  daily -Continue zetia 10mg  daily  #AAA: Stable at 3.7cm in 08/2022 -Continue annual ultrasounds   Medication Adjustments/Labs and Tests Ordered: Current medicines are reviewed at length with the patient today.  Concerns regarding medicines are outlined above.   Orders Placed This Encounter  Procedures   EKG 12-Lead   No orders of the defined types were placed in this encounter.  Patient Instructions  Medication Instructions:  Your physician recommends that you continue on your current medications as directed. Please refer to the Current Medication list given to you today.  *If you need a refill on your cardiac medications before your next appointment, please call your pharmacy*   Lab Work: NONE If you have labs (blood work) drawn today and your tests are completely normal, you will receive your results only by: MyChart Message (if you have MyChart) OR A paper copy in the mail If you have any lab test that is abnormal or we need to change your treatment, we will call you to review the results.    Follow-Up: AS SCHEDULED          Signed, Meriam Sprague, MD 03/16/2023  9:29 AM [facility]

## 2023-03-15 NOTE — Progress Notes (Signed)
Subjective:    Patient ID: Dave Robles, male    DOB: Jan 11, 1946, 77 y.o.   MRN: [id number]  HPI Patient presents for yearly preventative medicine examination. He is a pleasant 77 year old male who  has a past medical history of AAA (abdominal aortic aneurysm) (HCC), AAA (abdominal aortic aneurysm) without rupture (HCC) (01/19/2017), Adjustment disorder with depressed mood (03/04/2009), Arthritis, Colon polyps, CORONARY ARTERY DISEASE (07/10/2007), Coronary artery disease involving native coronary artery of native heart without angina pectoris (07/10/2007), Depression, DOE (dyspnea on exertion), Episodic mood disorder (HCC) (08/01/2014), ERECTILE DYSFUNCTION (07/10/2007), Extrinsic asthma, unspecified (09/10/2008), HYPERLIPIDEMIA (07/10/2007), HYPERTENSION (07/10/2007), Myocardial infarction (HCC) (2016), OA (osteoarthritis) of hip (04/19/2018), Onychomycosis (05/14/2014), Pain in joint of left knee (01/19/2017), Personal history of colonic polyps (07/08/2014), S/P CABG x 2 (05/27/2020), and TOBACCO USE (12/15/2009).  CAD/hyperlipidemia-history of CABG x3 with LIMA to LAD, SVG to DX and SVG to distal RCA in 1991.  He had non-ST elevation MI while vacationing in [place] in 2016.  At this time was Noted to have normal left main, 90% proximal LAD, 99% mid LAD with patent LIMA to the mid LAD and patent SVG to DX. 2021 by cardiology due to DOE at which time he had a cardiac cath on 05/27/2020 he underwent a redo coronary to bypass grafting x2.  He is currently prescribed Lipitor 80 mg daily and Plavix 75 mg daily.  He is followed by cardiology on a routine basis.  Denies myalgia, fatigue, shortness of breath, or chest pain Lab Results  Component Value Date   CHOL 92 (L) 07/08/2022   HDL 32 (L) 07/08/2022   LDLCALC 43 07/08/2022   LDLDIRECT 66.7 11/30/2011   TRIG 84 07/08/2022   CHOLHDL 2.9 07/08/2022   Essential hypertension-managed with  lisinopril 20 mg, Lasix 20 mg every other day, and metoprolol 25 mg twice  daily. His cardiologist in [place] started him on HCTZ 12.5 mg for elevated blood pressure readings.  He denies dizziness, lightheadedness, chest pain, syncopal episodes. BP Readings from Last 3 Encounters:  03/15/23 110/70  08/09/22 122/64  05/25/22 110/64   Depression- Takes Wellbutrin 300 mg ER. He also sees a therapist and finds this helpful   Insomnia -prescribed trazodone 150 mg QHS. Feels as though he gets well rested sleep most night s  All immunizations and health maintenance protocols were reviewed with the patient and needed orders were placed.  Appropriate screening laboratory values were ordered for the patient including screening of hyperlipidemia, renal function and hepatic function. If indicated by BPH, a PSA was ordered.  Medication reconciliation,  past medical history, social history, problem list and allergies were reviewed in detail with the patient  Goals were established with regard to weight loss, exercise, and  diet in compliance with medications Wt Readings from Last 3 Encounters:  03/15/23 217 lb (98.4 kg)  03/11/23 215 lb (97.5 kg)  08/09/22 220 lb 9.6 oz (100.1 kg)   Review of Systems  Constitutional: Negative.   HENT: Negative.    Eyes: Negative.   Respiratory: Negative.    Cardiovascular: Negative.   Gastrointestinal: Negative.   Endocrine: Negative.   Genitourinary: Negative.   Musculoskeletal: Negative.   Skin: Negative.   Allergic/Immunologic: Negative.   Neurological: Negative.   Hematological: Negative.   Psychiatric/Behavioral:  Positive for dysphoric mood and sleep disturbance.   All other systems reviewed and are negative.  Past Medical History:  Diagnosis Date   AAA (abdominal aortic aneurysm) (HCC)    AAA (abdominal aortic  aneurysm) without rupture (HCC) 01/19/2017   Adjustment disorder with depressed mood 03/04/2009   Arthritis    Colon polyps    CORONARY ARTERY DISEASE 07/10/2007   Coronary artery disease involving native coronary  artery of native heart without angina pectoris 07/10/2007   Qualifier: Diagnosis of  By: Tawanna Cooler, RN, Alvino Chapel     Depression    DOE (dyspnea on exertion)    Episodic mood disorder (HCC) 08/01/2014   ERECTILE DYSFUNCTION 07/10/2007   Extrinsic asthma, unspecified 09/10/2008   HYPERLIPIDEMIA 07/10/2007   HYPERTENSION 07/10/2007   Myocardial infarction (HCC) 2016   when had angioplasty- was told had a silent MI   OA (osteoarthritis) of hip 04/19/2018   Onychomycosis 05/14/2014   Pain in joint of left knee 01/19/2017   Personal history of colonic polyps 07/08/2014   1997, 2001 8 mm transverse serrated polyp right colon 2010 07/08/2014 no polyps - repeat colon 2022      S/P CABG x 2 05/27/2020   TOBACCO USE 12/15/2009    Social History   Socioeconomic History   Marital status: Married    Spouse name: Not on file   Number of children: Not on file   Years of education: Not on file   Highest education level: Not on file  Occupational History   Not on file  Tobacco Use   Smoking status: Former    Packs/day: .5    Types: Cigarettes    Quit date: 1991    Years since quitting: 33.3   Smokeless tobacco: Never  Vaping Use   Vaping Use: Never used  Substance and Sexual Activity   Alcohol use: Yes    Alcohol/week: 1.0 standard drink of alcohol    Types: 1 Cans of beer per week    Comment: Gin 2 x week   Drug use: No   Sexual activity: Yes    Comment: regular exercise - yes  Other Topics Concern   Not on file  Social History Narrative   Retired from being an Network engineer of a small business    Married    No kids       He likes to play golf and rides bike       Social Determinants of Corporate investment banker Strain: Low Risk  (03/11/2023)   Overall Financial Resource Strain (CARDIA)    Difficulty of Paying Living Expenses: Not hard at all  Food Insecurity: No Food Insecurity (03/11/2023)   Hunger Vital Sign    Worried About Running Out of Food in the Last Year: Never true    Ran Out of Food in  the Last Year: Never true  Transportation Needs: No Transportation Needs (03/11/2023)   PRAPARE - Administrator, Civil Service (Medical): No    Lack of Transportation (Non-Medical): No  Physical Activity: Sufficiently Active (03/11/2023)   Exercise Vital Sign    Days of Exercise per Week: 4 days    Minutes of Exercise per Session: 60 min  Stress: No Stress Concern Present (03/11/2023)   [employer] of Occupational Health - Occupational Stress Questionnaire    Feeling of Stress : Not at all  Social Connections: Moderately Isolated (03/11/2023)   Social Connection and Isolation Panel [NHANES]    Frequency of Communication with Friends and Family: More than three times a week    Frequency of Social Gatherings with Friends and Family: More than three times a week    Attends Religious Services: Never    Active Member of  Clubs or Organizations: No    Attends Banker Meetings: Never    Marital Status: Married  Catering manager Violence: Not At Risk (03/11/2023)   Humiliation, Afraid, Rape, and Kick questionnaire    Fear of Current or Ex-Partner: No    Emotionally Abused: No    Physically Abused: No    Sexually Abused: No    Past Surgical History:  Procedure Laterality Date   COLONOSCOPY     CORONARY ANGIOPLASTY  12/2014   CORONARY ARTERY BYPASS GRAFT  11/15/89   5 vessels   CORONARY ARTERY BYPASS GRAFT N/A 05/27/2020   Procedure: REDO CORONARY ARTERY BYPASS GRAFTING (CABG), ON PUMP, TIMES TWO, USING RIGHT INTERNAL MAMMARY ARTERY AND LEFT RADIAL ARTERY (OPEN);  Surgeon: Delight Ovens, MD;  Location: [facility] OR;  Service: Open Heart Surgery;  Laterality: N/A;  Left Radial Artery to PDA FREE RIMA to OM   HYDROCELE EXCISION / REPAIR  2011   INGUINAL HERNIA REPAIR Left 1985   LEFT HEART CATH AND CORS/GRAFTS ANGIOGRAPHY N/A 05/13/2020   Procedure: LEFT HEART CATH AND CORS/GRAFTS ANGIOGRAPHY;  Surgeon: Lyn Records, MD;  Location: [facility] INVASIVE [facility] LAB;  Service:  Cardiovascular;  Laterality: N/A;   MEDIASTERNOTOMY N/A 05/27/2020   Procedure: MEDIAN STERNOTOMY;  Surgeon: Delight Ovens, MD;  Location: [facility] OR;  Service: Open Heart Surgery;  Laterality: N/A;  Redo Median Sternotomy   RADIAL ARTERY HARVEST Left 05/27/2020   Procedure: RADIAL ARTERY HARVEST;  Surgeon: Delight Ovens, MD;  Location: [facility] OR;  Service: Open Heart Surgery;  Laterality: Left;   TEE WITHOUT CARDIOVERSION N/A 05/27/2020   Procedure: TRANSESOPHAGEAL ECHOCARDIOGRAM (TEE);  Surgeon: Delight Ovens, MD;  Location: [facility] OR;  Service: Open Heart Surgery;  Laterality: N/A;   TOTAL HIP ARTHROPLASTY Right 04/19/2018   Procedure: RIGHT TOTAL HIP ARTHROPLASTY ANTERIOR APPROACH;  Surgeon: Ollen Gross, MD;  Location: [facility] ORS;  Service: Orthopedics;  Laterality: Right;    Family History  Problem Relation Age of Onset   Coronary artery disease Father        cabg   Cancer Mother        Breast and or lung    Depression Mother    Hyperlipidemia Other    Hypertension Other    Depression Paternal Grandfather    Colon cancer Neg Hx     No Known Allergies  Current Outpatient Medications on File Prior to Visit  Medication Sig Dispense Refill   acetaminophen (TYLENOL) 325 MG tablet Take 650 mg by mouth every 6 (six) hours as needed for moderate pain or headache.      atorvastatin (LIPITOR) 80 MG tablet TAKE 1 TABLET DAILY 90 tablet 3   azelastine (ASTELIN) 0.1 % nasal spray Place 1 spray into both nostrils 2 (two) times daily. Use in each nostril as directed 30 mL 12   buPROPion (WELLBUTRIN XL) 300 MG 24 hr tablet Take 1 tablet (300 mg total) by mouth daily. 90 tablet 1   calcipotriene (DOVONOX) 0.005 % cream Apply 1 application topically 2 (two) times daily.     clobetasol cream (TEMOVATE) 0.05 % Apply 1 application topically daily.     clopidogrel (PLAVIX) 75 MG tablet Take 1 tablet (75 mg total) by mouth daily. 90 tablet 3   ezetimibe (ZETIA) 10 MG tablet Take 1 tablet (10 mg total)  by mouth daily. 90 tablet 3   furosemide (LASIX) 20 MG tablet TAKE 1 TABLET DAILY AS     NEEDED FOR LEG SWELLING  90 tablet 1   hydrochlorothiazide (HYDRODIURIL) 25 MG tablet Take 0.5 tablets (12.5 mg total) by mouth daily. 45 tablet 3   lisinopril (ZESTRIL) 20 MG tablet Take 1 tablet (20 mg total) by mouth daily. 90 tablet 3   Melatonin 10 MG TABS Take 20 mg by mouth at bedtime.     metoprolol succinate (TOPROL-XL) 25 MG 24 hr tablet Take 1 tablet (25 mg total) by mouth daily. 90 tablet 3   Multiple Vitamin (MULTIVITAMIN) capsule Take 1 capsule by mouth daily.     nitroGLYCERIN (NITROSTAT) 0.4 MG SL tablet Place 1 tablet (0.4 mg total) under the tongue every 5 (five) minutes as needed. 25 tablet 0   sildenafil (VIAGRA) 100 MG tablet Take 100 mg by mouth daily as needed for erectile dysfunction.     traZODone (DESYREL) 150 MG tablet Take 1 tablet (150 mg total) by mouth at bedtime. 90 tablet 1   No current facility-administered medications on file prior to visit.    BP 110/70   Pulse (!) 55   Temp 98 F (36.7 C) (Oral)   Ht 5\' 8"  (1.727 m)   Wt 217 lb (98.4 kg)   SpO2 95%   BMI 32.99 kg/m       Objective:   Physical Exam Vitals and nursing note reviewed.  Constitutional:      General: He is not in acute distress.    Appearance: Normal appearance. He is not ill-appearing.  HENT:     Head: Normocephalic and atraumatic.     Right Ear: Tympanic membrane, ear canal and external ear normal. There is no impacted cerumen.     Left Ear: Tympanic membrane, ear canal and external ear normal. There is no impacted cerumen.     Nose: Nose normal. No congestion or rhinorrhea.     Mouth/Throat:     Mouth: Mucous membranes are moist.     Pharynx: Oropharynx is clear.  Eyes:     Extraocular Movements: Extraocular movements intact.     Conjunctiva/sclera: Conjunctivae normal.     Pupils: Pupils are equal, round, and reactive to light.  Neck:     Vascular: No carotid bruit.  Cardiovascular:      Rate and Rhythm: Normal rate and regular rhythm.     Pulses: Normal pulses.     Heart sounds: No murmur heard.    No friction rub. No gallop.  Pulmonary:     Effort: Pulmonary effort is normal.     Breath sounds: Normal breath sounds.  Abdominal:     General: Abdomen is flat. Bowel sounds are normal. There is no distension.     Palpations: Abdomen is soft. There is no mass.     Tenderness: There is no abdominal tenderness. There is no guarding or rebound.     Hernia: No hernia is present.  Musculoskeletal:        General: Normal range of motion.     Cervical back: Normal range of motion and neck supple.  Lymphadenopathy:     Cervical: No cervical adenopathy.  Skin:    General: Skin is warm and dry.     Capillary Refill: Capillary refill takes less than 2 seconds.  Neurological:     General: No focal deficit present.     Mental Status: He is alert and oriented to person, place, and time.  Psychiatric:        Mood and Affect: Mood normal.        Behavior: Behavior normal.  Thought Content: Thought content normal.        Judgment: Judgment normal.        Assessment & Plan:  1. Routine general medical examination at a health care facility Today patient counseled on age appropriate routine health concerns for screening and prevention, each reviewed and up to date or declined. Immunizations reviewed and up to date or declined. Labs ordered and reviewed. Risk factors for depression reviewed and negative. Hearing function and visual acuity are intact. ADLs screened and addressed as needed. Functional ability and level of safety reviewed and appropriate. Education, counseling and referrals performed based on assessed risks today. Patient provided with a copy of personalized plan for preventive services. - Work on weight loss through diet and exercise - Follow up in 1 year or sooner if needed  2. Essential hypertension - Well controlled. No change in medication  - Lipid  panel; Future - TSH; Future - CBC; Future - Comprehensive metabolic panel; Future  3. Prostate cancer screening - PSA; Future  4. Coronary artery disease involving native coronary artery of native heart without angina pectoris - Per cardiology  - Lipid panel; Future - TSH; Future - CBC; Future - Comprehensive metabolic panel; Future  5. Pure hypercholesterolemia - Continue statin and zetia  - Lipid panel; Future - TSH; Future - CBC; Future - Comprehensive metabolic panel; Future  6. Primary insomnia - Continue trazodone 150 mg QHS  - Lipid panel; Future - TSH; Future - CBC; Future - Comprehensive metabolic panel; Future  7. Depression, recurrent (HCC) - PHQ9 = 2  - Continue with Wellbutrin and therapy  - Lipid panel; Future - TSH; Future - CBC; Future - Comprehensive metabolic panel; Future  [name], NP

## 2023-03-15 NOTE — Patient Instructions (Signed)
It was great seeing you today   We will follow up with you regarding your lab work   Please let me know if you need anything   

## 2023-03-16 ENCOUNTER — Other Ambulatory Visit: Payer: Self-pay | Admitting: Adult Health

## 2023-03-16 ENCOUNTER — Ambulatory Visit: Payer: Medicare Other | Attending: Cardiology | Admitting: Cardiology

## 2023-03-16 ENCOUNTER — Encounter: Payer: Self-pay | Admitting: Cardiology

## 2023-03-16 VITALS — BP 116/72 | HR 70 | Ht 70.0 in | Wt 218.4 lb

## 2023-03-16 DIAGNOSIS — L578 Other skin changes due to chronic exposure to nonionizing radiation: Secondary | ICD-10-CM | POA: Diagnosis not present

## 2023-03-16 DIAGNOSIS — Z86018 Personal history of other benign neoplasm: Secondary | ICD-10-CM | POA: Diagnosis not present

## 2023-03-16 DIAGNOSIS — D225 Melanocytic nevi of trunk: Secondary | ICD-10-CM | POA: Diagnosis not present

## 2023-03-16 DIAGNOSIS — N289 Disorder of kidney and ureter, unspecified: Secondary | ICD-10-CM

## 2023-03-16 DIAGNOSIS — Z951 Presence of aortocoronary bypass graft: Secondary | ICD-10-CM

## 2023-03-16 DIAGNOSIS — L570 Actinic keratosis: Secondary | ICD-10-CM | POA: Diagnosis not present

## 2023-03-16 DIAGNOSIS — E782 Mixed hyperlipidemia: Secondary | ICD-10-CM | POA: Diagnosis not present

## 2023-03-16 DIAGNOSIS — I10 Essential (primary) hypertension: Secondary | ICD-10-CM | POA: Diagnosis not present

## 2023-03-16 DIAGNOSIS — I7143 Infrarenal abdominal aortic aneurysm, without rupture: Secondary | ICD-10-CM | POA: Diagnosis not present

## 2023-03-16 DIAGNOSIS — D1721 Benign lipomatous neoplasm of skin and subcutaneous tissue of right arm: Secondary | ICD-10-CM | POA: Diagnosis not present

## 2023-03-16 DIAGNOSIS — D1722 Benign lipomatous neoplasm of skin and subcutaneous tissue of left arm: Secondary | ICD-10-CM | POA: Diagnosis not present

## 2023-03-16 DIAGNOSIS — L821 Other seborrheic keratosis: Secondary | ICD-10-CM | POA: Diagnosis not present

## 2023-03-16 DIAGNOSIS — Z85828 Personal history of other malignant neoplasm of skin: Secondary | ICD-10-CM | POA: Diagnosis not present

## 2023-03-16 DIAGNOSIS — D485 Neoplasm of uncertain behavior of skin: Secondary | ICD-10-CM | POA: Diagnosis not present

## 2023-03-16 DIAGNOSIS — I2510 Atherosclerotic heart disease of native coronary artery without angina pectoris: Secondary | ICD-10-CM | POA: Diagnosis not present

## 2023-03-16 NOTE — Patient Instructions (Signed)
Medication Instructions:  Your physician recommends that you continue on your current medications as directed. Please refer to the Current Medication list given to you today.  *If you need a refill on your cardiac medications before your next appointment, please call your pharmacy*   Lab Work: NONE If you have labs (blood work) drawn today and your tests are completely normal, you will receive your results only by: MyChart Message (if you have MyChart) OR A paper copy in the mail If you have any lab test that is abnormal or we need to change your treatment, we will call you to review the results.    Follow-Up: AS SCHEDULED

## 2023-03-17 ENCOUNTER — Telehealth: Payer: Self-pay | Admitting: Adult Health

## 2023-03-17 NOTE — Telephone Encounter (Signed)
Pt is returning [place] call he is still having issue with his phone and would like a message sent to his mychart and he will get back with office

## 2023-03-18 ENCOUNTER — Encounter: Payer: Self-pay | Admitting: Adult Health

## 2023-03-18 DIAGNOSIS — H35033 Hypertensive retinopathy, bilateral: Secondary | ICD-10-CM | POA: Diagnosis not present

## 2023-03-18 DIAGNOSIS — H40053 Ocular hypertension, bilateral: Secondary | ICD-10-CM | POA: Diagnosis not present

## 2023-03-18 DIAGNOSIS — H401131 Primary open-angle glaucoma, bilateral, mild stage: Secondary | ICD-10-CM | POA: Diagnosis not present

## 2023-03-18 DIAGNOSIS — H2513 Age-related nuclear cataract, bilateral: Secondary | ICD-10-CM | POA: Diagnosis not present

## 2023-03-18 NOTE — Telephone Encounter (Signed)
T notified of update and has been scheduled.

## 2023-03-18 NOTE — Telephone Encounter (Signed)
Noted  

## 2023-03-21 DIAGNOSIS — L231 Allergic contact dermatitis due to adhesives: Secondary | ICD-10-CM | POA: Diagnosis not present

## 2023-03-28 DIAGNOSIS — R69 Illness, unspecified: Secondary | ICD-10-CM | POA: Diagnosis not present

## 2023-04-05 ENCOUNTER — Other Ambulatory Visit (INDEPENDENT_AMBULATORY_CARE_PROVIDER_SITE_OTHER): Payer: Medicare Other

## 2023-04-05 DIAGNOSIS — N289 Disorder of kidney and ureter, unspecified: Secondary | ICD-10-CM

## 2023-04-05 LAB — BASIC METABOLIC PANEL
BUN: 14 mg/dL (ref 6–23)
CO2: 27 mEq/L (ref 19–32)
Calcium: 9.5 mg/dL (ref 8.4–10.5)
Chloride: 102 mEq/L (ref 96–112)
Creatinine, Ser: 1.26 mg/dL (ref 0.40–1.50)
GFR: 55.67 mL/min — ABNORMAL LOW (ref >60.00)
Glucose, Bld: 94 mg/dL (ref 70–99)
Potassium: 3.8 mEq/L (ref 3.5–5.1)
Sodium: 139 mEq/L (ref 135–145)

## 2023-04-06 ENCOUNTER — Encounter: Payer: Self-pay | Admitting: Cardiology

## 2023-04-06 MED ORDER — HYDROCHLOROTHIAZIDE 25 MG PO TABS: 12.5000 mg | ORAL_TABLET | Freq: Every day | ORAL | 3 refills | Status: DC

## 2023-04-12 ENCOUNTER — Ambulatory Visit: Payer: Medicare Other | Admitting: Physician Assistant

## 2023-04-14 DIAGNOSIS — M170 Bilateral primary osteoarthritis of knee: Secondary | ICD-10-CM | POA: Diagnosis not present

## 2023-05-22 ENCOUNTER — Encounter: Payer: Self-pay | Admitting: Adult Health

## 2023-05-22 ENCOUNTER — Encounter: Payer: Self-pay | Admitting: Cardiology

## 2023-05-26 NOTE — Telephone Encounter (Signed)
Spoke with patient to schedule appointments for he and his wife with Dr Eldridge Dace. The patient would like to be seen before he goes on vacation in October. The openings that were available would not work for the patient and there were no other available appointments before  mid October.   Advised patient I would forward to scheduling and Dr Hoyle Barr nurse for review and we would follow up with him.

## 2023-05-31 ENCOUNTER — Other Ambulatory Visit: Payer: Self-pay | Admitting: Adult Health

## 2023-05-31 DIAGNOSIS — F339 Major depressive disorder, recurrent, unspecified: Secondary | ICD-10-CM

## 2023-06-01 DIAGNOSIS — M170 Bilateral primary osteoarthritis of knee: Secondary | ICD-10-CM | POA: Diagnosis not present

## 2023-06-12 ENCOUNTER — Encounter: Payer: Self-pay | Admitting: Cardiology

## 2023-07-07 ENCOUNTER — Ambulatory Visit (INDEPENDENT_AMBULATORY_CARE_PROVIDER_SITE_OTHER): Payer: Medicare Other | Admitting: Adult Health

## 2023-07-07 VITALS — BP 120/62 | HR 56 | Temp 98.3°F | Ht 70.0 in | Wt 215.0 lb

## 2023-07-07 DIAGNOSIS — M545 Low back pain, unspecified: Secondary | ICD-10-CM | POA: Diagnosis not present

## 2023-07-07 MED ORDER — CYCLOBENZAPRINE HCL 10 MG PO TABS
10.0000 mg | ORAL_TABLET | Freq: Every day | ORAL | 0 refills | Status: AC
Start: 2023-07-07 — End: ?

## 2023-07-07 NOTE — Progress Notes (Signed)
Subjective:    Patient ID: Dave Robles, male    DOB: [birth year], 77 y.o.   MRN: [id number]  Back Pain This is a new problem. The current episode started 1 to 4 weeks ago (2 weeks). The problem occurs intermittently. The problem has been waxing and waning since onset. The pain is present in the lumbar spine. The quality of the pain is described as stabbing and shooting. The pain does not radiate. The symptoms are aggravated by bending, sitting, standing and twisting. Pertinent negatives include no abdominal pain, bladder incontinence, bowel incontinence, dysuria, numbness, paresthesias, perianal numbness, tingling or weakness. He has tried analgesics for the symptoms. The treatment provided mild relief.      Review of Systems  Gastrointestinal:  Negative for abdominal pain and bowel incontinence.  Genitourinary:  Negative for bladder incontinence and dysuria.  Musculoskeletal:  Positive for back pain.  Neurological:  Negative for tingling, weakness, numbness and paresthesias.   Past Medical History:  Diagnosis Date   AAA (abdominal aortic aneurysm) (HCC)    AAA (abdominal aortic aneurysm) without rupture (HCC) 01/19/2017   Adjustment disorder with depressed mood 03/04/2009   Arthritis    Colon polyps    CORONARY ARTERY DISEASE 07/10/2007   Coronary artery disease involving native coronary artery of native heart without angina pectoris 07/10/2007   Qualifier: Diagnosis of  By: Tawanna Cooler, RN, Alvino Chapel     Depression    DOE (dyspnea on exertion)    Episodic mood disorder (HCC) 08/01/2014   ERECTILE DYSFUNCTION 07/10/2007   Extrinsic asthma, unspecified 09/10/2008   HYPERLIPIDEMIA 07/10/2007   HYPERTENSION 07/10/2007   Myocardial infarction (HCC) 2016   when had angioplasty- was told had a silent MI   OA (osteoarthritis) of hip 04/19/2018   Onychomycosis 05/14/2014   Pain in joint of left knee 01/19/2017   Personal history of colonic polyps 07/08/2014   1997, 2001 8 mm transverse serrated polyp  right colon 2010 07/08/2014 no polyps - repeat colon 2022      S/P CABG x 2 05/27/2020   TOBACCO USE 12/15/2009    Social History   Socioeconomic History   Marital status: Married    Spouse name: Not on file   Number of children: Not on file   Years of education: Not on file   Highest education level: Bachelor's degree (e.g., BA, AB, BS)  Occupational History   Not on file  Tobacco Use   Smoking status: Former    Current packs/day: 0.00    Types: Cigarettes    Quit date: 1991    Years since quitting: 33.6   Smokeless tobacco: Never  Vaping Use   Vaping status: Never Used  Substance and Sexual Activity   Alcohol use: Yes    Alcohol/week: 1.0 standard drink of alcohol    Types: 1 Cans of beer per week    Comment: Gin 2 x week   Drug use: No   Sexual activity: Yes    Comment: regular exercise - yes  Other Topics Concern   Not on file  Social History Narrative   Retired from being an Network engineer of a small business    Married    No kids       He likes to play golf and rides bike       Social Determinants of Corporate investment banker Strain: Low Risk  (07/07/2023)   Overall Financial Resource Strain (CARDIA)    Difficulty of Paying Living Expenses: Not hard at all  Food Insecurity: No Food Insecurity (07/07/2023)   Hunger Vital Sign    Worried About Running Out of Food in the Last Year: Never true    Ran Out of Food in the Last Year: Never true  Transportation Needs: No Transportation Needs (07/07/2023)   PRAPARE - Administrator, Civil Service (Medical): No    Lack of Transportation (Non-Medical): No  Physical Activity: Sufficiently Active (07/07/2023)   Exercise Vital Sign    Days of Exercise per Week: 4 days    Minutes of Exercise per Session: 50 min  Stress: No Stress Concern Present (07/07/2023)   [employer] of Occupational Health - Occupational Stress Questionnaire    Feeling of Stress : Not at all  Social Connections: Unknown (07/07/2023)   Social  Connection and Isolation Panel [NHANES]    Frequency of Communication with Friends and Family: More than three times a week    Frequency of Social Gatherings with Friends and Family: Three times a week    Attends Religious Services: Patient declined    Active Member of Clubs or Organizations: No    Attends Banker Meetings: Never    Marital Status: Married  Catering manager Violence: Not At Risk (03/11/2023)   Humiliation, Afraid, Rape, and Kick questionnaire    Fear of Current or Ex-Partner: No    Emotionally Abused: No    Physically Abused: No    Sexually Abused: No    Past Surgical History:  Procedure Laterality Date   COLONOSCOPY     CORONARY ANGIOPLASTY  12/2014   CORONARY ARTERY BYPASS GRAFT  11/15/89   5 vessels   CORONARY ARTERY BYPASS GRAFT N/A 05/27/2020   Procedure: REDO CORONARY ARTERY BYPASS GRAFTING (CABG), ON PUMP, TIMES TWO, USING RIGHT INTERNAL MAMMARY ARTERY AND LEFT RADIAL ARTERY (OPEN);  Surgeon: Delight Ovens, MD;  Location: [facility] OR;  Service: Open Heart Surgery;  Laterality: N/A;  Left Radial Artery to PDA FREE RIMA to OM   HYDROCELE EXCISION / REPAIR  2011   INGUINAL HERNIA REPAIR Left 1985   LEFT HEART CATH AND CORS/GRAFTS ANGIOGRAPHY N/A 05/13/2020   Procedure: LEFT HEART CATH AND CORS/GRAFTS ANGIOGRAPHY;  Surgeon: Lyn Records, MD;  Location: [facility] INVASIVE CV LAB;  Service: Cardiovascular;  Laterality: N/A;   MEDIASTERNOTOMY N/A 05/27/2020   Procedure: MEDIAN STERNOTOMY;  Surgeon: Delight Ovens, MD;  Location: [facility] OR;  Service: Open Heart Surgery;  Laterality: N/A;  Redo Median Sternotomy   RADIAL ARTERY HARVEST Left 05/27/2020   Procedure: RADIAL ARTERY HARVEST;  Surgeon: Delight Ovens, MD;  Location: [facility] OR;  Service: Open Heart Surgery;  Laterality: Left;   TEE WITHOUT CARDIOVERSION N/A 05/27/2020   Procedure: TRANSESOPHAGEAL ECHOCARDIOGRAM (TEE);  Surgeon: Delight Ovens, MD;  Location: [facility] OR;  Service: Open Heart Surgery;   Laterality: N/A;   TOTAL HIP ARTHROPLASTY Right 04/19/2018   Procedure: RIGHT TOTAL HIP ARTHROPLASTY ANTERIOR APPROACH;  Surgeon: Ollen Gross, MD;  Location: [facility] ORS;  Service: Orthopedics;  Laterality: Right;    Family History  Problem Relation Age of Onset   Coronary artery disease Father        cabg   Cancer Mother        Breast and or lung    Depression Mother    Hyperlipidemia Other    Hypertension Other    Depression Paternal Grandfather    Colon cancer Neg Hx     No Known Allergies  Current Outpatient Medications on File Prior to  Visit  Medication Sig Dispense Refill   acetaminophen (TYLENOL) 325 MG tablet Take 650 mg by mouth every 6 (six) hours as needed for moderate pain or headache.      albuterol (VENTOLIN HFA) 108 (90 Base) MCG/ACT inhaler as needed.     atorvastatin (LIPITOR) 80 MG tablet TAKE 1 TABLET DAILY 90 tablet 3   azelastine (ASTELIN) 0.1 % nasal spray Place 1 spray into both nostrils 2 (two) times daily. Use in each nostril as directed 30 mL 12   buPROPion (WELLBUTRIN XL) 300 MG 24 hr tablet TAKE 1 TABLET BY MOUTH DAILY 90 tablet 3   calcipotriene (DOVONOX) 0.005 % cream Apply 1 application topically 2 (two) times daily.     clobetasol cream (TEMOVATE) 0.05 % Apply 1 application topically daily.     clopidogrel (PLAVIX) 75 MG tablet Take 1 tablet (75 mg total) by mouth daily. 90 tablet 3   ezetimibe (ZETIA) 10 MG tablet Take 1 tablet (10 mg total) by mouth daily. 90 tablet 3   furosemide (LASIX) 20 MG tablet TAKE 1 TABLET DAILY AS     NEEDED FOR LEG SWELLING 90 tablet 1   hydrochlorothiazide (HYDRODIURIL) 25 MG tablet Take 0.5 tablets (12.5 mg total) by mouth daily. 45 tablet 3   lisinopril (ZESTRIL) 20 MG tablet Take 1 tablet (20 mg total) by mouth daily. 90 tablet 3   Melatonin 10 MG TABS Take 20 mg by mouth at bedtime.     metoprolol succinate (TOPROL-XL) 25 MG 24 hr tablet Take 1 tablet (25 mg total) by mouth daily. 90 tablet 3   Multiple Vitamin  (MULTIVITAMIN) capsule Take 1 capsule by mouth daily.     nitroGLYCERIN (NITROSTAT) 0.4 MG SL tablet Place 1 tablet (0.4 mg total) under the tongue every 5 (five) minutes as needed. 25 tablet 0   sildenafil (VIAGRA) 100 MG tablet Take 100 mg by mouth daily as needed for erectile dysfunction.     traZODone (DESYREL) 150 MG tablet TAKE 1 TABLET BY MOUTH AT  BEDTIME 90 tablet 3   VYZULTA 0.024 % SOLN Apply to eye.     No current facility-administered medications on file prior to visit.    BP 120/62   Pulse (!) 56   Temp 98.3 F (36.8 C) (Oral)   Ht 5\' 10"  (1.778 m)   Wt 215 lb (97.5 kg)   SpO2 95%   BMI 30.85 kg/m       Objective:   Physical Exam Vitals and nursing note reviewed.  Constitutional:      Appearance: Normal appearance.  Cardiovascular:     Rate and Rhythm: Normal rate.  Musculoskeletal:        General: Tenderness present. Normal range of motion.  Skin:    General: Skin is warm and dry.  Neurological:     General: No focal deficit present.     Mental Status: He is alert and oriented to person, place, and time.  Psychiatric:        Mood and Affect: Mood normal.        Behavior: Behavior normal.        Thought Content: Thought content normal.        Judgment: Judgment normal.       Assessment & Plan:   1. Acute right-sided low back pain without sciatica - Appears to be muscular in origin. Will send in Flexeril to take. He understands this may cause him to be drowsy.  - Follow up if not resolved  in the next 4-5 days  - Can use Tylenol and heating pad - cyclobenzaprine (FLEXERIL) 10 MG tablet; Take 1 tablet (10 mg total) by mouth at bedtime.  Dispense: 15 tablet; Refill: 0   [name], NP

## 2023-07-13 DIAGNOSIS — L570 Actinic keratosis: Secondary | ICD-10-CM | POA: Diagnosis not present

## 2023-07-13 DIAGNOSIS — D045 Carcinoma in situ of skin of trunk: Secondary | ICD-10-CM | POA: Diagnosis not present

## 2023-07-13 DIAGNOSIS — Z85828 Personal history of other malignant neoplasm of skin: Secondary | ICD-10-CM | POA: Diagnosis not present

## 2023-07-13 DIAGNOSIS — D044 Carcinoma in situ of skin of scalp and neck: Secondary | ICD-10-CM | POA: Diagnosis not present

## 2023-07-13 DIAGNOSIS — L578 Other skin changes due to chronic exposure to nonionizing radiation: Secondary | ICD-10-CM | POA: Diagnosis not present

## 2023-07-13 DIAGNOSIS — D485 Neoplasm of uncertain behavior of skin: Secondary | ICD-10-CM | POA: Diagnosis not present

## 2023-07-13 DIAGNOSIS — D225 Melanocytic nevi of trunk: Secondary | ICD-10-CM | POA: Diagnosis not present

## 2023-07-13 DIAGNOSIS — D1721 Benign lipomatous neoplasm of skin and subcutaneous tissue of right arm: Secondary | ICD-10-CM | POA: Diagnosis not present

## 2023-07-13 DIAGNOSIS — L821 Other seborrheic keratosis: Secondary | ICD-10-CM | POA: Diagnosis not present

## 2023-07-13 DIAGNOSIS — D1722 Benign lipomatous neoplasm of skin and subcutaneous tissue of left arm: Secondary | ICD-10-CM | POA: Diagnosis not present

## 2023-07-13 DIAGNOSIS — L080 Pyoderma: Secondary | ICD-10-CM | POA: Diagnosis not present

## 2023-07-15 DIAGNOSIS — M170 Bilateral primary osteoarthritis of knee: Secondary | ICD-10-CM | POA: Diagnosis not present

## 2023-07-19 DIAGNOSIS — H43813 Vitreous degeneration, bilateral: Secondary | ICD-10-CM | POA: Diagnosis not present

## 2023-07-19 DIAGNOSIS — H5213 Myopia, bilateral: Secondary | ICD-10-CM | POA: Diagnosis not present

## 2023-07-19 DIAGNOSIS — H524 Presbyopia: Secondary | ICD-10-CM | POA: Diagnosis not present

## 2023-07-19 DIAGNOSIS — H52223 Regular astigmatism, bilateral: Secondary | ICD-10-CM | POA: Diagnosis not present

## 2023-07-19 DIAGNOSIS — H40053 Ocular hypertension, bilateral: Secondary | ICD-10-CM | POA: Diagnosis not present

## 2023-07-19 DIAGNOSIS — H401131 Primary open-angle glaucoma, bilateral, mild stage: Secondary | ICD-10-CM | POA: Diagnosis not present

## 2023-07-19 DIAGNOSIS — H35033 Hypertensive retinopathy, bilateral: Secondary | ICD-10-CM | POA: Diagnosis not present

## 2023-07-22 DIAGNOSIS — M170 Bilateral primary osteoarthritis of knee: Secondary | ICD-10-CM | POA: Diagnosis not present

## 2023-07-29 DIAGNOSIS — M170 Bilateral primary osteoarthritis of knee: Secondary | ICD-10-CM | POA: Diagnosis not present

## 2023-08-15 NOTE — Progress Notes (Unsigned)
Cardiology Office Note:   Date:  08/15/2023  ID:  Dave Robles, DOB 11-Jun-1946, MRN [id number] PCP: [name], NP  [facility] Providers Cardiologist:  Meriam Sprague, MD { Click to update primary MD,subspecialty MD or APP then REFRESH:1}   History of Present Illness:   Dave Robles is a 77 y.o. male with a hx of CAD with original CABG x 3 with LIMA to LAD, SVG to DX and SVG to distal RCA in 1991 by Dr. Andrey Campanile, HTN, HLD, ED, past tobacco abuse, asthma, AAA (last measured in 10/2019 - 3.4 cm) and depression  Discussed the use of AI scribe software for clinical note transcription with the patient, who gave verbal consent to proceed.  History of Present Illness             Today patient denies chest pain, shortness of breath, lower extremity edema, fatigue, palpitations, melena, hematuria, hemoptysis, diaphoresis, weakness, presyncope, syncope, orthopnea, and PND.   Studies Reviewed:    EKG:        ***  Risk Assessment/Calculations:   {Does this patient have ATRIAL FIBRILLATION?:[phone]} No BP recorded.  {Refresh Note OR Click here to enter BP  :1}***        Physical Exam:   VS:  There were no vitals taken for this visit.   Wt Readings from Last 3 Encounters:  07/07/23 215 lb (97.5 kg)  03/16/23 218 lb 6.4 oz (99.1 kg)  03/15/23 217 lb (98.4 kg)     Physical Exam  Physical Exam           ASSESSMENT AND PLAN:     Assessment and Plan                  {Are you ordering a CV Procedure (e.g. stress test, cath, DCCV, TEE, etc)?   Press F2        :[id number]}   Signed, Perlie Gold, PA-C

## 2023-08-16 ENCOUNTER — Encounter: Payer: Self-pay | Admitting: Cardiology

## 2023-08-16 ENCOUNTER — Ambulatory Visit: Payer: Medicare Other | Admitting: Cardiology

## 2023-08-16 ENCOUNTER — Ambulatory Visit: Payer: Medicare Other | Attending: Cardiology | Admitting: Cardiology

## 2023-08-16 VITALS — BP 120/68 | HR 60 | Ht 70.0 in | Wt 208.8 lb

## 2023-08-16 DIAGNOSIS — I2510 Atherosclerotic heart disease of native coronary artery without angina pectoris: Secondary | ICD-10-CM | POA: Diagnosis not present

## 2023-08-16 DIAGNOSIS — I7143 Infrarenal abdominal aortic aneurysm, without rupture: Secondary | ICD-10-CM

## 2023-08-16 DIAGNOSIS — E7800 Pure hypercholesterolemia, unspecified: Secondary | ICD-10-CM | POA: Diagnosis not present

## 2023-08-16 DIAGNOSIS — I10 Essential (primary) hypertension: Secondary | ICD-10-CM | POA: Diagnosis not present

## 2023-08-16 DIAGNOSIS — Z951 Presence of aortocoronary bypass graft: Secondary | ICD-10-CM | POA: Diagnosis not present

## 2023-08-16 NOTE — Patient Instructions (Signed)
Medication Instructions:  Your physician recommends that you continue on your current medications as directed. Please refer to the Current Medication list given to you today.  *If you need a refill on your cardiac medications before your next appointment, please call your pharmacy*   Lab Work: None  If you have labs (blood work) drawn today and your tests are completely normal, you will receive your results only by: MyChart Message (if you have MyChart) OR A paper copy in the mail If you have any lab test that is abnormal or we need to change your treatment, we will call you to review the results.   Testing/Procedures: None   Follow-Up: At [facility], you and your health needs are our priority.  As part of our continuing mission to provide you with exceptional heart care, we have created designated Provider Care Teams.  These Care Teams include your primary Cardiologist (physician) and Advanced Practice Providers (APPs -  Physician Assistants and Nurse Practitioners) who all work together to provide you with the care you need, when you need it.  We recommend signing up for the patient portal called "MyChart".  Sign up information is provided on this After Visit Summary.  MyChart is used to connect with patients for Virtual Visits (Telemedicine).  Patients are able to view lab/test results, encounter notes, upcoming appointments, etc.  Non-urgent messages can be sent to your provider as well.   To learn more about what you can do with MyChart, go to [email or web address].    Your next appointment:   6 month(s)  Provider:   Dr. Rosemary Holms, Dr. Odis Hollingshead, or Dr. Jacinto Halim

## 2023-08-18 ENCOUNTER — Other Ambulatory Visit (HOSPITAL_COMMUNITY): Payer: Medicare HMO

## 2023-08-18 ENCOUNTER — Ambulatory Visit (HOSPITAL_COMMUNITY)
Admission: RE | Admit: 2023-08-18 | Discharge: 2023-08-18 | Disposition: A | Discharge status: Home / Self Care | Payer: Medicare Other | Source: Ambulatory Visit | Attending: Internal Medicine | Admitting: Internal Medicine

## 2023-08-18 DIAGNOSIS — I7143 Infrarenal abdominal aortic aneurysm, without rupture: Secondary | ICD-10-CM | POA: Insufficient documentation

## 2023-08-18 NOTE — Progress Notes (Signed)
Patient of Dr. Aram Beecham. Stable small abdominal aortic aneurysm fr the past 3 years. Recheck in 2 years  Abdominal Aortic Duplex 08/18/2023: Abdominal Aorta: There is evidence of abnormal dilatation of the mid and distal Abdominal aorta. The largest aortic measurement is 3.7 cm. Ectatic left common iliac artery, with stable dimensions at 1.6 cm. The largest aortic diameter remains essentially  unchanged compared to prior exam. Previous diameter measurement was 3.7 cm obtained on 08/17/2022.

## 2023-08-19 ENCOUNTER — Telehealth: Payer: Self-pay | Admitting: Cardiology

## 2023-08-19 NOTE — Telephone Encounter (Signed)
Patient returned staff call regarding results.

## 2023-08-19 NOTE — Telephone Encounter (Signed)
Perlie Gold, PA-C 08/19/2023  6:31 AM EDT     Stable small abdominal aortic aneurysm unchanged the past 3 years. We'll plan to repeat imaging in 2 years given stability (per guidelines patients with AAAs 3.0 to 3.9 cm in diameter should be monitored with ultrasonography every two to three years).   Yates Decamp, MD 08/18/2023 10:16 PM EDT     Patient of Dr. Aram Beecham. Stable small abdominal aortic aneurysm fr the past 3 years. Recheck in 2 years   Abdominal Aortic Duplex 08/18/2023: Abdominal Aorta: There is evidence of abnormal dilatation of the mid and distal Abdominal aorta. The largest aortic measurement is 3.7 cm. Ectatic left common iliac artery, with stable dimensions at 1.6 cm. The largest aortic diameter remains essentially  unchanged compared to prior exam. Previous diameter measurement was 3.7 cm obtained on 08/17/2022.    Patient is aware of results. Patient will follow-up per recall in April for office visit.

## 2023-09-05 DIAGNOSIS — H401131 Primary open-angle glaucoma, bilateral, mild stage: Secondary | ICD-10-CM | POA: Diagnosis not present

## 2023-09-05 DIAGNOSIS — H25813 Combined forms of age-related cataract, bilateral: Secondary | ICD-10-CM | POA: Diagnosis not present

## 2023-10-05 DIAGNOSIS — Z7189 Other specified counseling: Secondary | ICD-10-CM | POA: Diagnosis not present

## 2023-10-05 DIAGNOSIS — Z85828 Personal history of other malignant neoplasm of skin: Secondary | ICD-10-CM | POA: Diagnosis not present

## 2023-10-05 DIAGNOSIS — L578 Other skin changes due to chronic exposure to nonionizing radiation: Secondary | ICD-10-CM | POA: Diagnosis not present

## 2023-10-05 DIAGNOSIS — D1801 Hemangioma of skin and subcutaneous tissue: Secondary | ICD-10-CM | POA: Diagnosis not present

## 2023-10-05 DIAGNOSIS — Z08 Encounter for follow-up examination after completed treatment for malignant neoplasm: Secondary | ICD-10-CM | POA: Diagnosis not present

## 2023-10-05 DIAGNOSIS — D485 Neoplasm of uncertain behavior of skin: Secondary | ICD-10-CM | POA: Diagnosis not present

## 2023-10-10 ENCOUNTER — Other Ambulatory Visit: Payer: Self-pay

## 2023-10-10 DIAGNOSIS — Z951 Presence of aortocoronary bypass graft: Secondary | ICD-10-CM

## 2023-10-10 DIAGNOSIS — E782 Mixed hyperlipidemia: Secondary | ICD-10-CM

## 2023-10-10 DIAGNOSIS — I2510 Atherosclerotic heart disease of native coronary artery without angina pectoris: Secondary | ICD-10-CM

## 2023-10-10 DIAGNOSIS — E7800 Pure hypercholesterolemia, unspecified: Secondary | ICD-10-CM

## 2023-10-10 DIAGNOSIS — I10 Essential (primary) hypertension: Secondary | ICD-10-CM

## 2023-10-10 DIAGNOSIS — Z79899 Other long term (current) drug therapy: Secondary | ICD-10-CM

## 2023-10-10 MED ORDER — EZETIMIBE 10 MG PO TABS: 10.0000 mg | ORAL_TABLET | Freq: Every day | ORAL | 3 refills | Status: DC

## 2023-10-10 MED ORDER — METOPROLOL SUCCINATE ER 25 MG PO TB24: 25.0000 mg | ORAL_TABLET | Freq: Every day | ORAL | 3 refills | Status: DC

## 2023-10-11 DIAGNOSIS — D044 Carcinoma in situ of skin of scalp and neck: Secondary | ICD-10-CM | POA: Diagnosis not present

## 2023-10-11 DIAGNOSIS — D045 Carcinoma in situ of skin of trunk: Secondary | ICD-10-CM | POA: Diagnosis not present

## 2023-10-11 DIAGNOSIS — Z7189 Other specified counseling: Secondary | ICD-10-CM | POA: Diagnosis not present

## 2023-10-11 DIAGNOSIS — L578 Other skin changes due to chronic exposure to nonionizing radiation: Secondary | ICD-10-CM | POA: Diagnosis not present

## 2023-10-11 DIAGNOSIS — L821 Other seborrheic keratosis: Secondary | ICD-10-CM | POA: Diagnosis not present

## 2023-10-16 DIAGNOSIS — S51012A Laceration without foreign body of left elbow, initial encounter: Secondary | ICD-10-CM | POA: Diagnosis not present

## 2023-10-17 DIAGNOSIS — I119 Hypertensive heart disease without heart failure: Secondary | ICD-10-CM | POA: Diagnosis not present

## 2023-10-17 DIAGNOSIS — E782 Mixed hyperlipidemia: Secondary | ICD-10-CM | POA: Diagnosis not present

## 2023-10-17 DIAGNOSIS — Z951 Presence of aortocoronary bypass graft: Secondary | ICD-10-CM | POA: Diagnosis not present

## 2023-10-17 DIAGNOSIS — I7143 Infrarenal abdominal aortic aneurysm, without rupture: Secondary | ICD-10-CM | POA: Diagnosis not present

## 2023-10-17 DIAGNOSIS — I2510 Atherosclerotic heart disease of native coronary artery without angina pectoris: Secondary | ICD-10-CM | POA: Diagnosis not present

## 2023-10-28 DIAGNOSIS — H25811 Combined forms of age-related cataract, right eye: Secondary | ICD-10-CM | POA: Diagnosis not present

## 2023-10-28 DIAGNOSIS — H25813 Combined forms of age-related cataract, bilateral: Secondary | ICD-10-CM | POA: Diagnosis not present

## 2023-10-28 DIAGNOSIS — H401131 Primary open-angle glaucoma, bilateral, mild stage: Secondary | ICD-10-CM | POA: Diagnosis not present

## 2023-10-28 DIAGNOSIS — H52223 Regular astigmatism, bilateral: Secondary | ICD-10-CM | POA: Diagnosis not present

## 2023-10-28 DIAGNOSIS — H401111 Primary open-angle glaucoma, right eye, mild stage: Secondary | ICD-10-CM | POA: Diagnosis not present

## 2023-11-04 ENCOUNTER — Encounter: Payer: Self-pay | Admitting: Adult Health

## 2023-11-11 DIAGNOSIS — H25813 Combined forms of age-related cataract, bilateral: Secondary | ICD-10-CM | POA: Diagnosis not present

## 2023-11-11 DIAGNOSIS — H25812 Combined forms of age-related cataract, left eye: Secondary | ICD-10-CM | POA: Diagnosis not present

## 2023-11-11 DIAGNOSIS — H401121 Primary open-angle glaucoma, left eye, mild stage: Secondary | ICD-10-CM | POA: Diagnosis not present

## 2023-11-11 DIAGNOSIS — H52223 Regular astigmatism, bilateral: Secondary | ICD-10-CM | POA: Diagnosis not present

## 2023-11-11 DIAGNOSIS — H401131 Primary open-angle glaucoma, bilateral, mild stage: Secondary | ICD-10-CM | POA: Diagnosis not present

## 2023-11-23 DIAGNOSIS — K080 Exfoliation of teeth due to systemic causes: Secondary | ICD-10-CM | POA: Diagnosis not present

## 2023-11-29 DIAGNOSIS — K080 Exfoliation of teeth due to systemic causes: Secondary | ICD-10-CM | POA: Diagnosis not present

## 2023-12-13 ENCOUNTER — Encounter: Payer: Self-pay | Admitting: Adult Health

## 2023-12-14 MED ORDER — TRAZODONE HCL 150 MG PO TABS: 150.0000 mg | ORAL_TABLET | Freq: Every day | ORAL | 3 refills | Status: DC

## 2023-12-20 ENCOUNTER — Other Ambulatory Visit: Payer: Self-pay

## 2023-12-20 DIAGNOSIS — I714 Abdominal aortic aneurysm, without rupture, unspecified: Secondary | ICD-10-CM

## 2023-12-20 MED ORDER — CLOPIDOGREL BISULFATE 75 MG PO TABS: 75.0000 mg | ORAL_TABLET | Freq: Every day | ORAL | 2 refills | Status: DC

## 2023-12-29 DIAGNOSIS — K080 Exfoliation of teeth due to systemic causes: Secondary | ICD-10-CM | POA: Diagnosis not present

## 2024-01-04 DIAGNOSIS — T07XXXA Unspecified multiple injuries, initial encounter: Secondary | ICD-10-CM | POA: Diagnosis not present

## 2024-01-04 DIAGNOSIS — Z9229 Personal history of other drug therapy: Secondary | ICD-10-CM | POA: Diagnosis not present

## 2024-01-04 DIAGNOSIS — W19XXXA Unspecified fall, initial encounter: Secondary | ICD-10-CM | POA: Diagnosis not present

## 2024-01-04 DIAGNOSIS — T148XXA Other injury of unspecified body region, initial encounter: Secondary | ICD-10-CM | POA: Diagnosis not present

## 2024-01-06 DIAGNOSIS — I10 Essential (primary) hypertension: Secondary | ICD-10-CM | POA: Diagnosis not present

## 2024-01-06 DIAGNOSIS — E7800 Pure hypercholesterolemia, unspecified: Secondary | ICD-10-CM | POA: Diagnosis not present

## 2024-01-06 DIAGNOSIS — I714 Abdominal aortic aneurysm, without rupture, unspecified: Secondary | ICD-10-CM | POA: Diagnosis not present

## 2024-01-06 DIAGNOSIS — I25810 Atherosclerosis of coronary artery bypass graft(s) without angina pectoris: Secondary | ICD-10-CM | POA: Diagnosis not present

## 2024-01-11 DIAGNOSIS — I10 Essential (primary) hypertension: Secondary | ICD-10-CM | POA: Diagnosis not present

## 2024-01-11 DIAGNOSIS — Z7189 Other specified counseling: Secondary | ICD-10-CM | POA: Diagnosis not present

## 2024-01-11 DIAGNOSIS — Z7901 Long term (current) use of anticoagulants: Secondary | ICD-10-CM | POA: Diagnosis not present

## 2024-01-11 DIAGNOSIS — Z7902 Long term (current) use of antithrombotics/antiplatelets: Secondary | ICD-10-CM | POA: Diagnosis not present

## 2024-01-11 DIAGNOSIS — E785 Hyperlipidemia, unspecified: Secondary | ICD-10-CM | POA: Diagnosis not present

## 2024-01-11 DIAGNOSIS — I2510 Atherosclerotic heart disease of native coronary artery without angina pectoris: Secondary | ICD-10-CM | POA: Diagnosis not present

## 2024-01-11 DIAGNOSIS — W19XXXS Unspecified fall, sequela: Secondary | ICD-10-CM | POA: Diagnosis not present

## 2024-01-11 DIAGNOSIS — S51001A Unspecified open wound of right elbow, initial encounter: Secondary | ICD-10-CM | POA: Diagnosis not present

## 2024-01-11 DIAGNOSIS — Y9355 Activity, bike riding: Secondary | ICD-10-CM | POA: Diagnosis not present

## 2024-01-11 DIAGNOSIS — L578 Other skin changes due to chronic exposure to nonionizing radiation: Secondary | ICD-10-CM | POA: Diagnosis not present

## 2024-01-11 DIAGNOSIS — L570 Actinic keratosis: Secondary | ICD-10-CM | POA: Diagnosis not present

## 2024-01-11 DIAGNOSIS — Z87891 Personal history of nicotine dependence: Secondary | ICD-10-CM | POA: Diagnosis not present

## 2024-01-11 DIAGNOSIS — Z955 Presence of coronary angioplasty implant and graft: Secondary | ICD-10-CM | POA: Diagnosis not present

## 2024-01-11 DIAGNOSIS — R600 Localized edema: Secondary | ICD-10-CM | POA: Diagnosis not present

## 2024-01-11 DIAGNOSIS — Z79899 Other long term (current) drug therapy: Secondary | ICD-10-CM | POA: Diagnosis not present

## 2024-01-11 DIAGNOSIS — I252 Old myocardial infarction: Secondary | ICD-10-CM | POA: Diagnosis not present

## 2024-01-11 DIAGNOSIS — Z08 Encounter for follow-up examination after completed treatment for malignant neoplasm: Secondary | ICD-10-CM | POA: Diagnosis not present

## 2024-01-11 DIAGNOSIS — Z86007 Personal history of in-situ neoplasm of skin: Secondary | ICD-10-CM | POA: Diagnosis not present

## 2024-01-11 DIAGNOSIS — S81801A Unspecified open wound, right lower leg, initial encounter: Secondary | ICD-10-CM | POA: Diagnosis not present

## 2024-01-18 DIAGNOSIS — S81801D Unspecified open wound, right lower leg, subsequent encounter: Secondary | ICD-10-CM | POA: Diagnosis not present

## 2024-01-18 DIAGNOSIS — W19XXXS Unspecified fall, sequela: Secondary | ICD-10-CM | POA: Diagnosis not present

## 2024-01-18 DIAGNOSIS — R600 Localized edema: Secondary | ICD-10-CM | POA: Diagnosis not present

## 2024-01-18 DIAGNOSIS — S51001D Unspecified open wound of right elbow, subsequent encounter: Secondary | ICD-10-CM | POA: Diagnosis not present

## 2024-01-24 DIAGNOSIS — H6121 Impacted cerumen, right ear: Secondary | ICD-10-CM | POA: Diagnosis not present

## 2024-01-24 DIAGNOSIS — H903 Sensorineural hearing loss, bilateral: Secondary | ICD-10-CM | POA: Diagnosis not present

## 2024-02-01 DIAGNOSIS — E669 Obesity, unspecified: Secondary | ICD-10-CM | POA: Diagnosis not present

## 2024-02-01 DIAGNOSIS — M25562 Pain in left knee: Secondary | ICD-10-CM | POA: Diagnosis not present

## 2024-02-01 DIAGNOSIS — M25561 Pain in right knee: Secondary | ICD-10-CM | POA: Diagnosis not present

## 2024-02-01 DIAGNOSIS — M170 Bilateral primary osteoarthritis of knee: Secondary | ICD-10-CM | POA: Diagnosis not present

## 2024-02-07 DIAGNOSIS — H903 Sensorineural hearing loss, bilateral: Secondary | ICD-10-CM | POA: Diagnosis not present

## 2024-02-10 DIAGNOSIS — Z9841 Cataract extraction status, right eye: Secondary | ICD-10-CM | POA: Diagnosis not present

## 2024-02-10 DIAGNOSIS — Z9842 Cataract extraction status, left eye: Secondary | ICD-10-CM | POA: Diagnosis not present

## 2024-02-10 DIAGNOSIS — H401131 Primary open-angle glaucoma, bilateral, mild stage: Secondary | ICD-10-CM | POA: Diagnosis not present

## 2024-02-23 DIAGNOSIS — F331 Major depressive disorder, recurrent, moderate: Secondary | ICD-10-CM | POA: Diagnosis not present

## 2024-02-29 ENCOUNTER — Telehealth: Payer: Self-pay | Admitting: Cardiology

## 2024-02-29 ENCOUNTER — Ambulatory Visit: Attending: Cardiology | Admitting: Cardiology

## 2024-02-29 VITALS — BP 120/74 | HR 61 | Resp 16 | Ht 70.0 in | Wt 222.6 lb

## 2024-02-29 DIAGNOSIS — Z951 Presence of aortocoronary bypass graft: Secondary | ICD-10-CM

## 2024-02-29 DIAGNOSIS — Z955 Presence of coronary angioplasty implant and graft: Secondary | ICD-10-CM

## 2024-02-29 DIAGNOSIS — Z01810 Encounter for preprocedural cardiovascular examination: Secondary | ICD-10-CM

## 2024-02-29 DIAGNOSIS — I7143 Infrarenal abdominal aortic aneurysm, without rupture: Secondary | ICD-10-CM

## 2024-02-29 DIAGNOSIS — I2510 Atherosclerotic heart disease of native coronary artery without angina pectoris: Secondary | ICD-10-CM

## 2024-02-29 DIAGNOSIS — E7800 Pure hypercholesterolemia, unspecified: Secondary | ICD-10-CM

## 2024-02-29 DIAGNOSIS — I10 Essential (primary) hypertension: Secondary | ICD-10-CM

## 2024-02-29 NOTE — Progress Notes (Signed)
 Cardiology Office Note:  .   ID:  FAUST THORINGTON, DOB [birth year], MRN [id number] PCP:  Alto Atta, NP  Former Cardiology Providers: Felizardo Hotter, Dr. [facility] Providers Cardiologist:  Olinda Bertrand, DO , [facility] (established care 02/29/24) Electrophysiologist:  None  Click to update primary MD,subspecialty MD or APP then REFRESH:1}    Chief Complaint  Patient presents with   Coronary artery disease involving native coronary artery of   Follow-up    History of Present Illness: Aaron Aas   Dave Robles is a 78 y.o. Caucasian male whose past medical history and cardiovascular risk factors includes: hx of CAD with original CABG x 3 with LIMA to LAD, SVG to DX and SVG to distal RCA in 1991 by Dr. Elvan Hamel, RE-DO CABG in July 2021 (free right IMA to OM and Left radial graft to PDA) with Dr. Rollene Clink, s/p PCI to SVG to RCA,  HTN, HLD, ED, past tobacco abuse, asthma, AAA (last measured in 08/2023 - 3.7 cm) and depression   Formally under the care of Dr. Ardell Beauvais who last saw Noella Baton back in May 2024. I am seeing him for the first time to re-establishing care.   Patient has had a long cardiovascular history and based on the last progress note from Dr. Ardell Beauvais dated May 2024 the following is of reiteration of his clinical course.  Per review of the record, the patient had a NSTEMI in 2016 while vacationing in [place] .  Cath showed normal left main, 90% prox LAD, 99% mid LAD with patent LIMA to mid LAD and patent SVG to DX. LCX origin stenosis vs flow void? Fistula vs competitive flow in branch?, 100% RCA, SVG to RCA 99% origin treated with Xience DES and PTCA.   Patient has been closely followed by Isabelle Maple. Was seen in 04/2020 with worsening dyspnea on exertion which is his anginal equivalent. Was referred for cardiac cath at that time which led to need for repeat CABG on 05/27/20. Specifically, he received a left radial artery to PDA, and a free right internal  mammary artery to obtuse marginal.  Post-op course complicated by need for inotropic support and blood loss anemia requiring blood transfusion. Repeat TTE 03/04/21 with EF 60-65, no RWMA, mild LVH, GR 1 DD, normal RVSF, moderate LAE, mild AV sclerosis without stenosis.  He spends almost half the year in [place] , where he engages in physical activities such as taking a cardio and strength class twice a week, hiking, and using gym equipment like the stationary bike, elliptical, and treadmill. He has recently returned from [place]  and plans to continue a similar exercise routine locally.  He is scheduled for a left knee replacement in October and requires a preoperative release. The surgery will be performed at a hospital facility in [place]  by Dr. Amy Kansky at [facility].  Clinically denies anginal chest pain or heart failure symptoms.  Review of Systems: .   Review of Systems  Cardiovascular:  Negative for chest pain, claudication, irregular heartbeat, leg swelling, near-syncope, orthopnea, palpitations, paroxysmal nocturnal dyspnea and syncope.  Respiratory:  Negative for shortness of breath.   Hematologic/Lymphatic: Negative for bleeding problem.    Studies Reviewed:   EKG: EKG Interpretation Date/Time:  Wednesday February 29 2024 10:26:30 EDT Ventricular Rate:  59 PR Interval:  156 QRS Duration:  88 QT Interval:  404 QTC Calculation: 399 R Axis:   54  Text Interpretation: Sinus bradycardia When compared with ECG of 28-May-2020 06:29, Premature  ventricular complexes are no longer Present Vent. rate has decreased BY  35 BPM QT has shortened Confirmed by Olinda Bertrand [phone]) on 02/29/2024 10:34:53 AM  Echocardiogram: 02/2021: IMPRESSIONS   1. Left ventricular ejection fraction, by estimation, is 60 to 65%. The left ventricle has normal function. The left ventricle has no regional wall motion abnormalities. There is mild concentric left ventricular hypertrophy.  Left ventricular diastolic parameters are consistent with Grade I diastolic dysfunction (impaired relaxation).   2. Right ventricular systolic function is normal. The right ventricular size is normal.   3. Left atrial size was moderately dilated.   4. The mitral valve is normal in structure. No evidence of mitral valve regurgitation. No evidence of mitral stenosis.   5. The aortic valve is normal in structure. Aortic valve regurgitation is not visualized. Mild aortic valve sclerosis is present, with no evidence of aortic valve stenosis.   6. The inferior vena cava is normal in size with greater than 50% respiratory variability, suggesting right atrial pressure of 3 mmHg.  Cath 05/13/20: Total occlusion of the mid LAD, total occlusion of the mid RCA, and high-grade calcified obstruction in the ostial to proximal circumflex with diffuse 50% left main stenosis. Patent saphenous vein graft to the diagonal with 70 to 80% stenosis distal to the graft insertion site.  The diagonal fills retrogradely into the first septal perforator and the proximal LAD segment. Diffuse 95% in-stent restenosis in the ostial to proximal saphenous vein graft to the distal RCA. Normal LV function.  LVEDP is normal.  EF is estimated to be 60%.   RECOMMENDATIONS:   Critical decision point for this patient who had coronary bypass surgery in 1991, and now has diffuse in-stent restenosis in the ostium and proximal segment of the saphenous vein graft to the right coronary.  The native circumflex is heavily calcified and has critical stenosis.  The diagonal beyond the saphenous vein graft insertion site has significant disease.  Long-term patency of the right coronary graft will be markedly reduced PCI and restenting.  The technical standpoint it appears that the stent extends outside the ostium of the bypass graft.  The right coronary territory is large.  The patient is relatively healthy.  His best long-term approach may be repeat  coronary bypass surgery.  Before starting down the path of stent implantation, will  refer for heart team evaluation to consider surgical revascularization.  If deemed not a surgical candidate or if the patient prefers PCI, this could be attempted but will carry higher technical risk and likelihood of repeat revascularization  Vascular ultrasounds: Abdominal Aortic Duplex 08/18/2023: Abdominal Aorta: There is evidence of abnormal dilatation of the mid and distal Abdominal aorta. The largest aortic measurement is 3.7 cm. Ectatic left common iliac artery, with stable dimensions at 1.6 cm. The largest aortic diameter remains essentially  unchanged compared to prior exam. Previous diameter measurement was 3.7 cm obtained on 08/17/2022. Dr. Berry Bristol recommended a repeat abdominal aortic duplex in 2 years (see progress note dated 09/04/2023)  RADIOLOGY: CTA CHEST IMPRESSION 09/2020: 1. Mild likely consistent with very mild colitis and/or diverticulitis involving the mid to distal descending colon. 2. 3.5 cm x 3.2 cm focal aneurysmal dilatation of the infrarenal abdominal aorta. 3. Large right pleural effusion. 4. Mild-to-moderate severity right basilar atelectasis and/or infiltrate. 5. Total right hip replacement. 6. Aortic atherosclerosis.   Aortic Atherosclerosis (ICD10-I70.0).  Risk Assessment/Calculations:   NA   Labs:       Latest Ref Rng & Units 03/15/2023  8:20 AM 02/09/2022    8:09 AM 02/04/2021    8:48 AM  CBC  WBC 4.0 - 10.5 K/uL 8.1  8.1  8.3   Hemoglobin 13.0 - 17.0 g/dL 40.9  81.1  91.4   Hematocrit 39.0 - 52.0 % 47.1  47.8  47.6   Platelets 150.0 - 400.0 K/uL 159.0  180.0  159.0        Latest Ref Rng & Units 04/05/2023    8:19 AM 03/15/2023    8:20 AM 02/09/2022    8:09 AM  BMP  Glucose 70 - 99 mg/dL 94  89  90   BUN 6 - 23 mg/dL 14  18  19    Creatinine 0.40 - 1.50 mg/dL 7.82  9.56  2.13   Sodium 135 - 145 mEq/L 139  140  140   Potassium 3.5 - 5.1 mEq/L 3.8  3.9  4.2    Chloride 96 - 112 mEq/L 102  101  101   CO2 19 - 32 mEq/L 27  31  32   Calcium  8.4 - 10.5 mg/dL 9.5  9.6  08.6       Latest Ref Rng & Units 04/05/2023    8:19 AM 03/15/2023    8:20 AM 02/09/2022    8:09 AM  CMP  Glucose 70 - 99 mg/dL 94  89  90   BUN 6 - 23 mg/dL 14  18  19    Creatinine 0.40 - 1.50 mg/dL 5.78  4.69  6.29   Sodium 135 - 145 mEq/L 139  140  140   Potassium 3.5 - 5.1 mEq/L 3.8  3.9  4.2   Chloride 96 - 112 mEq/L 102  101  101   CO2 19 - 32 mEq/L 27  31  32   Calcium  8.4 - 10.5 mg/dL 9.5  9.6  52.8   Total Protein 6.0 - 8.3 g/dL  6.1  6.9   Total Bilirubin 0.2 - 1.2 mg/dL  1.1  0.8   Alkaline Phos 39 - 117 U/L  71  86   AST 0 - 37 U/L  17  23   ALT 0 - 53 U/L  16  30     Lab Results  Component Value Date   CHOL 78 03/15/2023   HDL 29.80 (L) 03/15/2023   LDLCALC 32 03/15/2023   LDLDIRECT 66.7 11/30/2011   TRIG 77.0 03/15/2023   CHOLHDL 3 03/15/2023   No results for input(s): "LIPOA" in the last 8760 hours. No components found for: "NTPROBNP" No results for input(s): "PROBNP" in the last 8760 hours. Recent Labs    03/15/23 0820  TSH 2.75    Physical Exam:    Today's Vitals   02/29/24 1021  BP: 120/74  Pulse: 61  Resp: 16  SpO2: 94%  Weight: 222 lb 9.6 oz (101 kg)  Height: 5\' 10"  (1.778 m)   Body mass index is 31.94 kg/m. Wt Readings from Last 3 Encounters:  02/29/24 222 lb 9.6 oz (101 kg)  08/16/23 208 lb 12.8 oz (94.7 kg)  07/07/23 215 lb (97.5 kg)    Physical Exam  Constitutional: No distress.  hemodynamically stable  Neck: No JVD present.  Cardiovascular: Normal rate, regular rhythm, S1 normal and S2 normal. Exam reveals no gallop, no S3 and no S4.  No murmur heard. Pulmonary/Chest: Effort normal and breath sounds normal. No stridor. He has no wheezes. He has no rales.  Musculoskeletal:        General: No edema.  Cervical back: Neck supple.  Skin: Skin is warm.     Impression & Recommendation(s):  Impression:   ICD-10-CM    1. Coronary artery disease involving native coronary artery of native heart without angina pectoris  I25.10 EKG 12-Lead    ECHOCARDIOGRAM COMPLETE    MYOCARDIAL PERFUSION IMAGING    Cardiac Stress Test: Informed Consent Details: Physician/Practitioner Attestation; Transcribe to consent form and obtain patient signature    2. Hx of CABG  Z95.1     3. History of coronary angioplasty with insertion of stent  Z95.5     4. Infrarenal abdominal aortic aneurysm (AAA) without rupture (HCC)  I71.43 VAS US  AAA DUPLEX    5. Pure hypercholesterolemia  E78.00     6. Essential hypertension  I10     7. Preop cardiovascular exam  Z01.810 ECHOCARDIOGRAM COMPLETE    MYOCARDIAL PERFUSION IMAGING    Cardiac Stress Test: Informed Consent Details: Physician/Practitioner Attestation; Transcribe to consent form and obtain patient signature       Recommendation(s):  Coronary artery disease involving native coronary artery of native heart without angina pectoris Hx of CABG, redo CABG History of coronary angioplasty with insertion of stent Clinically denies anginal chest pain or heart failure symptoms.  Overall functional capacity remains relatively stable. However, he has significant past cardiac history no recent ischemic evaluation. In the setting of no active discomfort ideally would hold off on additional testing; however, given his complex past cardiovascular history recommend exercise nuclear stress test and echocardiogram in September 2025 prior to his elective surgery Continue antiplatelet therapy. Continue Lipitor  80 mg p.o. nightly. Continue Zetia  10 mg p.o. daily Continue Lasix  20 mg p.o. as needed basis. Continue hydrochlorothiazide  12.5 mg p.o. daily. Continue lisinopril  20 mg p.o. daily. Continue Toprol -XL 25 mg p.o. daily  Infrarenal abdominal aortic aneurysm (AAA) without rupture [facility]) Last vascular ultrasound October 2024. Recommended 2-year follow-up study we will arrange a study in  2026 Clinically remains asymptomatic. Is aware of symptoms that suggest onset of or progression of aortic syndromes  Pure hypercholesterolemia Most recent lipid profile from April 2024 independently reviewed. Patient states that he had his yearly physical with PCP later this month, will send us  a copy for reference. Continue Lipitor  and Zetia   Essential hypertension Office blood pressures are very well-controlled. Medications discussed above  Preop cardiovascular exam See above. Further recommendations to follow   Orders Placed:  Orders Placed This Encounter  Procedures   Cardiac Stress Test: Informed Consent Details: Physician/Practitioner Attestation; Transcribe to consent form and obtain patient signature    Physician/Practitioner attestation of informed consent for procedure/surgical case:   I, the physician/practitioner, attest that I have discussed with the patient the benefits, risks, side effects, alternatives, likelihood of achieving goals and potential problems during recovery for the procedure that I have provided informed consent.    Procedure:   Nuclear Exercsise Stress Test    Indication/Reason:   Coronary artery disease, pre-operative clearance   MYOCARDIAL PERFUSION IMAGING    Standing Status:   Future    Expiration Date:   02/28/2025    Patient weight in lbs:   222    Where should this be performed?:   [facility]    Type of stress:   Exercise   EKG 12-Lead   ECHOCARDIOGRAM COMPLETE    Standing Status:   Future    Expected Date:   03/07/2024    Expiration Date:   02/28/2025    Where should this test  be performed:   [facility])    Does the patient weigh less than or greater than 250 lbs?:   Patient weighs less than 250 lbs    Perflutren  DEFINITY  (image enhancing agent) should be administered unless hypersensitivity or allergy exist:   Administer Perflutren     Reason for exam-Echo:   Other-Full Diagnosis List    Full  ICD-10/Reason for Exam:   CAD (coronary artery disease) [784696]    Full ICD-10/Reason for Exam:   Pre-operative clearance [295284]     Final Medication List:   No orders of the defined types were placed in this encounter.   There are no discontinued medications.   Current Outpatient Medications:    acetaminophen  (TYLENOL ) 325 MG tablet, Take 650 mg by mouth every 6 (six) hours as needed for moderate pain or headache. , Disp: , Rfl:    albuterol (VENTOLIN HFA) 108 (90 Base) MCG/ACT inhaler, as needed., Disp: , Rfl:    atorvastatin  (LIPITOR ) 80 MG tablet, TAKE 1 TABLET DAILY, Disp: 90 tablet, Rfl: 3   azelastine  (ASTELIN ) 0.1 % nasal spray, Place 1 spray into both nostrils 2 (two) times daily. Use in each nostril as directed, Disp: 30 mL, Rfl: 12   buPROPion  (WELLBUTRIN  XL) 300 MG 24 hr tablet, TAKE 1 TABLET BY MOUTH DAILY, Disp: 90 tablet, Rfl: 3   calcipotriene  (DOVONOX) 0.005 % cream, Apply 1 application topically 2 (two) times daily., Disp: , Rfl:    clobetasol  cream (TEMOVATE ) 0.05 %, Apply 1 application topically daily., Disp: , Rfl:    clopidogrel  (PLAVIX ) 75 MG tablet, Take 1 tablet (75 mg total) by mouth daily., Disp: 90 tablet, Rfl: 2   cyclobenzaprine  (FLEXERIL ) 10 MG tablet, Take 1 tablet (10 mg total) by mouth at bedtime., Disp: 15 tablet, Rfl: 0   ezetimibe  (ZETIA ) 10 MG tablet, Take 1 tablet (10 mg total) by mouth daily., Disp: 90 tablet, Rfl: 3   furosemide  (LASIX ) 20 MG tablet, TAKE 1 TABLET DAILY AS     NEEDED FOR LEG SWELLING, Disp: 90 tablet, Rfl: 1   hydrochlorothiazide  (HYDRODIURIL ) 25 MG tablet, Take 0.5 tablets (12.5 mg total) by mouth daily., Disp: 45 tablet, Rfl: 3   lisinopril  (ZESTRIL ) 20 MG tablet, Take 1 tablet (20 mg total) by mouth daily., Disp: 90 tablet, Rfl: 3   Melatonin 10 MG TABS, Take 20 mg by mouth at bedtime., Disp: , Rfl:    metoprolol  succinate (TOPROL -XL) 25 MG 24 hr tablet, Take 1 tablet (25 mg total) by mouth daily., Disp: 90 tablet, Rfl: 3    Multiple Vitamin (MULTIVITAMIN) capsule, Take 1 capsule by mouth daily., Disp: , Rfl:    nitroGLYCERIN  (NITROSTAT ) 0.4 MG SL tablet, Place 1 tablet (0.4 mg total) under the tongue every 5 (five) minutes as needed., Disp: 25 tablet, Rfl: 0   sildenafil  (VIAGRA ) 100 MG tablet, Take 100 mg by mouth daily as needed for erectile dysfunction., Disp: , Rfl:    traZODone  (DESYREL ) 150 MG tablet, Take 1 tablet (150 mg total) by mouth at bedtime., Disp: 90 tablet, Rfl: 3   VYZULTA 0.024 % SOLN, Apply to eye., Disp: , Rfl:   Consent:   Informed Consent   Shared Decision Making/Informed Consent The risks [chest pain, shortness of breath, cardiac arrhythmias, dizziness, blood pressure fluctuations, myocardial infarction, stroke/transient ischemic attack, nausea, vomiting, allergic reaction, radiation exposure, metallic taste sensation and life-threatening complications (estimated to be 1 in 10,000)], benefits (risk stratification, diagnosing coronary artery disease, treatment guidance) and alternatives  of a nuclear stress test were discussed in detail with Mr. Routt and he agrees to proceed.     Disposition:   6 months follow-up sooner if needed  His questions and concerns were addressed to his satisfaction. He voices understanding of the recommendations provided during this encounter.    Signed, Olinda Bertrand, DO, [facility] [place]  [facility]  [address] [place], [place] [address]

## 2024-02-29 NOTE — Patient Instructions (Addendum)
 Medication Instructions:  Your physician recommends that you continue on your current medications as directed. Please refer to the Current Medication list given to you today.  *If you need a refill on your cardiac medications before your next appointment, please call your pharmacy*  Lab Work: None ordered today. If you have labs (blood work) drawn today and your tests are completely normal, you will receive your results only by: MyChart Message (if you have MyChart) OR A paper copy in the mail If you have any lab test that is abnormal or we need to change your treatment, we will call you to review the results.  Testing/Procedures: Your physician has requested that you have en exercise stress myoview. For further information please visit [email or web address] Please follow instruction sheet, as given.  Your physician has requested that you have a vascular ultrasound in April 2026 to evaluate your abdominal aortic aneurysm.   Your physician has requested that you have an echocardiogram. Echocardiography is a painless test that uses sound waves to create images of your heart. It provides your doctor with information about the size and shape of your heart and how well your heart's chambers and valves are working. This procedure takes approximately one hour. There are no restrictions for this procedure. Please do NOT wear cologne, perfume, aftershave, or lotions (deodorant is allowed). Please arrive 15 minutes prior to your appointment time.  Please note: We ask at that you not bring children with you during ultrasound (echo/ vascular) testing. Due to room size and safety concerns, children are not allowed in the ultrasound rooms during exams. Our front office staff cannot provide observation of children in our lobby area while testing is being conducted. An adult accompanying a patient to their appointment will only be allowed in the ultrasound room at the discretion of the ultrasound technician  under special circumstances. We apologize for any inconvenience.   Follow-Up: At [facility], you and your health needs are our priority.  As part of our continuing mission to provide you with exceptional heart care, we have created designated Provider Care Teams.  These Care Teams include your primary Cardiologist (physician) and Advanced Practice Providers (APPs -  Physician Assistants and Nurse Practitioners) who all work together to provide you with the care you need, when you need it.  We recommend signing up for the patient portal called "MyChart".  Sign up information is provided on this After Visit Summary.  MyChart is used to connect with patients for Virtual Visits (Telemedicine).  Patients are able to view lab/test results, encounter notes, upcoming appointments, etc.  Non-urgent messages can be sent to your provider as well.   To learn more about what you can do with MyChart, go to [email or web address].    Your next appointment:   6 month(s)  The format for your next appointment:   In Person  Provider:   Olinda Bertrand, [facility]  Other Instructions Exercise Myoview (Stress Test) Instructions  Please arrive 15 minutes prior to your appointment time for registration and insurance purposes.   The test will take approximately 3 to 4 hours to complete; you may bring reading material.  If someone comes with you to your appointment, they will need to remain in the main lobby due to limited space in the testing area. **If you are pregnant or breastfeeding, please notify the nuclear lab prior to your appointment**   How to prepare for your Myocardial Perfusion Test: Do not eat or drink 3 hours prior to your test, except you  may have water. Do not consume products containing caffeine (regular or decaffeinated) 12 hours prior to your test. (ex: coffee, chocolate, sodas, tea). Do bring a list of your current medications with you.  If not listed below, you may take your medications as  normal. Do wear comfortable clothes (no dresses or overalls) and walking shoes, tennis shoes preferred (No heels or open toe shoes are allowed). Do NOT wear cologne, perfume, aftershave, or lotions (deodorant is allowed). If these instructions are not followed, your test will have to be rescheduled.   Please report to [address] [place] [address] [place]. for your test.  If you have questions or concerns about your appointment, you can call the Nuclear Lab at [phone].   If you cannot keep your appointment, please provide 24 hours notification to the Nuclear Lab, to avoid a possible $50 charge to your account. ---------------------------------------------------------------------------------   1st Floor: - Lobby - Registration  - Pharmacy  - Lab - Cafe  2nd Floor: - PV Lab - Diagnostic Testing (echo, CT, nuclear med)  3rd Floor: - Vacant  4th Floor: - TCTS (cardiothoracic surgery) - AFib Clinic - Structural Heart Clinic - Vascular Surgery  - Vascular Ultrasound  5th Floor: - HeartCare Cardiology (general and EP) - Clinical Pharmacy for coumadin, hypertension, lipid, weight-loss medications, and med management appointments    Valet parking services will be available as well.

## 2024-02-29 NOTE — Telephone Encounter (Signed)
 Needing to schedule Abdominal Aeortic anuerism VASC. LVM to r/s for April 2026 on 02/29/2024 around 3pm.

## 2024-03-05 ENCOUNTER — Encounter: Payer: Self-pay | Admitting: Cardiology

## 2024-03-08 DIAGNOSIS — F331 Major depressive disorder, recurrent, moderate: Secondary | ICD-10-CM | POA: Diagnosis not present

## 2024-03-12 DIAGNOSIS — H40053 Ocular hypertension, bilateral: Secondary | ICD-10-CM | POA: Diagnosis not present

## 2024-03-12 DIAGNOSIS — H35033 Hypertensive retinopathy, bilateral: Secondary | ICD-10-CM | POA: Diagnosis not present

## 2024-03-12 DIAGNOSIS — H401131 Primary open-angle glaucoma, bilateral, mild stage: Secondary | ICD-10-CM | POA: Diagnosis not present

## 2024-03-12 DIAGNOSIS — Z961 Presence of intraocular lens: Secondary | ICD-10-CM | POA: Diagnosis not present

## 2024-03-15 DIAGNOSIS — L570 Actinic keratosis: Secondary | ICD-10-CM | POA: Diagnosis not present

## 2024-03-15 DIAGNOSIS — Z85828 Personal history of other malignant neoplasm of skin: Secondary | ICD-10-CM | POA: Diagnosis not present

## 2024-03-15 DIAGNOSIS — D225 Melanocytic nevi of trunk: Secondary | ICD-10-CM | POA: Diagnosis not present

## 2024-03-15 DIAGNOSIS — C44712 Basal cell carcinoma of skin of right lower limb, including hip: Secondary | ICD-10-CM | POA: Diagnosis not present

## 2024-03-15 DIAGNOSIS — L578 Other skin changes due to chronic exposure to nonionizing radiation: Secondary | ICD-10-CM | POA: Diagnosis not present

## 2024-03-15 DIAGNOSIS — L821 Other seborrheic keratosis: Secondary | ICD-10-CM | POA: Diagnosis not present

## 2024-03-15 DIAGNOSIS — D3617 Benign neoplasm of peripheral nerves and autonomic nervous system of trunk, unspecified: Secondary | ICD-10-CM | POA: Diagnosis not present

## 2024-03-15 DIAGNOSIS — D485 Neoplasm of uncertain behavior of skin: Secondary | ICD-10-CM | POA: Diagnosis not present

## 2024-03-15 DIAGNOSIS — D044 Carcinoma in situ of skin of scalp and neck: Secondary | ICD-10-CM | POA: Diagnosis not present

## 2024-03-20 DIAGNOSIS — K080 Exfoliation of teeth due to systemic causes: Secondary | ICD-10-CM | POA: Diagnosis not present

## 2024-03-21 ENCOUNTER — Ambulatory Visit (INDEPENDENT_AMBULATORY_CARE_PROVIDER_SITE_OTHER): Admitting: Adult Health

## 2024-03-21 ENCOUNTER — Encounter: Payer: Self-pay | Admitting: Adult Health

## 2024-03-21 VITALS — BP 120/80 | HR 65 | Temp 98.4°F | Ht 70.0 in | Wt 221.0 lb

## 2024-03-21 DIAGNOSIS — I10 Essential (primary) hypertension: Secondary | ICD-10-CM

## 2024-03-21 DIAGNOSIS — Z0000 Encounter for general adult medical examination without abnormal findings: Secondary | ICD-10-CM | POA: Diagnosis not present

## 2024-03-21 DIAGNOSIS — E7800 Pure hypercholesterolemia, unspecified: Secondary | ICD-10-CM | POA: Diagnosis not present

## 2024-03-21 DIAGNOSIS — I2510 Atherosclerotic heart disease of native coronary artery without angina pectoris: Secondary | ICD-10-CM

## 2024-03-21 DIAGNOSIS — Z125 Encounter for screening for malignant neoplasm of prostate: Secondary | ICD-10-CM | POA: Diagnosis not present

## 2024-03-21 DIAGNOSIS — F5101 Primary insomnia: Secondary | ICD-10-CM | POA: Diagnosis not present

## 2024-03-21 DIAGNOSIS — F339 Major depressive disorder, recurrent, unspecified: Secondary | ICD-10-CM

## 2024-03-21 LAB — COMPREHENSIVE METABOLIC PANEL WITH GFR
ALT: 26 U/L (ref 0–53)
AST: 25 U/L (ref 0–37)
Albumin: 4.1 g/dL (ref 3.5–5.2)
Alkaline Phosphatase: 83 U/L (ref 39–117)
BUN: 13 mg/dL (ref 6–23)
CO2: 30 meq/L (ref 19–32)
Calcium: 9.5 mg/dL (ref 8.4–10.5)
Chloride: 102 meq/L (ref 96–112)
Creatinine, Ser: 1.28 mg/dL (ref 0.40–1.50)
GFR: 54.26 mL/min — ABNORMAL LOW (ref >60.00)
Glucose, Bld: 96 mg/dL (ref 70–99)
Potassium: 4 meq/L (ref 3.5–5.1)
Sodium: 139 meq/L (ref 135–145)
Total Bilirubin: 1 mg/dL (ref 0.2–1.2)
Total Protein: 6.4 g/dL (ref 6.0–8.3)

## 2024-03-21 LAB — LIPID PANEL
Cholesterol: 94 mg/dL (ref 0–200)
HDL: 34.6 mg/dL — ABNORMAL LOW (ref >39.00)
LDL Cholesterol: 45 mg/dL (ref 0–99)
NonHDL: 59.52
Total CHOL/HDL Ratio: 3
Triglycerides: 71 mg/dL (ref 0.0–149.0)
VLDL: 14.2 mg/dL (ref 0.0–40.0)

## 2024-03-21 LAB — CBC
HCT: 48.8 % (ref 39.0–52.0)
Hemoglobin: 16.4 g/dL (ref 13.0–17.0)
MCHC: 33.7 g/dL (ref 30.0–36.0)
MCV: 93.9 fl (ref 78.0–100.0)
Platelets: 160 10*3/uL (ref 150.0–400.0)
RBC: 5.2 Mil/uL (ref 4.22–5.81)
RDW: 13.8 % (ref 11.5–15.5)
WBC: 7.5 10*3/uL (ref 4.0–10.5)

## 2024-03-21 LAB — TSH: TSH: 3.43 u[IU]/mL (ref 0.35–5.50)

## 2024-03-21 LAB — PSA: PSA: 1.5 ng/mL (ref 0.10–4.00)

## 2024-03-21 NOTE — Progress Notes (Signed)
 Subjective:    Patient ID: Dave Robles, male    DOB: 1946/01/14, 78 y.o.   MRN: [id number]  HPI Patient presents for yearly preventative medicine examination. He is a pleasant 78 year old male who  has a past medical history of AAA (abdominal aortic aneurysm) (HCC), AAA (abdominal aortic aneurysm) without rupture (HCC) (01/19/2017), Adjustment disorder with depressed mood (03/04/2009), Arthritis, Colon polyps, CORONARY ARTERY DISEASE (07/10/2007), Coronary artery disease involving native coronary artery of native heart without angina pectoris (07/10/2007), Depression, DOE (dyspnea on exertion), Episodic mood disorder (HCC) (08/01/2014), ERECTILE DYSFUNCTION (07/10/2007), Extrinsic asthma, unspecified (09/10/2008), HYPERLIPIDEMIA (07/10/2007), HYPERTENSION (07/10/2007), Myocardial infarction (HCC) (2016), OA (osteoarthritis) of hip (04/19/2018), Onychomycosis (05/14/2014), Pain in joint of left knee (01/19/2017), Personal history of colonic polyps (07/08/2014), S/P CABG x 2 (05/27/2020), and TOBACCO USE (12/15/2009).  CAD/hyperlipidemia-history of CABG x3 with LIMA to LAD, SVG to DX and SVG to distal RCA in 1991.  He had non-ST elevation MI while vacationing in [place]  in 2016.  At this time was Noted to have normal left main, 90% proximal LAD, 99% mid LAD with patent LIMA to the mid LAD and patent SVG to DX. 2021 by cardiology due to DOE at which time he had a cardiac cath on 05/27/2020 he underwent a redo coronary to bypass grafting x2.  He is currently prescribed Lipitor  80 mg daily and Plavix  75 mg daily.  He is followed by cardiology on a routine basis.  Denies myalgia, fatigue, shortness of breath, or chest pain Lab Results  Component Value Date   CHOL 78 03/15/2023   HDL 29.80 (L) 03/15/2023   LDLCALC 32 03/15/2023   LDLDIRECT 66.7 11/30/2011   TRIG 77.0 03/15/2023   CHOLHDL 3 03/15/2023   Essential hypertension-managed with  lisinopril  20 mg, Lasix  20 mg every other day, and metoprolol  25 mg twice  daily. His cardiologist in [place]  started him on HCTZ 12.5 mg for elevated blood pressure readings.  He denies dizziness, lightheadedness, chest pain, syncopal episodes. BP Readings from Last 3 Encounters:  03/21/24 120/80  02/29/24 120/74  08/16/23 120/68   Depression- Takes Wellbutrin  300 mg ER. He also sees a therapist and finds this helpful   Insomnia -prescribed trazodone  150 mg QHS. Feels as though he gets well rested sleep most night s  All immunizations and health maintenance protocols were reviewed with the patient and needed orders were placed.  Appropriate screening laboratory values were ordered for the patient including screening of hyperlipidemia, renal function and hepatic function. If indicated by BPH, a PSA was ordered.  Medication reconciliation,  past medical history, social history, problem list and allergies were reviewed in detail with the patient  Goals were established with regard to weight loss, exercise, and  diet in compliance with medications.  He spends the winters in [place]  and while there engages in physical activity such as cardio and strength training classes twice a week, hiking, and using gym equipment like a stationary bike, treadmill, and elliptical. He has stayed physically active some returning.     Wt Readings from Last 3 Encounters:  03/21/24 221 lb (100.2 kg)  02/29/24 222 lb 9.6 oz (101 kg)  08/16/23 208 lb 12.8 oz (94.7 kg)   He has been seen by Dermatology last week.   He plans on having a knee replacement in October while in [place] .   Review of Systems  Constitutional: Negative.   HENT: Negative.    Eyes: Negative.   Respiratory: Negative.    Cardiovascular: Negative.  Gastrointestinal: Negative.   Endocrine: Negative.   Genitourinary: Negative.   Musculoskeletal:  Positive for arthralgias and back pain.  Skin: Negative.   Allergic/Immunologic: Negative.   Neurological: Negative.   Hematological: Negative.    Psychiatric/Behavioral: Negative.    All other systems reviewed and are negative.  Past Medical History:  Diagnosis Date   AAA (abdominal aortic aneurysm) (HCC)    AAA (abdominal aortic aneurysm) without rupture (HCC) 01/19/2017   Adjustment disorder with depressed mood 03/04/2009   Arthritis    Colon polyps    CORONARY ARTERY DISEASE 07/10/2007   Coronary artery disease involving native coronary artery of native heart without angina pectoris 07/10/2007   Qualifier: Diagnosis of  By: Ena Harries, RN, Willetta Harpin     Depression    DOE (dyspnea on exertion)    Episodic mood disorder (HCC) 08/01/2014   ERECTILE DYSFUNCTION 07/10/2007   Extrinsic asthma, unspecified 09/10/2008   HYPERLIPIDEMIA 07/10/2007   HYPERTENSION 07/10/2007   Myocardial infarction (HCC) 2016   when had angioplasty- was told had a silent MI   OA (osteoarthritis) of hip 04/19/2018   Onychomycosis 05/14/2014   Pain in joint of left knee 01/19/2017   Personal history of colonic polyps 07/08/2014   1997, 2001 8 mm transverse serrated polyp right colon 2010 07/08/2014 no polyps - repeat colon 2022      S/P CABG x 2 05/27/2020   TOBACCO USE 12/15/2009    Social History   Socioeconomic History   Marital status: Married    Spouse name: Not on file   Number of children: Not on file   Years of education: Not on file   Highest education level: Bachelor's degree (e.g., BA, AB, BS)  Occupational History   Not on file  Tobacco Use   Smoking status: Former    Current packs/day: 0.00    Types: Cigarettes    Quit date: 1991    Years since quitting: 34.3   Smokeless tobacco: Never  Vaping Use   Vaping status: Never Used  Substance and Sexual Activity   Alcohol use: Yes    Alcohol/week: 1.0 standard drink of alcohol    Types: 1 Cans of beer per week    Comment: Gin 2 x week   Drug use: No   Sexual activity: Yes    Comment: regular exercise - yes  Other Topics Concern   Not on file  Social History Narrative   Retired from being an Network engineer  of a small business    Married    No kids       He likes to play golf and rides bike       Social Drivers of Corporate investment banker Strain: Low Risk  (07/07/2023)   Overall Financial Resource Strain (CARDIA)    Difficulty of Paying Living Expenses: Not hard at all  Food Insecurity: No Food Insecurity (07/07/2023)   Hunger Vital Sign    Worried About Running Out of Food in the Last Year: Never true    Ran Out of Food in the Last Year: Never true  Transportation Needs: No Transportation Needs (07/07/2023)   PRAPARE - Administrator, Civil Service (Medical): No    Lack of Transportation (Non-Medical): No  Physical Activity: Sufficiently Active (07/07/2023)   Exercise Vital Sign    Days of Exercise per Week: 4 days    Minutes of Exercise per Session: 50 min  Stress: No Stress Concern Present (07/07/2023)   [employer] of Occupational Health -  Occupational Stress Questionnaire    Feeling of Stress : Not at all  Social Connections: Unknown (07/07/2023)   Social Connection and Isolation Panel [NHANES]    Frequency of Communication with Friends and Family: More than three times a week    Frequency of Social Gatherings with Friends and Family: Three times a week    Attends Religious Services: Patient declined    Active Member of Clubs or Organizations: No    Attends Banker Meetings: Not on file    Marital Status: Married  Intimate Partner Violence: Not At Risk (03/11/2023)   Humiliation, Afraid, Rape, and Kick questionnaire    Fear of Current or Ex-Partner: No    Emotionally Abused: No    Physically Abused: No    Sexually Abused: No    Past Surgical History:  Procedure Laterality Date   COLONOSCOPY     CORONARY ANGIOPLASTY  12/2014   CORONARY ARTERY BYPASS GRAFT  11/15/89   5 vessels   CORONARY ARTERY BYPASS GRAFT N/A 05/27/2020   Procedure: REDO CORONARY ARTERY BYPASS GRAFTING (CABG), ON PUMP, TIMES TWO, USING RIGHT INTERNAL MAMMARY ARTERY AND LEFT  RADIAL ARTERY (OPEN);  Surgeon: Norita Beauvais, MD;  Location: [facility] OR;  Service: Open Heart Surgery;  Laterality: N/A;  Left Radial Artery to PDA FREE RIMA to OM   HYDROCELE EXCISION / REPAIR  2011   INGUINAL HERNIA REPAIR Left 1985   LEFT HEART CATH AND CORS/GRAFTS ANGIOGRAPHY N/A 05/13/2020   Procedure: LEFT HEART CATH AND CORS/GRAFTS ANGIOGRAPHY;  Surgeon: Arty Binning, MD;  Location: [facility] INVASIVE CV LAB;  Service: Cardiovascular;  Laterality: N/A;   MEDIASTERNOTOMY N/A 05/27/2020   Procedure: MEDIAN STERNOTOMY;  Surgeon: Norita Beauvais, MD;  Location: [facility] OR;  Service: Open Heart Surgery;  Laterality: N/A;  Redo Median Sternotomy   RADIAL ARTERY HARVEST Left 05/27/2020   Procedure: RADIAL ARTERY HARVEST;  Surgeon: Norita Beauvais, MD;  Location[facility] OR;  Service: Open Heart Surgery;  Laterality: Left;   TEE WITHOUT CARDIOVERSION N/A 05/27/2020   Procedure: TRANSESOPHAGEAL ECHOCARDIOGRAM (TEE);  Surgeon: Norita Beauvais, MD;  Location: [facility] OR;  Service: Open Heart Surgery;  Laterality: N/A;   TOTAL HIP ARTHROPLASTY Right 04/19/2018   Procedure: RIGHT TOTAL HIP ARTHROPLASTY ANTERIOR APPROACH;  Surgeon: Liliane Rei, MD;  Location: [facility] ORS;  Service: Orthopedics;  Laterality: Right;    Family History  Problem Relation Age of Onset   Coronary artery disease Father        cabg   Cancer Mother        Breast and or lung    Depression Mother    Hyperlipidemia Other    Hypertension Other    Depression Paternal Grandfather    Colon cancer Neg Hx     No Known Allergies  Current Outpatient Medications on File Prior to Visit  Medication Sig Dispense Refill   atorvastatin  (LIPITOR ) 80 MG tablet TAKE 1 TABLET DAILY 90 tablet 3   azelastine  (ASTELIN ) 0.1 % nasal spray Place 1 spray into both nostrils 2 (two) times daily. Use in each nostril as directed 30 mL 12   buPROPion  (WELLBUTRIN  XL) 300 MG 24 hr tablet TAKE 1 TABLET BY MOUTH DAILY 90 tablet 3   clopidogrel  (PLAVIX ) 75 MG tablet  Take 1 tablet (75 mg total) by mouth daily. 90 tablet 2   ezetimibe  (ZETIA ) 10 MG tablet Take 1 tablet (10 mg total) by mouth daily. 90 tablet 3   hydrochlorothiazide  (HYDRODIURIL ) 25 MG tablet  Take 0.5 tablets (12.5 mg total) by mouth daily. 45 tablet 3   lisinopril  (ZESTRIL ) 20 MG tablet Take 1 tablet (20 mg total) by mouth daily. 90 tablet 3   Melatonin 10 MG TABS Take 20 mg by mouth at bedtime.     metoprolol  succinate (TOPROL -XL) 25 MG 24 hr tablet Take 1 tablet (25 mg total) by mouth daily. 90 tablet 3   Multiple Vitamin (MULTIVITAMIN) capsule Take 1 capsule by mouth daily.     sildenafil  (VIAGRA ) 100 MG tablet Take 100 mg by mouth daily as needed for erectile dysfunction.     traZODone  (DESYREL ) 150 MG tablet Take 1 tablet (150 mg total) by mouth at bedtime. 90 tablet 3   acetaminophen  (TYLENOL ) 325 MG tablet Take 650 mg by mouth every 6 (six) hours as needed for moderate pain or headache.  (Patient not taking: Reported on 03/21/2024)     albuterol (VENTOLIN HFA) 108 (90 Base) MCG/ACT inhaler as needed. (Patient not taking: Reported on 03/21/2024)     chlorhexidine  (PERIDEX ) 0.12 % solution FLOSS, BRUSH AND RINSE WITH 1 CAPFUL AND SPIT     cyclobenzaprine  (FLEXERIL ) 10 MG tablet Take 1 tablet (10 mg total) by mouth at bedtime. (Patient not taking: Reported on 03/21/2024) 15 tablet 0   furosemide  (LASIX ) 20 MG tablet TAKE 1 TABLET DAILY AS     NEEDED FOR LEG SWELLING (Patient not taking: Reported on 03/21/2024) 90 tablet 1   nitroGLYCERIN  (NITROSTAT ) 0.4 MG SL tablet Place 1 tablet (0.4 mg total) under the tongue every 5 (five) minutes as needed. (Patient not taking: Reported on 03/21/2024) 25 tablet 0   No current facility-administered medications on file prior to visit.    BP 120/80   Pulse 65   Temp 98.4 F (36.9 C) (Oral)   Ht 5\' 10"  (1.778 m)   Wt 221 lb (100.2 kg)   SpO2 95%   BMI 31.71 kg/m       Objective:   Physical Exam Vitals and nursing note reviewed.  Constitutional:       General: He is not in acute distress.    Appearance: Normal appearance. He is obese. He is not ill-appearing.  HENT:     Head: Normocephalic and atraumatic.     Right Ear: Tympanic membrane, ear canal and external ear normal. There is no impacted cerumen.     Left Ear: Tympanic membrane, ear canal and external ear normal. There is no impacted cerumen.     Nose: Nose normal. No congestion or rhinorrhea.     Mouth/Throat:     Mouth: Mucous membranes are moist.     Pharynx: Oropharynx is clear.  Eyes:     Extraocular Movements: Extraocular movements intact.     Conjunctiva/sclera: Conjunctivae normal.     Pupils: Pupils are equal, round, and reactive to light.  Neck:     Vascular: No carotid bruit.  Cardiovascular:     Rate and Rhythm: Normal rate and regular rhythm.     Pulses: Normal pulses.     Heart sounds: No murmur heard.    No friction rub. No gallop.  Pulmonary:     Effort: Pulmonary effort is normal.     Breath sounds: Normal breath sounds.  Abdominal:     General: Abdomen is flat. Bowel sounds are normal. There is no distension.     Palpations: Abdomen is soft. There is no mass.     Tenderness: There is no abdominal tenderness. There is no guarding or rebound.  Hernia: No hernia is present.  Musculoskeletal:        General: Normal range of motion.     Cervical back: Normal range of motion and neck supple.  Lymphadenopathy:     Cervical: No cervical adenopathy.  Skin:    General: Skin is warm and dry.     Capillary Refill: Capillary refill takes less than 2 seconds.  Neurological:     General: No focal deficit present.     Mental Status: He is alert and oriented to person, place, and time.  Psychiatric:        Mood and Affect: Mood normal.        Behavior: Behavior normal.        Thought Content: Thought content normal.        Judgment: Judgment normal.        Assessment & Plan:  1. Routine general medical examination at a health care facility  (Primary) Today patient counseled on age appropriate routine health concerns for screening and prevention, each reviewed and up to date or declined. Immunizations reviewed and up to date or declined. Labs ordered and reviewed. Risk factors for depression reviewed and negative. Hearing function and visual acuity are intact. ADLs screened and addressed as needed. Functional ability and level of safety reviewed and appropriate. Education, counseling and referrals performed based on assessed risks today. Patient provided with a copy of personalized plan for preventive services. - Continue to eat healthy and exercise - Follow up in one year or sooner if needed   2. Coronary artery disease involving native coronary artery of native heart without angina pectoris - Continue statin and Plavix .  - Lipid panel; Future - TSH; Future - CBC; Future - Comprehensive metabolic panel with GFR; Future  3. Pure hypercholesterolemia - Continue statin  - Lipid panel; Future - TSH; Future - CBC; Future - Comprehensive metabolic panel with GFR; Future  4. Essential hypertension - Well controlled. No change in medication  - Lipid panel; Future - TSH; Future - CBC; Future - Comprehensive metabolic panel with GFR; Future  5. Primary insomnia - Continue with Trazodone   - Lipid panel; Future - TSH; Future - CBC; Future - Comprehensive metabolic panel with GFR; Future  6. Depression, recurrent (HCC) - Continue with Trazadone  - Lipid panel; Future - TSH; Future - CBC; Future - Comprehensive metabolic panel with GFR; Future  7. Prostate cancer screening  - PSA; Future  Alto Atta, NP

## 2024-03-21 NOTE — Patient Instructions (Signed)
 It was great seeing you today   We will follow up with you regarding your lab work   Please let me know if you need anything

## 2024-03-29 DIAGNOSIS — F331 Major depressive disorder, recurrent, moderate: Secondary | ICD-10-CM | POA: Diagnosis not present

## 2024-04-12 DIAGNOSIS — F331 Major depressive disorder, recurrent, moderate: Secondary | ICD-10-CM | POA: Diagnosis not present

## 2024-04-30 ENCOUNTER — Telehealth: Payer: Self-pay | Admitting: Cardiology

## 2024-04-30 DIAGNOSIS — I714 Abdominal aortic aneurysm, without rupture, unspecified: Secondary | ICD-10-CM

## 2024-04-30 MED ORDER — CLOPIDOGREL BISULFATE 75 MG PO TABS
75.0000 mg | ORAL_TABLET | Freq: Every day | ORAL | 3 refills | Status: AC
Start: 2024-04-30 — End: ?

## 2024-04-30 NOTE — Telephone Encounter (Signed)
 Spoke with patient and he states he rotates his cardiologist in our office and one in [place] .   Dr. Knute Perla ordered Colchicine 0.6 mg and will need a refill.  He would like to know are you ok ordering medication for him?

## 2024-04-30 NOTE — Telephone Encounter (Signed)
 Dave Robles,  Colchicine can be used for multiple medical conditions. Please find out why colchicine has been prescribed to him. You can update his MAR. Until I do not know what the reason why colchicine is prescribed we will hold off on refilling it for now.  [name] Clinchco, DO, [facility]

## 2024-04-30 NOTE — Telephone Encounter (Signed)
 Pt c/o medication issue:  1. Name of Medication: Colchicine 0.6 mgs   2. How are you currently taking this medication (dosage and times per day)? 1 tablet daily, 6 days a week  3. Are you having a reaction (difficulty breathing--STAT)? No   4. What is your medication issue? Patient states his out of state cardiologist Dr. Kita Perish with [facility] in [place]  was previously prescribing this med. He is requesting it be added to his current med list and refilled by the office. Please advise.

## 2024-04-30 NOTE — Telephone Encounter (Signed)
 RX sent to requested Pharmacy

## 2024-04-30 NOTE — Telephone Encounter (Signed)
*  STAT* If patient is at the pharmacy, call can be transferred to refill team.   1. Which medications need to be refilled? (please list name of each medication and dose if known)   clopidogrel  (PLAVIX ) 75 MG tablet    2. Would you like to learn more about the convenience, safety, & potential cost savings by using the [facility]? N/A    3. Are you open to using the [facility] (Type [facility]. N/A   4. Which pharmacy/location (including street and city if local pharmacy) is medication to be sent to? [facility] - [place], [place] - [address]    5. Do they need a 30 day or 90 day supply? 90 day

## 2024-05-01 NOTE — Telephone Encounter (Signed)
 Spoke with pt who said he wasn't completely sure why Dr. Knute Perla prescribed the Colchicine. OV note from [facility] from 10/17/23 stated the pt was started on it for additional CAD risk reduction.

## 2024-05-03 NOTE — Telephone Encounter (Signed)
 Dr. Knute Perla to refill   Olinda Bertrand, DO, [facility]

## 2024-05-03 NOTE — Telephone Encounter (Signed)
 Spoke with pt an explained that Dr. Knute Perla will need to refill this medication for the pt. Pt stated it had already been taken care of by the other office.

## 2024-05-10 DIAGNOSIS — F331 Major depressive disorder, recurrent, moderate: Secondary | ICD-10-CM | POA: Diagnosis not present

## 2024-05-15 ENCOUNTER — Other Ambulatory Visit: Payer: Self-pay | Admitting: Adult Health

## 2024-05-15 NOTE — Telephone Encounter (Unsigned)
 Copied from CRM [id number]. Topic: Clinical - Medication Refill >> May 15, 2024  4:43 PM Donee H wrote: Medication:  traZODone  (DESYREL ) 150 MG tablet Has the patient contacted their pharmacy? No (Agent: If no, request that the patient contact the pharmacy for the refill. If patient does not wish to contact the pharmacy document the reason why and proceed with request.) (Agent: If yes, when and what did the pharmacy advise?)  This is the patient's preferred pharmacy:  [facility] - [place], [place] - [address] [place] [address] Phone: [phone] Fax: [phone]  Is this the correct pharmacy for this prescription? Yes If no, delete pharmacy and type the correct one.   Has the prescription been filled recently? No  Is the patient out of the medication? No  Has the patient been seen for an appointment in the last year OR does the patient have an upcoming appointment? Yes  Can we respond through MyChart? Yes  Agent: Please be advised that Rx refills may take up to 3 business days. We ask that you follow-up with your pharmacy.

## 2024-05-16 MED ORDER — TRAZODONE HCL 150 MG PO TABS: 150.0000 mg | ORAL_TABLET | Freq: Every day | ORAL | 3 refills | Status: DC

## 2024-05-17 ENCOUNTER — Telehealth: Payer: Self-pay | Admitting: Adult Health

## 2024-05-17 NOTE — Telephone Encounter (Signed)
 Pt call and stated his RX for trazodone  [facility] and it should have [facility] and now he want it to go to [facility] because he is going out of town in 9 days.

## 2024-05-21 ENCOUNTER — Telehealth: Payer: Self-pay

## 2024-05-21 NOTE — Telephone Encounter (Signed)
 Copied from CRM [id number]. Topic: Clinical - Prescription Issue >> May 21, 2024  9:14 AM Elle L wrote: Reason for CRM: The patient requested that his prescription issue from 7/3 be disregarded as he transferred the traZODone  (DESYREL ) 150 MG tablet through his Pharmacy.

## 2024-05-22 NOTE — Telephone Encounter (Signed)
 Noted

## 2024-06-01 ENCOUNTER — Encounter: Payer: Self-pay | Admitting: Advanced Practice Midwife

## 2024-06-14 DIAGNOSIS — F331 Major depressive disorder, recurrent, moderate: Secondary | ICD-10-CM | POA: Diagnosis not present

## 2024-06-19 DIAGNOSIS — H401131 Primary open-angle glaucoma, bilateral, mild stage: Secondary | ICD-10-CM | POA: Diagnosis not present

## 2024-06-19 DIAGNOSIS — L570 Actinic keratosis: Secondary | ICD-10-CM | POA: Diagnosis not present

## 2024-06-19 DIAGNOSIS — H43813 Vitreous degeneration, bilateral: Secondary | ICD-10-CM | POA: Diagnosis not present

## 2024-06-19 DIAGNOSIS — H35033 Hypertensive retinopathy, bilateral: Secondary | ICD-10-CM | POA: Diagnosis not present

## 2024-06-19 DIAGNOSIS — H40053 Ocular hypertension, bilateral: Secondary | ICD-10-CM | POA: Diagnosis not present

## 2024-06-19 DIAGNOSIS — D044 Carcinoma in situ of skin of scalp and neck: Secondary | ICD-10-CM | POA: Diagnosis not present

## 2024-06-20 ENCOUNTER — Encounter (HOSPITAL_COMMUNITY): Payer: Self-pay | Admitting: *Deleted

## 2024-06-20 ENCOUNTER — Telehealth (HOSPITAL_COMMUNITY): Payer: Self-pay | Admitting: *Deleted

## 2024-06-20 NOTE — Telephone Encounter (Signed)
 Stress test instructions sent via [employer].

## 2024-06-26 ENCOUNTER — Ambulatory Visit: Admitting: Family Medicine

## 2024-06-26 ENCOUNTER — Encounter: Payer: Self-pay | Admitting: Family Medicine

## 2024-06-26 DIAGNOSIS — Z0000 Encounter for general adult medical examination without abnormal findings: Secondary | ICD-10-CM | POA: Diagnosis not present

## 2024-06-26 NOTE — Patient Instructions (Signed)
 I really enjoyed getting to talk with you today! I am available on Tuesdays and Thursdays for virtual visits if you have any questions or concerns, or if I can be of any further assistance.   CHECKLIST FROM ANNUAL WELLNESS VISIT:  -Follow up (please call to schedule if not scheduled after visit):   -as needed  Here is a list of your preventive care/health maintenance measures and the plan for each if any are due:  PLAN For any measures below that may be due:    1. Can get the vaccines at the pharmacy, please let us  know when you do so that we can update your record.   Health Maintenance  Topic Date Due   COVID-19 Vaccine (6 - Pfizer risk 2024-25 season) 07/12/2024 (Originally 03/19/2024)   INFLUENZA VACCINE  02/12/2025 (Originally 06/15/2024)   Medicare Annual Wellness (AWV)  06/26/2025   Colonoscopy  09/11/2028   DTaP/Tdap/Td (4 - Td or Tdap) 06/24/2032   Pneumococcal Vaccine: 50+ Years  Completed   Hepatitis C Screening  Completed   Zoster Vaccines- Shingrix  Completed   Hepatitis B Vaccines  Aged Out   HPV VACCINES  Aged Out   Meningococcal B Vaccine  Aged Out    -See a dentist at least yearly  -Get your eyes checked and then per your eye specialist's recommendations  -Other issues addressed today:   -I have included below further information regarding a healthy whole foods based diet, physical activity guidelines for adults, stress management and opportunities for social connections. I hope you find this information useful.   -----------------------------------------------------------------------------------------------------------------------------------------------------------------------------------------------------------------------------------------------------------    NUTRITION: -eat real food: lots of colorful vegetables (half the plate) and fruits -5-7 servings of vegetables and fruits per day (fresh or steamed is best), exp. 2 servings of vegetables with lunch  and dinner and 2 servings of fruit per day. Berries and greens such as kale and collards are great choices.  -consume on a regular basis:  fresh fruits, fresh veggies, fish, nuts, seeds, healthy oils (such as olive oil, avocado oil), whole grains (make sure for bread/pasta/crackers/etc., that the first ingredient on label contains the word whole), legumes. -can eat small amounts of dairy and lean meat (no larger than the palm of your hand), but avoid processed meats such as ham, bacon, lunch meat, etc. -drink water -try to avoid fast food and pre-packaged foods, processed meat, ultra processed foods/beverages (donuts, candy, etc.) -most experts advise limiting sodium to < 2300mg  per day, should limit further is any chronic conditions such as high blood pressure, heart disease, diabetes, etc. The American Heart Association advised that < 1500mg  is is ideal -try to avoid foods/beverages that contain any ingredients with names you do not recognize  -try to avoid foods/beverages  with added sugar or sweeteners/sweets  -try to avoid sweet drinks (including diet drinks): soda, juice, Gatorade, sweet tea, power drinks, diet drinks -try to avoid white rice, white bread, pasta (unless whole grain)  EXERCISE GUIDELINES FOR ADULTS: -if you wish to increase your physical activity, do so gradually and with the approval of your doctor -STOP and seek medical care immediately if you have any chest pain, chest discomfort or trouble breathing when starting or increasing exercise  -move and stretch your body, legs, feet and arms when sitting for long periods -Physical activity guidelines for optimal health in adults: -get at least 150 minutes per week of moderate exercise (can talk, but not sing); this is about 20-30 minutes of sustained activity 5-7 days per week  or two 10-15 minute episodes of sustained activity 5-7 days per week -do some muscle building/resistance training/strength training at least 2 days per  week  -balance exercises 3+ days per week:   Stand somewhere where you have something sturdy to hold onto if you lose balance    1) lift up on toes, then back down, start with 5x per day and work up to 20x   2) stand and lift one leg straight out to the side so that foot is a few inches of the floor, start with 5x each side and work up to 20x each side   3) stand on one foot, start with 5 seconds each side and work up to 20 seconds on each side  If you need ideas or help with getting more active:  -Silver sneakers https://tools.silversneakers.com  -Walk with a Doc: http://www.duncan-[email or web address]/  -try to include resistance (weight lifting/strength building) and balance exercises twice per week: or the following link for ideas: http://castillo-powell.com/  BuyDucts.dk  STRESS MANAGEMENT: -can try meditating, or just sitting quietly with deep breathing while intentionally relaxing all parts of your body for 5 minutes daily -if you need further help with stress, anxiety or depression please follow up with your primary doctor or contact the wonderful folks at [facility]: [phone]  SOCIAL CONNECTIONS: -options in [place] if you wish to engage in more social and exercise related activities:  -Silver sneakers https://tools.silversneakers.com  -Walk with a Doc: http://www.duncan-williams.com/  -Check out the [employer] Active Adults 50+ section on the [place] [employer] (hiking clubs, book clubs, cards and games, chess, exercise classes, aquatic classes and much more) - see the website for details: https://www.[place].gov/departments/parks-recreation/active-adults50  -YouTube has lots of exercise videos for different ages and abilities as well  -[facility] (a variety of indoor and outdoor inperson activities for adults). ([phone]. [address]  -Virtual Online Classes (a variety of topics): see seniorplanet.org or call [phone]  -consider volunteering at a school, hospice center, church, senior center or elsewhere
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
## 2024-07-03 ENCOUNTER — Other Ambulatory Visit: Payer: Self-pay | Admitting: Cardiology

## 2024-07-03 DIAGNOSIS — L988 Other specified disorders of the skin and subcutaneous tissue: Secondary | ICD-10-CM | POA: Diagnosis not present

## 2024-07-03 DIAGNOSIS — I2510 Atherosclerotic heart disease of native coronary artery without angina pectoris: Secondary | ICD-10-CM

## 2024-07-03 DIAGNOSIS — C44712 Basal cell carcinoma of skin of right lower limb, including hip: Secondary | ICD-10-CM | POA: Diagnosis not present

## 2024-07-03 DIAGNOSIS — D0471 Carcinoma in situ of skin of right lower limb, including hip: Secondary | ICD-10-CM | POA: Diagnosis not present

## 2024-07-03 DIAGNOSIS — Z01810 Encounter for preprocedural cardiovascular examination: Secondary | ICD-10-CM

## 2024-07-04 DIAGNOSIS — F331 Major depressive disorder, recurrent, moderate: Secondary | ICD-10-CM | POA: Diagnosis not present

## 2024-07-05 ENCOUNTER — Ambulatory Visit: Payer: Self-pay | Admitting: Cardiology

## 2024-07-05 ENCOUNTER — Ambulatory Visit (HOSPITAL_COMMUNITY)
Admission: RE | Admit: 2024-07-05 | Discharge: 2024-07-05 | Disposition: A | Discharge status: Home / Self Care | Source: Ambulatory Visit | Attending: Cardiology | Admitting: Cardiology

## 2024-07-05 DIAGNOSIS — I2510 Atherosclerotic heart disease of native coronary artery without angina pectoris: Secondary | ICD-10-CM

## 2024-07-05 DIAGNOSIS — Z01810 Encounter for preprocedural cardiovascular examination: Secondary | ICD-10-CM

## 2024-07-05 LAB — MYOCARDIAL PERFUSION IMAGING
Angina Index: 0
Duke Treadmill Score: 6
Estimated workload: 7
Exercise duration (min): 6 min
Exercise duration (sec): 0 s
LV dias vol: 57 mL (ref 62–150)
LV sys vol: 19 mL (ref 4.2–5.8)
MPHR: 143 {beats}/min
Nuc Stress EF: 67 %
Peak HR: 131 {beats}/min
Percent HR: 91 %
RPE: 18
Rest HR: 68 {beats}/min
Rest Nuclear Isotope Dose: 10.2 mCi
SDS: 2
SRS: 8
SSS: 7
ST Depression (mm): 0 mm
Stress Nuclear Isotope Dose: 32.4 mCi
TID: 0.94

## 2024-07-05 LAB — ECHOCARDIOGRAM COMPLETE
Area-P 1/2: 2.94 cm2
S' Lateral: 3.4 cm

## 2024-07-05 MED ORDER — TECHNETIUM TC 99M TETROFOSMIN IV KIT
10.2000 | PACK | Freq: Once | INTRAVENOUS | Status: AC | PRN
  Administered 2024-07-05: 10.2 via INTRAVENOUS

## 2024-07-05 MED ORDER — PERFLUTREN LIPID MICROSPHERE
2.0000 mL | INTRAVENOUS | Status: AC | PRN
  Administered 2024-07-05: 2 mL via INTRAVENOUS

## 2024-07-05 MED ORDER — TECHNETIUM TC 99M TETROFOSMIN IV KIT
32.4000 | PACK | Freq: Once | INTRAVENOUS | Status: AC | PRN
  Administered 2024-07-05: 32.4 via INTRAVENOUS

## 2024-07-12 ENCOUNTER — Other Ambulatory Visit (HOSPITAL_COMMUNITY)

## 2024-07-13 ENCOUNTER — Encounter (HOSPITAL_COMMUNITY)

## 2024-07-13 ENCOUNTER — Ambulatory Visit: Payer: Self-pay | Admitting: Cardiology

## 2024-07-17 ENCOUNTER — Other Ambulatory Visit (HOSPITAL_COMMUNITY)

## 2024-07-17 DIAGNOSIS — L570 Actinic keratosis: Secondary | ICD-10-CM | POA: Diagnosis not present

## 2024-07-18 ENCOUNTER — Encounter: Payer: Self-pay | Admitting: Cardiology

## 2024-07-18 ENCOUNTER — Ambulatory Visit: Attending: Cardiology | Admitting: Cardiology

## 2024-07-18 ENCOUNTER — Encounter: Payer: Self-pay | Admitting: Adult Health

## 2024-07-18 VITALS — BP 120/58 | HR 64 | Resp 16 | Ht 70.0 in | Wt 211.2 lb

## 2024-07-18 DIAGNOSIS — Z01818 Encounter for other preprocedural examination: Secondary | ICD-10-CM

## 2024-07-18 DIAGNOSIS — I10 Essential (primary) hypertension: Secondary | ICD-10-CM

## 2024-07-18 DIAGNOSIS — I7143 Infrarenal abdominal aortic aneurysm, without rupture: Secondary | ICD-10-CM

## 2024-07-18 DIAGNOSIS — E7800 Pure hypercholesterolemia, unspecified: Secondary | ICD-10-CM

## 2024-07-18 DIAGNOSIS — Z955 Presence of coronary angioplasty implant and graft: Secondary | ICD-10-CM | POA: Diagnosis not present

## 2024-07-18 DIAGNOSIS — I2510 Atherosclerotic heart disease of native coronary artery without angina pectoris: Secondary | ICD-10-CM

## 2024-07-18 DIAGNOSIS — Z951 Presence of aortocoronary bypass graft: Secondary | ICD-10-CM | POA: Diagnosis not present

## 2024-07-18 NOTE — Progress Notes (Signed)
 Cardiology Office Note:  .   ID:  Dave Robles, DOB 03/20/1946, MRN [id number] PCP:  Dave Huxley, NP  Former Cardiology Providers: Dave Robles, Dr. Hobart [facility] Providers Cardiologist:  Dave Large, DO , [facility] (established care 02/29/24) Electrophysiologist:  None  Click to update primary MD,subspecialty MD or APP then REFRESH:1}    Chief Complaint  Patient presents with   Coronary artery disease involving native coronary artery of   Follow-up    History of Present Illness: Dave Robles   Dave Robles is a 78 y.o. Caucasian male whose past medical history and cardiovascular risk factors includes: hx of CAD with original CABG x 3 with LIMA to LAD, SVG to DX and SVG to distal RCA in 1991 by Dr. Tanda, RE-DO CABG in July 2021 (free right IMA to OM and Left radial graft to PDA) with Dr. Fried, s/p PCI to SVG to RCA,  HTN, HLD, ED, past tobacco abuse, asthma, AAA (last measured in 08/2023 - 3.7 cm) and depression   Formally under the care of Dr. Hobart who last saw Dave Robles back in May 2024.   Patient has had a long cardiovascular history and based on the progress note from Dr. Hobart dated May 2024 the following is of reiteration of his clinical course.  Per review of the record, the patient had a NSTEMI in 2016 while vacationing in [place] .  Cath showed normal left main, 90% prox LAD, 99% mid LAD with patent LIMA to mid LAD and patent SVG to DX. LCX origin stenosis vs flow void? Fistula vs competitive flow in branch?, 100% RCA, SVG to RCA 99% origin treated with Xience DES and PTCA.   Patient has been closely followed by Dave Robles. Was seen in 04/2020 with worsening dyspnea on exertion which is his anginal equivalent. Was referred for cardiac cath at that time which led to need for repeat CABG on 05/27/20. Specifically, he received a left radial artery to PDA, and a free right internal mammary artery to obtuse marginal.  Post-op course complicated by need  for inotropic support and blood loss anemia requiring blood transfusion. Repeat TTE 03/04/21 with EF 60-65, no RWMA, mild LVH, GR 1 DD, normal RVSF, moderate LAE, mild AV sclerosis without stenosis.  Patient was last in the office in April 2025 at that time patient informed that he was physically active with activities such as cardiovascular workout and strength training classes twice a week, hiking, stationary bike, elliptical and treadmill.  He had plans of undergoing left knee replacement in October 2025 with Dr. Nelwyn Robles in [place]  sports medicine clinic.  At the last office visit shared decision was to proceed with echocardiogram and stress test prior to an elective procedure given his cardiovascular history.  He presents today for follow-up.  Echo and nuclear stress test results reviewed with him in detail and mentioned below for further reference.  Since last office visit patient denies any anginal chest pain or heart failure symptoms.  No hospitalizations or urgent care visits for cardiovascular reasons.  He has been compliant with his medical therapy.  Has lost 11 pounds since the last office visit due to lifestyle changes. Physical endurance remains stable.   Review of Systems: .   Review of Systems  Cardiovascular:  Negative for chest pain, claudication, irregular heartbeat, leg swelling, near-syncope, orthopnea, palpitations, paroxysmal nocturnal dyspnea and syncope.  Respiratory:  Negative for shortness of breath.   Hematologic/Lymphatic: Negative for bleeding problem.    Studies Reviewed:  EKG: EKG Interpretation Date/Time:  Wednesday July 18 2024 16:13:44 EDT Ventricular Rate:  65 PR Interval:  152 QRS Duration:  86 QT Interval:  432 QTC Calculation: 449 R Axis:   43  Text Interpretation: Sinus rhythm with occasional Premature ventricular complexes Consider Inferior infarct , age undetermined When compared with ECG of 29-Feb-2024 10:26, Premature ventricular  complexes are now Present QT has lengthened Confirmed by Dave Robles [phone]) on 07/18/2024 4:19:05 PM  Echocardiogram: 02/2021: LVEF 60-65%, grade 1 diastolic dysfunction, no significant valvular heart disease.  06/2024  1. Left ventricular ejection fraction, by estimation, is 55 to 60%. The  left ventricle has normal function. The left ventricle has no regional  wall motion abnormalities. Left ventricular diastolic parameters were  normal. No LV thrombus noted.   2. Right ventricular systolic function is normal. The right ventricular  size is normal.   3. The mitral valve is normal in structure. No evidence of mitral valve  regurgitation. No evidence of mitral stenosis.   4. The aortic valve is tricuspid. There is mild calcification of the  aortic valve. Aortic valve regurgitation is not visualized. Aortic valve  sclerosis/calcification is present, without any evidence of aortic  stenosis.   5. The inferior vena cava is normal in size with greater than 50%  respiratory variability, suggesting right atrial pressure of 3 mmHg.   Nuclear stress test 07/05/2024:   The study is normal. The study is low risk.   No ST deviation was noted.   LV perfusion is abnormal. Defect 1: There is a small defect with mild reduction in uptake present in the mid inferolateral location(s) that is fixed. There is normal wall motion in the defect area. Consistent with artifact.   Left ventricular function is normal. Nuclear stress EF: 67%. The left ventricular ejection fraction is hyperdynamic (>65%). End diastolic cavity size is normal.   Exercised for 6 min and 0 sec. Maximum HR of 131 bpm. MPHR 91.0%. Peak METS 7.0.   Prior study not available for comparison. Radiology Overread  1. Right lower lobe consolidation with volume loss, likelyatelectasis. 2. Aortic atherosclerosis.   Cath 05/13/20: Total occlusion of the mid LAD, total occlusion of the mid RCA, and high-grade calcified obstruction in the ostial to  proximal circumflex with diffuse 50% left main stenosis. Patent saphenous vein graft to the diagonal with 70 to 80% stenosis distal to the graft insertion site.  The diagonal fills retrogradely into the first septal perforator and the proximal LAD segment. Diffuse 95% in-stent restenosis in the ostial to proximal saphenous vein graft to the distal RCA. Normal LV function.  LVEDP is normal.  EF is estimated to be 60%.   RECOMMENDATIONS:   Critical decision point for this patient who had coronary bypass surgery in 1991, and now has diffuse in-stent restenosis in the ostium and proximal segment of the saphenous vein graft to the right coronary.  The native circumflex is heavily calcified and has critical stenosis.  The diagonal beyond the saphenous vein graft insertion site has significant disease.  Long-term patency of the right coronary graft will be markedly reduced PCI and restenting.  The technical standpoint it appears that the stent extends outside the ostium of the bypass graft.  The right coronary territory is Robles.  The patient is relatively healthy.  His best long-term approach may be repeat coronary bypass surgery.  Before starting down the path of stent implantation, will  refer for heart team evaluation to consider surgical revascularization.  If deemed  not a surgical candidate or if the patient prefers PCI, this could be attempted but will carry higher technical risk and likelihood of repeat revascularization  Vascular ultrasounds: Abdominal Aortic Duplex 08/18/2023: Abdominal Aorta: There is evidence of abnormal dilatation of the mid and distal Abdominal aorta. The largest aortic measurement is 3.7 cm. Ectatic left common iliac artery, with stable dimensions at 1.6 cm. The largest aortic diameter remains essentially  unchanged compared to prior exam. Previous diameter measurement was 3.7 cm obtained on 08/17/2022. Dr. Ladona recommended a repeat abdominal aortic duplex in 2 years (see  progress note dated 09/04/2023)  RADIOLOGY: CTA CHEST IMPRESSION 09/2020: 1. Mild likely consistent with very mild colitis and/or diverticulitis involving the mid to distal descending colon. 2. 3.5 cm x 3.2 cm focal aneurysmal dilatation of the infrarenal abdominal aorta. 3. Robles right pleural effusion. 4. Mild-to-moderate severity right basilar atelectasis and/or infiltrate. 5. Total right hip replacement. 6. Aortic atherosclerosis.   Aortic Atherosclerosis (ICD10-I70.0).  Risk Assessment/Calculations:   NA   Labs:       Latest Ref Rng & Units 03/21/2024    9:01 AM 03/15/2023    8:20 AM 02/09/2022    8:09 AM  CBC  WBC 4.0 - 10.5 K/uL 7.5  8.1  8.1   Hemoglobin 13.0 - 17.0 g/dL 83.5  83.9  83.7   Hematocrit 39.0 - 52.0 % 48.8  47.1  47.8   Platelets 150.0 - 400.0 K/uL 160.0  159.0  180.0        Latest Ref Rng & Units 03/21/2024    9:01 AM 04/05/2023    8:19 AM 03/15/2023    8:20 AM  BMP  Glucose 70 - 99 mg/dL 96  94  89   BUN 6 - 23 mg/dL 13  14  18    Creatinine 0.40 - 1.50 mg/dL 8.71  8.73  8.57   Sodium 135 - 145 mEq/L 139  139  140   Potassium 3.5 - 5.1 mEq/L 4.0  3.8  3.9   Chloride 96 - 112 mEq/L 102  102  101   CO2 19 - 32 mEq/L 30  27  31    Calcium  8.4 - 10.5 mg/dL 9.5  9.5  9.6       Latest Ref Rng & Units 03/21/2024    9:01 AM 04/05/2023    8:19 AM 03/15/2023    8:20 AM  CMP  Glucose 70 - 99 mg/dL 96  94  89   BUN 6 - 23 mg/dL 13  14  18    Creatinine 0.40 - 1.50 mg/dL 8.71  8.73  8.57   Sodium 135 - 145 mEq/L 139  139  140   Potassium 3.5 - 5.1 mEq/L 4.0  3.8  3.9   Chloride 96 - 112 mEq/L 102  102  101   CO2 19 - 32 mEq/L 30  27  31    Calcium  8.4 - 10.5 mg/dL 9.5  9.5  9.6   Total Protein 6.0 - 8.3 g/dL 6.4   6.1   Total Bilirubin 0.2 - 1.2 mg/dL 1.0   1.1   Alkaline Phos 39 - 117 U/L 83   71   AST 0 - 37 U/L 25   17   ALT 0 - 53 U/L 26   16     Lab Results  Component Value Date   CHOL 94 03/21/2024   HDL 34.60 (L) 03/21/2024   LDLCALC 45  03/21/2024   LDLDIRECT 66.7 11/30/2011   TRIG 71.0  03/21/2024   CHOLHDL 3 03/21/2024   No results for input(s): LIPOA in the last 8760 hours. No components found for: NTPROBNP No results for input(s): PROBNP in the last 8760 hours. Recent Labs    03/21/24 0901  TSH 3.43    Physical Exam:    Today's Vitals   07/18/24 1539  BP: (!) 120/58  Pulse: 64  Resp: 16  SpO2: 93%  Weight: 211 lb 3.2 oz (95.8 kg)  Height: 5' 10 (1.778 m)   Body mass index is 30.3 kg/m. Wt Readings from Last 3 Encounters:  07/18/24 211 lb 3.2 oz (95.8 kg)  03/21/24 221 lb (100.2 kg)  02/29/24 222 lb 9.6 oz (101 kg)    Physical Exam  Constitutional: No distress.  hemodynamically stable  Neck: No JVD present.  Cardiovascular: Normal rate, regular rhythm, S1 normal and S2 normal. Exam reveals no gallop, no S3 and no S4.  No murmur heard. Pulmonary/Chest: Effort normal and breath sounds normal. No stridor. He has no wheezes. He has no rales.  Musculoskeletal:        General: No edema.     Cervical back: Neck supple.  Skin: Skin is warm.     Impression & Recommendation(s):  Impression:   ICD-10-CM   1. Coronary artery disease involving native coronary artery of native heart without angina pectoris  I25.10 EKG 12-Lead    2. Hx of CABG  Z95.1     3. Preoperative clearance  Z01.818 EKG 12-Lead    4. History of coronary angioplasty with insertion of stent  Z95.5     5. Infrarenal abdominal aortic aneurysm (AAA) without rupture (HCC)  I71.43     6. Pure hypercholesterolemia  E78.00     7. Essential hypertension  I10        Recommendation(s):  Coronary artery disease involving native coronary artery of native heart without angina pectoris Hx of CABG, redo CABG History of coronary angioplasty with insertion of stent Remains in symptomatic. EKG is nonischemic Overall functional capacity remains stable Echo and stress test results from August 2024 I reviewed with the patient at  today's office visit and summarized above. Patient implementing lifestyle changes and has lost 10 pounds since April 2025, congratulated on his efforts. No significant use of sublingual nitroglycerin  tablets. Continue Lipitor  80 mg p.o. nightly. Continue Zetia  10 mg p.o. daily Continue Lasix  20 mg p.o. as needed basis. Continue hydrochlorothiazide  12.5 mg p.o. daily. Continue lisinopril  20 mg p.o. daily. Continue Toprol -XL 25 mg p.o. daily  Preoperative risk stratification. Being considered for left knee replacement. Denies anginal chest pain or heart failure symptoms. No recent hospitalizations for cardiovascular reasons. Is able to perform more than 4 METS of activity level based on daily activity and exercise capacity Echocardiogram notes preserved LVEF and no significant valvular heart disease Nuclear stress test reported to be low risk Overall acceptable risk for upcoming noncardiac surgery. Recommended holding Plavix  75 mg p.o. daily 7 days prior to surgery and restart once cleared by orthopedic surgery  Infrarenal abdominal aortic aneurysm (AAA) without rupture [facility]) Last vascular ultrasound October 2024. Recommended 2-year follow-up study tentatively scheduled for April 2026. Clinically remains asymptomatic. Patient is aware to seek medical attention by going to the closest ER via EMS if he has symptoms concerning for aortic syndromes.  Pure hypercholesterolemia Most recent lipid profile from May 2025 independently reviewed. LDL at goal, 45 mg/dL Continue Lipitor  and Zetia   Essential hypertension Office blood pressures are very well-controlled. Medications discussed above  Orders  Placed:  Orders Placed This Encounter  Procedures   EKG 12-Lead     Final Medication List:   No orders of the defined types were placed in this encounter.   There are no discontinued medications.   Current Outpatient Medications:    acetaminophen  (TYLENOL ) 325 MG tablet, Take 650 mg by  mouth every 6 (six) hours as needed for moderate pain or headache. , Disp: , Rfl:    albuterol (VENTOLIN HFA) 108 (90 Base) MCG/ACT inhaler, as needed., Disp: , Rfl:    atorvastatin  (LIPITOR ) 80 MG tablet, TAKE 1 TABLET DAILY, Disp: 90 tablet, Rfl: 3   azelastine  (ASTELIN ) 0.1 % nasal spray, Place 1 spray into both nostrils 2 (two) times daily. Use in each nostril as directed, Disp: 30 mL, Rfl: 12   buPROPion  (WELLBUTRIN  XL) 300 MG 24 hr tablet, TAKE 1 TABLET BY MOUTH DAILY, Disp: 90 tablet, Rfl: 3   chlorhexidine  (PERIDEX ) 0.12 % solution, FLOSS, BRUSH AND RINSE WITH 1 CAPFUL AND SPIT, Disp: , Rfl:    clopidogrel  (PLAVIX ) 75 MG tablet, Take 1 tablet (75 mg total) by mouth daily., Disp: 90 tablet, Rfl: 3   cyclobenzaprine  (FLEXERIL ) 10 MG tablet, Take 1 tablet (10 mg total) by mouth at bedtime., Disp: 15 tablet, Rfl: 0   ezetimibe  (ZETIA ) 10 MG tablet, Take 1 tablet (10 mg total) by mouth daily., Disp: 90 tablet, Rfl: 3   furosemide  (LASIX ) 20 MG tablet, TAKE 1 TABLET DAILY AS     NEEDED FOR LEG SWELLING, Disp: 90 tablet, Rfl: 1   hydrochlorothiazide  (HYDRODIURIL ) 25 MG tablet, Take 0.5 tablets (12.5 mg total) by mouth daily., Disp: 45 tablet, Rfl: 3   lisinopril  (ZESTRIL ) 20 MG tablet, Take 1 tablet (20 mg total) by mouth daily., Disp: 90 tablet, Rfl: 3   Melatonin 10 MG TABS, Take 20 mg by mouth at bedtime., Disp: , Rfl:    metoprolol  succinate (TOPROL -XL) 25 MG 24 hr tablet, Take 1 tablet (25 mg total) by mouth daily., Disp: 90 tablet, Rfl: 3   Multiple Vitamin (MULTIVITAMIN) capsule, Take 1 capsule by mouth daily., Disp: , Rfl:    nitroGLYCERIN  (NITROSTAT ) 0.4 MG SL tablet, Place 1 tablet (0.4 mg total) under the tongue every 5 (five) minutes as needed., Disp: 25 tablet, Rfl: 0   sildenafil  (VIAGRA ) 100 MG tablet, Take 100 mg by mouth daily as needed for erectile dysfunction., Disp: , Rfl:    traZODone  (DESYREL ) 150 MG tablet, Take 1 tablet (150 mg total) by mouth at bedtime., Disp: 90 tablet,  Rfl: 3  Consent:    NA   Disposition:   12 months follow-up sooner if needed  His questions and concerns were addressed to his satisfaction. He voices understanding of the recommendations provided during this encounter.    Signed, Dave Dave HAS, [facility]  A Division of [facility] [address] [place], [place] [address]

## 2024-07-18 NOTE — Patient Instructions (Signed)
 Medication Instructions:  Your physician recommends that you continue on your current medications as directed. Please refer to the Current Medication list given to you today.  *If you need a refill on your cardiac medications before your next appointment, please call your pharmacy*   Follow-Up: At [facility], you and your health needs are our priority.  As part of our continuing mission to provide you with exceptional heart care, our providers are all part of one team.  This team includes your primary Cardiologist (physician) and Advanced Practice Providers or APPs (Physician Assistants and Nurse Practitioners) who all work together to provide you with the care you need, when you need it.  Your next appointment:   12 month(s)  Provider:   Madonna Large, DO

## 2024-07-19 DIAGNOSIS — Z961 Presence of intraocular lens: Secondary | ICD-10-CM | POA: Diagnosis not present

## 2024-07-19 DIAGNOSIS — H26493 Other secondary cataract, bilateral: Secondary | ICD-10-CM | POA: Diagnosis not present

## 2024-07-19 DIAGNOSIS — F331 Major depressive disorder, recurrent, moderate: Secondary | ICD-10-CM | POA: Diagnosis not present

## 2024-07-24 ENCOUNTER — Other Ambulatory Visit

## 2024-07-24 ENCOUNTER — Ambulatory Visit: Admitting: Adult Health

## 2024-07-24 ENCOUNTER — Other Ambulatory Visit: Payer: Self-pay

## 2024-07-24 ENCOUNTER — Encounter: Payer: Self-pay | Admitting: Adult Health

## 2024-07-24 VITALS — BP 100/52 | HR 57 | Temp 98.2°F | Ht 70.0 in | Wt 214.0 lb

## 2024-07-24 DIAGNOSIS — Z23 Encounter for immunization: Secondary | ICD-10-CM

## 2024-07-24 DIAGNOSIS — Z01818 Encounter for other preprocedural examination: Secondary | ICD-10-CM

## 2024-07-24 LAB — URINALYSIS
Bilirubin Urine: NEGATIVE
Hgb urine dipstick: NEGATIVE
Ketones, ur: NEGATIVE
Leukocytes,Ua: NEGATIVE
Nitrite: NEGATIVE
Specific Gravity, Urine: 1.01 (ref 1.000–1.030)
Total Protein, Urine: NEGATIVE
Urine Glucose: NEGATIVE
Urobilinogen, UA: 0.2 (ref 0.0–1.0)
pH: 6 (ref 5.0–8.0)

## 2024-07-24 LAB — CBC
HCT: 46.6 % (ref 39.0–52.0)
Hemoglobin: 15.7 g/dL (ref 13.0–17.0)
MCHC: 33.6 g/dL (ref 30.0–36.0)
MCV: 92.5 fl (ref 78.0–100.0)
Platelets: 135 K/uL — ABNORMAL LOW (ref 150.0–400.0)
RBC: 5.04 Mil/uL (ref 4.22–5.81)
RDW: 14.9 % (ref 11.5–15.5)
WBC: 7.4 K/uL (ref 4.0–10.5)

## 2024-07-24 LAB — HEMOGLOBIN A1C: Hgb A1c MFr Bld: 6.1 % (ref 4.6–6.5)

## 2024-07-24 MED ORDER — COVID-19 MRNA VACC (MODERNA) 50 MCG/0.5ML IM SUSP: 0.5000 mL | Freq: Once | INTRAMUSCULAR | 0 refills | Status: AC

## 2024-07-24 NOTE — Progress Notes (Signed)
 Subjective:    Patient ID: Dave Robles, male    DOB: 1946-05-23, 78 y.o.   MRN: [id number]  HPI 78 year old male who  has a past medical history of AAA (abdominal aortic aneurysm) (HCC), AAA (abdominal aortic aneurysm) without rupture (HCC) (01/19/2017), Adjustment disorder with depressed mood (03/04/2009), Arthritis, Colon polyps, CORONARY ARTERY DISEASE (07/10/2007), Coronary artery disease involving native coronary artery of native heart without angina pectoris (07/10/2007), Depression, DOE (dyspnea on exertion), Episodic mood disorder (HCC) (08/01/2014), ERECTILE DYSFUNCTION (07/10/2007), Extrinsic asthma, unspecified (09/10/2008), HYPERLIPIDEMIA (07/10/2007), HYPERTENSION (07/10/2007), Myocardial infarction (HCC) (2016), OA (osteoarthritis) of hip (04/19/2018), Onychomycosis (05/14/2014), Pain in joint of left knee (01/19/2017), Personal history of colonic polyps (07/08/2014), S/P CABG x 2 (05/27/2020), and TOBACCO USE (12/15/2009).  He presents to the office today for preop evaluation. He will be having a total knee replacement on 09/06/2024 while he is in [place] . This will be done by [facility].   He was seen by Cardiology last week  Echocardiogram notes preserved LVEF and no significant valvular heart disease Nuclear stress test reported to be low risk Stop Plavix  7 days prior to surgery.     Review of Systems See HPI   Past Medical History:  Diagnosis Date   AAA (abdominal aortic aneurysm) (HCC)    AAA (abdominal aortic aneurysm) without rupture (HCC) 01/19/2017   Adjustment disorder with depressed mood 03/04/2009   Arthritis    Colon polyps    CORONARY ARTERY DISEASE 07/10/2007   Coronary artery disease involving native coronary artery of native heart without angina pectoris 07/10/2007   Qualifier: Diagnosis of  By: Krystal, RN, Leeroy     Depression    DOE (dyspnea on exertion)    Episodic mood disorder (HCC) 08/01/2014   ERECTILE DYSFUNCTION 07/10/2007   Extrinsic asthma,  unspecified 09/10/2008   HYPERLIPIDEMIA 07/10/2007   HYPERTENSION 07/10/2007   Myocardial infarction (HCC) 2016   when had angioplasty- was told had a silent MI   OA (osteoarthritis) of hip 04/19/2018   Onychomycosis 05/14/2014   Pain in joint of left knee 01/19/2017   Personal history of colonic polyps 07/08/2014   1997, 2001 8 mm transverse serrated polyp right colon 2010 07/08/2014 no polyps - repeat colon 2022      S/P CABG x 2 05/27/2020   TOBACCO USE 12/15/2009    Social History   Socioeconomic History   Marital status: Married    Spouse name: Not on file   Number of children: Not on file   Years of education: Not on file   Highest education level: Bachelor's degree (e.g., BA, AB, BS)  Occupational History   Not on file  Tobacco Use   Smoking status: Former    Current packs/day: 0.00    Types: Cigarettes    Quit date: 1991    Years since quitting: 34.7   Smokeless tobacco: Never  Vaping Use   Vaping status: Never Used  Substance and Sexual Activity   Alcohol use: Yes    Alcohol/week: 1.0 standard drink of alcohol    Types: 1 Cans of beer per week    Comment: Gin 2 x week   Drug use: No   Sexual activity: Yes    Comment: regular exercise - yes  Other Topics Concern   Not on file  Social History Narrative   Retired from being an Network engineer of a small business    Married    No kids       He likes  to play golf and rides bike       Social Drivers of [employer]: Low Risk  (07/20/2024)   Overall Financial Resource Strain (CARDIA)    Difficulty of Paying Living Expenses: Not hard at all  Food Insecurity: No Food Insecurity (07/20/2024)   Hunger Vital Sign    Worried About Running Out of Food in the Last Year: Never true    Ran Out of Food in the Last Year: Never true  Transportation Needs: No Transportation Needs (07/20/2024)   PRAPARE - Administrator, Civil Service (Medical): No    Lack of Transportation (Non-Medical): No  Physical  Activity: Sufficiently Active (07/20/2024)   Exercise Vital Sign    Days of Exercise per Week: 5 days    Minutes of Exercise per Session: 70 min  Stress: No Stress Concern Present (07/20/2024)   [employer] of Occupational Health - Occupational Stress Questionnaire    Feeling of Stress: Not at all  Social Connections: Moderately Isolated (07/20/2024)   Social Connection and Isolation Panel    Frequency of Communication with Friends and Family: More than three times a week    Frequency of Social Gatherings with Friends and Family: Once a week    Attends Religious Services: Never    Database administrator or Organizations: No    Attends Engineer, structural: Not on file    Marital Status: Married  Catering manager Violence: Not At Risk (03/11/2023)   Humiliation, Afraid, Rape, and Kick questionnaire    Fear of Current or Ex-Partner: No    Emotionally Abused: No    Physically Abused: No    Sexually Abused: No    Past Surgical History:  Procedure Laterality Date   COLONOSCOPY     CORONARY ANGIOPLASTY  12/2014   CORONARY ARTERY BYPASS GRAFT  11/15/89   5 vessels   CORONARY ARTERY BYPASS GRAFT N/A 05/27/2020   Procedure: REDO CORONARY ARTERY BYPASS GRAFTING (CABG), ON PUMP, TIMES TWO, USING RIGHT INTERNAL MAMMARY ARTERY AND LEFT RADIAL ARTERY (OPEN);  Surgeon: Army Dallas NOVAK, MD;  Location: [facility] OR;  Service: Open Heart Surgery;  Laterality: N/A;  Left Radial Artery to PDA FREE RIMA to OM   HYDROCELE EXCISION / REPAIR  2011   INGUINAL HERNIA REPAIR Left 1985   LEFT HEART CATH AND CORS/GRAFTS ANGIOGRAPHY N/A 05/13/2020   Procedure: LEFT HEART CATH AND CORS/GRAFTS ANGIOGRAPHY;  Surgeon: Claudene Victory ORN, MD;  Location: [facility] INVASIVE CV LAB;  Service: Cardiovascular;  Laterality: N/A;   MEDIASTERNOTOMY N/A 05/27/2020   Procedure: MEDIAN STERNOTOMY;  Surgeon: Army Dallas NOVAK, MD;  Location[facility] OR;  Service: Open Heart Surgery;  Laterality: N/A;  Redo Median Sternotomy   RADIAL ARTERY  HARVEST Left 05/27/2020   Procedure: RADIAL ARTERY HARVEST;  Surgeon: Army Dallas NOVAK, MD;  Location: [facility] OR;  Service: Open Heart Surgery;  Laterality: Left;   TEE WITHOUT CARDIOVERSION N/A 05/27/2020   Procedure: TRANSESOPHAGEAL ECHOCARDIOGRAM (TEE);  Surgeon: Army Dallas NOVAK, MD;  Location: [facility] OR;  Service: Open Heart Surgery;  Laterality: N/A;   TOTAL HIP ARTHROPLASTY Right 04/19/2018   Procedure: RIGHT TOTAL HIP ARTHROPLASTY ANTERIOR APPROACH;  Surgeon: Melodi Lerner, MD;  Location: [facility] ORS;  Service: Orthopedics;  Laterality: Right;    Family History  Problem Relation Age of Onset   Coronary artery disease Father        cabg   Cancer Mother        Breast and or  lung    Depression Mother    Hyperlipidemia Other    Hypertension Other    Depression Paternal Grandfather    Colon cancer Neg Hx     No Known Allergies  Current Outpatient Medications on File Prior to Visit  Medication Sig Dispense Refill   acetaminophen  (TYLENOL ) 325 MG tablet Take 650 mg by mouth every 6 (six) hours as needed for moderate pain or headache.      albuterol (VENTOLIN HFA) 108 (90 Base) MCG/ACT inhaler as needed.     atorvastatin  (LIPITOR ) 80 MG tablet TAKE 1 TABLET DAILY 90 tablet 3   azelastine  (ASTELIN ) 0.1 % nasal spray Place 1 spray into both nostrils 2 (two) times daily. Use in each nostril as directed 30 mL 12   buPROPion  (WELLBUTRIN  XL) 300 MG 24 hr tablet TAKE 1 TABLET BY MOUTH DAILY 90 tablet 3   chlorhexidine  (PERIDEX ) 0.12 % solution FLOSS, BRUSH AND RINSE WITH 1 CAPFUL AND SPIT     clopidogrel  (PLAVIX ) 75 MG tablet Take 1 tablet (75 mg total) by mouth daily. 90 tablet 3   cyclobenzaprine  (FLEXERIL ) 10 MG tablet Take 1 tablet (10 mg total) by mouth at bedtime. 15 tablet 0   ezetimibe  (ZETIA ) 10 MG tablet Take 1 tablet (10 mg total) by mouth daily. 90 tablet 3   furosemide  (LASIX ) 20 MG tablet TAKE 1 TABLET DAILY AS     NEEDED FOR LEG SWELLING 90 tablet 1   hydrochlorothiazide  (HYDRODIURIL )  25 MG tablet Take 0.5 tablets (12.5 mg total) by mouth daily. 45 tablet 3   lisinopril  (ZESTRIL ) 20 MG tablet Take 1 tablet (20 mg total) by mouth daily. 90 tablet 3   Melatonin 10 MG TABS Take 20 mg by mouth at bedtime.     metoprolol  succinate (TOPROL -XL) 25 MG 24 hr tablet Take 1 tablet (25 mg total) by mouth daily. 90 tablet 3   Multiple Vitamin (MULTIVITAMIN) capsule Take 1 capsule by mouth daily.     nitroGLYCERIN  (NITROSTAT ) 0.4 MG SL tablet Place 1 tablet (0.4 mg total) under the tongue every 5 (five) minutes as needed. 25 tablet 0   sildenafil  (VIAGRA ) 100 MG tablet Take 100 mg by mouth daily as needed for erectile dysfunction.     traZODone  (DESYREL ) 150 MG tablet Take 1 tablet (150 mg total) by mouth at bedtime. 90 tablet 3   No current facility-administered medications on file prior to visit.    BP (!) 100/52   Pulse (!) 57   Temp 98.2 F (36.8 C) (Oral)   Ht 5' 10 (1.778 m)   Wt 214 lb (97.1 kg)   SpO2 97%   BMI 30.71 kg/m       Objective:   Physical Exam Vitals and nursing note reviewed.  Constitutional:      Appearance: Normal appearance.  Cardiovascular:     Rate and Rhythm: Normal rate and regular rhythm.     Pulses: Normal pulses.     Heart sounds: Normal heart sounds.  Pulmonary:     Effort: Pulmonary effort is normal.     Breath sounds: Normal breath sounds.  Musculoskeletal:        General: Normal range of motion.  Skin:    General: Skin is warm and dry.  Neurological:     General: No focal deficit present.     Mental Status: He is alert and oriented to person, place, and time.  Psychiatric:        Mood and Affect: Mood normal.  Behavior: Behavior normal.        Thought Content: Thought content normal.        Judgment: Judgment normal.       Assessment & Plan:  1. Pre-operative clearance (Primary) - Will do required labs and if normal will clear for surgery.  - CBC; Future - Basic Metabolic Panel; Future - Protime-INR; Future -  Urinalysis; Future - Hemoglobin A1c; Future  2. Need for influenza vaccination  - Flu vaccine HIGH DOSE PF(Fluzone Trivalent)  3. Need for COVID-19 vaccine  - COVID-19 mRNA vaccine, Moderna, >/= 26yrs, (SPIKEVAX) injection; Inject 0.5 mLs into the muscle once for 1 dose.  Dispense: 0.5 mL; Refill: 0  Darleene Shape, NP  I personally spent a total of 32 minutes in the care of the patient today including preparing to see the patient, getting/reviewing separately obtained history, performing a medically appropriate exam/evaluation, counseling and educating, placing orders, documenting clinical information in the EHR, and communicating results.

## 2024-07-25 LAB — PROTIME-INR
INR: 1.1
Prothrombin Time: 11.4 s (ref 9.0–11.5)

## 2024-07-26 ENCOUNTER — Other Ambulatory Visit (INDEPENDENT_AMBULATORY_CARE_PROVIDER_SITE_OTHER)

## 2024-07-26 DIAGNOSIS — Z01818 Encounter for other preprocedural examination: Secondary | ICD-10-CM | POA: Diagnosis not present

## 2024-07-26 LAB — BASIC METABOLIC PANEL WITH GFR
BUN: 13 mg/dL (ref 6–23)
CO2: 28 meq/L (ref 19–32)
Calcium: 9.4 mg/dL (ref 8.4–10.5)
Chloride: 103 meq/L (ref 96–112)
Creatinine, Ser: 1.34 mg/dL (ref 0.40–1.50)
GFR: 51.23 mL/min — ABNORMAL LOW (ref >60.00)
Glucose, Bld: 95 mg/dL (ref 70–99)
Potassium: 3.8 meq/L (ref 3.5–5.1)
Sodium: 139 meq/L (ref 135–145)

## 2024-07-31 ENCOUNTER — Telehealth: Payer: Self-pay | Admitting: *Deleted

## 2024-07-31 NOTE — Telephone Encounter (Signed)
 Tried to call pt to get fax number but no answer. Did pt discuss this with you and leave a fax number by any chance?

## 2024-07-31 NOTE — Telephone Encounter (Signed)
 Copied from CRM [id number]. Topic: Clinical - Lab/Test Results >> Jul 31, 2024  2:35 PM Dedra B wrote: Reason for CRM: Pt is calling to follow up on his request to have his lab results sent to Dr. Nelwyn Schneider at [facility]. Pt said he checked with Dr. [place] office and they haven't received it. Pt would like a call regarding this matter.

## 2024-08-01 ENCOUNTER — Telehealth: Payer: Self-pay

## 2024-08-01 NOTE — Telephone Encounter (Signed)
 Pt notified that this was already taking care of. Pt stated that he will call the office to make sure they were able to get forms.

## 2024-08-01 NOTE — Telephone Encounter (Signed)
 Copied from CRM [id number]. Topic: Clinical - Lab/Test Results >> Jul 31, 2024  2:35 PM Dedra B wrote: Reason for CRM: Pt is calling to follow up on his request to have his lab results sent to Dr. Nelwyn Schneider at [facility]. Pt said he checked with Dr. [place] office and they haven't received it. Pt would like a call regarding this matter. >> Jul 31, 2024  5:01 PM [place] J wrote: Patient is calling to follow up on his request. I let the patient know that Darleene has not gotten back with an update or reviewed his lab results. Patient acknowledge and will be waiting for a follow up.

## 2024-08-07 ENCOUNTER — Other Ambulatory Visit: Payer: Self-pay | Admitting: Adult Health

## 2024-08-07 ENCOUNTER — Encounter: Payer: Self-pay | Admitting: Adult Health

## 2024-08-07 MED ORDER — TRAZODONE HCL 150 MG PO TABS
150.0000 mg | ORAL_TABLET | Freq: Every day | ORAL | 1 refills | Status: AC
Start: 2024-08-07 — End: ?

## 2024-08-07 NOTE — Telephone Encounter (Signed)
**Note De-identified  [place] Obfuscation** Please advise 

## 2024-08-09 DIAGNOSIS — F331 Major depressive disorder, recurrent, moderate: Secondary | ICD-10-CM | POA: Diagnosis not present

## 2024-08-18 ENCOUNTER — Other Ambulatory Visit: Payer: Self-pay | Admitting: Adult Health

## 2024-08-18 DIAGNOSIS — F339 Major depressive disorder, recurrent, unspecified: Secondary | ICD-10-CM

## 2024-08-27 DIAGNOSIS — K080 Exfoliation of teeth due to systemic causes: Secondary | ICD-10-CM | POA: Diagnosis not present

## 2024-08-31 DIAGNOSIS — Z951 Presence of aortocoronary bypass graft: Secondary | ICD-10-CM | POA: Diagnosis not present

## 2024-08-31 DIAGNOSIS — M1712 Unilateral primary osteoarthritis, left knee: Secondary | ICD-10-CM | POA: Diagnosis not present

## 2024-08-31 DIAGNOSIS — E669 Obesity, unspecified: Secondary | ICD-10-CM | POA: Diagnosis not present

## 2024-08-31 DIAGNOSIS — Z7901 Long term (current) use of anticoagulants: Secondary | ICD-10-CM | POA: Diagnosis not present

## 2024-09-06 DIAGNOSIS — Z7902 Long term (current) use of antithrombotics/antiplatelets: Secondary | ICD-10-CM | POA: Diagnosis not present

## 2024-09-06 DIAGNOSIS — I10 Essential (primary) hypertension: Secondary | ICD-10-CM | POA: Diagnosis not present

## 2024-09-06 DIAGNOSIS — F39 Unspecified mood [affective] disorder: Secondary | ICD-10-CM | POA: Diagnosis not present

## 2024-09-06 DIAGNOSIS — J45909 Unspecified asthma, uncomplicated: Secondary | ICD-10-CM | POA: Diagnosis not present

## 2024-09-06 DIAGNOSIS — Z87891 Personal history of nicotine dependence: Secondary | ICD-10-CM | POA: Diagnosis not present

## 2024-09-06 DIAGNOSIS — Z79899 Other long term (current) drug therapy: Secondary | ICD-10-CM | POA: Diagnosis not present

## 2024-09-06 DIAGNOSIS — E7800 Pure hypercholesterolemia, unspecified: Secondary | ICD-10-CM | POA: Diagnosis not present

## 2024-09-06 DIAGNOSIS — E785 Hyperlipidemia, unspecified: Secondary | ICD-10-CM | POA: Diagnosis not present

## 2024-09-06 DIAGNOSIS — G4700 Insomnia, unspecified: Secondary | ICD-10-CM | POA: Diagnosis not present

## 2024-09-06 DIAGNOSIS — Z96652 Presence of left artificial knee joint: Secondary | ICD-10-CM | POA: Diagnosis not present

## 2024-09-06 DIAGNOSIS — G8918 Other acute postprocedural pain: Secondary | ICD-10-CM | POA: Diagnosis not present

## 2024-09-06 DIAGNOSIS — Z951 Presence of aortocoronary bypass graft: Secondary | ICD-10-CM | POA: Diagnosis not present

## 2024-09-06 DIAGNOSIS — I2510 Atherosclerotic heart disease of native coronary artery without angina pectoris: Secondary | ICD-10-CM | POA: Diagnosis not present

## 2024-09-06 DIAGNOSIS — M109 Gout, unspecified: Secondary | ICD-10-CM | POA: Diagnosis not present

## 2024-09-06 DIAGNOSIS — Z7982 Long term (current) use of aspirin: Secondary | ICD-10-CM | POA: Diagnosis not present

## 2024-09-06 DIAGNOSIS — M1712 Unilateral primary osteoarthritis, left knee: Secondary | ICD-10-CM | POA: Diagnosis not present

## 2024-09-06 DIAGNOSIS — E669 Obesity, unspecified: Secondary | ICD-10-CM | POA: Diagnosis not present

## 2024-09-06 DIAGNOSIS — Z6831 Body mass index (BMI) 31.0-31.9, adult: Secondary | ICD-10-CM | POA: Diagnosis not present

## 2024-09-07 DIAGNOSIS — I10 Essential (primary) hypertension: Secondary | ICD-10-CM | POA: Diagnosis not present

## 2024-09-07 DIAGNOSIS — I2510 Atherosclerotic heart disease of native coronary artery without angina pectoris: Secondary | ICD-10-CM | POA: Diagnosis not present

## 2024-09-07 DIAGNOSIS — Z6831 Body mass index (BMI) 31.0-31.9, adult: Secondary | ICD-10-CM | POA: Diagnosis not present

## 2024-09-07 DIAGNOSIS — Z96652 Presence of left artificial knee joint: Secondary | ICD-10-CM | POA: Diagnosis not present

## 2024-09-07 DIAGNOSIS — F39 Unspecified mood [affective] disorder: Secondary | ICD-10-CM | POA: Diagnosis not present

## 2024-09-07 DIAGNOSIS — M1712 Unilateral primary osteoarthritis, left knee: Secondary | ICD-10-CM | POA: Diagnosis not present

## 2024-09-07 DIAGNOSIS — Z79899 Other long term (current) drug therapy: Secondary | ICD-10-CM | POA: Diagnosis not present

## 2024-09-07 DIAGNOSIS — M109 Gout, unspecified: Secondary | ICD-10-CM | POA: Diagnosis not present

## 2024-09-07 DIAGNOSIS — Z951 Presence of aortocoronary bypass graft: Secondary | ICD-10-CM | POA: Diagnosis not present

## 2024-09-07 DIAGNOSIS — G4700 Insomnia, unspecified: Secondary | ICD-10-CM | POA: Diagnosis not present

## 2024-09-07 DIAGNOSIS — E7800 Pure hypercholesterolemia, unspecified: Secondary | ICD-10-CM | POA: Diagnosis not present

## 2024-09-07 DIAGNOSIS — Z7902 Long term (current) use of antithrombotics/antiplatelets: Secondary | ICD-10-CM | POA: Diagnosis not present

## 2024-09-07 DIAGNOSIS — Z7982 Long term (current) use of aspirin: Secondary | ICD-10-CM | POA: Diagnosis not present

## 2024-09-07 DIAGNOSIS — Z87891 Personal history of nicotine dependence: Secondary | ICD-10-CM | POA: Diagnosis not present

## 2024-09-07 DIAGNOSIS — E669 Obesity, unspecified: Secondary | ICD-10-CM | POA: Diagnosis not present

## 2024-09-10 DIAGNOSIS — Z4889 Encounter for other specified surgical aftercare: Secondary | ICD-10-CM | POA: Diagnosis not present

## 2024-09-10 DIAGNOSIS — M25562 Pain in left knee: Secondary | ICD-10-CM | POA: Diagnosis not present

## 2024-09-10 DIAGNOSIS — R2689 Other abnormalities of gait and mobility: Secondary | ICD-10-CM | POA: Diagnosis not present

## 2024-09-12 DIAGNOSIS — M25562 Pain in left knee: Secondary | ICD-10-CM | POA: Diagnosis not present

## 2024-09-12 DIAGNOSIS — R2689 Other abnormalities of gait and mobility: Secondary | ICD-10-CM | POA: Diagnosis not present

## 2024-09-12 DIAGNOSIS — Z4889 Encounter for other specified surgical aftercare: Secondary | ICD-10-CM | POA: Diagnosis not present

## 2024-09-14 DIAGNOSIS — M25562 Pain in left knee: Secondary | ICD-10-CM | POA: Diagnosis not present

## 2024-09-14 DIAGNOSIS — R2689 Other abnormalities of gait and mobility: Secondary | ICD-10-CM | POA: Diagnosis not present

## 2024-09-14 DIAGNOSIS — Z4889 Encounter for other specified surgical aftercare: Secondary | ICD-10-CM | POA: Diagnosis not present

## 2024-09-17 DIAGNOSIS — Z4889 Encounter for other specified surgical aftercare: Secondary | ICD-10-CM | POA: Diagnosis not present

## 2024-09-17 DIAGNOSIS — M25562 Pain in left knee: Secondary | ICD-10-CM | POA: Diagnosis not present

## 2024-09-17 DIAGNOSIS — R2689 Other abnormalities of gait and mobility: Secondary | ICD-10-CM | POA: Diagnosis not present

## 2024-09-21 DIAGNOSIS — R2689 Other abnormalities of gait and mobility: Secondary | ICD-10-CM | POA: Diagnosis not present

## 2024-09-21 DIAGNOSIS — Z4889 Encounter for other specified surgical aftercare: Secondary | ICD-10-CM | POA: Diagnosis not present

## 2024-09-21 DIAGNOSIS — M25562 Pain in left knee: Secondary | ICD-10-CM | POA: Diagnosis not present

## 2024-09-24 DIAGNOSIS — R2689 Other abnormalities of gait and mobility: Secondary | ICD-10-CM | POA: Diagnosis not present

## 2024-09-24 DIAGNOSIS — M25562 Pain in left knee: Secondary | ICD-10-CM | POA: Diagnosis not present

## 2024-09-24 DIAGNOSIS — Z4889 Encounter for other specified surgical aftercare: Secondary | ICD-10-CM | POA: Diagnosis not present

## 2024-09-26 DIAGNOSIS — M25562 Pain in left knee: Secondary | ICD-10-CM | POA: Diagnosis not present

## 2024-09-26 DIAGNOSIS — Z4889 Encounter for other specified surgical aftercare: Secondary | ICD-10-CM | POA: Diagnosis not present

## 2024-09-26 DIAGNOSIS — R2689 Other abnormalities of gait and mobility: Secondary | ICD-10-CM | POA: Diagnosis not present

## 2024-09-28 DIAGNOSIS — Z4889 Encounter for other specified surgical aftercare: Secondary | ICD-10-CM | POA: Diagnosis not present

## 2024-09-28 DIAGNOSIS — M25562 Pain in left knee: Secondary | ICD-10-CM | POA: Diagnosis not present

## 2024-09-28 DIAGNOSIS — R2689 Other abnormalities of gait and mobility: Secondary | ICD-10-CM | POA: Diagnosis not present

## 2024-10-01 DIAGNOSIS — Z4889 Encounter for other specified surgical aftercare: Secondary | ICD-10-CM | POA: Diagnosis not present

## 2024-10-01 DIAGNOSIS — R2689 Other abnormalities of gait and mobility: Secondary | ICD-10-CM | POA: Diagnosis not present

## 2024-10-01 DIAGNOSIS — M25562 Pain in left knee: Secondary | ICD-10-CM | POA: Diagnosis not present

## 2024-10-05 DIAGNOSIS — Z4889 Encounter for other specified surgical aftercare: Secondary | ICD-10-CM | POA: Diagnosis not present

## 2024-10-05 DIAGNOSIS — M25562 Pain in left knee: Secondary | ICD-10-CM | POA: Diagnosis not present

## 2024-10-05 DIAGNOSIS — R2689 Other abnormalities of gait and mobility: Secondary | ICD-10-CM | POA: Diagnosis not present

## 2024-10-08 DIAGNOSIS — R2689 Other abnormalities of gait and mobility: Secondary | ICD-10-CM | POA: Diagnosis not present

## 2024-10-08 DIAGNOSIS — M25562 Pain in left knee: Secondary | ICD-10-CM | POA: Diagnosis not present

## 2024-10-08 DIAGNOSIS — Z4889 Encounter for other specified surgical aftercare: Secondary | ICD-10-CM | POA: Diagnosis not present

## 2024-10-10 DIAGNOSIS — Z4889 Encounter for other specified surgical aftercare: Secondary | ICD-10-CM | POA: Diagnosis not present

## 2024-10-10 DIAGNOSIS — R2689 Other abnormalities of gait and mobility: Secondary | ICD-10-CM | POA: Diagnosis not present

## 2024-10-10 DIAGNOSIS — M25562 Pain in left knee: Secondary | ICD-10-CM | POA: Diagnosis not present

## 2024-10-12 DIAGNOSIS — R2689 Other abnormalities of gait and mobility: Secondary | ICD-10-CM | POA: Diagnosis not present

## 2024-10-12 DIAGNOSIS — Z4889 Encounter for other specified surgical aftercare: Secondary | ICD-10-CM | POA: Diagnosis not present

## 2024-10-12 DIAGNOSIS — M25562 Pain in left knee: Secondary | ICD-10-CM | POA: Diagnosis not present

## 2024-10-15 DIAGNOSIS — Z4889 Encounter for other specified surgical aftercare: Secondary | ICD-10-CM | POA: Diagnosis not present

## 2024-10-15 DIAGNOSIS — M25562 Pain in left knee: Secondary | ICD-10-CM | POA: Diagnosis not present

## 2024-10-15 DIAGNOSIS — R2689 Other abnormalities of gait and mobility: Secondary | ICD-10-CM | POA: Diagnosis not present

## 2024-10-17 DIAGNOSIS — Z4889 Encounter for other specified surgical aftercare: Secondary | ICD-10-CM | POA: Diagnosis not present

## 2024-10-17 DIAGNOSIS — M25562 Pain in left knee: Secondary | ICD-10-CM | POA: Diagnosis not present

## 2024-10-17 DIAGNOSIS — Z9889 Other specified postprocedural states: Secondary | ICD-10-CM | POA: Diagnosis not present

## 2024-10-17 DIAGNOSIS — R2689 Other abnormalities of gait and mobility: Secondary | ICD-10-CM | POA: Diagnosis not present

## 2024-10-17 DIAGNOSIS — Z96652 Presence of left artificial knee joint: Secondary | ICD-10-CM | POA: Diagnosis not present

## 2024-10-22 DIAGNOSIS — M25562 Pain in left knee: Secondary | ICD-10-CM | POA: Diagnosis not present

## 2024-10-22 DIAGNOSIS — R2689 Other abnormalities of gait and mobility: Secondary | ICD-10-CM | POA: Diagnosis not present

## 2024-10-22 DIAGNOSIS — Z4889 Encounter for other specified surgical aftercare: Secondary | ICD-10-CM | POA: Diagnosis not present

## 2024-10-24 DIAGNOSIS — R2689 Other abnormalities of gait and mobility: Secondary | ICD-10-CM | POA: Diagnosis not present

## 2024-10-24 DIAGNOSIS — M25562 Pain in left knee: Secondary | ICD-10-CM | POA: Diagnosis not present

## 2024-10-24 DIAGNOSIS — Z4889 Encounter for other specified surgical aftercare: Secondary | ICD-10-CM | POA: Diagnosis not present

## 2024-10-27 ENCOUNTER — Encounter: Payer: Self-pay | Admitting: Adult Health

## 2024-10-27 ENCOUNTER — Encounter: Payer: Self-pay | Admitting: Cardiology

## 2024-10-27 DIAGNOSIS — I10 Essential (primary) hypertension: Secondary | ICD-10-CM

## 2024-10-27 DIAGNOSIS — E7800 Pure hypercholesterolemia, unspecified: Secondary | ICD-10-CM

## 2024-10-27 DIAGNOSIS — I714 Abdominal aortic aneurysm, without rupture, unspecified: Secondary | ICD-10-CM

## 2024-10-27 DIAGNOSIS — Z951 Presence of aortocoronary bypass graft: Secondary | ICD-10-CM

## 2024-10-27 DIAGNOSIS — I2510 Atherosclerotic heart disease of native coronary artery without angina pectoris: Secondary | ICD-10-CM

## 2024-10-27 DIAGNOSIS — E782 Mixed hyperlipidemia: Secondary | ICD-10-CM

## 2024-10-27 DIAGNOSIS — Z79899 Other long term (current) drug therapy: Secondary | ICD-10-CM

## 2024-10-29 MED ORDER — LISINOPRIL 20 MG PO TABS: 20.0000 mg | ORAL_TABLET | Freq: Every day | ORAL | 3 refills | Status: AC

## 2024-10-29 MED ORDER — EZETIMIBE 10 MG PO TABS: 10.0000 mg | ORAL_TABLET | Freq: Every day | ORAL | 3 refills | Status: AC

## 2024-10-29 MED ORDER — CLOPIDOGREL BISULFATE 75 MG PO TABS: 75.0000 mg | ORAL_TABLET | Freq: Every day | ORAL | 3 refills | Status: AC

## 2024-10-29 MED ORDER — ATORVASTATIN CALCIUM 80 MG PO TABS: ORAL_TABLET | ORAL | 3 refills | Status: AC

## 2024-10-29 MED ORDER — HYDROCHLOROTHIAZIDE 25 MG PO TABS: 12.5000 mg | ORAL_TABLET | Freq: Every day | ORAL | 3 refills | Status: AC

## 2024-10-29 MED ORDER — METOPROLOL SUCCINATE ER 25 MG PO TB24: 25.0000 mg | ORAL_TABLET | Freq: Every day | ORAL | 3 refills | Status: AC

## 2024-10-31 DIAGNOSIS — Z4889 Encounter for other specified surgical aftercare: Secondary | ICD-10-CM | POA: Diagnosis not present

## 2024-10-31 DIAGNOSIS — M25562 Pain in left knee: Secondary | ICD-10-CM | POA: Diagnosis not present

## 2024-10-31 DIAGNOSIS — R2689 Other abnormalities of gait and mobility: Secondary | ICD-10-CM | POA: Diagnosis not present

## 2024-11-01 ENCOUNTER — Other Ambulatory Visit: Payer: Self-pay | Admitting: Adult Health

## 2024-11-01 DIAGNOSIS — F339 Major depressive disorder, recurrent, unspecified: Secondary | ICD-10-CM

## 2024-11-01 MED ORDER — TRAZODONE HCL 150 MG PO TABS: 150.0000 mg | ORAL_TABLET | Freq: Every day | ORAL | 1 refills | Status: AC

## 2024-11-01 MED ORDER — BUPROPION HCL ER (XL) 300 MG PO TB24: 300.0000 mg | ORAL_TABLET | Freq: Every day | ORAL | 1 refills | Status: AC

## 2024-11-02 DIAGNOSIS — Z4889 Encounter for other specified surgical aftercare: Secondary | ICD-10-CM | POA: Diagnosis not present

## 2024-11-02 DIAGNOSIS — M25562 Pain in left knee: Secondary | ICD-10-CM | POA: Diagnosis not present

## 2024-11-02 DIAGNOSIS — R2689 Other abnormalities of gait and mobility: Secondary | ICD-10-CM | POA: Diagnosis not present

## 2024-11-12 DIAGNOSIS — R2689 Other abnormalities of gait and mobility: Secondary | ICD-10-CM | POA: Diagnosis not present

## 2024-11-12 DIAGNOSIS — M25562 Pain in left knee: Secondary | ICD-10-CM | POA: Diagnosis not present

## 2024-11-12 DIAGNOSIS — Z4889 Encounter for other specified surgical aftercare: Secondary | ICD-10-CM | POA: Diagnosis not present

## 2025-02-18 ENCOUNTER — Other Ambulatory Visit (HOSPITAL_COMMUNITY)
